# Patient Record
Sex: Female | Born: 1955 | Race: Black or African American | Hispanic: No | Smoking: Former smoker
Health system: Southern US, Community
[De-identification: ages and names within clinical notes are randomized; demographics above are authoritative.]

## PROBLEM LIST (undated history)

## (undated) DIAGNOSIS — F17200 Nicotine dependence, unspecified, uncomplicated: Secondary | ICD-10-CM

## (undated) DIAGNOSIS — M1990 Unspecified osteoarthritis, unspecified site: Secondary | ICD-10-CM

## (undated) DIAGNOSIS — M4800 Spinal stenosis, site unspecified: Secondary | ICD-10-CM

## (undated) DIAGNOSIS — I10 Essential (primary) hypertension: Secondary | ICD-10-CM

## (undated) DIAGNOSIS — K635 Polyp of colon: Secondary | ICD-10-CM

## (undated) DIAGNOSIS — K219 Gastro-esophageal reflux disease without esophagitis: Secondary | ICD-10-CM

## (undated) DIAGNOSIS — M7989 Other specified soft tissue disorders: Secondary | ICD-10-CM

## (undated) DIAGNOSIS — M79609 Pain in unspecified limb: Secondary | ICD-10-CM

## (undated) DIAGNOSIS — E669 Obesity, unspecified: Secondary | ICD-10-CM

## (undated) HISTORY — DX: Spinal stenosis, site unspecified: M48.00

## (undated) HISTORY — DX: Obesity, unspecified: E66.9

## (undated) HISTORY — DX: Other specified soft tissue disorders: M79.89

## (undated) HISTORY — DX: Polyp of colon: K63.5

## (undated) HISTORY — DX: Gastro-esophageal reflux disease without esophagitis: K21.9

## (undated) HISTORY — DX: Unspecified osteoarthritis, unspecified site: M19.90

## (undated) HISTORY — DX: Essential (primary) hypertension: I10

## (undated) HISTORY — PX: POLYPECTOMY: SHX149

## (undated) HISTORY — DX: Pain in unspecified limb: M79.609

## (undated) HISTORY — DX: Nicotine dependence, unspecified, uncomplicated: F17.200

---

## 2007-12-02 HISTORY — PX: COLONOSCOPY: SHX174

## 2008-03-23 ENCOUNTER — Other Ambulatory Visit: Admission: RE | Admit: 2008-03-23 | Discharge: 2008-03-23 | Payer: Self-pay | Admitting: Family Medicine

## 2008-03-24 ENCOUNTER — Ambulatory Visit: Payer: Self-pay | Admitting: Family Medicine

## 2008-04-11 ENCOUNTER — Ambulatory Visit: Payer: Self-pay | Admitting: Internal Medicine

## 2008-04-11 ENCOUNTER — Encounter: Payer: Self-pay | Admitting: Internal Medicine

## 2008-04-13 ENCOUNTER — Ambulatory Visit (HOSPITAL_COMMUNITY): Admission: RE | Admit: 2008-04-13 | Discharge: 2008-04-13 | Payer: Self-pay | Admitting: Family Medicine

## 2008-04-18 ENCOUNTER — Encounter: Payer: Self-pay | Admitting: Internal Medicine

## 2008-04-18 ENCOUNTER — Ambulatory Visit: Payer: Self-pay | Admitting: Internal Medicine

## 2008-04-18 LAB — HM COLONOSCOPY: HM Colonoscopy: 7

## 2008-04-20 ENCOUNTER — Encounter: Payer: Self-pay | Admitting: Internal Medicine

## 2009-03-30 ENCOUNTER — Ambulatory Visit: Payer: Self-pay | Admitting: Family Medicine

## 2009-07-20 ENCOUNTER — Ambulatory Visit: Payer: Self-pay | Admitting: Family Medicine

## 2009-07-31 ENCOUNTER — Emergency Department (HOSPITAL_COMMUNITY): Admission: EM | Admit: 2009-07-31 | Discharge: 2009-07-31 | Payer: Self-pay | Admitting: Family Medicine

## 2009-08-07 ENCOUNTER — Ambulatory Visit: Payer: Self-pay | Admitting: Family Medicine

## 2009-08-16 ENCOUNTER — Ambulatory Visit: Payer: Self-pay | Admitting: Family Medicine

## 2009-10-11 ENCOUNTER — Ambulatory Visit: Payer: Self-pay | Admitting: Family Medicine

## 2009-11-16 ENCOUNTER — Ambulatory Visit: Payer: Self-pay | Admitting: Family Medicine

## 2010-12-22 ENCOUNTER — Encounter: Payer: Self-pay | Admitting: Neurosurgery

## 2011-05-10 ENCOUNTER — Encounter: Payer: Self-pay | Admitting: Internal Medicine

## 2011-06-09 ENCOUNTER — Emergency Department (HOSPITAL_COMMUNITY): Payer: Self-pay

## 2011-06-09 ENCOUNTER — Emergency Department (HOSPITAL_COMMUNITY)
Admission: EM | Admit: 2011-06-09 | Discharge: 2011-06-09 | Disposition: A | Discharge status: Home / Self Care | Payer: Self-pay | Attending: Emergency Medicine | Admitting: Emergency Medicine

## 2011-06-09 DIAGNOSIS — R071 Chest pain on breathing: Secondary | ICD-10-CM | POA: Insufficient documentation

## 2011-06-09 DIAGNOSIS — M79609 Pain in unspecified limb: Secondary | ICD-10-CM

## 2011-06-09 DIAGNOSIS — Z79899 Other long term (current) drug therapy: Secondary | ICD-10-CM | POA: Insufficient documentation

## 2011-06-09 DIAGNOSIS — M7989 Other specified soft tissue disorders: Secondary | ICD-10-CM | POA: Insufficient documentation

## 2011-06-09 LAB — POCT I-STAT, CHEM 8
BUN: 23 mg/dL (ref 6–23)
Calcium, Ion: 1.11 mmol/L — ABNORMAL LOW (ref 1.12–1.32)
Chloride: 106 meq/L (ref 96–112)
Creatinine, Ser: 1 mg/dL (ref 0.50–1.10)
Glucose, Bld: 112 mg/dL — ABNORMAL HIGH (ref 70–99)
HCT: 43 % (ref 36.0–46.0)
Hemoglobin: 14.6 g/dL (ref 12.0–15.0)
Potassium: 4.2 meq/L (ref 3.5–5.1)
Sodium: 140 mEq/L (ref 135–145)
TCO2: 25 mmol/L (ref 0–100)

## 2011-06-09 LAB — D-DIMER, QUANTITATIVE: D-Dimer, Quant: 0.35 ug{FEU}/mL (ref 0.00–0.48)

## 2011-06-18 ENCOUNTER — Encounter: Payer: Self-pay | Admitting: Internal Medicine

## 2011-06-24 ENCOUNTER — Emergency Department (HOSPITAL_COMMUNITY)
Admission: EM | Admit: 2011-06-24 | Discharge: 2011-06-24 | Disposition: A | Discharge status: Home / Self Care | Payer: Self-pay | Attending: Emergency Medicine | Admitting: Emergency Medicine

## 2011-06-24 DIAGNOSIS — M25473 Effusion, unspecified ankle: Secondary | ICD-10-CM | POA: Insufficient documentation

## 2011-06-24 DIAGNOSIS — L03119 Cellulitis of unspecified part of limb: Secondary | ICD-10-CM | POA: Insufficient documentation

## 2011-06-24 DIAGNOSIS — M7989 Other specified soft tissue disorders: Secondary | ICD-10-CM | POA: Insufficient documentation

## 2011-06-24 DIAGNOSIS — J45909 Unspecified asthma, uncomplicated: Secondary | ICD-10-CM | POA: Insufficient documentation

## 2011-06-24 DIAGNOSIS — L02419 Cutaneous abscess of limb, unspecified: Secondary | ICD-10-CM | POA: Insufficient documentation

## 2011-06-24 DIAGNOSIS — R209 Unspecified disturbances of skin sensation: Secondary | ICD-10-CM | POA: Insufficient documentation

## 2011-06-24 DIAGNOSIS — L299 Pruritus, unspecified: Secondary | ICD-10-CM | POA: Insufficient documentation

## 2011-06-24 DIAGNOSIS — M25476 Effusion, unspecified foot: Secondary | ICD-10-CM | POA: Insufficient documentation

## 2011-07-01 ENCOUNTER — Emergency Department (HOSPITAL_COMMUNITY): Payer: Self-pay

## 2011-07-01 ENCOUNTER — Observation Stay (HOSPITAL_COMMUNITY)
Admission: EM | Admit: 2011-07-01 | Discharge: 2011-07-02 | Disposition: A | Discharge status: Home / Self Care | Payer: Self-pay | Attending: Emergency Medicine | Admitting: Emergency Medicine

## 2011-07-01 DIAGNOSIS — M7989 Other specified soft tissue disorders: Secondary | ICD-10-CM | POA: Insufficient documentation

## 2011-07-01 DIAGNOSIS — S8253XA Displaced fracture of medial malleolus of unspecified tibia, initial encounter for closed fracture: Principal | ICD-10-CM | POA: Insufficient documentation

## 2011-07-01 DIAGNOSIS — J45909 Unspecified asthma, uncomplicated: Secondary | ICD-10-CM | POA: Insufficient documentation

## 2011-07-01 DIAGNOSIS — R252 Cramp and spasm: Secondary | ICD-10-CM | POA: Insufficient documentation

## 2011-07-01 DIAGNOSIS — E119 Type 2 diabetes mellitus without complications: Secondary | ICD-10-CM | POA: Insufficient documentation

## 2011-07-01 DIAGNOSIS — I10 Essential (primary) hypertension: Secondary | ICD-10-CM | POA: Insufficient documentation

## 2011-07-01 DIAGNOSIS — W208XXA Other cause of strike by thrown, projected or falling object, initial encounter: Secondary | ICD-10-CM | POA: Insufficient documentation

## 2011-07-01 DIAGNOSIS — L02419 Cutaneous abscess of limb, unspecified: Secondary | ICD-10-CM | POA: Insufficient documentation

## 2011-07-01 DIAGNOSIS — Y998 Other external cause status: Secondary | ICD-10-CM | POA: Insufficient documentation

## 2011-07-01 DIAGNOSIS — Y92009 Unspecified place in unspecified non-institutional (private) residence as the place of occurrence of the external cause: Secondary | ICD-10-CM | POA: Insufficient documentation

## 2011-07-01 DIAGNOSIS — E669 Obesity, unspecified: Secondary | ICD-10-CM | POA: Insufficient documentation

## 2011-07-01 LAB — BASIC METABOLIC PANEL
Calcium: 9.4 mg/dL (ref 8.4–10.5)
Creatinine, Ser: 1 mg/dL (ref 0.50–1.10)
GFR calc Af Amer: >60 mL/min (ref >60)
GFR calc non Af Amer: 57 mL/min — ABNORMAL LOW (ref >60)
Sodium: 138 mEq/L (ref 135–145)

## 2011-07-01 LAB — CBC
MCH: 31.2 pg (ref 26.0–34.0)
MCHC: 34.8 g/dL (ref 30.0–36.0)
Platelets: 320 10*3/uL (ref 150–400)
RBC: 4.49 MIL/uL (ref 3.87–5.11)
RDW: 12.8 % (ref 11.5–15.5)

## 2011-07-01 LAB — DIFFERENTIAL
Basophils Relative: 1 % (ref 0–1)
Eosinophils Absolute: 0.2 10*3/uL (ref 0.0–0.7)
Eosinophils Relative: 3 % (ref 0–5)
Monocytes Relative: 8 % (ref 3–12)
Neutrophils Relative %: 59 % (ref 43–77)

## 2011-07-01 LAB — C-REACTIVE PROTEIN: CRP: 0.5 mg/dL — ABNORMAL LOW (ref <0.6)

## 2011-07-01 LAB — SEDIMENTATION RATE: Sed Rate: 10 mm/hr (ref 0–22)

## 2011-07-02 LAB — BASIC METABOLIC PANEL
BUN: 18 mg/dL (ref 6–23)
CO2: 25 mEq/L (ref 19–32)
Calcium: 9.1 mg/dL (ref 8.4–10.5)
GFR calc non Af Amer: >60 mL/min (ref >60)
Glucose, Bld: 167 mg/dL — ABNORMAL HIGH (ref 70–99)
Potassium: 4.2 mEq/L (ref 3.5–5.1)

## 2011-07-02 LAB — LIPID PANEL
HDL: 44 mg/dL (ref >39)
LDL Cholesterol: 89 mg/dL (ref 0–99)
Triglycerides: 111 mg/dL (ref <150)
VLDL: 22 mg/dL (ref 0–40)

## 2011-07-02 LAB — CBC
HCT: 40.4 % (ref 36.0–46.0)
Hemoglobin: 13.6 g/dL (ref 12.0–15.0)
MCH: 30.5 pg (ref 26.0–34.0)
MCHC: 33.7 g/dL (ref 30.0–36.0)
RBC: 4.46 MIL/uL (ref 3.87–5.11)

## 2011-07-02 LAB — URINALYSIS, ROUTINE W REFLEX MICROSCOPIC
Bilirubin Urine: NEGATIVE
Ketones, ur: NEGATIVE mg/dL
Nitrite: NEGATIVE
Urobilinogen, UA: 0.2 mg/dL (ref 0.0–1.0)

## 2011-07-02 LAB — HEMOGLOBIN A1C: Hgb A1c MFr Bld: 7.3 % — ABNORMAL HIGH (ref <5.7)

## 2011-07-06 NOTE — Discharge Summary (Signed)
[name], [name]         ACCOUNT NO.:  [id number]  MEDICAL RECORD NO.:  [id number]  LOCATION:  5011                         FACILITY:  [facility]  PHYSICIAN:  Isidor Holts, M.D.  DATE OF BIRTH:  November 21, 1956  DATE OF ADMISSION:  07/01/2011 DATE OF DISCHARGE:  07/02/2011                              DISCHARGE SUMMARY   PRIMARY MD:  Sharlot Gowda, MD  DISCHARGE DIAGNOSES: 1. Avulsion fracture, medial malleolus left leg. 2. Mild left lower extremity cellulitis. 3. Bronchial asthma. 4. Obesity. 5. Newly-diagnosed type 2 diabetes mellitus. 6. Hypertension.  DISCHARGE MEDICATIONS: 1. Bactrim DS 1 tablet p.o. b.i.d. for 7 days. 2. Lisinopril 20 mg p.o. daily. 3. Metformin 500 mg p.o. b.i.d. 4. Vicodin (5/325) 1-2 tablets p.o. p.r.n. q.4 hourly for pain.  A     total of 42 pills have been dispensed.  Note:  Doxycycline has been discontinued.  PROCEDURES:  X-ray left ankle July 01, 2011.  This showed evulsion fracture from the inferior tip of the medial malleolus overlying soft tissue swelling suggest acute.  CONSULTATIONS:  Dr. Toni Arthurs, orthopedic surgeon.  PATIENT HISTORY:  As in H and P notes of July 01, 2011, dictated by Dr. Jeoffrey Massed.  However, in brief, this is a 55 year old female, with known history of bronchial asthma and morbid obesity, who presents with progressive swelling and  pain of the left ankle.  She was seen fairly recently in the emergency department and was told that she did not have a DVT.  She was discharged on 7 days of doxycycline, for possible cellulitis.  However this has continued to be progressive.  She re-presented to the emergency department.  X-ray of the ankle showed an avulsion fracture of the inferior tip of the left medial malleolus.  She was therefore admitted for further evaluation, investigation and management.  CLINICAL COURSE: 1. Mild left lower extremity cellulitis.  This was based on clinical     constellation of  signs, including swelling, pain, tenderness as well     as mild erythema.  The patient was commenced on intravenous     vancomycin.  However, by July 02, 2011, local inflammatory     phenomena had practically subsided.  She has been transitioned to     oral Bactrim DS, for a further 7 days of therapy.  2. Avulsion fracture, left medial malleolus.  This was an incidental     finding on x-ray done in the emergency department on July 01, 2011.     Dr. Victorino Dike, orthopedic surgeon kindly provided a consultation and     has recommended support, weightbearing as tolerated, as well as an     outpatient followup.  The patient has been placed on analgesics for     symptomatic control.  3. Bronchial asthma.  There were no issues referable to this.  4. Hypertension.  The patient was found to have elevated BP at the     time of presentation and by July 02, 2011, in spite of Norvasc, BP     was ranging between 156/61 to 186/74 mmHg.  She has been placed on     lisinopril accordingly.  5. Type 2 diabetes mellitus.  This is a new diagnosis.  The patient     was found to have elevated blood glucose of the order of 211 at the     time of presentation.  Hemoglobin A1c was 7.3 consistent with ADA     criteria for diagnosis of diabetes mellitus.  She has therefore been     diagnosed with type 2 diabetes mellitus, given dietary     recommendations, diabetic teaching and commenced on metformin in an     initial dose of 500 mg p.o. b.i.d.  Outpatient diabetic class has     been arranged.  DISPOSITION:  The patient as of July 02, 2011, felt considerably better, was very keen to go home.  There were no new issues.  She was therefore discharged accordingly.  ACTIVITY:  As tolerated.  Recommended to increase activity slowly.  DIET:  Heart healthy/carbohydrate modified.  FOLLOWUP INSTRUCTIONS:  The patient will follow up routinely with her primary MD per prior scheduled appointment.  In addition, she  will follow up with orthopedic surgeon Dr. Toni Arthurs, on a date to be determined.  Telephone number [phone].  The patient was instructed to call for an appointment and has verbalized understanding.  SPECIAL INSTRUCTIONS:  Diabetic teaching class has been estranged on outpatient basis.     Isidor Holts, M.D.     CO/MEDQ  D:  07/02/2011  T:  07/03/2011  Job:  [id number]  cc:   Sharlot Gowda, M.D. Toni Arthurs, MD  Electronically Signed by Isidor Holts M.D. on 07/06/2011 06:30:13 PM

## 2011-07-06 NOTE — Consult Note (Signed)
[name], [name]         ACCOUNT NO.:  [id number]  MEDICAL RECORD NO.:  [id number]  LOCATION:  5011                         FACILITY:  [facility]  PHYSICIAN:  Toni Arthurs, MD        DATE OF BIRTH:  [birth year]  DATE OF CONSULTATION:  07/02/2011 DATE OF DISCHARGE:  07/02/2011                                CONSULTATION   REASON FOR CONSULTATION:  Left ankle pain.  PRIMARY CARE PROVIDER:  Sharlot Gowda, MD  HISTORY OF PRESENT ILLNESS:  The patient is a 55 year old woman with past medical history significant for asthma and obesity who complains of left ankle pain and swelling.  She does not recall any specific injury aside from dropping a can of food on her left ankle.  She says that the ankle was sore at the lateral side of the ankle, but not so much on the medial side.  She urgently presented to the emergency room and had an ultrasound showing no signs of blood clot.  She points to the lateral ankle as the area of her worst pain.  This is worse when she stands or walks and is moderate pain that is sharp in the ankle.  It is less when she keeps it elevated.  She has not had any treatment to date.  She is admitted to the hospital for cellulitis of the left ankle.  She has never had any injury or surgery of this left ankle in the past.  PAST MEDICAL HISTORY:  Asthma and obesity.  PAST SURGICAL HISTORY:  None.  SOCIAL HISTORY:  The patient smokes.  She does not drink any alcohol. She is not working currently.  FAMILY HISTORY:  Positive for hypertension.  REVIEW OF SYSTEMS:  No recent fever, chills, nausea, vomiting, or change in her appetite.  REVIEW OF SYSTEMS:  As above and otherwise negative.  PHYSICAL EXAMINATION:  The patient is a well-nourished, well-developed woman in no apparent distress.  Alert and oriented x4.  Mood and affect normal.  Extraocular motions are intact.  Respirations are unlabored. Her left ankle is slightly swollen.  It is tender to palpation in  the tip of the medial malleolus.  She has palpable pulses and normal sensibility to light touch.  She has range of motion from neutral dorsiflexion to 45 degrees plantar flexion.  Strength is 5/5 in plantarflexion and dorsiflexion of the ankles.  It is intact.  There is no lymphadenopathy noted.  X-RAYS:  Three views of the left ankle are reviewed from the ER.  These show a tiny avulsion fracture from the tip of the medial malleolus and are otherwise normal.  ASSESSMENT:  Left ankle sprain.  PLAN:  Based on the patient's exam, her pain pattern, and x-rays were consistent with a left ankle sprain.  We will put her in a stroke brace and let her bear weight as tolerated.  She can wean out of this in about a week or so.  She can continue working on range of motion and strengthening.  She will follow up with her primary care doctor if these symptoms do not resolve in the next few weeks.     Toni Arthurs, MD     JH/MEDQ  D:  07/02/2011  T:  07/03/2011  Job:  [id number]  Electronically Signed by Jonny Ruiz Aquarius Latouche  on 07/06/2011 03:16:58 PM

## 2011-07-09 NOTE — H&P (Signed)
NAMEMarland Kitchen  [name], [name] NO.:  [id number]  MEDICAL RECORD NO.:  [id number]  LOCATION:  1857                         FACILITY:  [facility]  PHYSICIAN:  Jeoffrey Massed, MD    DATE OF BIRTH:  October 06, 1956  DATE OF ADMISSION:  07/01/2011 DATE OF DISCHARGE:                             HISTORY & PHYSICAL   PRIMARY CARE PRACTITIONER:  Sharlot Gowda, MD  CHIEF COMPLAINT:  Left leg swelling and pain.  HISTORY OF PRESENT ILLNESS:  Ms. [name] is a very pleasant 55- year-old female with a past medical history of obesity and bronchial asthma who claims that for the past month or so she has been having intermittent left ankle swelling.  Apparently, a month or so ago she dropped a can continuing food on the left ankle region.  Following that she has been having pain and swelling of the legs.  She claims that she presented to the ED earlier this month and was told that she did not have a clot in her leg and was discharged home with 7 days of doxycycline as they thought she might have a cellulitis.  However, she continued to have pain mostly in the top of ankle region.  Her swelling of the legs has worsened and she is now having erythema streaking up her left leg.  She denies any fever to me.  Denies any headache, shortness of breath, cough, abdominal pain, nausea, vomiting, diarrhea.  For the persistent ankle pain and swelling, she presented to the ED where x-rays of the ankle were done which shows an avulsion fracture of inferior tip of the medial malleolus with overlying soft tissue swelling suggesting acute age.  She is now been referred to the Hospitalist Service for management of cellulitic issues.  Please note that the ED physician has already spoken with orthopedic doctor on-call, Dr. Victorino Dike who will evaluate the patient either this evening or tomorrow morning.  ALLERGIES:  The patient denies any known drug allergies.  PAST MEDICAL HISTORY: 1. Asthma. 2.  Obesity.  PAST SURGICAL HISTORY:  None.  MEDICATIONS AT HOME:  Albuterol on a p.r.n. basis.  FAMILY HISTORY:  Noncontributory.  SOCIAL HISTORY:  The patient continues to smoke.  She recently quit but again restarted this last week.  She denies any other toxic habits.  REVIEW OF SYSTEMS:  A detailed review of 12 systems were done and these are negative except for ones noted in the HPI.  PHYSICAL EXAMINATION:  GENERAL:  Lying in bed, does not appear to be in any distress. VITAL SIGNS:  Temperature of 98.0, heart rate of 88, blood pressure 169/81, respiration of 20 and pulse ox 90% on room air. HEENT:  Atraumatic, normocephalic.  Pupils equally reactive to light and accommodation. NECK:  Supple. CHEST:  Bilaterally clear to auscultation. CARDIOVASCULAR:  Heart sounds are regular.  No murmurs heard. ABDOMEN:  Soft, nontender, nondistended. EXTREMITIES:  Her left leg does appear to be slightly erythematous when compared to the right leg.  This is extending up to her mid left leg. There is some mild edema along with this.  She does have some mild tenderness in the front of her ankles as well.  Her leg does not appear tense.  Her  sensation is intact, and she has 2+ pedal pulses bilaterally.  She has good capillary refill as well.  The left leg is not exquisitely tender as well. NEUROLOGIC:  The patient is awake, alert.  Speech is clear and she has no focal neurological deficit on exam.  LABORATORY DATA: 1. CBC shows a WBC of 8.4, hemoglobin of 14.0, hematocrit of 40.2 and     a platelet count of 320. 2. D-dimer 0.35. 3. BMET shows a sodium of 138, potassium of 4.0, chloride of 103,     bicarb of 25, glucose of 211, BUN of 20, creatinine of 1.0, and a     calcium of 9.4. 4. ESR is 10.  CRP is 0.5.  RADIOLOGICAL STUDIES:  X-ray of the left ankle shows avulsion fracture of the inferior tip of the medial malleolus.  Overlying soft tissue swelling suggest acute  age.  ASSESSMENT: 1. Left leg cellulitis. 2. Left ankle fracture awaiting orthopedic evaluation. 3. Mild uncontrolled hypertension. 4. Elevated glucose levels on chemistries questionable underlying     diabetes as well. 5. Asthma appear stable. 6. Morbid obesity.  PLAN: 1. The patient will be admitted to a regular medical bed. 2. IV vancomycin will be dosed per Pharmacy. 3. We will await all formal Orthopedic evaluation as noted in the HPI.     This has already been called per ED physician and I have actually     confirmed with them and talked to them as well. 4. Further plan will depend as the patient's clinical course evolves     and further recommendations obtained from Orthopedic. 5. DVT prophylaxis with Lovenox. 6. Code status full code.  Total time spent 45 minutes.     Jeoffrey Massed, MD     SG/MEDQ  D:  07/01/2011  T:  07/01/2011  Job:  [id number]  cc:   Sharlot Gowda, M.D.  Electronically Signed by Jeoffrey Massed  on 07/09/2011 08:27:07 AM

## 2011-07-23 ENCOUNTER — Encounter: Payer: Self-pay | Admitting: Family Medicine

## 2011-07-29 ENCOUNTER — Other Ambulatory Visit: Payer: Self-pay | Admitting: Internal Medicine

## 2011-08-02 ENCOUNTER — Emergency Department (HOSPITAL_COMMUNITY): Payer: Self-pay

## 2011-08-02 ENCOUNTER — Inpatient Hospital Stay (INDEPENDENT_AMBULATORY_CARE_PROVIDER_SITE_OTHER)
Admission: RE | Admit: 2011-08-02 | Discharge: 2011-08-02 | Disposition: A | Discharge status: Home / Self Care | Payer: Self-pay | Source: Ambulatory Visit | Attending: Family Medicine | Admitting: Family Medicine

## 2011-08-02 ENCOUNTER — Emergency Department (HOSPITAL_COMMUNITY)
Admission: EM | Admit: 2011-08-02 | Discharge: 2011-08-02 | Disposition: A | Discharge status: Home / Self Care | Payer: Self-pay | Attending: Emergency Medicine | Admitting: Emergency Medicine

## 2011-08-02 DIAGNOSIS — I10 Essential (primary) hypertension: Secondary | ICD-10-CM | POA: Insufficient documentation

## 2011-08-02 DIAGNOSIS — E119 Type 2 diabetes mellitus without complications: Secondary | ICD-10-CM | POA: Insufficient documentation

## 2011-08-02 DIAGNOSIS — E669 Obesity, unspecified: Secondary | ICD-10-CM | POA: Insufficient documentation

## 2011-08-02 DIAGNOSIS — M7989 Other specified soft tissue disorders: Secondary | ICD-10-CM | POA: Insufficient documentation

## 2011-08-02 DIAGNOSIS — J45909 Unspecified asthma, uncomplicated: Secondary | ICD-10-CM | POA: Insufficient documentation

## 2011-08-02 DIAGNOSIS — M25579 Pain in unspecified ankle and joints of unspecified foot: Secondary | ICD-10-CM | POA: Insufficient documentation

## 2011-08-02 DIAGNOSIS — M79609 Pain in unspecified limb: Secondary | ICD-10-CM | POA: Insufficient documentation

## 2011-08-11 ENCOUNTER — Emergency Department (HOSPITAL_COMMUNITY)
Admission: EM | Admit: 2011-08-11 | Discharge: 2011-08-11 | Disposition: A | Discharge status: Home / Self Care | Payer: Self-pay | Attending: Emergency Medicine | Admitting: Emergency Medicine

## 2011-08-11 ENCOUNTER — Emergency Department (HOSPITAL_COMMUNITY): Payer: Self-pay

## 2011-08-11 ENCOUNTER — Encounter (HOSPITAL_COMMUNITY): Payer: Self-pay | Admitting: Radiology

## 2011-08-11 DIAGNOSIS — R110 Nausea: Secondary | ICD-10-CM | POA: Insufficient documentation

## 2011-08-11 DIAGNOSIS — Z79899 Other long term (current) drug therapy: Secondary | ICD-10-CM | POA: Insufficient documentation

## 2011-08-11 DIAGNOSIS — M545 Low back pain, unspecified: Secondary | ICD-10-CM | POA: Insufficient documentation

## 2011-08-11 DIAGNOSIS — R1031 Right lower quadrant pain: Secondary | ICD-10-CM | POA: Insufficient documentation

## 2011-08-11 DIAGNOSIS — E119 Type 2 diabetes mellitus without complications: Secondary | ICD-10-CM | POA: Insufficient documentation

## 2011-08-11 DIAGNOSIS — I10 Essential (primary) hypertension: Secondary | ICD-10-CM | POA: Insufficient documentation

## 2011-08-11 LAB — URINALYSIS, ROUTINE W REFLEX MICROSCOPIC
Bilirubin Urine: NEGATIVE
Glucose, UA: NEGATIVE mg/dL
Ketones, ur: NEGATIVE mg/dL
Leukocytes, UA: NEGATIVE
Nitrite: NEGATIVE
Protein, ur: NEGATIVE mg/dL
Specific Gravity, Urine: 1.021 (ref 1.005–1.030)
Urobilinogen, UA: 0.2 mg/dL (ref 0.0–1.0)
pH: 5.5 (ref 5.0–8.0)

## 2011-08-11 LAB — COMPREHENSIVE METABOLIC PANEL WITH GFR
Albumin: 3.7 g/dL (ref 3.5–5.2)
Alkaline Phosphatase: 70 U/L (ref 39–117)
BUN: 19 mg/dL (ref 6–23)
Calcium: 9.4 mg/dL (ref 8.4–10.5)
Creatinine, Ser: 0.92 mg/dL (ref 0.50–1.10)
GFR calc Af Amer: >60 mL/min (ref >60)
Glucose, Bld: 161 mg/dL — ABNORMAL HIGH (ref 70–99)
Total Protein: 7.5 g/dL (ref 6.0–8.3)

## 2011-08-11 LAB — CBC
HCT: 39.8 % (ref 36.0–46.0)
Hemoglobin: 13.7 g/dL (ref 12.0–15.0)
MCH: 30.9 pg (ref 26.0–34.0)
MCHC: 34.4 g/dL (ref 30.0–36.0)
MCV: 89.8 fL (ref 78.0–100.0)
Platelets: 348 10*3/uL (ref 150–400)
RBC: 4.43 MIL/uL (ref 3.87–5.11)
RDW: 13 % (ref 11.5–15.5)
WBC: 9.9 10*3/uL (ref 4.0–10.5)

## 2011-08-11 LAB — DIFFERENTIAL
Basophils Absolute: 0.1 K/uL (ref 0.0–0.1)
Basophils Relative: 1 % (ref 0–1)
Eosinophils Absolute: 0.4 10*3/uL (ref 0.0–0.7)
Eosinophils Relative: 4 % (ref 0–5)
Lymphocytes Relative: 23 % (ref 12–46)
Lymphs Abs: 2.3 K/uL (ref 0.7–4.0)
Monocytes Absolute: 0.9 10*3/uL (ref 0.1–1.0)
Monocytes Relative: 9 % (ref 3–12)
Neutro Abs: 6.3 10*3/uL (ref 1.7–7.7)
Neutrophils Relative %: 64 % (ref 43–77)

## 2011-08-11 LAB — URINE MICROSCOPIC-ADD ON

## 2011-08-11 LAB — POCT I-STAT, CHEM 8
Chloride: 103 mEq/L (ref 96–112)
HCT: 38 % (ref 36.0–46.0)
Hemoglobin: 12.9 g/dL (ref 12.0–15.0)
Potassium: 4.1 mEq/L (ref 3.5–5.1)
Sodium: 138 mEq/L (ref 135–145)

## 2011-08-11 LAB — COMPREHENSIVE METABOLIC PANEL
ALT: 26 U/L (ref 0–35)
AST: 17 U/L (ref 0–37)
CO2: 28 mEq/L (ref 19–32)
Chloride: 102 mEq/L (ref 96–112)
GFR calc non Af Amer: >60 mL/min (ref >60)
Potassium: 3.9 mEq/L (ref 3.5–5.1)
Sodium: 140 mEq/L (ref 135–145)
Total Bilirubin: 0.4 mg/dL (ref 0.3–1.2)

## 2011-08-11 LAB — LIPASE, BLOOD: Lipase: 37 U/L (ref 11–59)

## 2011-08-11 MED ORDER — IOHEXOL 300 MG/ML  SOLN: 100.0000 mL | Freq: Once | INTRAMUSCULAR | Status: DC | PRN

## 2011-09-02 ENCOUNTER — Encounter: Payer: Self-pay | Admitting: Vascular Surgery

## 2011-09-03 ENCOUNTER — Other Ambulatory Visit: Payer: Self-pay | Admitting: Lab

## 2011-09-03 DIAGNOSIS — M7989 Other specified soft tissue disorders: Secondary | ICD-10-CM

## 2011-09-05 ENCOUNTER — Encounter: Payer: Self-pay | Admitting: Vascular Surgery

## 2011-09-08 ENCOUNTER — Ambulatory Visit (INDEPENDENT_AMBULATORY_CARE_PROVIDER_SITE_OTHER): Payer: Self-pay | Admitting: Vascular Surgery

## 2011-09-08 ENCOUNTER — Ambulatory Visit (INDEPENDENT_AMBULATORY_CARE_PROVIDER_SITE_OTHER): Payer: Self-pay | Admitting: *Deleted

## 2011-09-08 ENCOUNTER — Encounter: Payer: Self-pay | Admitting: Vascular Surgery

## 2011-09-08 VITALS — BP 182/73 | HR 85 | Resp 24 | Ht 67.0 in | Wt 235.0 lb

## 2011-09-08 DIAGNOSIS — M7989 Other specified soft tissue disorders: Secondary | ICD-10-CM

## 2011-09-08 NOTE — Progress Notes (Signed)
Leg pain, swelling

## 2011-09-08 NOTE — Progress Notes (Signed)
Subjective:     Patient ID: [name], female   DOB: 10-20-1956, 55 y.o.   MRN: [id number]  HPI this 55 year old female patient was referred by Dr. Allena Katz for evaluation of bilateral lower extremity edema. The patient fractured her left ankle in July of this year after dropping a can on her foot. Shortly thereafter she developed swelling in the left leg up to the midcalf. She denied any previous history of swelling, DVT, thrombophlebitis, stasis ulcers, or bleeding varicosities. She then developed swelling in the right leg from the midcalf distally. She had no history of trauma to the right leg. She had no chills and fever. She has not worn elastic compression stockings were elevated her legs. He does not take diuretics.  Review of Systems he complains of weight gain, leg discomfort with walking, asthma, arthritis, joint pain particularly her back, muscle pain, dyspnea on exertion, orthopnea, all other systems are negative and a complete review of systems  Past Medical History  Diagnosis Date  . Asthma   . Obesity   . Smoker   . Colonic polyp   . Pain in limb   . Swelling of limb     History  Substance Use Topics  . Smoking status: Current Everyday Smoker -- 0.2 packs/day    Types: Cigarettes  . Smokeless tobacco: Not on file  . Alcohol Use:     No family history on file.  No Known Allergies  Current outpatient prescriptions:Aspirin-Salicylamide-Caffeine (BC HEADACHE POWDER PO), Take 1 packet by mouth as needed.  , Disp: , Rfl: ;  HYDROcodone-acetaminophen (NORCO) 10-325 MG per tablet, Take 1 tablet by mouth every 6 (six) hours as needed.  , Disp: , Rfl: ;  ibuprofen (ADVIL,MOTRIN) 100 MG tablet, Take 100 mg by mouth every 6 (six) hours as needed.  , Disp: , Rfl: ;  LISINOPRIL PO, Take by mouth.  , Disp: , Rfl:  METFORMIN HCL PO, Take by mouth.  , Disp: , Rfl:   BP 182/73  Pulse 85  Resp 24  Ht 5\' 7"  (1.702 m)  Wt 235 lb (106.595 kg)  BMI 36.81 kg/m2  Body mass index  is 36.81 kg/(m^2).           Objective:   Physical Exam blood pressure 182/73 heart rate 85 respirations 24 Patient is an obese middle-aged female who is in no apparent distress she is alert and oriented x3 HEENT exam normal for age Lungs clear no rhonchi or wheezing Cardiovascular regular and no murmurs carotid pulses 3+ multiple bruits Abdomen obese no masses palpable Musculoskeletal 3 major deformities Neurologic exam normal Skin exam reveals edema from the midcalf distally bilaterally left worse than right with 2 cm of circumference discrepancy between the left and the right calf. No varicosities are noted. There is no evidence of ischemia. She has 3+ femoral and dorsalis pedis pulses palpable bilaterally.  Today I ordered venous duplex exam of both legs which are reviewed and interpreted. There is no DVT. There is no reflux in the deep system. There is no reflux in the superficial system.     Assessment:     Bilateral edema-etiology unknown no evidence of venous abnormality    Plan:    #1 elevate foot of the bed 2-3 inches.   #2 short leg elastic compression stockings 20-30 mm gradient Medical doctor to consider diuretic therapy No reason for further evaluation or treatment from vascular surgery

## 2011-09-17 NOTE — Procedures (Unsigned)
LOWER EXTREMITY VENOUS REFLUX EXAM  INDICATION:  Swelling.  EXAM:  Using color-flow imaging and pulse Doppler spectral analysis, the bilateral common femoral, superficial femoral, popliteal, posterior tibial, greater and lesser saphenous veins are evaluated.  There is no evidence suggesting deep venous insufficiency in the bilateral lower extremities.  The bilateral saphenofemoral junctions and bilateral nontortuous GSV's are competent.  The bilateral proximal short saphenous veins demonstrate competency.  GSV Diameter (used if found to be incompetent only)                                           Right    Left Proximal Greater Saphenous Vein           cm       cm Proximal-to-mid-thigh                     cm       cm Mid thigh                                 cm       cm Mid-distal thigh                          cm       cm Distal thigh                              cm       cm Knee                                      cm       cm  IMPRESSION:  The bilateral great saphenous, small saphenous, and bilateral deep system veins are competent.  ___________________________________________ Quita Skye. Hart Rochester, M.D.  CH/MEDQ  D:  09/10/2011  T:  09/10/2011  Job:  [id number]

## 2011-09-30 ENCOUNTER — Encounter: Payer: Self-pay | Admitting: Vascular Surgery

## 2011-10-13 ENCOUNTER — Encounter: Payer: Self-pay | Admitting: Surgery

## 2011-10-15 ENCOUNTER — Telehealth: Payer: Self-pay | Admitting: Family Medicine

## 2011-10-15 NOTE — Telephone Encounter (Signed)
FAXED

## 2011-12-15 ENCOUNTER — Encounter: Payer: Self-pay | Admitting: Family Medicine

## 2012-02-16 DIAGNOSIS — Z0271 Encounter for disability determination: Secondary | ICD-10-CM

## 2012-02-28 IMAGING — CR DG ANKLE COMPLETE 3+V*L*
3 series · 3 of 3 positions shown · non-contrast
Comparison: None.

CLINICAL DATA: Left ankle pain

LEFT ANKLE COMPLETE - 3+ VIEW

[t ankle joint ap left]
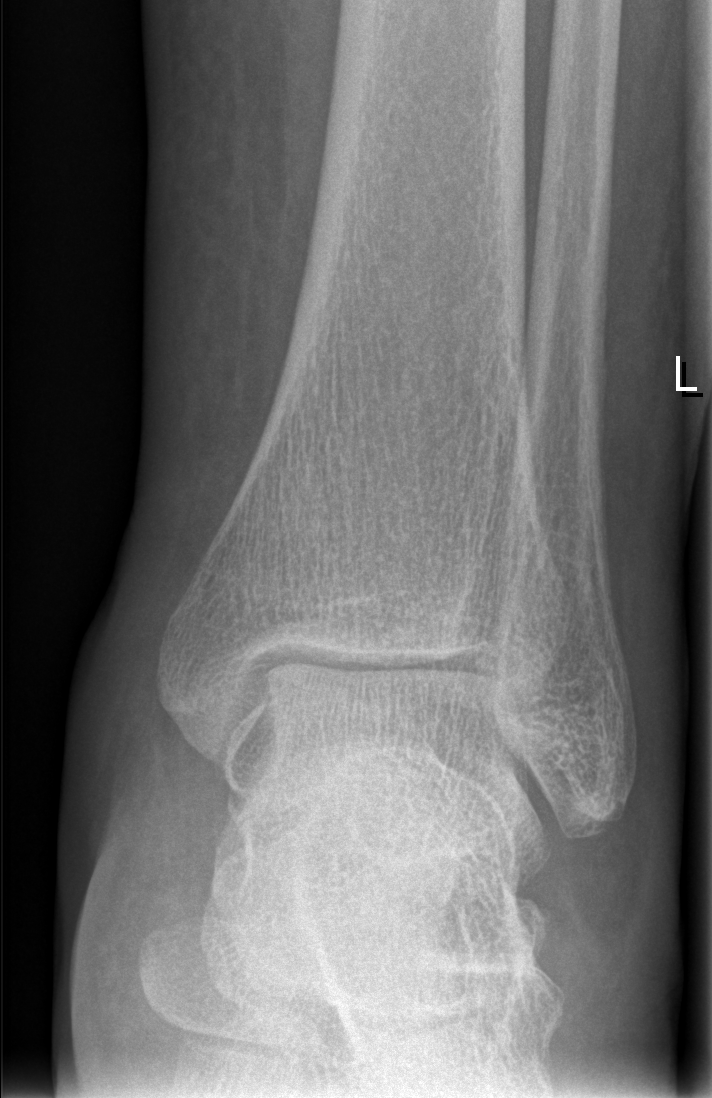

[t ankle joint oblique left]
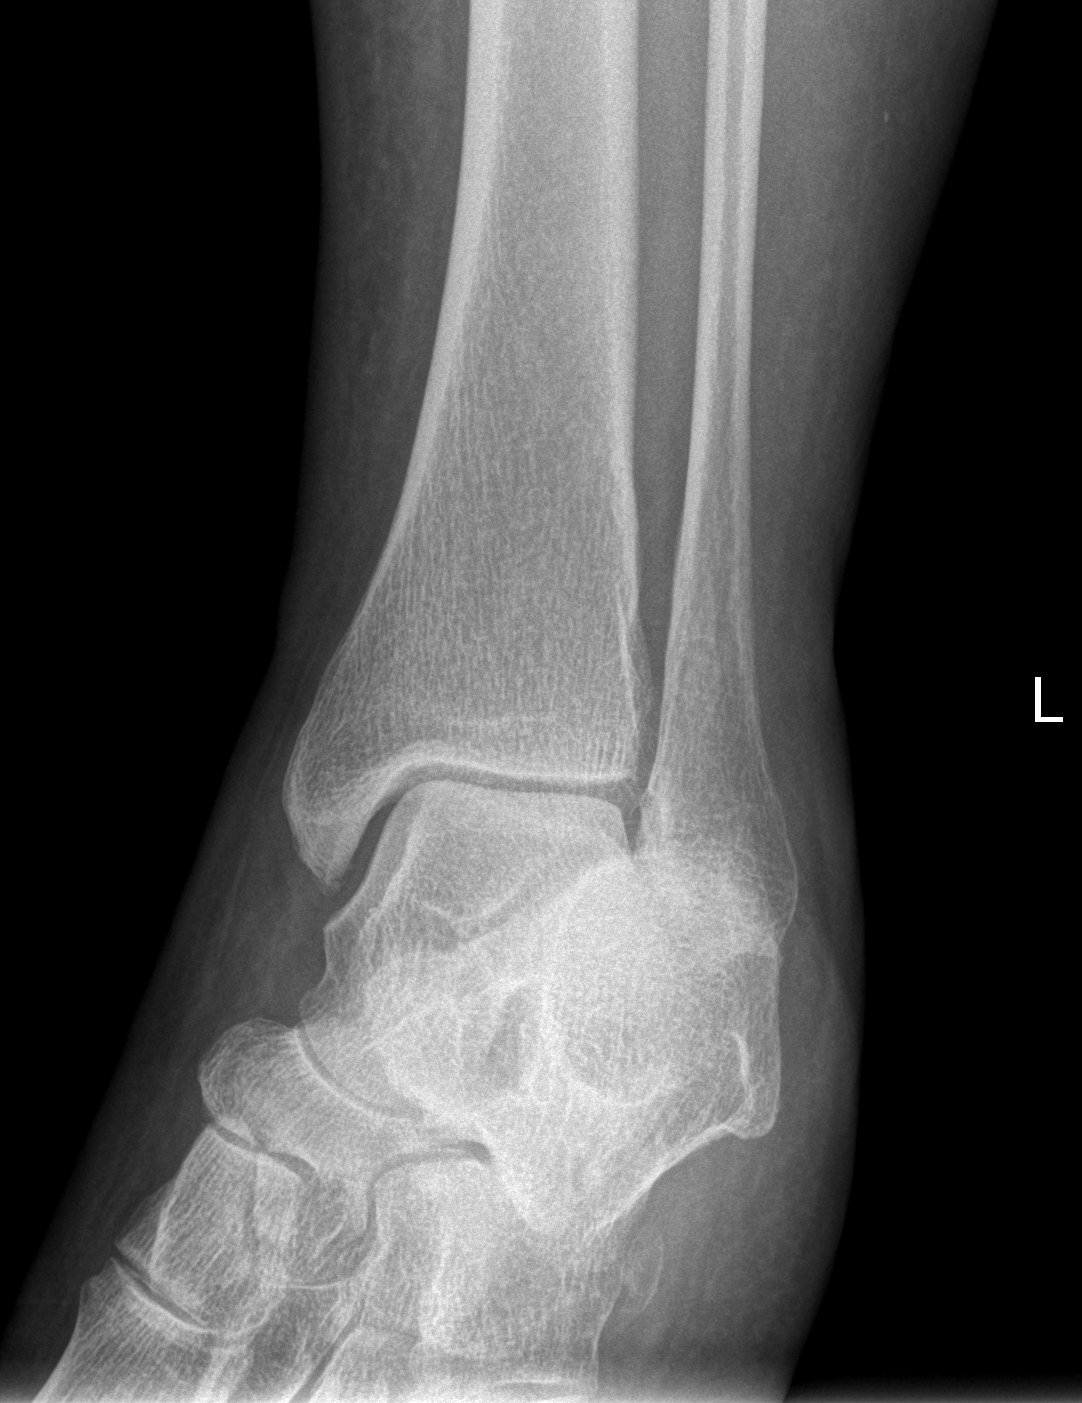

[t ankle joint lat left]
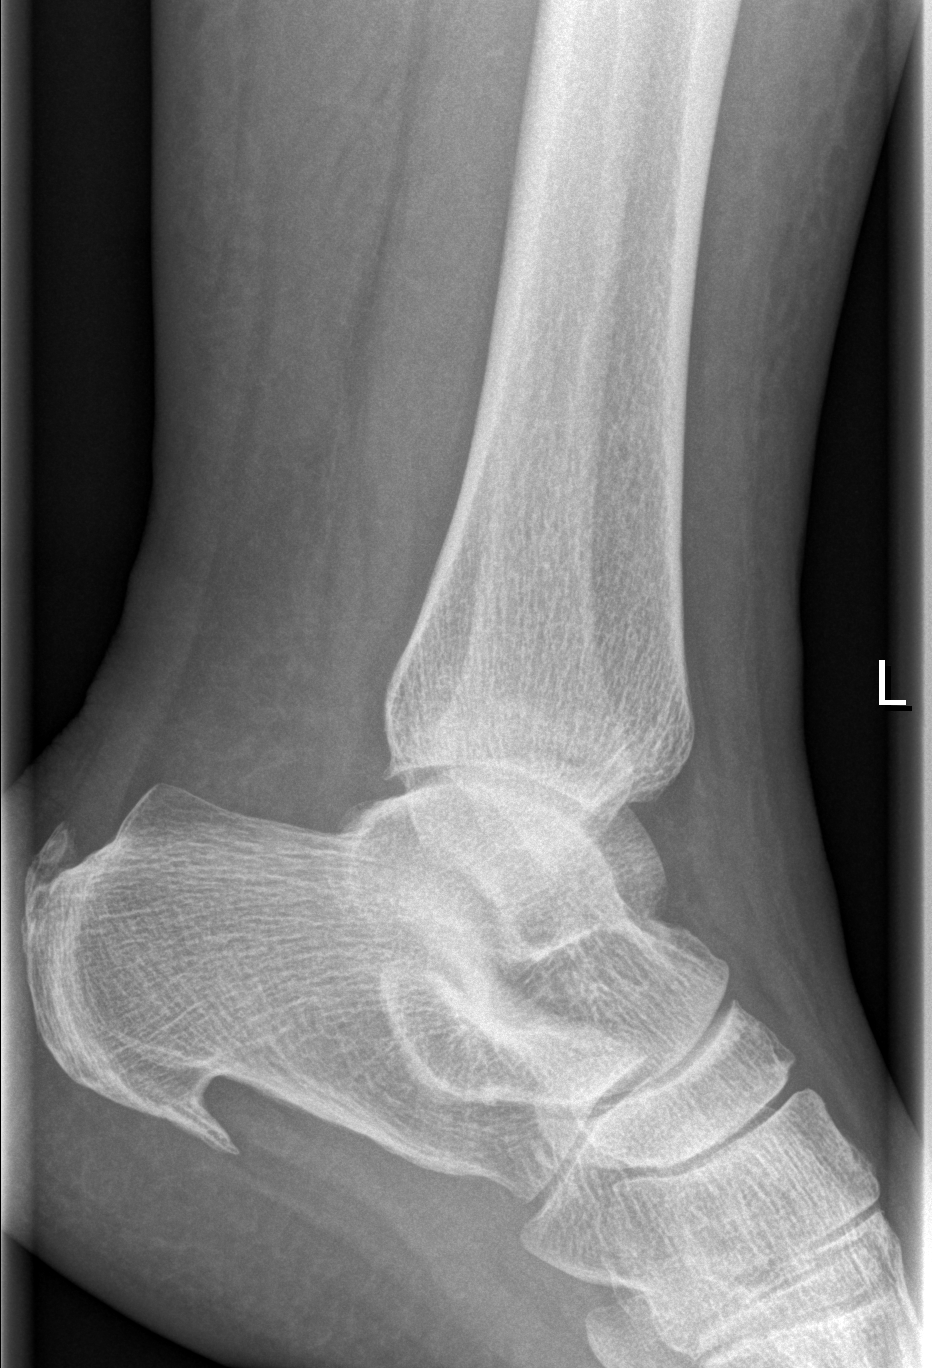

[3 of 3 positions shown; findings below may reference images not displayed]

FINDINGS: Mild degenerative changes of the ankle joint.  Subtle
avulsion fracture at the inferior tip of the medial malleolus is
noted.  Spurring at the posterior and inferior calcaneus.  Fibula
is intact.  Soft tissue swelling over the medial malleolus is
noted.
IMPRESSION: Avulsion fracture from the inferior tip of the medial malleolus.
Overlying soft tissue swelling suggest acute age.

## 2012-07-12 ENCOUNTER — Encounter: Payer: Self-pay | Admitting: Family Medicine

## 2012-07-12 ENCOUNTER — Ambulatory Visit (INDEPENDENT_AMBULATORY_CARE_PROVIDER_SITE_OTHER): Payer: Medicaid Other | Admitting: Family Medicine

## 2012-07-12 VITALS — BP 128/70 | HR 85 | Wt 246.0 lb

## 2012-07-12 DIAGNOSIS — E1169 Type 2 diabetes mellitus with other specified complication: Secondary | ICD-10-CM

## 2012-07-12 DIAGNOSIS — E669 Obesity, unspecified: Secondary | ICD-10-CM

## 2012-07-12 DIAGNOSIS — E1159 Type 2 diabetes mellitus with other circulatory complications: Secondary | ICD-10-CM | POA: Insufficient documentation

## 2012-07-12 DIAGNOSIS — I10 Essential (primary) hypertension: Secondary | ICD-10-CM

## 2012-07-12 DIAGNOSIS — I152 Hypertension secondary to endocrine disorders: Secondary | ICD-10-CM

## 2012-07-12 DIAGNOSIS — E119 Type 2 diabetes mellitus without complications: Secondary | ICD-10-CM

## 2012-07-12 DIAGNOSIS — G8929 Other chronic pain: Secondary | ICD-10-CM | POA: Insufficient documentation

## 2012-07-12 DIAGNOSIS — E118 Type 2 diabetes mellitus with unspecified complications: Secondary | ICD-10-CM | POA: Insufficient documentation

## 2012-07-12 DIAGNOSIS — M549 Dorsalgia, unspecified: Secondary | ICD-10-CM

## 2012-07-12 LAB — POCT GLYCOSYLATED HEMOGLOBIN (HGB A1C): Hemoglobin A1C: 6.9

## 2012-07-12 MED ORDER — HYDROCODONE-ACETAMINOPHEN 5-325 MG PO TABS: 1.0000 | ORAL_TABLET | Freq: Four times a day (QID) | ORAL | Status: DC | PRN

## 2012-07-12 NOTE — Progress Notes (Signed)
  Subjective:    Patient ID: [name], female    DOB: 02-Jun-1956, 56 y.o.   MRN: [id number]  HPI She is here after a several year absence. She lost her insurance and was going to [facility] [employer]. That particular place will be closed. She has a history of chronic back pain with previous neurosurgical evaluation. Epidural injections were recommended however she was not interested. She apparently has been declared disabled because of this. She does intermittently use pain medications stating using hydrocodone only 2 or 3 times per week and usually taking one half pill. Review of her pharmaceutical record indicates her last prescription was in January 2013. She did tend to ramble and change the subject quite often. She does have underlying diabetes and presently is on metformin. She does not check her blood sugars. Apparently she has had recent blood work done.   Review of Systems     Objective:   Physical Exam Alert and in no distress otherwise not examined       Assessment & Plan:   1. Diabetes mellitus  POCT glycosylated hemoglobin (Hb A1C)  2. Obesity (BMI 30-39.9)    3. Chronic back pain    4. Hypertension associated with diabetes     I will renew her hydrocodone. We will get records. She'll be set up for complete exam in roughly one month.

## 2012-08-06 ENCOUNTER — Encounter: Payer: Self-pay | Admitting: Internal Medicine

## 2012-08-13 ENCOUNTER — Ambulatory Visit (INDEPENDENT_AMBULATORY_CARE_PROVIDER_SITE_OTHER): Payer: Medicaid Other | Admitting: Family Medicine

## 2012-08-13 ENCOUNTER — Other Ambulatory Visit (HOSPITAL_COMMUNITY)
Admission: RE | Admit: 2012-08-13 | Discharge: 2012-08-13 | Disposition: A | Discharge status: Home / Self Care | Payer: Medicaid Other | Source: Ambulatory Visit | Attending: Family Medicine | Admitting: Family Medicine

## 2012-08-13 ENCOUNTER — Encounter: Payer: Self-pay | Admitting: Family Medicine

## 2012-08-13 VITALS — BP 130/80 | HR 80 | Ht 67.0 in | Wt 247.0 lb

## 2012-08-13 DIAGNOSIS — I152 Hypertension secondary to endocrine disorders: Secondary | ICD-10-CM

## 2012-08-13 DIAGNOSIS — E1159 Type 2 diabetes mellitus with other circulatory complications: Secondary | ICD-10-CM

## 2012-08-13 DIAGNOSIS — Z01419 Encounter for gynecological examination (general) (routine) without abnormal findings: Secondary | ICD-10-CM | POA: Insufficient documentation

## 2012-08-13 DIAGNOSIS — E119 Type 2 diabetes mellitus without complications: Secondary | ICD-10-CM

## 2012-08-13 DIAGNOSIS — Z0000 Encounter for general adult medical examination without abnormal findings: Secondary | ICD-10-CM

## 2012-08-13 DIAGNOSIS — Z23 Encounter for immunization: Secondary | ICD-10-CM

## 2012-08-13 DIAGNOSIS — I10 Essential (primary) hypertension: Secondary | ICD-10-CM

## 2012-08-13 DIAGNOSIS — Z86010 Personal history of colonic polyps: Secondary | ICD-10-CM

## 2012-08-13 DIAGNOSIS — G8929 Other chronic pain: Secondary | ICD-10-CM

## 2012-08-13 DIAGNOSIS — M549 Dorsalgia, unspecified: Secondary | ICD-10-CM

## 2012-08-13 DIAGNOSIS — E1169 Type 2 diabetes mellitus with other specified complication: Secondary | ICD-10-CM

## 2012-08-13 DIAGNOSIS — E669 Obesity, unspecified: Secondary | ICD-10-CM

## 2012-08-13 DIAGNOSIS — R319 Hematuria, unspecified: Secondary | ICD-10-CM

## 2012-08-13 DIAGNOSIS — F17200 Nicotine dependence, unspecified, uncomplicated: Secondary | ICD-10-CM

## 2012-08-13 LAB — COMPREHENSIVE METABOLIC PANEL
ALT: 12 U/L (ref 0–35)
Albumin: 4.4 g/dL (ref 3.5–5.2)
Alkaline Phosphatase: 51 U/L (ref 39–117)
CO2: 25 mEq/L (ref 19–32)
Glucose, Bld: 119 mg/dL — ABNORMAL HIGH (ref 70–99)
Potassium: 4.2 mEq/L (ref 3.5–5.3)
Sodium: 138 mEq/L (ref 135–145)
Total Protein: 7.5 g/dL (ref 6.0–8.3)

## 2012-08-13 LAB — CBC WITH DIFFERENTIAL/PLATELET
Hemoglobin: 13.3 g/dL (ref 12.0–15.0)
Lymphs Abs: 2.4 10*3/uL (ref 0.7–4.0)
Monocytes Relative: 8 % (ref 3–12)
Neutro Abs: 5 10*3/uL (ref 1.7–7.7)
Neutrophils Relative %: 61 % (ref 43–77)
Platelets: 399 10*3/uL (ref 150–400)
RBC: 4.3 MIL/uL (ref 3.87–5.11)
WBC: 8.2 10*3/uL (ref 4.0–10.5)

## 2012-08-13 LAB — LIPID PANEL
Cholesterol: 164 mg/dL (ref 0–200)
LDL Cholesterol: 88 mg/dL (ref 0–99)
Triglycerides: 153 mg/dL — ABNORMAL HIGH (ref <150)
VLDL: 31 mg/dL (ref 0–40)

## 2012-08-13 MED ORDER — TRAMADOL HCL 50 MG PO TBDP: 2.0000 | ORAL_TABLET | Freq: Four times a day (QID) | ORAL | Status: DC | PRN

## 2012-08-13 NOTE — Progress Notes (Signed)
Subjective:    Patient ID: [name], female    DOB: [birth year], 56 y.o.   MRN: [id number]  HPI She is here for complete examination. She has a long history of difficulty with back pain and does use Vicodin usually one or 2 per week for the pain. She says that this does help but causes GI distress. She has tried tramadol in the past but only 50 mg dosing. She continues on metformin. She does not have a glucometer .Her exercise is quite minimal and she blames this on having the for the pain medication and her back pain. Her last menses was several years ago. He does complain of a long history of urinary frequency and a previous history of UTIs. She does smoke and is not currently ready to quit. She has a history of colonic polyps and did miss getting a colonoscopy last year due to lack of insurance. Her last Pap was 2009. She had a mammogram in 2012.   Review of Systems Negative except as above    Objective:   Physical Exam BP 130/80  Pulse 80  Ht 5\' 7"  (1.702 m)  Wt 247 lb (112.038 kg)  BMI 38.69 kg/m2  General Appearance:    Alert, cooperative, no distress, appears stated age  Head:    Normocephalic, without obvious abnormality, atraumatic  Eyes:    PERRL, conjunctiva/corneas clear, EOM's intact, fundi    benign  Ears:    Normal TM's and external ear canals  Nose:   Nares normal, mucosa normal, no drainage or sinus   tenderness  Throat:   Lips, mucosa, and tongue normal; teeth and gums normal  Neck:   Supple, no lymphadenopathy;  thyroid:  no   enlargement/tenderness/nodules; no carotid   bruit or JVD  Back:    Spine nontender, no curvature, ROM normal, no CVA     tenderness  Lungs:     Clear to auscultation bilaterally without wheezes, rales or     ronchi; respirations unlabored  Chest Wall:    No tenderness or deformity   Heart:    Regular rate and rhythm, S1 and S2 normal, no murmur, rub   or gallop  Breast Exam:    No tenderness, masses, or nipple discharge or  inversion.      No axillary lymphadenopathy  Abdomen:     Soft, non-tender, nondistended, normoactive bowel sounds,    no masses, no hepatosplenomegaly  Genitalia:    Normal external genitalia without lesions.  BUS and vagina normal; cervix without lesions, or cervical motion tenderness. No abnormal vaginal discharge.  Uterus and adnexa not enlarged, nontender, no masses.  Pap performed but cervical os difficult to identify due to her obesity      Extremities:   No clubbing, cyanosis or edema  Pulses:   2+ and symmetric all extremities  Skin:   Skin color, texture, turgor normal, no rashes or lesions  Lymph nodes:   Cervical, supraclavicular, and axillary nodes normal  Neurologic:   CNII-XII intact, normal strength, sensation and gait; reflexes 2+ and symmetric throughout          Psych:   Normal mood, affect, hygiene and grooming.         Assessment & Plan:   1. Routine general medical examination at a health care facility  POCT Urinalysis Dipstick, CBC with Differential, Comprehensive metabolic panel, Lipid panel, Tdap vaccine greater than or equal to 7yo IM, Pneumococcal polysaccharide vaccine 23-valent greater than or equal to 2yo subcutaneous/IM,  Cytology - PAP  2. History of colonic polyps  [facility] COLONOSCOPY  3. Chronic back pain  TraMADol HCl 50 MG TBDP  4. Diabetes mellitus  Hepatitis B vaccine adult IM, Pneumococcal polysaccharide vaccine 23-valent greater than or equal to 2yo subcutaneous/IM, Ambulatory referral to Ophthalmology  5. Hypertension associated with diabetes    6. Obesity (BMI 30-39.9)    7. Current smoker    8. Hematuria  Aerobic culture   she is to try the tramadol for her pain relief to avoid codeine and its GI effects. She was updated on all of her immunizations. She did refuse to have the flu shot. Discussed smoking cessation with her however at this point she is not truly ready. Recommend she return here at her convenience for further discussion. Microscopic showed  scattered red cells as well as multiple epithelial cells.

## 2012-08-15 LAB — CULTURE, URINE COMPREHENSIVE: Organism ID, Bacteria: NO GROWTH

## 2012-08-26 ENCOUNTER — Encounter: Payer: Self-pay | Admitting: Internal Medicine

## 2012-09-10 ENCOUNTER — Other Ambulatory Visit: Payer: Self-pay

## 2012-09-10 ENCOUNTER — Other Ambulatory Visit (INDEPENDENT_AMBULATORY_CARE_PROVIDER_SITE_OTHER): Payer: Medicaid Other

## 2012-09-10 DIAGNOSIS — Z23 Encounter for immunization: Secondary | ICD-10-CM

## 2012-09-27 ENCOUNTER — Encounter: Payer: Self-pay | Admitting: Family Medicine

## 2012-09-27 ENCOUNTER — Other Ambulatory Visit: Payer: Self-pay

## 2012-09-27 ENCOUNTER — Ambulatory Visit (INDEPENDENT_AMBULATORY_CARE_PROVIDER_SITE_OTHER): Payer: Medicaid Other | Admitting: Family Medicine

## 2012-09-27 VITALS — BP 130/70 | HR 82 | Wt 247.0 lb

## 2012-09-27 DIAGNOSIS — E119 Type 2 diabetes mellitus without complications: Secondary | ICD-10-CM

## 2012-09-27 DIAGNOSIS — G8929 Other chronic pain: Secondary | ICD-10-CM

## 2012-09-27 DIAGNOSIS — E669 Obesity, unspecified: Secondary | ICD-10-CM

## 2012-09-27 DIAGNOSIS — M549 Dorsalgia, unspecified: Secondary | ICD-10-CM

## 2012-09-27 DIAGNOSIS — F17200 Nicotine dependence, unspecified, uncomplicated: Secondary | ICD-10-CM

## 2012-09-27 DIAGNOSIS — J45909 Unspecified asthma, uncomplicated: Secondary | ICD-10-CM

## 2012-09-27 MED ORDER — HYDROCODONE-ACETAMINOPHEN 5-325 MG PO TABS: 1.0000 | ORAL_TABLET | Freq: Four times a day (QID) | ORAL | Status: DC | PRN

## 2012-09-27 MED ORDER — ALBUTEROL SULFATE HFA 108 (90 BASE) MCG/ACT IN AERS: 2.0000 | INHALATION_SPRAY | Freq: Four times a day (QID) | RESPIRATORY_TRACT | Status: DC | PRN

## 2012-09-27 NOTE — Progress Notes (Signed)
  Subjective:    Patient ID: [name], female    DOB: July 23, 1956, 56 y.o.   MRN: [id number]  HPI She is here for consultation. Apparently she had difficulty getting her glucometer. She continues to complain of back pain which apparently is why she is now on disability. Review his record indicates that an epidural was recommended however she refused. She has not been involved in any back rehabilitation program. He states the tramadol did not help very much. She would like to use Vicodin stating that she would probably only use it once or twice per day. She also states she would like to lose weight. She does continue to smoke and has difficulty with asthma. She wants a refill on her Pro-air stating she thinks this works better than the other albuterol formulation this.  Review of Systems     Objective:   Physical Exam Alert and in no distress otherwise not examined       Assessment & Plan:   1. Back pain, chronic  Ambulatory referral to Physical Therapy  2. Asthma  albuterol (PROVENTIL HFA;VENTOLIN HFA) 108 (90 BASE) MCG/ACT inhaler  3. Current smoker    4. Obesity (BMI 30-39.9)     I discussed in detail that her refusal to have the epidural was not in her best interest. This could potentially alleviate all of her pain. Also discussed her smoking and the fact that she needs albuterol. She's presses a desire to lose weight and quit smoking but has done very little to accomplish this. I will give Vicodin #60 and monitor her closely. If she continues to need this medication I might possibly refer back to her back surgeon or to the pain clinic.

## 2012-09-27 NOTE — Telephone Encounter (Signed)
Called in hydrocodone per [employer]

## 2012-09-28 ENCOUNTER — Ambulatory Visit: Payer: Medicaid Other | Admitting: Dietician

## 2012-10-05 ENCOUNTER — Ambulatory Visit: Payer: Medicaid Other | Attending: Family Medicine | Admitting: Physical Therapy

## 2012-10-05 DIAGNOSIS — M545 Low back pain, unspecified: Secondary | ICD-10-CM | POA: Insufficient documentation

## 2012-10-05 DIAGNOSIS — R293 Abnormal posture: Secondary | ICD-10-CM | POA: Insufficient documentation

## 2012-10-05 DIAGNOSIS — IMO0001 Reserved for inherently not codable concepts without codable children: Secondary | ICD-10-CM | POA: Insufficient documentation

## 2012-10-05 DIAGNOSIS — M2569 Stiffness of other specified joint, not elsewhere classified: Secondary | ICD-10-CM | POA: Insufficient documentation

## 2012-10-13 ENCOUNTER — Encounter: Payer: Medicaid Other | Admitting: Physical Therapy

## 2012-10-21 ENCOUNTER — Ambulatory Visit: Payer: Medicaid Other | Admitting: Physical Therapy

## 2012-10-25 ENCOUNTER — Encounter: Payer: Medicaid Other | Admitting: Physical Therapy

## 2012-11-01 ENCOUNTER — Ambulatory Visit: Payer: Medicaid Other | Attending: Family Medicine | Admitting: Physical Therapy

## 2012-11-01 DIAGNOSIS — M545 Low back pain, unspecified: Secondary | ICD-10-CM | POA: Insufficient documentation

## 2012-11-01 DIAGNOSIS — IMO0001 Reserved for inherently not codable concepts without codable children: Secondary | ICD-10-CM | POA: Insufficient documentation

## 2012-11-01 DIAGNOSIS — M2569 Stiffness of other specified joint, not elsewhere classified: Secondary | ICD-10-CM | POA: Insufficient documentation

## 2012-11-01 DIAGNOSIS — R293 Abnormal posture: Secondary | ICD-10-CM | POA: Insufficient documentation

## 2012-11-04 ENCOUNTER — Ambulatory Visit: Payer: Medicaid Other | Admitting: Physical Therapy

## 2012-11-25 ENCOUNTER — Telehealth: Payer: Self-pay | Admitting: Family Medicine

## 2012-11-25 NOTE — Telephone Encounter (Signed)
Please call patient, she is having st, sinus draininage, wants to know what otc she can take with her current medications. She has Nyquil capsules at home and has taken coricidin in the past

## 2012-11-25 NOTE — Telephone Encounter (Signed)
Let her know that she can take any medicine for short period of time. Recommend Afrin nasal spray at night only if she is having a lot of nasal congestion.

## 2012-11-25 NOTE — Telephone Encounter (Signed)
PT INFORMED SHE CAN TAKE AN OTC FOR SHORT PERIOD OF TIME PT VERBALIZED UNDERSTANDING

## 2012-11-29 ENCOUNTER — Ambulatory Visit (INDEPENDENT_AMBULATORY_CARE_PROVIDER_SITE_OTHER): Payer: Medicaid Other | Admitting: Medical

## 2012-11-29 ENCOUNTER — Encounter: Payer: Self-pay | Admitting: Medical

## 2012-11-29 VITALS — BP 118/70 | HR 80 | Temp 98.1°F | Resp 16 | Wt 200.0 lb

## 2012-11-29 DIAGNOSIS — J45909 Unspecified asthma, uncomplicated: Secondary | ICD-10-CM

## 2012-11-29 DIAGNOSIS — R062 Wheezing: Secondary | ICD-10-CM

## 2012-11-29 DIAGNOSIS — B009 Herpesviral infection, unspecified: Secondary | ICD-10-CM

## 2012-11-29 DIAGNOSIS — B001 Herpesviral vesicular dermatitis: Secondary | ICD-10-CM

## 2012-11-29 MED ORDER — ACYCLOVIR 400 MG PO TABS: 400.0000 mg | ORAL_TABLET | Freq: Three times a day (TID) | ORAL | Status: DC

## 2012-11-29 MED ORDER — LEVOFLOXACIN 500 MG PO TABS: 500.0000 mg | ORAL_TABLET | Freq: Every day | ORAL | Status: DC

## 2012-11-29 NOTE — Progress Notes (Signed)
Subjective:  [name] is a 56 y.o. female who presents for 5 day hx/o cough, congestion in head and chest, hoarse voice, can't taste food, some wheezing and SOB.  Denies fever, nausea, vomiting, diarrhea, sore throat.   Using nebulized albuterol which helps.  Feels like she needs antibiotic.  No other aggravating or relieving factors.  No other c/o.  The following portions of the patient's history were reviewed and updated as appropriate: allergies, current medications, past family history, past medical history, past social history, past surgical history and problem list.  Past Medical History  Diagnosis Date  . Asthma   . Obesity   . Smoker   . Colonic polyp   . Pain in limb   . Swelling of limb     Objective:   Filed Vitals:   11/29/12 1637  BP: 118/70  Pulse: 80  Temp: 98.1 F (36.7 C)  Resp: 16    General appearance: Alert, WD/WN, no distress, hoarse voice, somewhat ill appearing                             Skin: warm, no rash, no diaphoresis                           Head: no sinus tenderness                            Eyes: conjunctiva normal, corneas clear, PERRLA                            Ears: pearly TMs, external ear canals normal                          Nose: septum midline, turbinates swollen, with erythema and clear discharge             Mouth/throat: MMM, tongue normal, mild pharyngeal erythema                           Neck: supple, no adenopathy, no thyromegaly, nontender                          Heart: RRR, normal S1, S2, no murmurs                         Lungs: +bronchial breath sounds, +scattered rhonchi, +few wheezes, no rales                Extremities: no edema, nontender     Assessment and Plan:   Encounter Diagnoses  Name Primary?  Marland Kitchen Asthmatic bronchitis Yes  . Wheezing   . Cold sore    Begin Levaquin, rest, hydrate well, c/t albuterol by nebulizer or HFA q 4- 6 hours prn.  Suggested symptomatic OTC remedies for cough and  congestion.    Tylenol OTC for fever and malaise.  Call/return in 2-3 days if symptoms are worse or not improving.   Cold sore - sciprt for acyclovir, discussed proper use and diagnosis and symptoms of oral cold sores

## 2012-12-02 ENCOUNTER — Telehealth: Payer: Self-pay | Admitting: Family Medicine

## 2012-12-02 ENCOUNTER — Other Ambulatory Visit: Payer: Self-pay | Admitting: Family Medicine

## 2012-12-02 NOTE — Telephone Encounter (Signed)
IS THIS OK 

## 2012-12-02 NOTE — Telephone Encounter (Signed)
SHANE DR.LALONDE WANTS YOU TO TAKE CARE OF THIS

## 2012-12-02 NOTE — Telephone Encounter (Signed)
Call [facility] on this and ask what he wants to do

## 2012-12-03 ENCOUNTER — Ambulatory Visit: Payer: Medicaid Other | Admitting: Medical

## 2012-12-03 NOTE — Telephone Encounter (Signed)
CALLED PT TO SCHED APPT TO COME IN TODAY. SHE WILL BE IN AT 3:45

## 2012-12-03 NOTE — Telephone Encounter (Signed)
Have her come in today.  She was a bit wheezy the last time I listened to her lungs.

## 2012-12-07 ENCOUNTER — Ambulatory Visit: Payer: Medicaid Other | Admitting: Medical

## 2012-12-13 ENCOUNTER — Ambulatory Visit (INDEPENDENT_AMBULATORY_CARE_PROVIDER_SITE_OTHER): Payer: Medicare Other | Admitting: Medical

## 2012-12-13 ENCOUNTER — Other Ambulatory Visit: Payer: Self-pay | Admitting: Family Medicine

## 2012-12-13 ENCOUNTER — Encounter: Payer: Self-pay | Admitting: Medical

## 2012-12-13 VITALS — BP 128/70 | HR 68 | Temp 98.3°F | Resp 18 | Wt 250.0 lb

## 2012-12-13 DIAGNOSIS — J45909 Unspecified asthma, uncomplicated: Secondary | ICD-10-CM

## 2012-12-13 DIAGNOSIS — R490 Dysphonia: Secondary | ICD-10-CM

## 2012-12-13 DIAGNOSIS — K219 Gastro-esophageal reflux disease without esophagitis: Secondary | ICD-10-CM

## 2012-12-13 DIAGNOSIS — F17200 Nicotine dependence, unspecified, uncomplicated: Secondary | ICD-10-CM

## 2012-12-13 MED ORDER — VARENICLINE TARTRATE 0.5 MG X 11 & 1 MG X 42 PO MISC: ORAL | Status: DC

## 2012-12-13 NOTE — Telephone Encounter (Signed)
Message copied by Janeice Robinson on Mon Dec 13, 2012  4:03 PM ------      Message from: Jac Canavan      Created: Mon Dec 13, 2012  3:06 PM       Call in chantix

## 2012-12-13 NOTE — Telephone Encounter (Signed)
I called a RX for Chantix to the patients pharmacy per Crosby Oyster PA-C. CLS

## 2012-12-13 NOTE — Progress Notes (Signed)
Subjective: Here for recheck.   i saw her on 11/29/12 for asthmatic bronchitis.   Treated with Levaquin. She is much better.  Wants to quit tobacco.  Wants to try chantix.  She notes chronic hoarseness since free [facility] drugs 10+ years ago.  Never has had ENT consult.  She does have GERD, recently flared up but using zantac OTC which helps.  No new c/o.   Past Medical History  Diagnosis Date  . Asthma   . Obesity   . Smoker   . Colonic polyp   . Pain in limb   . Swelling of limb     Objective: Gen: wd, wn, nad Skin: warm, dry Neck: supple, nontender, no mass, no thyromegaly Heart: RRR, normal S1, S2, no murmurs Lungs: clear, no wheezes, rhonchi, rales Ext: no edema Pulses normal  Assessment: Encounter Diagnoses  Name Primary?  Marland Kitchen Asthma Yes  . Tobacco use disorder   . GERD (gastroesophageal reflux disease)   . Chronic hoarseness    Plan: Asthma - much improved from recent bronchitis.  Mild intermittent asthma.  Uses albuterol inhaler prn.  Declines spirometry although advised.  Reviewed 06/2011 chest xray.  Tobacco use - begin chantix.  Discussed risks of tobacco.  Discussed risks/benefits of medication. She agrees to trial of chantix, seems motivated to quit.  Recheck  51mo    GERD - using OTC Zantac.   Chronic hoarseness - advised referral to ENT but she declines.  Will consider at recheck.

## 2012-12-21 ENCOUNTER — Other Ambulatory Visit: Payer: Self-pay | Admitting: Family Medicine

## 2012-12-21 NOTE — Telephone Encounter (Signed)
IS THIS OK 

## 2012-12-23 NOTE — Telephone Encounter (Signed)
Renew with 2 refills

## 2013-01-11 ENCOUNTER — Telehealth: Payer: Self-pay | Admitting: Medical

## 2013-01-12 ENCOUNTER — Other Ambulatory Visit: Payer: Self-pay | Admitting: Medical

## 2013-01-12 DIAGNOSIS — J45909 Unspecified asthma, uncomplicated: Secondary | ICD-10-CM

## 2013-01-12 MED ORDER — BUPROPION HCL ER (XL) 150 MG PO TB24: 150.0000 mg | ORAL_TABLET | Freq: Every day | ORAL | Status: DC

## 2013-01-12 MED ORDER — ALBUTEROL SULFATE HFA 108 (90 BASE) MCG/ACT IN AERS: 2.0000 | INHALATION_SPRAY | Freq: Four times a day (QID) | RESPIRATORY_TRACT | Status: DC | PRN

## 2013-01-12 NOTE — Telephone Encounter (Signed)
Sent inhaler.  Did she present coupon discount card for chantix?  If not, we probably have one we can give to help with the cost . I would try this first before just giving up on chantix.   If she already had coupon card and still too expensive, then we can try Wellbutrin.  I did send new med Wellbutrin once daily to help with smoking;  i want to see her back in 69mo on this

## 2013-01-17 ENCOUNTER — Other Ambulatory Visit: Payer: Self-pay | Admitting: Family Medicine

## 2013-01-17 MED ORDER — ALBUTEROL SULFATE HFA 108 (90 BASE) MCG/ACT IN AERS: 2.0000 | INHALATION_SPRAY | Freq: Two times a day (BID) | RESPIRATORY_TRACT | Status: DC

## 2013-02-10 ENCOUNTER — Telehealth: Payer: Self-pay | Admitting: Internal Medicine

## 2013-02-10 MED ORDER — LISINOPRIL-HYDROCHLOROTHIAZIDE 20-12.5 MG PO TABS: 1.0000 | ORAL_TABLET | Freq: Every day | ORAL | Status: DC

## 2013-02-10 NOTE — Telephone Encounter (Signed)
done

## 2013-02-21 NOTE — Telephone Encounter (Signed)
TSD  

## 2013-03-11 ENCOUNTER — Telehealth: Payer: Self-pay | Admitting: Family Medicine

## 2013-03-11 MED ORDER — LISINOPRIL-HYDROCHLOROTHIAZIDE 20-12.5 MG PO TABS: 1.0000 | ORAL_TABLET | Freq: Every day | ORAL | Status: DC

## 2013-03-11 NOTE — Telephone Encounter (Signed)
done

## 2013-03-25 ENCOUNTER — Ambulatory Visit (INDEPENDENT_AMBULATORY_CARE_PROVIDER_SITE_OTHER): Payer: PRIVATE HEALTH INSURANCE | Admitting: Family Medicine

## 2013-03-25 ENCOUNTER — Encounter: Payer: Self-pay | Admitting: Family Medicine

## 2013-03-25 VITALS — BP 126/74 | HR 76 | Wt 247.0 lb

## 2013-03-25 DIAGNOSIS — I152 Hypertension secondary to endocrine disorders: Secondary | ICD-10-CM

## 2013-03-25 DIAGNOSIS — M549 Dorsalgia, unspecified: Secondary | ICD-10-CM

## 2013-03-25 DIAGNOSIS — G8929 Other chronic pain: Secondary | ICD-10-CM

## 2013-03-25 DIAGNOSIS — I10 Essential (primary) hypertension: Secondary | ICD-10-CM

## 2013-03-25 DIAGNOSIS — E119 Type 2 diabetes mellitus without complications: Secondary | ICD-10-CM

## 2013-03-25 DIAGNOSIS — E1169 Type 2 diabetes mellitus with other specified complication: Secondary | ICD-10-CM

## 2013-03-25 DIAGNOSIS — E669 Obesity, unspecified: Secondary | ICD-10-CM

## 2013-03-25 DIAGNOSIS — E1159 Type 2 diabetes mellitus with other circulatory complications: Secondary | ICD-10-CM

## 2013-03-25 LAB — POCT GLYCOSYLATED HEMOGLOBIN (HGB A1C): Hemoglobin A1C: 7.3

## 2013-03-25 NOTE — Progress Notes (Signed)
  Subjective:    Patient ID: [name], female    DOB: September 12, 1956, 57 y.o.   MRN: [id number]  HPI She is here for an interval evaluation. She is still trying to quit smoking. She has not started on the Wellbutrin and has questions concerning this. She states that one pack of cigarettes lasts for 3 days. She also has difficulty with physical activities due to her back and leg discomfort. She also is now involved with someone and does have some slight difficulties with pain with intercourse.   Review of Systems     Objective:   Physical Exam Alert and in no distress otherwise not examined. Hemoglobin A1c is 7.3       Assessment & Plan:  Diabetes mellitus - Plan: HgB A1c  Obesity (BMI 30-39.9)  Chronic back pain  Hypertension associated with diabetes the majority of the visit was spent discussing smoking in regard to habit versus addiction. I explained that she is probably not truly addicted to nicotine but most of her problems are habit related. Strongly encouraged her to call the 800 number. Discussed the use of Wellbutrin to help with this. We also discussed exercise on a regular basis even if it's only for 5 minutes at a time to help with weight reduction and her pain. We also discussed the discomfort she is having with sexual intercourse. Recommended using an artificial lubricant and if further difficulty, return here for possible use of vaginal estrogen creams. Continue present medication regimen. Recheck here in several months.

## 2013-04-05 ENCOUNTER — Telehealth: Payer: Self-pay | Admitting: Family Medicine

## 2013-04-05 MED ORDER — METFORMIN HCL 500 MG PO TABS: 500.0000 mg | ORAL_TABLET | Freq: Two times a day (BID) | ORAL | Status: DC

## 2013-04-05 NOTE — Telephone Encounter (Signed)
THERE IS NO METFORMIN FOR 1000 MG IN HER CHART SHE MUST NOT HAVE GOTTEN THAT FROM [place] BUT SENT IN HER REGULAR 500 MG BID

## 2013-04-26 ENCOUNTER — Telehealth: Payer: Self-pay | Admitting: Family Medicine

## 2013-04-26 NOTE — Telephone Encounter (Signed)
LM

## 2013-06-13 ENCOUNTER — Telehealth: Payer: Self-pay | Admitting: Internal Medicine

## 2013-06-13 MED ORDER — LISINOPRIL-HYDROCHLOROTHIAZIDE 20-12.5 MG PO TABS: 1.0000 | ORAL_TABLET | Freq: Every day | ORAL | Status: DC

## 2013-06-13 NOTE — Telephone Encounter (Signed)
SENT IN B/P MED  

## 2013-06-13 NOTE — Telephone Encounter (Signed)
Refill request for lisinopril-hctz 20-12.5 #30 to [facility] [address]

## 2013-06-20 ENCOUNTER — Telehealth: Payer: Self-pay | Admitting: Internal Medicine

## 2013-06-20 NOTE — Telephone Encounter (Signed)
Tell her to be aware that if she does have swelling to let us know but wouldn't stop because of her sister having trouble

## 2013-06-20 NOTE — Telephone Encounter (Signed)
Pt states that her sister has had a reaction from lisinopril-hctz and swelled up her lips and pt states she is on lisinopril-hctz too and wants to know should she stay on that med too. She states she has had some headaches, cramps, and dry mouth from this med. And with her sister having this bad reaction she is just scared something may happen to her and she wasn't sure if it would be a good idea to switch to another med

## 2013-06-21 NOTE — Telephone Encounter (Signed)
PT INFORMED WORD FOR WORD 

## 2013-07-08 ENCOUNTER — Telehealth: Payer: Self-pay | Admitting: Internal Medicine

## 2013-07-08 NOTE — Telephone Encounter (Signed)
Pt wants to use [facility] for a back brace fax # is [phone]

## 2013-07-14 ENCOUNTER — Ambulatory Visit (INDEPENDENT_AMBULATORY_CARE_PROVIDER_SITE_OTHER): Payer: PRIVATE HEALTH INSURANCE | Admitting: Family Medicine

## 2013-07-14 DIAGNOSIS — G8929 Other chronic pain: Secondary | ICD-10-CM

## 2013-07-14 DIAGNOSIS — E739 Lactose intolerance, unspecified: Secondary | ICD-10-CM

## 2013-07-14 DIAGNOSIS — E119 Type 2 diabetes mellitus without complications: Secondary | ICD-10-CM

## 2013-07-14 DIAGNOSIS — M549 Dorsalgia, unspecified: Secondary | ICD-10-CM

## 2013-07-14 DIAGNOSIS — E669 Obesity, unspecified: Secondary | ICD-10-CM

## 2013-07-14 MED ORDER — IBUPROFEN 800 MG PO TABS: 800.0000 mg | ORAL_TABLET | Freq: Three times a day (TID) | ORAL | Status: DC | PRN

## 2013-07-14 NOTE — Progress Notes (Signed)
  Subjective:    Patient ID: [name], female    DOB: 1956/11/30, 57 y.o.   MRN: [id number]  HPI She is here for consult concerning a couple issues. She is concerned over lactose intolerance. She does admit having difficulty with milk and milk products causing a lot of bloating and abdominal gas and diarrhea. She also has questions concerning her carbohydrate intake. Strongly encouraged her to cut back on all carbohydrates especially bread, rice, pasta and potatoes. She has been through diabetes education. She also continues to have chronic back pain and finds it using ibuprofen once or twice per week and help a lot with her back pain. Encouraged her to continue with physical therapy and a good back rehabilitation program.   Review of Systems     Objective:   Physical Exam  Alert and in no distress otherwise not examined      Assessment & Plan:  Lactose intolerance  Chronic back pain - Plan: ibuprofen (ADVIL,MOTRIN) 800 MG tablet  Diabetes mellitus  Obesity (BMI 30-39.9)  over 20 minutes spent discussing all these issues. Recommended trying Lactaid for lactose intolerance and strongly encouraged a good back rehabilitation program. Also reinforced the need for her to cut back on carbohydrates. Also discussed the use of back braces. I explained that in order for braces to be used, she would need to be referred to PT/OT. She's not having a difficulty with her back and therefore is not interested in a brace.

## 2013-07-14 NOTE — Patient Instructions (Signed)
Try Lactaid 

## 2013-09-19 ENCOUNTER — Encounter: Payer: PRIVATE HEALTH INSURANCE | Admitting: Family Medicine

## 2013-09-28 ENCOUNTER — Ambulatory Visit (INDEPENDENT_AMBULATORY_CARE_PROVIDER_SITE_OTHER): Payer: PRIVATE HEALTH INSURANCE | Admitting: Family Medicine

## 2013-09-28 ENCOUNTER — Encounter: Payer: Self-pay | Admitting: Family Medicine

## 2013-09-28 VITALS — BP 130/80 | HR 76 | Ht 66.0 in | Wt 240.0 lb

## 2013-09-28 DIAGNOSIS — Z0000 Encounter for general adult medical examination without abnormal findings: Secondary | ICD-10-CM

## 2013-09-28 DIAGNOSIS — M549 Dorsalgia, unspecified: Secondary | ICD-10-CM

## 2013-09-28 DIAGNOSIS — F17200 Nicotine dependence, unspecified, uncomplicated: Secondary | ICD-10-CM

## 2013-09-28 DIAGNOSIS — E1159 Type 2 diabetes mellitus with other circulatory complications: Secondary | ICD-10-CM

## 2013-09-28 DIAGNOSIS — G8929 Other chronic pain: Secondary | ICD-10-CM

## 2013-09-28 DIAGNOSIS — E1169 Type 2 diabetes mellitus with other specified complication: Secondary | ICD-10-CM

## 2013-09-28 DIAGNOSIS — Z23 Encounter for immunization: Secondary | ICD-10-CM | POA: Diagnosis not present

## 2013-09-28 DIAGNOSIS — I152 Hypertension secondary to endocrine disorders: Secondary | ICD-10-CM

## 2013-09-28 DIAGNOSIS — E119 Type 2 diabetes mellitus without complications: Secondary | ICD-10-CM

## 2013-09-28 DIAGNOSIS — Z87891 Personal history of nicotine dependence: Secondary | ICD-10-CM | POA: Insufficient documentation

## 2013-09-28 DIAGNOSIS — E669 Obesity, unspecified: Secondary | ICD-10-CM

## 2013-09-28 DIAGNOSIS — I10 Essential (primary) hypertension: Secondary | ICD-10-CM

## 2013-09-28 DIAGNOSIS — K5900 Constipation, unspecified: Secondary | ICD-10-CM

## 2013-09-28 LAB — CBC WITH DIFFERENTIAL/PLATELET
Basophils Absolute: 0.1 10*3/uL (ref 0.0–0.1)
Basophils Relative: 1 % (ref 0–1)
Eosinophils Absolute: 0.2 10*3/uL (ref 0.0–0.7)
Eosinophils Relative: 3 % (ref 0–5)
HCT: 38.9 % (ref 36.0–46.0)
MCH: 30.8 pg (ref 26.0–34.0)
MCHC: 34.2 g/dL (ref 30.0–36.0)
Monocytes Absolute: 0.5 10*3/uL (ref 0.1–1.0)
Neutro Abs: 4.2 10*3/uL (ref 1.7–7.7)
RDW: 13.5 % (ref 11.5–15.5)

## 2013-09-28 LAB — POCT GLYCOSYLATED HEMOGLOBIN (HGB A1C): Hemoglobin A1C: 6.6

## 2013-09-28 LAB — LIPID PANEL
HDL: 44 mg/dL (ref >39)
LDL Cholesterol: 106 mg/dL — ABNORMAL HIGH (ref 0–99)
Total CHOL/HDL Ratio: 3.9 Ratio
Triglycerides: 109 mg/dL (ref <150)

## 2013-09-28 LAB — COMPREHENSIVE METABOLIC PANEL
AST: 15 U/L (ref 0–37)
Alkaline Phosphatase: 51 U/L (ref 39–117)
BUN: 28 mg/dL — ABNORMAL HIGH (ref 6–23)
Creat: 1.37 mg/dL — ABNORMAL HIGH (ref 0.50–1.10)
Total Bilirubin: 0.5 mg/dL (ref 0.3–1.2)

## 2013-09-28 NOTE — Progress Notes (Signed)
Subjective:    Patient ID: [name], female    DOB: [birth year], 57 y.o.   MRN: [id number]  HPI She is here for complete examination. She does have underlying diabetes which she is handling somewhat. She has been involved in dietary modification but has not made much of an effort there. She was seen by dietitian before she moved here and was seen once here. She has been losing weight on it particular program she is now involved in. She does smoke and is now working on quitting smoking on her own. She tried to get Chantix but could not get it covered. She did not get the Wellbutrin either. She continues to complain of thigh and leg pain but does use 800 mg of ibuprofen but not on any cover regular basis. She also uses tramadol again more or less on an as-needed basis. She does complain of some abdominal symptoms of food causing the fullness with this does respond to Zantac. This occurs several times per week. She also complains of constipation.  Review of Systems Negative except as above    Objective:   Physical Exam BP 130/80  Pulse 76  Ht 5\' 6"  (1.676 m)  Wt 240 lb (108.863 kg)  BMI 38.76 kg/m2  SpO2 98%  General Appearance:    Alert, cooperative, no distress, appears stated age  Head:    Normocephalic, without obvious abnormality, atraumatic  Eyes:    PERRL, conjunctiva/corneas clear, EOM's intact, fundi    benign  Ears:    Normal TM's and external ear canals  Nose:   Nares normal, mucosa normal, no drainage or sinus   tenderness  Throat:   Lips, mucosa, and tongue normal; teeth and gums normal  Neck:   Supple, no lymphadenopathy;  thyroid:  no   enlargement/tenderness/nodules; no carotid   bruit or JVD  Back:    Spine nontender, no curvature, ROM normal, no CVA     tenderness  Lungs:     Clear to auscultation bilaterally without wheezes, rales or     ronchi; respirations unlabored  Chest Wall:    No tenderness or deformity   Heart:    Regular rate and rhythm, S1 and S2  normal, no murmur, rub   or gallop  Breast Exam:    Deferred to GYN  Abdomen:     Soft, non-tender, nondistended, normoactive bowel sounds,    no masses, no hepatosplenomegaly  Genitalia:    Deferred to GYN     Extremities:   No clubbing, cyanosis or edema  Pulses:   2+ and symmetric all extremities  Skin:   Skin color, texture, turgor normal, no rashes or lesions  Lymph nodes:   Cervical, supraclavicular, and axillary nodes normal  Neurologic:   CNII-XII intact, normal strength, sensation and gait; reflexes 2+ and symmetric throughout          Psych:   Normal mood, affect, hygiene and grooming.          Assessment & Plan:  Routine general medical examination at a health care facility - Plan: CBC with Differential, Comprehensive metabolic panel, Lipid panel, POCT UA - Microalbumin  Diabetes mellitus - Plan: POCT glycosylated hemoglobin (Hb A1C), CBC with Differential, Comprehensive metabolic panel, Lipid panel, POCT UA - Microalbumin  Need for prophylactic vaccination and inoculation against unspecified single disease - Plan: Hepatitis B vaccine adult IM  Obesity (BMI 30-39.9)  Hypertension associated with diabetes  Chronic back pain  Current smoker on some days  Unspecified  constipation  it was difficult for him to stay on task however he did recommend continuing with her diet and exercise. She work on smoking on her own. Recommend increasing her ibuprofen dosing as well as using tramadol more regularly to help with her back pain. Continue on Zantac. Discussed fluids, exercise and bulk in her diet for her constipation.

## 2013-09-28 NOTE — Patient Instructions (Signed)
Bulk in your diet, plenty of fluids, exercise. Send to your body meaning if you have the urge obey it.

## 2013-09-29 NOTE — Progress Notes (Signed)
Quick Note:  MAILED PT LETTER OF LABS ______

## 2013-12-09 ENCOUNTER — Telehealth: Payer: Self-pay | Admitting: Internal Medicine

## 2013-12-09 DIAGNOSIS — E119 Type 2 diabetes mellitus without complications: Secondary | ICD-10-CM

## 2013-12-09 MED ORDER — METFORMIN HCL 500 MG PO TABS: 500.0000 mg | ORAL_TABLET | Freq: Two times a day (BID) | ORAL | Status: DC

## 2013-12-09 NOTE — Telephone Encounter (Signed)
Rx sent to pharm

## 2013-12-09 NOTE — Telephone Encounter (Signed)
Refill request for metformin 500mg  #60 to [facility] [address]

## 2013-12-15 ENCOUNTER — Encounter: Payer: Self-pay | Admitting: Family Medicine

## 2013-12-15 ENCOUNTER — Ambulatory Visit (INDEPENDENT_AMBULATORY_CARE_PROVIDER_SITE_OTHER): Payer: PRIVATE HEALTH INSURANCE | Admitting: Family Medicine

## 2013-12-15 VITALS — BP 112/60 | HR 90 | Wt 234.0 lb

## 2013-12-15 DIAGNOSIS — M549 Dorsalgia, unspecified: Secondary | ICD-10-CM

## 2013-12-15 DIAGNOSIS — E119 Type 2 diabetes mellitus without complications: Secondary | ICD-10-CM

## 2013-12-15 NOTE — Patient Instructions (Signed)
I am going to send her for physical therapy for the back rehabilitation program. You can take as many as 1 800 mg ibuprofen 3 times per day

## 2013-12-15 NOTE — Progress Notes (Signed)
   Subjective:    Patient ID: [name], female    DOB: 18-Dec-1955, 58 y.o.   MRN: [id number]  HPI She has a long history of back pain but in the last several weeks she has noted increased right-sided pain. It bothers her more at night when she is in bed. She was given physical therapy in the past but admits to not being good at sticking with that regimen. She does have underlying diabetes and is on medications listed in the chart. Her diet has not been adequate.   Review of Systems     Objective:   Physical Exam Alert and in no distress. Exam of her back shows no tenderness over the lumbar or sacral spine. Some tenderness over right greater trochanter. Also slight tenderness over the right SI joint with provocative testing being negative. Leg raising negative. DTRs slightly diminished.       Assessment & Plan:  Back pain - Plan: Ambulatory referral to Physical Therapy  Type II or unspecified type diabetes mellitus without mention of complication, not stated as uncontrolled  reinforced the fact that she needs to get back into physical therapy and stick with the program. Also encouraged her to be more aggressive in taking care of her diabetes especially in regard to diet and exercise.

## 2014-02-19 ENCOUNTER — Other Ambulatory Visit: Payer: Self-pay | Admitting: Medical

## 2014-04-18 ENCOUNTER — Telehealth: Payer: Self-pay | Admitting: Internal Medicine

## 2014-04-18 NOTE — Telephone Encounter (Signed)
PT DOES NOT NEED A KNEE BRACE FROM [facility]

## 2014-04-28 ENCOUNTER — Telehealth: Payer: Self-pay | Admitting: Family Medicine

## 2014-04-28 MED ORDER — GLUCOSE BLOOD VI STRP
ORAL_STRIP | Status: DC
Start: 2014-04-28 — End: 2015-08-14

## 2014-04-28 NOTE — Telephone Encounter (Signed)
Left message on pt VM regarding message

## 2014-05-10 ENCOUNTER — Telehealth: Payer: Self-pay | Admitting: Internal Medicine

## 2014-05-10 DIAGNOSIS — E119 Type 2 diabetes mellitus without complications: Secondary | ICD-10-CM

## 2014-05-10 MED ORDER — METFORMIN HCL 500 MG PO TABS: 500.0000 mg | ORAL_TABLET | Freq: Two times a day (BID) | ORAL | Status: DC

## 2014-05-10 NOTE — Telephone Encounter (Signed)
Refill request for a 90 day supply of metformin 500mg  to [facility] [address]

## 2014-05-23 ENCOUNTER — Telehealth: Payer: Self-pay | Admitting: Internal Medicine

## 2014-05-23 MED ORDER — LISINOPRIL-HYDROCHLOROTHIAZIDE 20-12.5 MG PO TABS: 1.0000 | ORAL_TABLET | Freq: Every day | ORAL | Status: DC

## 2014-05-23 NOTE — Telephone Encounter (Signed)
Refill request for lisinopril-hctz 20-12.5mg  to [facility] [address]

## 2014-06-06 ENCOUNTER — Encounter: Payer: Self-pay | Admitting: Family Medicine

## 2014-06-08 ENCOUNTER — Telehealth: Payer: Self-pay | Admitting: Family Medicine

## 2014-06-08 NOTE — Telephone Encounter (Signed)
LEFT MESSAGE TO FIND OUT HOW OFTEN SHE HAS TO USE NEB.? AND ASKED FOR HER TO CALL us BACK TOMMOROW

## 2014-06-08 NOTE — Telephone Encounter (Signed)
Find out how often she has to use a nebulizer. She has the albuterol inhaler which usually should suffice.

## 2014-06-09 ENCOUNTER — Other Ambulatory Visit: Payer: Self-pay

## 2014-06-09 MED ORDER — ALBUTEROL SULFATE (2.5 MG/3ML) 0.083% IN NEBU: 2.5000 mg | INHALATION_SOLUTION | Freq: Four times a day (QID) | RESPIRATORY_TRACT | Status: DC | PRN

## 2014-06-09 NOTE — Telephone Encounter (Signed)
Go ahead and renew this

## 2014-06-09 NOTE — Telephone Encounter (Signed)
SENT IN NEB MED

## 2014-06-09 NOTE — Telephone Encounter (Signed)
DONE

## 2014-06-09 NOTE — Telephone Encounter (Signed)
Pt called stating that she does not use the nebulizer machine regularly. She had a bad attack with her breathing over the past week and had to use the machine for first time in over a year or more however pt noticed that nebulizer solution is past expiration date. Pt thinks the heat is causing her difficulty with breathing where inhaler alone is not helping.

## 2014-07-26 ENCOUNTER — Institutional Professional Consult (permissible substitution): Payer: PRIVATE HEALTH INSURANCE | Admitting: Family Medicine

## 2014-08-02 ENCOUNTER — Ambulatory Visit (INDEPENDENT_AMBULATORY_CARE_PROVIDER_SITE_OTHER): Payer: PRIVATE HEALTH INSURANCE | Admitting: Family Medicine

## 2014-08-02 VITALS — BP 116/70 | HR 77 | Wt 220.0 lb

## 2014-08-02 DIAGNOSIS — Z91199 Patient's noncompliance with other medical treatment and regimen due to unspecified reason: Secondary | ICD-10-CM

## 2014-08-02 DIAGNOSIS — E669 Obesity, unspecified: Secondary | ICD-10-CM

## 2014-08-02 DIAGNOSIS — E119 Type 2 diabetes mellitus without complications: Secondary | ICD-10-CM

## 2014-08-02 DIAGNOSIS — E1159 Type 2 diabetes mellitus with other circulatory complications: Secondary | ICD-10-CM

## 2014-08-02 DIAGNOSIS — E1169 Type 2 diabetes mellitus with other specified complication: Secondary | ICD-10-CM

## 2014-08-02 DIAGNOSIS — I152 Hypertension secondary to endocrine disorders: Secondary | ICD-10-CM

## 2014-08-02 DIAGNOSIS — Z9119 Patient's noncompliance with other medical treatment and regimen: Secondary | ICD-10-CM

## 2014-08-02 DIAGNOSIS — I10 Essential (primary) hypertension: Secondary | ICD-10-CM

## 2014-08-02 DIAGNOSIS — F17200 Nicotine dependence, unspecified, uncomplicated: Secondary | ICD-10-CM

## 2014-08-02 LAB — POCT GLYCOSYLATED HEMOGLOBIN (HGB A1C): HEMOGLOBIN A1C: 6.1

## 2014-08-02 NOTE — Patient Instructions (Signed)
Call me next Tuesday or Wednesday concerning your blood pressure medication. Check your sugars daily usually before a meal or 2 hours after a meal. You sheet of paper and your blood sugar

## 2014-08-02 NOTE — Progress Notes (Signed)
Subjective:    [name] is a 58 y.o. female who presents for follow-up of Type 2 diabetes mellitus.  She has stopped taking both the metformin and lisinopril. Her main complaint apparently was fatigue although she has had some coughing. It was very difficult for her to stay on task and give me a coherent history as to exactly why she was doing all this. She also complained of various aches and pains but again I could not get her to be specific. Home blood sugar records: 120 IS THE HIGH TEST SOMETIMES  Current symptoms/problems includE NONE Daily foot checks:Any foot concerns: YES/TINGLE RIGHT FOOT for one day Last eye exam:  01/29/14 [facility] [place]    Medication compliance: poor Current diet: NONE Current exercise: NONE Known diabetic complications: none Cardiovascular risk factors: advanced age (older than 84 for men, 63 for women), diabetes mellitus, dyslipidemia, hypertension, obesity (BMI >= 30 kg/m2) and sedentary lifestyle , smoking    ROS as in subjective above Hemoglobin A1c 6.1   Objective:    General appearence: alert, no distress, WD/WN   Lab Review Lab Results  Component Value Date   HGBA1C 6.6 09/28/2013   Lab Results  Component Value Date   CHOL 172 09/28/2013   HDL 44 09/28/2013   LDLCALC 106* 09/28/2013   TRIG 109 09/28/2013   CHOLHDL 3.9 09/28/2013   No results found for this basenameDerl Barrow     Chemistry      Component Value Date/Time   NA 138 09/28/2013 1441   K 4.6 09/28/2013 1441   CL 104 09/28/2013 1441   CO2 25 09/28/2013 1441   BUN 28* 09/28/2013 1441   CREATININE 1.37* 09/28/2013 1441   CREATININE 0.90 08/11/2011 1221      Component Value Date/Time   CALCIUM 10.2 09/28/2013 1441   ALKPHOS 51 09/28/2013 1441   AST 15 09/28/2013 1441   ALT 13 09/28/2013 1441   BILITOT 0.5 09/28/2013 1441        Chemistry      Component Value Date/Time   NA 138 09/28/2013 1441   K 4.6 09/28/2013 1441   CL 104  09/28/2013 1441   CO2 25 09/28/2013 1441   BUN 28* 09/28/2013 1441   CREATININE 1.37* 09/28/2013 1441   CREATININE 0.90 08/11/2011 1221      Component Value Date/Time   CALCIUM 10.2 09/28/2013 1441   ALKPHOS 51 09/28/2013 1441   AST 15 09/28/2013 1441   ALT 13 09/28/2013 1441   BILITOT 0.5 09/28/2013 1441       Last optometry/ophthalmology exam reviewed from:    Assessment:   Encounter Diagnoses  Name Primary?  . Type II or unspecified type diabetes mellitus without mention of complication, not stated as uncontrolled Yes  . Obesity (BMI 30-39.9)   . Hypertension associated with diabetes   . Type 2 diabetes mellitus without complication   . Current smoker on some days   . Personal history of noncompliance with medical treatment, presenting hazards to health           Plan:    1.  Rx changes: She will hold lisinopril for the next several days and call me on Tuesday to see if this makes any difference in her coughing. She is also to take her metformin daily and keep track of her blood sugars. 2.  Education: Reviewed 'ABCs' of diabetes management (respective goals in parentheses):  A1C (<7), blood pressure (<130/80), and cholesterol (LDL <100). 3.  Compliance at  present is estimated to be unsatisfactory. Efforts to improve compliance (if necessary) will be directed at increased exercise. 4. Follow up: 4 months  Her main complaint apparently was fatigue although is difficult to get her to focus on this. She does have a very inactive lifestyle and I recommended getting more physically active.

## 2014-08-08 ENCOUNTER — Other Ambulatory Visit (INDEPENDENT_AMBULATORY_CARE_PROVIDER_SITE_OTHER): Payer: PRIVATE HEALTH INSURANCE

## 2014-08-08 DIAGNOSIS — Z111 Encounter for screening for respiratory tuberculosis: Secondary | ICD-10-CM

## 2014-08-09 ENCOUNTER — Telehealth: Payer: Self-pay | Admitting: Family Medicine

## 2014-08-09 DIAGNOSIS — G8929 Other chronic pain: Secondary | ICD-10-CM

## 2014-08-09 DIAGNOSIS — M549 Dorsalgia, unspecified: Principal | ICD-10-CM

## 2014-08-09 NOTE — Telephone Encounter (Signed)
Pt called and requested refills on Ibuprofen 800 mg. [facility] on [address]

## 2014-08-10 LAB — TB SKIN TEST
Induration: 0 mm
TB Skin Test: NEGATIVE

## 2014-08-10 MED ORDER — IBUPROFEN 800 MG PO TABS: 800.0000 mg | ORAL_TABLET | Freq: Three times a day (TID) | ORAL | Status: DC | PRN

## 2014-08-22 ENCOUNTER — Telehealth: Payer: Self-pay | Admitting: Internal Medicine

## 2014-08-22 NOTE — Telephone Encounter (Signed)
Pt uses [facility] for her Diabetic testing supplies. Pt tests a couple days a week.

## 2014-09-05 ENCOUNTER — Other Ambulatory Visit: Payer: Self-pay | Admitting: Medical

## 2014-09-05 ENCOUNTER — Other Ambulatory Visit: Payer: Self-pay | Admitting: Family Medicine

## 2014-09-08 ENCOUNTER — Other Ambulatory Visit: Payer: Self-pay | Admitting: Family Medicine

## 2014-09-29 ENCOUNTER — Encounter: Payer: PRIVATE HEALTH INSURANCE | Admitting: Family Medicine

## 2014-10-02 ENCOUNTER — Telehealth: Payer: Self-pay | Admitting: Family Medicine

## 2014-10-02 NOTE — Telephone Encounter (Signed)
lm

## 2014-10-10 ENCOUNTER — Ambulatory Visit (INDEPENDENT_AMBULATORY_CARE_PROVIDER_SITE_OTHER): Payer: PRIVATE HEALTH INSURANCE | Admitting: Family Medicine

## 2014-10-10 ENCOUNTER — Encounter: Payer: Self-pay | Admitting: Family Medicine

## 2014-10-10 ENCOUNTER — Encounter: Payer: PRIVATE HEALTH INSURANCE | Admitting: Family Medicine

## 2014-10-10 VITALS — BP 130/72 | HR 72 | Ht 65.5 in | Wt 220.0 lb

## 2014-10-10 DIAGNOSIS — K219 Gastro-esophageal reflux disease without esophagitis: Secondary | ICD-10-CM | POA: Diagnosis not present

## 2014-10-10 DIAGNOSIS — Z0000 Encounter for general adult medical examination without abnormal findings: Secondary | ICD-10-CM | POA: Diagnosis not present

## 2014-10-10 DIAGNOSIS — M549 Dorsalgia, unspecified: Secondary | ICD-10-CM

## 2014-10-10 DIAGNOSIS — E1159 Type 2 diabetes mellitus with other circulatory complications: Secondary | ICD-10-CM

## 2014-10-10 DIAGNOSIS — I152 Hypertension secondary to endocrine disorders: Secondary | ICD-10-CM

## 2014-10-10 DIAGNOSIS — I10 Essential (primary) hypertension: Secondary | ICD-10-CM | POA: Diagnosis not present

## 2014-10-10 DIAGNOSIS — E1169 Type 2 diabetes mellitus with other specified complication: Secondary | ICD-10-CM | POA: Diagnosis not present

## 2014-10-10 DIAGNOSIS — E119 Type 2 diabetes mellitus without complications: Secondary | ICD-10-CM | POA: Diagnosis not present

## 2014-10-10 DIAGNOSIS — G4700 Insomnia, unspecified: Secondary | ICD-10-CM | POA: Diagnosis not present

## 2014-10-10 DIAGNOSIS — E669 Obesity, unspecified: Secondary | ICD-10-CM | POA: Diagnosis not present

## 2014-10-10 DIAGNOSIS — G8929 Other chronic pain: Secondary | ICD-10-CM | POA: Diagnosis not present

## 2014-10-10 DIAGNOSIS — Z23 Encounter for immunization: Secondary | ICD-10-CM | POA: Diagnosis not present

## 2014-10-10 LAB — COMPREHENSIVE METABOLIC PANEL
ALBUMIN: 4.6 g/dL (ref 3.5–5.2)
ALT: 9 U/L (ref 0–35)
AST: 12 U/L (ref 0–37)
Alkaline Phosphatase: 54 U/L (ref 39–117)
BUN: 37 mg/dL — ABNORMAL HIGH (ref 6–23)
CALCIUM: 9.6 mg/dL (ref 8.4–10.5)
CHLORIDE: 100 meq/L (ref 96–112)
CO2: 23 meq/L (ref 19–32)
Creat: 1.59 mg/dL — ABNORMAL HIGH (ref 0.50–1.10)
Glucose, Bld: 94 mg/dL (ref 70–99)
POTASSIUM: 4.4 meq/L (ref 3.5–5.3)
Sodium: 135 mEq/L (ref 135–145)
Total Bilirubin: 0.5 mg/dL (ref 0.2–1.2)
Total Protein: 7.3 g/dL (ref 6.0–8.3)

## 2014-10-10 LAB — CBC WITH DIFFERENTIAL/PLATELET
BASOS ABS: 0.1 10*3/uL (ref 0.0–0.1)
Basophils Relative: 1 % (ref 0–1)
Eosinophils Absolute: 0.3 10*3/uL (ref 0.0–0.7)
Eosinophils Relative: 3 % (ref 0–5)
HCT: 37.5 % (ref 36.0–46.0)
Hemoglobin: 12.7 g/dL (ref 12.0–15.0)
LYMPHS ABS: 3.9 10*3/uL (ref 0.7–4.0)
LYMPHS PCT: 37 % (ref 12–46)
MCH: 30.4 pg (ref 26.0–34.0)
MCHC: 33.9 g/dL (ref 30.0–36.0)
MCV: 89.7 fL (ref 78.0–100.0)
Monocytes Absolute: 0.6 10*3/uL (ref 0.1–1.0)
Monocytes Relative: 6 % (ref 3–12)
NEUTROS ABS: 5.6 10*3/uL (ref 1.7–7.7)
Neutrophils Relative %: 53 % (ref 43–77)
PLATELETS: 387 10*3/uL (ref 150–400)
RBC: 4.18 MIL/uL (ref 3.87–5.11)
RDW: 13.3 % (ref 11.5–15.5)
WBC: 10.5 10*3/uL (ref 4.0–10.5)

## 2014-10-10 LAB — LIPID PANEL
Cholesterol: 142 mg/dL (ref 0–200)
HDL: 46 mg/dL (ref >39)
LDL Cholesterol: 76 mg/dL (ref 0–99)
Total CHOL/HDL Ratio: 3.1 Ratio
Triglycerides: 100 mg/dL (ref <150)
VLDL: 20 mg/dL (ref 0–40)

## 2014-10-10 NOTE — Progress Notes (Signed)
Subjective:    Patient ID: [name], female    DOB: 1956-11-24, 58 y.o.   MRN: [id number]  HPI She is here for complete examination. She did complain of slight discomfort in the left third finger at the MCP joint but no history of injury. She complains of fatigue, difficulty with sleep, chronic back pain. She did start smoking again but then quit within the recent past.She states that pain tends to wake her up at night. She has used Advil PM 3 or 4 times per night. She will also use Advil when she works. She tended to ramble and it was very difficult for her to stay on task and answer questions concerning her sleep but apparently pain is a major issue on waking her up. She has a previous history of asthma however she is using her albuterol for prevention rather than for treatment so again it was difficult to determine whether she truly is having any asthma symptoms. She was recently seen for her diabetes and this is doing well. She did complain of cough but then demonstrated it and again this was more clearing the throat than truly a cough.he does complain of indigestion symptoms that are intermittent in nature and did respond to Pepcid.   Review of Systems  All other systems reviewed and are negative.      Objective:   Physical Exam BP 130/72 mmHg  Pulse 72  Ht 5' 5.5" (1.664 m)  Wt 220 lb (99.791 kg)  BMI 36.04 kg/m2  General Appearance:    Alert, cooperative, no distress, appears stated age  Head:    Normocephalic, without obvious abnormality, atraumatic  Eyes:    PERRL, conjunctiva/corneas clear, EOM's intact, fundi    benign  Ears:    Normal TM's and external ear canals  Nose:   Nares normal, mucosa normal, no drainage or sinus   tenderness  Throat:   Lips, mucosa, and tongue normal; teeth and gums normal  Neck:   Supple, no lymphadenopathy;  thyroid:  no   enlargement/tenderness/nodules; no carotid   bruit or JVD  Back:    Spine nontender, no curvature, ROM normal,  no CVA     tenderness  Lungs:     Clear to auscultation bilaterally without wheezes, rales or     ronchi; respirations unlabored  Chest Wall:    No tenderness or deformity   Heart:    Regular rate and rhythm, S1 and S2 normal, no murmur, rub   or gallop  Breast Exam:    Deferred to GYN  Abdomen:     Soft, non-tender, nondistended, normoactive bowel sounds,    no masses, no hepatosplenomegaly  Genitalia:    Deferred to GYN     Extremities:   No clubbing, cyanosis or edema  Pulses:   2+ and symmetric all extremities  Skin:   Skin color, texture, turgor normal, no rashes or lesions  Lymph nodes:   Cervical, supraclavicular, and axillary nodes normal  Neurologic:   CNII-XII intact, normal strength, sensation and gait; reflexes 2+ and symmetric throughout          Psych:   Normal mood, affect, hygiene and grooming.          Assessment & Plan:  Routine general medical examination at a health care facility - Plan: CBC with Differential, Comprehensive metabolic panel, Lipid panel  Chronic back pain  Type 2 diabetes mellitus without complication - Plan: CBC with Differential, Comprehensive metabolic panel, Lipid panel, POCT UA -  Microalbumin  Hypertension associated with diabetes  Obesity (BMI 30-39.9)  Need for Zostavax administration - Plan: Varicella-zoster vaccine subcutaneous  Insomnia he will continue on her present medications. After long discussion I finally convinced her to use Advil at night at 800 mg to see if this will help with the pain and allow her to sleep through the night. Also recommended that she not use the albuterol except she has difficulty with asthma symptoms. She also is to try Pepcid the next time she has difficulty with certain foods. Sleep hygiene issues given to her.

## 2014-10-10 NOTE — Patient Instructions (Addendum)
Insomnia Insomnia is frequent trouble falling and/or staying asleep. Insomnia can be a long term problem or a short term problem. Both are common. Insomnia can be a short term problem when the wakefulness is related to a certain stress or worry. Long term insomnia is often related to ongoing stress during waking hours and/or poor sleeping habits. Overtime, sleep deprivation itself can make the problem worse. Every little thing feels more severe because you are overtired and your ability to cope is decreased. CAUSES   Stress, anxiety, and depression.  Poor sleeping habits.  Distractions such as TV in the bedroom.  Naps close to bedtime.  Engaging in emotionally charged conversations before bed.  Technical reading before sleep.  Alcohol and other sedatives. They may make the problem worse. They can hurt normal sleep patterns and normal dream activity.  Stimulants such as caffeine for several hours prior to bedtime.  Pain syndromes and shortness of breath can cause insomnia.  Exercise late at night.  Changing time zones may cause sleeping problems (jet lag). It is sometimes helpful to have someone observe your sleeping patterns. They should look for periods of not breathing during the night (sleep apnea). They should also look to see how long those periods last. If you live alone or observers are uncertain, you can also be observed at a sleep clinic where your sleep patterns will be professionally monitored. Sleep apnea requires a checkup and treatment. Give your caregivers your medical history. Give your caregivers observations your family has made about your sleep.  SYMPTOMS   Not feeling rested in the morning.  Anxiety and restlessness at bedtime.  Difficulty falling and staying asleep. TREATMENT   Your caregiver may prescribe treatment for an underlying medical disorders. Your caregiver can give advice or help if you are using alcohol or other drugs for self-medication. Treatment  of underlying problems will usually eliminate insomnia problems.  Medications can be prescribed for short time use. They are generally not recommended for lengthy use.  Over-the-counter sleep medicines are not recommended for lengthy use. They can be habit forming.  You can promote easier sleeping by making lifestyle changes such as:  Using relaxation techniques that help with breathing and reduce muscle tension.  Exercising earlier in the day.  Changing your diet and the time of your last meal. No night time snacks.  Establish a regular time to go to bed.  Counseling can help with stressful problems and worry.  Soothing music and white noise may be helpful if there are background noises you cannot remove.  Stop tedious detailed work at least one hour before bedtime. HOME CARE INSTRUCTIONS   Keep a diary. Inform your caregiver about your progress. This includes any medication side effects. See your caregiver regularly. Take note of:  Times when you are asleep.  Times when you are awake during the night.  The quality of your sleep.  How you feel the next day. This information will help your caregiver care for you.  Get out of bed if you are still awake after 15 minutes. Read or do some quiet activity. Keep the lights down. Wait until you feel sleepy and go back to bed.  Keep regular sleeping and waking hours. Avoid naps.  Exercise regularly.  Avoid distractions at bedtime. Distractions include watching television or engaging in any intense or detailed activity like attempting to balance the household checkbook.  Develop a bedtime ritual. Keep a familiar routine of bathing, brushing your teeth, climbing into bed at the same   time each night, listening to soothing music. Routines increase the success of falling to sleep faster.  Use relaxation techniques. This can be using breathing and muscle tension release routines. It can also include visualizing peaceful scenes. You can  also help control troubling or intruding thoughts by keeping your mind occupied with boring or repetitive thoughts like the old concept of counting sheep. You can make it more creative like imagining planting one beautiful flower after another in your backyard garden.  During your day, work to eliminate stress. When this is not possible use some of the previous suggestions to help reduce the anxiety that accompanies stressful situations. MAKE SURE YOU:   Understand these instructions.  Will watch your condition.  Will get help right away if you are not doing well or get worse. Document Released: 11/14/2000 Document Revised: 02/09/2012 Document Reviewed: 12/15/2007 [facility] Patient Information 2015 [place], [place]. This information is not intended to replace advice given to you by your health care provider. Make sure you discuss any questions you have with your health care provider.  Use your inhaler as needed for asthma only

## 2014-10-11 NOTE — Addendum Note (Signed)
Addended by: Minette Headland A on: 10/11/2014 08:21 AM   Modules accepted: Orders

## 2014-10-12 ENCOUNTER — Other Ambulatory Visit: Payer: Self-pay

## 2015-03-01 ENCOUNTER — Encounter: Payer: Self-pay | Admitting: Family Medicine

## 2015-03-01 ENCOUNTER — Ambulatory Visit (INDEPENDENT_AMBULATORY_CARE_PROVIDER_SITE_OTHER): Payer: Medicare Other | Admitting: Family Medicine

## 2015-03-01 VITALS — BP 130/60 | HR 72 | Ht 67.0 in | Wt 214.6 lb

## 2015-03-01 DIAGNOSIS — E119 Type 2 diabetes mellitus without complications: Secondary | ICD-10-CM | POA: Diagnosis not present

## 2015-03-01 DIAGNOSIS — E1159 Type 2 diabetes mellitus with other circulatory complications: Secondary | ICD-10-CM

## 2015-03-01 DIAGNOSIS — Z720 Tobacco use: Secondary | ICD-10-CM | POA: Diagnosis not present

## 2015-03-01 DIAGNOSIS — E1169 Type 2 diabetes mellitus with other specified complication: Secondary | ICD-10-CM | POA: Diagnosis not present

## 2015-03-01 DIAGNOSIS — I152 Hypertension secondary to endocrine disorders: Secondary | ICD-10-CM

## 2015-03-01 DIAGNOSIS — J4530 Mild persistent asthma, uncomplicated: Secondary | ICD-10-CM

## 2015-03-01 DIAGNOSIS — E669 Obesity, unspecified: Secondary | ICD-10-CM | POA: Diagnosis not present

## 2015-03-01 DIAGNOSIS — I10 Essential (primary) hypertension: Secondary | ICD-10-CM | POA: Diagnosis not present

## 2015-03-01 DIAGNOSIS — F17200 Nicotine dependence, unspecified, uncomplicated: Secondary | ICD-10-CM

## 2015-03-01 LAB — POCT UA - MICROALBUMIN
Albumin/Creatinine Ratio, Urine, POC: 9
Creatinine, POC: 171.6 mg/dL
Microalbumin Ur, POC: 15.5 mg/L

## 2015-03-01 LAB — POCT GLYCOSYLATED HEMOGLOBIN (HGB A1C): Hemoglobin A1C: 6.2

## 2015-03-01 MED ORDER — FLUTICASONE PROPIONATE (INHAL) 50 MCG/BLIST IN AEPB: 1.0000 | INHALATION_SPRAY | Freq: Two times a day (BID) | RESPIRATORY_TRACT | Status: DC

## 2015-03-01 NOTE — Patient Instructions (Signed)
Use the new inhaler daily and only use the pro-air if you need to

## 2015-03-01 NOTE — Progress Notes (Signed)
   Subjective:    Patient ID: [name], female    DOB: 1956-09-14, 59 y.o.   MRN: [id number]  HPI She is here for a 4 month follow up of her type 2 diabetes. She complains of swelling and aching to the top of her right foot and anterior ankle that has been going on for "months". She states the swelling is actually much better than it has been previously and that it is usually worse in the evening and improved in the mornings.  Denies numbness, tingling to her foot and denies pain with weight bearing or walking. She has had doppler studies of that extremity was told that it was "fine". She has support stockings at home and wears them occasionally and she states these do help with the edema.  She checks her blood sugar about once per week and her readings are typically in the 90-120 range.  Medication compliance: she takes Metformin every morning but is not taking the evening dose on a regular basis. She is taking blood pressure med daily and has been using her Pro Air inhaler 2-3 times per week at night due to wheezing. She continues to smoke and is not ready to quit. She lives alone and states she is smoking due to boredom.  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 She does not use discretion with her diet and is eating a lot of fried foods.  Her last eye exam was sometime last year at a [facility].    Review of Systems  All other systems reviewed and are negative.      Objective:   Physical Exam  Alert and in no distress. Cardiac exam shows a regular sinus rhythm without murmurs or gallops. Lungs are clear to auscultation. R foot slightly edematous to the dorsal aspect, no erythema or lesions, pulse 2+ and normal sensation, non tender, full ROM, normal Achilles reflex.        Assessment & Plan:  Type 2 diabetes mellitus without complication - Plan: POCT glycosylated hemoglobin (Hb A1C), POCT UA -  Microalbumin, Ambulatory referral to Ophthalmology  Hypertension associated with diabetes  Current smoker on some days  Obesity (BMI 30-39.9)  Asthma, chronic, mild persistent, uncomplicated - Plan: fluticasone (FLOVENT DISKUS) 50 MCG/BLIST diskus inhaler  Smoking cessation discussed and she is not ready to quit. Start the steroid inhaler and use it daily and only use the [employer] as needed. Let me know if you are continuing to need the Pro Air more than 2 times per month at night. Discussed eating a healthy diet by limiting the intake of fried food and starches. Increase physical activity slowly by walking 5-10 mins at a time and gradually increasing the pace. Wear the support stockings during the day on a regular basis for LE edema. Follow up with Opthamology for an eye exam.

## 2015-03-08 ENCOUNTER — Other Ambulatory Visit: Payer: Self-pay | Admitting: Family Medicine

## 2015-03-08 ENCOUNTER — Telehealth: Payer: Self-pay | Admitting: Internal Medicine

## 2015-03-08 DIAGNOSIS — E119 Type 2 diabetes mellitus without complications: Secondary | ICD-10-CM

## 2015-03-08 MED ORDER — LISINOPRIL-HYDROCHLOROTHIAZIDE 20-12.5 MG PO TABS: 1.0000 | ORAL_TABLET | Freq: Every day | ORAL | Status: DC

## 2015-03-08 MED ORDER — METFORMIN HCL 500 MG PO TABS: 500.0000 mg | ORAL_TABLET | Freq: Two times a day (BID) | ORAL | Status: DC

## 2015-03-08 NOTE — Telephone Encounter (Signed)
Pt called and needed these meds sent in to her pharmacy

## 2015-05-04 ENCOUNTER — Other Ambulatory Visit: Payer: Self-pay | Admitting: Family Medicine

## 2015-05-07 ENCOUNTER — Telehealth: Payer: Self-pay | Admitting: Family Medicine

## 2015-05-07 MED ORDER — ALBUTEROL SULFATE HFA 108 (90 BASE) MCG/ACT IN AERS: 2.0000 | INHALATION_SPRAY | Freq: Two times a day (BID) | RESPIRATORY_TRACT | Status: DC

## 2015-05-07 NOTE — Telephone Encounter (Signed)
Fax from [facility]  proair [place]  Last filled 09/07/14

## 2015-05-14 ENCOUNTER — Encounter: Payer: Self-pay | Admitting: Internal Medicine

## 2015-06-06 ENCOUNTER — Telehealth: Payer: Self-pay | Admitting: Family Medicine

## 2015-06-06 NOTE — Telephone Encounter (Signed)
Recv'd fax from [facility] for pt's testing supplies.   She does use this company. Form in your folder

## 2015-07-19 ENCOUNTER — Other Ambulatory Visit: Payer: Self-pay

## 2015-07-19 ENCOUNTER — Encounter: Payer: Self-pay | Admitting: Family Medicine

## 2015-07-19 ENCOUNTER — Ambulatory Visit (INDEPENDENT_AMBULATORY_CARE_PROVIDER_SITE_OTHER): Payer: Medicare Other | Admitting: Family Medicine

## 2015-07-19 DIAGNOSIS — F17200 Nicotine dependence, unspecified, uncomplicated: Secondary | ICD-10-CM

## 2015-07-19 DIAGNOSIS — Z720 Tobacco use: Secondary | ICD-10-CM | POA: Diagnosis not present

## 2015-07-19 MED ORDER — VARENICLINE TARTRATE 0.5 MG X 11 & 1 MG X 42 PO MISC: ORAL | Status: DC

## 2015-07-19 NOTE — Progress Notes (Signed)
   Subjective:    Patient ID: [name], female    DOB: 1956-06-07, 59 y.o.   MRN: [id number]  HPI He is here for consultation concerning smoking. She would like to start Chantix. She has tried the patches as well as vaping and has used 800 quit now number. She started smoking again in 2001 due to stress. She smokes a half a pack per day however later in the conversation she said she can smoke up to a pack per day. She mentions boredom and eating as well as no particular reason. She also can go for 8 hours without having to light up.   Review of Systems     Objective:   Physical Exam Alert and in no distress otherwise not examined       Assessment & Plan:  Current smoker on some days - Plan: varenicline (CHANTIX STARTING MONTH PAK) 0.5 MG X 11 & 1 MG X 42 tablet I spent roughly 20 minutes explaining the difference between addiction versus habit. She do not seem to want to understand the difference between the two. Recommend she call 800 number again. I will call in Chantix. Discussed possible side effects and benefits. She will return here in one month.

## 2015-07-19 NOTE — Patient Instructions (Signed)
Until you recognize the difference between a trigger and addiction were not getting anywhere. The 800 quit now number

## 2015-08-09 ENCOUNTER — Other Ambulatory Visit: Payer: Self-pay | Admitting: Family Medicine

## 2015-08-14 ENCOUNTER — Other Ambulatory Visit: Payer: Self-pay | Admitting: Family Medicine

## 2015-08-20 ENCOUNTER — Encounter: Payer: Self-pay | Admitting: Family Medicine

## 2015-08-20 ENCOUNTER — Ambulatory Visit (INDEPENDENT_AMBULATORY_CARE_PROVIDER_SITE_OTHER): Payer: Medicare Other | Admitting: Family Medicine

## 2015-08-20 VITALS — BP 120/70 | HR 71 | Wt 208.0 lb

## 2015-08-20 DIAGNOSIS — Z720 Tobacco use: Secondary | ICD-10-CM | POA: Diagnosis not present

## 2015-08-20 DIAGNOSIS — E119 Type 2 diabetes mellitus without complications: Secondary | ICD-10-CM | POA: Diagnosis not present

## 2015-08-20 DIAGNOSIS — F17200 Nicotine dependence, unspecified, uncomplicated: Secondary | ICD-10-CM

## 2015-08-20 NOTE — Patient Instructions (Signed)
Stop the metformin entirely. Keep track of your blood sugars in that they start to go into the 150 and above range let me know. Use the Chantix and.make excuses for smoking

## 2015-08-20 NOTE — Progress Notes (Signed)
   Subjective:    Patient ID: [name], female    DOB: July 05, 1956, 59 y.o.   MRN: [id number]  HPI She is here for consultation concerning smoking. She is now on Chantix and has been on this for 2 weeks. She states that she does note more dreams but they are not bad. She has not set a quit date. She then went on to list several reasons why she wants to continue to smoke. She really has not set up game plan for how to handle all these different triggers. It again was very difficult for her to stay on task as she change the subject quite often. She is now on the twice a day regular dosing of Chantix. She is planning a trip to [place] and then when on to explain that she was going to buy several packs of cigarettes so when she gets to your she will have them so she doesn't have to pay for the high cost in [place]. She then changed to subject to talk about diabetes. She has lost a significant weight and over the last several months has been taking intermittent doses of the metformin. She states her blood sugarHas been the low 100s.   Review of Systems     Objective:   Physical Exam Alert and in no distress otherwise not examined       Assessment & Plan:  Current smoker on some days  Type 2 diabetes mellitus without complication I again explained to her in detail that she is making excuses for continuing to smoke using multiple reasons including living by herself, boredom, driving in the car. When I challenged her to have other options available she change the subject. I then extended slight told her that she was not ready to quit smoking. She again change the subject to diabetes. Recommend she stop taking the metformin entirely and to not make any changes in her dosing without checking with me. If her blood sugar gets above 150, she is to call me. Otherwise I will recheck with her with her November visit. At the end of the encounter she then mentions something about abdominal  distress. I encouraged her to set up another appointment to discuss this as needed. Over 25 minutes, greater than 50% spent in counseling and coordination of care.

## 2015-09-06 ENCOUNTER — Ambulatory Visit (INDEPENDENT_AMBULATORY_CARE_PROVIDER_SITE_OTHER): Payer: Medicare Other | Admitting: Family Medicine

## 2015-09-06 ENCOUNTER — Encounter: Payer: Self-pay | Admitting: Family Medicine

## 2015-09-06 VITALS — BP 120/70 | HR 94 | Ht 67.0 in | Wt 205.0 lb

## 2015-09-06 DIAGNOSIS — L84 Corns and callosities: Secondary | ICD-10-CM

## 2015-09-06 DIAGNOSIS — Z1383 Encounter for screening for respiratory disorder NEC: Secondary | ICD-10-CM | POA: Diagnosis not present

## 2015-09-06 NOTE — Progress Notes (Signed)
   Subjective:    Patient ID: [name], female    DOB: 11-26-1956, 59 y.o.   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 She does have underlying diabetes as well as hypertension otherwise is doing quite well. She will need a skin test. She also continues to have difficulty with bilateral foot discomfort. She has a history of calluses and has used orthotics in the past. Apparently they have worn out and she has developed calluses again.   Review of Systems     Objective:   Physical Exam Alert and in no distress. Exam of both feet does show callus formation over the first and fifth MTP joints bilaterally.       Assessment & Plan:  Screening for respiratory condition - Plan: PPD  Callus of foot - Plan: Ambulatory referral to Podiatry  is time for some new orthotics She will schedule to come back for a PPD so we can read it.

## 2015-09-10 ENCOUNTER — Other Ambulatory Visit (INDEPENDENT_AMBULATORY_CARE_PROVIDER_SITE_OTHER): Payer: Medicare Other

## 2015-09-10 DIAGNOSIS — Z111 Encounter for screening for respiratory tuberculosis: Secondary | ICD-10-CM

## 2015-09-12 LAB — TB SKIN TEST
INDURATION: 0 mm
TB SKIN TEST: NEGATIVE

## 2015-09-18 ENCOUNTER — Telehealth: Payer: Self-pay | Admitting: Family Medicine

## 2015-09-18 NOTE — Telephone Encounter (Signed)
Left message for pt to call. Still have forms that can not be completed due to the fact that PPD was never read. Pt needs to reschedule.

## 2015-09-18 NOTE — Addendum Note (Signed)
Addended by: Randel Books on: 09/18/2015 04:51 PM   Modules accepted: Orders

## 2015-09-27 ENCOUNTER — Other Ambulatory Visit: Payer: Self-pay | Admitting: Family Medicine

## 2015-09-27 MED ORDER — VARENICLINE TARTRATE 0.5 MG PO TABS: 0.5000 mg | ORAL_TABLET | Freq: Two times a day (BID) | ORAL | Status: DC

## 2015-09-27 NOTE — Telephone Encounter (Signed)
Is this okay?

## 2015-10-02 ENCOUNTER — Ambulatory Visit (INDEPENDENT_AMBULATORY_CARE_PROVIDER_SITE_OTHER): Payer: Medicare Other | Admitting: Podiatry

## 2015-10-02 ENCOUNTER — Ambulatory Visit: Payer: Medicare Other | Admitting: Podiatry

## 2015-10-02 ENCOUNTER — Encounter: Payer: Self-pay | Admitting: Podiatry

## 2015-10-02 VITALS — BP 136/60 | HR 76 | Resp 16 | Ht 67.0 in | Wt 205.0 lb

## 2015-10-02 DIAGNOSIS — M216X9 Other acquired deformities of unspecified foot: Secondary | ICD-10-CM | POA: Diagnosis not present

## 2015-10-02 DIAGNOSIS — L84 Corns and callosities: Secondary | ICD-10-CM

## 2015-10-02 NOTE — Progress Notes (Signed)
   Subjective:    Patient ID: [name], female    DOB: 09-21-1956, 59 y.o.   MRN: [id number]  HPI Pt presents with bilateral callouses, ball of feet. This has been going on for years. Pt stated, "constantly soaking feet to help get callous off".   Review of Systems  Respiratory: Positive for shortness of breath.   Musculoskeletal: Positive for back pain, arthralgias and gait problem.  All other systems reviewed and are negative.      Objective:   Physical Exam        Assessment & Plan:

## 2015-10-05 NOTE — Progress Notes (Signed)
Subjective:     Patient ID: [name], female   DOB: 1956/11/10, 59 y.o.   MRN: [id number]  HPI patient states she has severe calluses on the bottom of both feet that been going on for years and she tries to soak it and trim them as best she can and it simply does not give her relief. States that they're gradually getting worse and becoming increasingly difficult to walk with   Review of Systems  All other systems reviewed and are negative.      Objective:   Physical Exam  Constitutional: She is oriented to person, place, and time.  Cardiovascular: Intact distal pulses.   Musculoskeletal: Normal range of motion.  Neurological: She is oriented to person, place, and time.  Skin: Skin is warm.  Vitals reviewed.  neurovascular status found to be intact muscle strength was adequate range of motion was moderately diminished subtalar midtarsal joint. Significant cavus foot deformity was noted bilateral with keratotic lesions sub-first and sub-fifth of both feet that are painful when pressed and making walking difficult. 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 Deep debridement accomplished and explained they will regrow and we will evaluate depending on that if anything more can be done to help this patient. Patient will be seen back to recheck

## 2015-10-16 ENCOUNTER — Other Ambulatory Visit (HOSPITAL_COMMUNITY)
Admission: RE | Admit: 2015-10-16 | Discharge: 2015-10-16 | Disposition: A | Discharge status: Home / Self Care | Payer: Medicaid Other | Source: Ambulatory Visit | Attending: Family Medicine | Admitting: Family Medicine

## 2015-10-16 ENCOUNTER — Ambulatory Visit (INDEPENDENT_AMBULATORY_CARE_PROVIDER_SITE_OTHER): Payer: Medicare Other | Admitting: Family Medicine

## 2015-10-16 ENCOUNTER — Encounter: Payer: Medicare Other | Admitting: Family Medicine

## 2015-10-16 ENCOUNTER — Encounter: Payer: Self-pay | Admitting: Family Medicine

## 2015-10-16 VITALS — BP 120/60 | HR 63 | Ht 67.0 in | Wt 204.0 lb

## 2015-10-16 DIAGNOSIS — E118 Type 2 diabetes mellitus with unspecified complications: Secondary | ICD-10-CM | POA: Diagnosis not present

## 2015-10-16 DIAGNOSIS — Z0000 Encounter for general adult medical examination without abnormal findings: Secondary | ICD-10-CM | POA: Diagnosis not present

## 2015-10-16 DIAGNOSIS — E669 Obesity, unspecified: Secondary | ICD-10-CM | POA: Diagnosis not present

## 2015-10-16 DIAGNOSIS — I152 Hypertension secondary to endocrine disorders: Secondary | ICD-10-CM

## 2015-10-16 DIAGNOSIS — Z720 Tobacco use: Secondary | ICD-10-CM

## 2015-10-16 DIAGNOSIS — K219 Gastro-esophageal reflux disease without esophagitis: Secondary | ICD-10-CM

## 2015-10-16 DIAGNOSIS — Z01419 Encounter for gynecological examination (general) (routine) without abnormal findings: Secondary | ICD-10-CM | POA: Insufficient documentation

## 2015-10-16 DIAGNOSIS — E1159 Type 2 diabetes mellitus with other circulatory complications: Secondary | ICD-10-CM

## 2015-10-16 DIAGNOSIS — Z86010 Personal history of colonic polyps: Secondary | ICD-10-CM

## 2015-10-16 DIAGNOSIS — J4530 Mild persistent asthma, uncomplicated: Secondary | ICD-10-CM | POA: Diagnosis not present

## 2015-10-16 DIAGNOSIS — I10 Essential (primary) hypertension: Secondary | ICD-10-CM

## 2015-10-16 DIAGNOSIS — F17200 Nicotine dependence, unspecified, uncomplicated: Secondary | ICD-10-CM

## 2015-10-16 LAB — COMPREHENSIVE METABOLIC PANEL
ALK PHOS: 48 U/L (ref 33–130)
ALT: 14 U/L (ref 6–29)
AST: 18 U/L (ref 10–35)
Albumin: 4.4 g/dL (ref 3.6–5.1)
BUN: 18 mg/dL (ref 7–25)
CALCIUM: 9.5 mg/dL (ref 8.6–10.4)
CHLORIDE: 100 mmol/L (ref 98–110)
CO2: 27 mmol/L (ref 20–31)
Creat: 0.99 mg/dL (ref 0.50–0.99)
GLUCOSE: 101 mg/dL — AB (ref 65–99)
POTASSIUM: 3.9 mmol/L (ref 3.5–5.3)
Sodium: 137 mmol/L (ref 135–146)
TOTAL PROTEIN: 7.3 g/dL (ref 6.1–8.1)
Total Bilirubin: 0.5 mg/dL (ref 0.2–1.2)

## 2015-10-16 LAB — CBC WITH DIFFERENTIAL/PLATELET
BASOS ABS: 0.1 10*3/uL (ref 0.0–0.1)
Basophils Relative: 1 % (ref 0–1)
EOS ABS: 0.2 10*3/uL (ref 0.0–0.7)
EOS PCT: 2 % (ref 0–5)
HEMATOCRIT: 37.9 % (ref 36.0–46.0)
Hemoglobin: 12.7 g/dL (ref 12.0–15.0)
LYMPHS ABS: 3.4 10*3/uL (ref 0.7–4.0)
LYMPHS PCT: 37 % (ref 12–46)
MCH: 30.5 pg (ref 26.0–34.0)
MCHC: 33.5 g/dL (ref 30.0–36.0)
MCV: 90.9 fL (ref 78.0–100.0)
MONOS PCT: 8 % (ref 3–12)
MPV: 9.7 fL (ref 8.6–12.4)
Monocytes Absolute: 0.7 10*3/uL (ref 0.1–1.0)
Neutro Abs: 4.7 10*3/uL (ref 1.7–7.7)
Neutrophils Relative %: 52 % (ref 43–77)
PLATELETS: 382 10*3/uL (ref 150–400)
RBC: 4.17 MIL/uL (ref 3.87–5.11)
RDW: 13.1 % (ref 11.5–15.5)
WBC: 9.1 10*3/uL (ref 4.0–10.5)

## 2015-10-16 LAB — LIPID PANEL
CHOL/HDL RATIO: 2.9 ratio (ref <=5.0)
CHOLESTEROL: 164 mg/dL (ref 125–200)
HDL: 56 mg/dL (ref >=46)
LDL Cholesterol: 96 mg/dL (ref <130)
Triglycerides: 61 mg/dL (ref <150)
VLDL: 12 mg/dL (ref <30)

## 2015-10-16 LAB — POCT GLYCOSYLATED HEMOGLOBIN (HGB A1C): Hemoglobin A1C: 6.2

## 2015-10-16 NOTE — Progress Notes (Signed)
Subjective:    Patient ID: [name], female    DOB: [birth year], 59 y.o.   MRN: [id number]  HPI She is here for a complete examination. He does have underlying diabetes and presently is using a weight loss medication that she has gotten through Dr. Irena Cords apparently is working. Been off metformin for several months and has been checking her blood sugars periodically and intermittently low 100s. She does check her feet regularly and we'll get an eye exam. She continues on her asthma medications and is having no difficulty with this. She is now using Chantix for her cigarette smoking is down to 5 cigarettes per day but does not have a plan to how the get off of them completely. She has a previous history of colonic polyps and was to return several years ago. She also has not had a recent mammogram. Her reflux is under good control. We did not discuss her chronic pain issues. Family and social history as well as health maintenance and immunizations was reviewed.   Review of Systems  All other systems reviewed and are negative.      Objective:   Physical Exam BP 120/60 mmHg  Pulse 63  Ht 5\' 7"  (1.702 m)  Wt 204 lb (92.534 kg)  BMI 31.94 kg/m2  SpO2 97%  General Appearance:    Alert, cooperative, no distress, appears stated age  Head:    Normocephalic, without obvious abnormality, atraumatic  Eyes:    PERRL, conjunctiva/corneas clear, EOM's intact, fundi    benign  Ears:    Normal TM's and external ear canals  Nose:   Nares normal, mucosa normal, no drainage or sinus   tenderness  Throat:   Lips, mucosa, and tongue normal; teeth and gums normal  Neck:   Supple, no lymphadenopathy;  thyroid:  no   enlargement/tenderness/nodules; no carotid   bruit or JVD  Back:    Spine nontender, no curvature, ROM normal, no CVA     tenderness  Lungs:     Clear to auscultation bilaterally without wheezes, rales or     ronchi; respirations unlabored  Chest Wall:    No tenderness or deformity   Heart:    Regular rate and rhythm, S1 and S2 normal, no murmur, rub   or gallop  Breast Exam:    No tenderness, masses, or nipple discharge or inversion.      No axillary lymphadenopathy  Abdomen:     Soft, non-tender, nondistended, normoactive bowel sounds,    no masses, no hepatosplenomegaly  Genitalia:    Normal external genitalia without lesions.  BUS and vagina normal; cervix not well visualized cervical motion tenderness. No abnormal vaginal discharge.  Uterus and adnexa not well evaluated due to to her large size, nontender, no masses.  Pap performed  Rectal:    Not performed due to age<40 and no related complaints  Extremities:   No clubbing, cyanosis or edema  Pulses:   2+ and symmetric all extremities  Skin:   Skin color, texture, turgor normal, no rashes or lesions  Lymph nodes:   Cervical, supraclavicular, and axillary nodes normal  Neurologic:   CNII-XII intact, normal strength, sensation and gait; reflexes 2+ and symmetric throughout          Psych:   Normal mood, affect, hygiene and grooming.          Assessment & Plan:  Routine general medical examination at a health care facility - Plan: CBC with Differential/Platelet, Comprehensive metabolic panel,  Lipid panel, POCT UA - Microalbumin  Current smoker on some days  Type 2 diabetes mellitus with complication, without long-term current use of insulin ([place]) - Plan: CBC with Differential/Platelet, Comprehensive metabolic panel, Lipid panel, POCT UA - Microalbumin  Hypertension associated with diabetes ([place]) - Plan: CBC with Differential/Platelet, Comprehensive metabolic panel  Gastroesophageal reflux disease without esophagitis  History of colonic polyps - Plan: Ambulatory referral to Gastroenterology  Obesity (BMI 30-39.9)  Asthma, chronic, mild persistent, uncomplicated  will continue on her present medications. I will do routine blood screening, refer back to GI for colonoscopy. Discussed smoking cessation with her  and essentially asked her to taper by one cigarette per week until she is off completely and replacing this was something positive. She will continue on all of her other medications.

## 2015-10-16 NOTE — Addendum Note (Signed)
Addended by: Randel Books on: 10/16/2015 03:57 PM   Modules accepted: Orders

## 2015-10-17 ENCOUNTER — Other Ambulatory Visit: Payer: Self-pay

## 2015-10-17 DIAGNOSIS — Z1231 Encounter for screening mammogram for malignant neoplasm of breast: Secondary | ICD-10-CM

## 2015-10-17 MED ORDER — ATORVASTATIN CALCIUM 10 MG PO TABS: 10.0000 mg | ORAL_TABLET | Freq: Every day | ORAL | Status: DC

## 2015-10-17 MED ORDER — LISINOPRIL-HYDROCHLOROTHIAZIDE 20-12.5 MG PO TABS: 1.0000 | ORAL_TABLET | Freq: Every day | ORAL | Status: DC

## 2015-10-17 NOTE — Progress Notes (Signed)
   Subjective:    Patient ID: [name], female    DOB: [birth year], 59 y.o.   MRN: [id number]  HPI    Review of Systems     Objective:   Physical Exam        Assessment & Plan:  Blood work was reviewed. I will place her on low-dose Lipitor and renew her lisinopril

## 2015-10-17 NOTE — Addendum Note (Signed)
Addended by: [name] on: 10/17/2015 07:52 AM   Modules accepted: Orders

## 2015-10-18 LAB — CYTOLOGY - PAP

## 2015-11-04 ENCOUNTER — Other Ambulatory Visit: Payer: Self-pay | Admitting: Family Medicine

## 2015-11-05 NOTE — Telephone Encounter (Signed)
Is this okay to refill? 

## 2015-11-06 ENCOUNTER — Ambulatory Visit: Payer: Medicaid Other

## 2015-11-12 ENCOUNTER — Telehealth: Payer: Self-pay | Admitting: Family Medicine

## 2015-11-12 ENCOUNTER — Encounter: Payer: Self-pay | Admitting: Internal Medicine

## 2015-11-12 NOTE — Telephone Encounter (Signed)
Called patient, LM that she would need an appt to see the Dr before he would prescribe ABX.

## 2015-11-12 NOTE — Telephone Encounter (Signed)
Pt called requesting a antibiotic, said she has had a bad cough,sneezing, runny nose,and has had some mucous coming out. Has not felt good in about a week, was wondering if you would send send her something in instead of her coming in since she was just here in November, pt can be reached at [phone] and pt uses [facility] [place], [place] - [address]

## 2015-11-12 NOTE — Telephone Encounter (Signed)
She will need to be seen

## 2015-11-14 ENCOUNTER — Encounter: Payer: Self-pay | Admitting: Family Medicine

## 2015-11-14 ENCOUNTER — Ambulatory Visit (INDEPENDENT_AMBULATORY_CARE_PROVIDER_SITE_OTHER): Payer: Medicare Other | Admitting: Family Medicine

## 2015-11-14 VITALS — BP 124/64 | HR 64 | Temp 98.2°F | Wt 205.0 lb

## 2015-11-14 DIAGNOSIS — R05 Cough: Secondary | ICD-10-CM | POA: Diagnosis not present

## 2015-11-14 DIAGNOSIS — R059 Cough, unspecified: Secondary | ICD-10-CM

## 2015-11-14 DIAGNOSIS — J069 Acute upper respiratory infection, unspecified: Secondary | ICD-10-CM | POA: Diagnosis not present

## 2015-11-14 MED ORDER — AZITHROMYCIN 250 MG PO TABS: ORAL_TABLET | ORAL | Status: DC

## 2015-11-14 NOTE — Patient Instructions (Signed)
If you are not back to baseline after finishing the antibiotic let us know. If you are continuing to need your albuterol inhaler more than once a week at night let us know. Upper Respiratory Infection, Adult Most upper respiratory infections (URIs) are a viral infection of the air passages leading to the lungs. A URI affects the nose, throat, and upper air passages. The most common type of URI is nasopharyngitis and is typically referred to as "the common cold." URIs run their course and usually go away on their own. Most of the time, a URI does not require medical attention, but sometimes a bacterial infection in the upper airways can follow a viral infection. This is called a secondary infection. Sinus and middle ear infections are common types of secondary upper respiratory infections. Bacterial pneumonia can also complicate a URI. A URI can worsen asthma and chronic obstructive pulmonary disease (COPD). Sometimes, these complications can require emergency medical care and may be life threatening.  CAUSES Almost all URIs are caused by viruses. A virus is a type of germ and can spread from one person to another.  RISKS FACTORS You may be at risk for a URI if:   You smoke.   You have chronic heart or lung disease.  You have a weakened defense (immune) system.   You are very young or very old.   You have nasal allergies or asthma.  You work in crowded or poorly ventilated areas.  You work in health care facilities or schools. SIGNS AND SYMPTOMS  Symptoms typically develop 2-3 days after you come in contact with a cold virus. Most viral URIs last 7-10 days. However, viral URIs from the influenza virus (flu virus) can last 14-18 days and are typically more severe. Symptoms may include:   Runny or stuffy (congested) nose.   Sneezing.   Cough.   Sore throat.   Headache.   Fatigue.   Fever.   Loss of appetite.   Pain in your forehead, behind your eyes, and over your  cheekbones (sinus pain).  Muscle aches.  DIAGNOSIS  Your health care provider may diagnose a URI by:  Physical exam.  Tests to check that your symptoms are not due to another condition such as:  Strep throat.  Sinusitis.  Pneumonia.  Asthma. TREATMENT  A URI goes away on its own with time. It cannot be cured with medicines, but medicines may be prescribed or recommended to relieve symptoms. Medicines may help:  Reduce your fever.  Reduce your cough.  Relieve nasal congestion. HOME CARE INSTRUCTIONS   Take medicines only as directed by your health care provider.   Gargle warm saltwater or take cough drops to comfort your throat as directed by your health care provider.  Use a warm mist humidifier or inhale steam from a shower to increase air moisture. This may make it easier to breathe.  Drink enough fluid to keep your urine clear or pale yellow.   Eat soups and other clear broths and maintain good nutrition.   Rest as needed.   Return to work when your temperature has returned to normal or as your health care provider advises. You may need to stay home longer to avoid infecting others. You can also use a face mask and careful hand washing to prevent spread of the virus.  Increase the usage of your inhaler if you have asthma.   Do not use any tobacco products, including cigarettes, chewing tobacco, or electronic cigarettes. If you need help quitting,  ask your health care provider. PREVENTION  The best way to protect yourself from getting a cold is to practice good hygiene.   Avoid oral or hand contact with people with cold symptoms.   Wash your hands often if contact occurs.  There is no clear evidence that vitamin C, vitamin E, echinacea, or exercise reduces the chance of developing a cold. However, it is always recommended to get plenty of rest, exercise, and practice good nutrition.  SEEK MEDICAL CARE IF:   You are getting worse rather than better.    Your symptoms are not controlled by medicine.   You have chills.  You have worsening shortness of breath.  You have brown or red mucus.  You have yellow or brown nasal discharge.  You have pain in your face, especially when you bend forward.  You have a fever.  You have swollen neck glands.  You have pain while swallowing.  You have white areas in the back of your throat. SEEK IMMEDIATE MEDICAL CARE IF:   You have severe or persistent:  Headache.  Ear pain.  Sinus pain.  Chest pain.  You have chronic lung disease and any of the following:  Wheezing.  Prolonged cough.  Coughing up blood.  A change in your usual mucus.  You have a stiff neck.  You have changes in your:  Vision.  Hearing.  Thinking.  Mood. MAKE SURE YOU:   Understand these instructions.  Will watch your condition.  Will get help right away if you are not doing well or get worse.   This information is not intended to replace advice given to you by your health care provider. Make sure you discuss any questions you have with your health care provider.   Document Released: 05/13/2001 Document Revised: 04/03/2015 Document Reviewed: 02/22/2014 Elsevier Interactive Patient Education [employer].

## 2015-11-14 NOTE — Progress Notes (Signed)
   Subjective:    Patient ID: [name], female    DOB: 08/23/1956, 59 y.o.   MRN: [id number]  HPI Chief Complaint  Patient presents with  . cold    coughing, sneezing, running nose and mucous. cold sore   She is here with complaints of a 1 week history of dry cough that has progressed to productive cough, nasal congestion, purulent nasal drainage and occasional wheezing.  She reports feeling some better but still has cough,  intermittent chills, no documented fever. Denies body aches, ear pain, sore throat, chest tightness, shortness of breath.  Has been taking multiple over the counter cold medications. She stopped taking her Chantix since having the cold. She is smoking 2-3 cigarettes per day.  History of asthma and using albuterol inhaler, has used it almost nightly for past week.   Reviewed allergies, medications, past medical and social history.   Review of Systems Pertinent positives and negatives in the history of present illness.    Objective:   Physical Exam BP 124/64 mmHg  Pulse 64  Temp(Src) 98.2 F (36.8 C) (Oral)  Wt 205 lb (92.987 kg) Alert and in no distress. Mild maxillary sinus tenderness otherwise normal. Tympanic membranes and canals are normal. Pharyngeal area is erythematous without edema or exudate. Neck is supple without adenopathy. Cardiac exam shows a regular sinus rhythm without murmurs or gallops. Lungs with wheezing to RUL and RLL, otherwise are clear.      Assessment & Plan:  Acute upper respiratory infection - Plan: azithromycin (ZITHROMAX) 250 MG tablet  Cough - Plan: azithromycin (ZITHROMAX) 250 MG tablet  Recommend that she start the antibiotic today, stay well-hydrated and continue symptomatic treatment. She states she has not been taking her fluticasone, she has been out of it and will start taking that today as well. Recommend that she let us know if she is continuing to need albuterol inhaler daily, expect that she will need this  less often as her URI illness resolves. Offered breathing treatment in office today for some mild-moderate wheezing to right lung fields, she declined and states she has nebulizer at home.  Also recommend that she stop smoking. She is not wanting to discuss this today.

## 2015-11-15 ENCOUNTER — Ambulatory Visit: Payer: Medicare Other | Admitting: Family Medicine

## 2015-11-20 ENCOUNTER — Other Ambulatory Visit: Payer: Self-pay | Admitting: Family Medicine

## 2015-11-23 ENCOUNTER — Ambulatory Visit
Admission: RE | Admit: 2015-11-23 | Discharge: 2015-11-23 | Disposition: A | Discharge status: Home / Self Care | Payer: Medicaid Other | Source: Ambulatory Visit

## 2015-11-23 ENCOUNTER — Ambulatory Visit: Payer: Medicaid Other

## 2015-11-23 DIAGNOSIS — Z1231 Encounter for screening mammogram for malignant neoplasm of breast: Secondary | ICD-10-CM | POA: Diagnosis not present

## 2015-11-26 ENCOUNTER — Emergency Department (HOSPITAL_COMMUNITY): Payer: Medicare Other

## 2015-11-26 ENCOUNTER — Emergency Department (HOSPITAL_COMMUNITY)
Admission: EM | Admit: 2015-11-26 | Discharge: 2015-11-26 | Disposition: A | Discharge status: Home / Self Care | Payer: Medicare Other | Attending: Emergency Medicine | Admitting: Emergency Medicine

## 2015-11-26 ENCOUNTER — Encounter (HOSPITAL_COMMUNITY): Payer: Self-pay

## 2015-11-26 DIAGNOSIS — F17210 Nicotine dependence, cigarettes, uncomplicated: Secondary | ICD-10-CM | POA: Insufficient documentation

## 2015-11-26 DIAGNOSIS — J45901 Unspecified asthma with (acute) exacerbation: Secondary | ICD-10-CM

## 2015-11-26 DIAGNOSIS — J069 Acute upper respiratory infection, unspecified: Secondary | ICD-10-CM | POA: Diagnosis not present

## 2015-11-26 DIAGNOSIS — J45909 Unspecified asthma, uncomplicated: Secondary | ICD-10-CM | POA: Diagnosis present

## 2015-11-26 DIAGNOSIS — R0981 Nasal congestion: Secondary | ICD-10-CM | POA: Diagnosis not present

## 2015-11-26 DIAGNOSIS — Z79899 Other long term (current) drug therapy: Secondary | ICD-10-CM | POA: Diagnosis not present

## 2015-11-26 DIAGNOSIS — E669 Obesity, unspecified: Secondary | ICD-10-CM | POA: Insufficient documentation

## 2015-11-26 DIAGNOSIS — Z7951 Long term (current) use of inhaled steroids: Secondary | ICD-10-CM | POA: Diagnosis not present

## 2015-11-26 DIAGNOSIS — R05 Cough: Secondary | ICD-10-CM | POA: Diagnosis not present

## 2015-11-26 DIAGNOSIS — Z86010 Personal history of colonic polyps: Secondary | ICD-10-CM | POA: Insufficient documentation

## 2015-11-26 MED ORDER — IPRATROPIUM BROMIDE 0.02 % IN SOLN: 0.5000 mg | Freq: Once | RESPIRATORY_TRACT | Status: DC

## 2015-11-26 MED ORDER — ALBUTEROL SULFATE (2.5 MG/3ML) 0.083% IN NEBU: 5.0000 mg | INHALATION_SOLUTION | Freq: Once | RESPIRATORY_TRACT | Status: DC

## 2015-11-26 MED ORDER — PREDNISONE 20 MG PO TABS
60.0000 mg | ORAL_TABLET | Freq: Once | ORAL | Status: AC
  Administered 2015-11-26: 60 mg via ORAL
  Filled 2015-11-26: qty 3

## 2015-11-26 MED ORDER — PREDNISONE 20 MG PO TABS
40.0000 mg | ORAL_TABLET | Freq: Every day | ORAL | Status: DC
Start: 2015-11-26 — End: 2016-01-09

## 2015-11-26 MED ORDER — PREDNISONE 20 MG PO TABS: 60.0000 mg | ORAL_TABLET | Freq: Once | ORAL | Status: DC

## 2015-11-26 MED ORDER — ALBUTEROL SULFATE (2.5 MG/3ML) 0.083% IN NEBU: 2.5000 mg | INHALATION_SOLUTION | RESPIRATORY_TRACT | Status: DC | PRN

## 2015-11-26 MED ORDER — IPRATROPIUM-ALBUTEROL 0.5-2.5 (3) MG/3ML IN SOLN
3.0000 mL | Freq: Once | RESPIRATORY_TRACT | Status: AC
  Administered 2015-11-26: 3 mL via RESPIRATORY_TRACT
  Filled 2015-11-26: qty 3

## 2015-11-26 NOTE — ED Notes (Signed)
Finished Treatment.. Waiting for XRay

## 2015-11-26 NOTE — Discharge Instructions (Signed)
Asthma, Adult Asthma is a condition of the lungs in which the airways tighten and narrow. Asthma can make it hard to breathe. Asthma cannot be cured, but medicine and lifestyle changes can help control it. Asthma may be started (triggered) by:  Animal skin flakes (dander).  Dust.  Cockroaches.  Pollen.  Mold.  Smoke.  Cleaning products.  Hair sprays or aerosol sprays.  Paint fumes or strong smells.  Cold air, weather changes, and winds.  Crying or laughing hard.  Stress.  Certain medicines or drugs.  Foods, such as dried fruit, potato chips, and sparkling grape juice.  Infections or conditions (colds, flu).  Exercise.  Certain medical conditions or diseases.  Exercise or tiring activities. HOME CARE   Take medicine as told by your doctor.  Use a peak flow meter as told by your doctor. A peak flow meter is a tool that measures how well the lungs are working.  Record and keep track of the peak flow meter's readings.  Understand and use the asthma action plan. An asthma action plan is a written plan for taking care of your asthma and treating your attacks.  To help prevent asthma attacks:  Do not smoke. Stay away from secondhand smoke.  Change your heating and air conditioning filter often.  Limit your use of fireplaces and wood stoves.  Get rid of pests (such as roaches and mice) and their droppings.  Throw away plants if you see mold on them.  Clean your floors. Dust regularly. Use cleaning products that do not smell.  Have someone vacuum when you are not home. Use a vacuum cleaner with a HEPA filter if possible.  Replace carpet with wood, tile, or vinyl flooring. Carpet can trap animal skin flakes and dust.  Use allergy-proof pillows, mattress covers, and box spring covers.  Wash bed sheets and blankets every week in hot water and dry them in a dryer.  Use blankets that are made of polyester or cotton.  Clean bathrooms and kitchens with bleach.  If possible, have someone repaint the walls in these rooms with mold-resistant paint. Keep out of the rooms that are being cleaned and painted.  Wash hands often. GET HELP IF:  You have make a whistling sound when breaking (wheeze), have shortness of breath, or have a cough even if taking medicine to prevent attacks.  The colored mucus you cough up (sputum) is thicker than usual.  The colored mucus you cough up changes from clear or white to yellow, green, gray, or bloody.  You have problems from the medicine you are taking such as:  A rash.  Itching.  Swelling.  Trouble breathing.  You need reliever medicines more than 2-3 times a week.  Your peak flow measurement is still at 50-79% of your personal best after following the action plan for 1 hour.  You have a fever. GET HELP RIGHT AWAY IF:   You seem to be worse and are not responding to medicine during an asthma attack.  You are short of breath even at rest.  You get short of breath when doing very little activity.  You have trouble eating, drinking, or talking.  You have chest pain.  You have a fast heartbeat.  Your lips or fingernails start to turn blue.  You are light-headed, dizzy, or faint.  Your peak flow is less than 50% of your personal best.   This information is not intended to replace advice given to you by your health care provider. Make sure   you discuss any questions you have with your health care provider.   Document Released: 05/05/2008 Document Revised: 08/08/2015 Document Reviewed: 06/16/2013 Elsevier Interactive Patient Education 2016 Elsevier Inc.  

## 2015-11-26 NOTE — ED Notes (Signed)
Per MD, Pt states, I was sick around Thanksgiving and never officially went away. On Wednesday, pt reports asthma starting to get worse with cough and congestion from the work before then. Pt was given antibiotics with no relief.

## 2015-11-26 NOTE — ED Provider Notes (Signed)
CSN: [id number]     Arrival date & time 11/26/15  1740 History   First MD Initiated Contact with Patient 11/26/15 1008     Chief Complaint  Patient presents with  . Asthma     (Consider location/radiation/quality/duration/timing/severity/associated sxs/prior Treatment) HPI Comments: She state that she was treated 1 month ago with zithromax for the same symptoms and it isn't getting any better. Has used her inhaler once a day.no fever  Patient is a 59 y.o. female presenting with asthma. The history is provided by the patient. No language interpreter was used.  Asthma This is a recurrent problem. The current episode started 1 to 4 weeks ago. The problem occurs constantly. The problem has been gradually worsening. Associated symptoms include congestion. Pertinent negatives include no fever. Nothing aggravates the symptoms.    Past Medical History  Diagnosis Date  . Asthma   . Obesity   . Smoker   . Colonic polyp   . Pain in limb   . Swelling of limb    Past Surgical History  Procedure Laterality Date  . Colonoscopy  2009    Dr. Henrene Pastor   No family history on file. Social History  Substance Use Topics  . Smoking status: Current Every Day Smoker -- 0.50 packs/day for 10 years    Types: Cigarettes  . Smokeless tobacco: None  . Alcohol Use: No   OB History    No data available     Review of Systems  Constitutional: Negative for fever.  HENT: Positive for congestion.   All other systems reviewed and are negative.     Allergies  Review of patient's allergies indicates no known allergies.  Home Medications   Prior to Admission medications   Medication Sig Start Date End Date Taking? Authorizing Provider  ACCU-CHEK AVIVA PLUS test strip USE STRIP TO CHECK GLUCOSE DAILY 08/15/15   [name], MD  albuterol (PROAIR HFA) 108 (90 BASE) MCG/ACT inhaler Inhale 2 puffs into the lungs 2 (two) times daily. 05/07/15   [name], MD  Aspirin-Salicylamide-Caffeine [facility]  HEADACHE POWDER PO) Take 1 packet by mouth as needed.      Historical Provider, MD  atorvastatin (LIPITOR) 10 MG tablet Take 1 tablet (10 mg total) by mouth daily. Patient not taking: Reported on 11/14/2015 10/17/15   [name], MD  azithromycin (ZITHROMAX) 250 MG tablet 2 tablets on day 1, intake 1 tablet on days 2 through 5. 11/14/15   Girtha Rm, NP  Blood Glucose Monitoring Suppl (ACCU-CHEK AVIVA PLUS) W/DEVICE KIT USE AS DIRECTED TO CHECK BLOOD GLUCOSE 1 TO 2 TIMES DAILY 08/10/15   [name], MD  CHANTIX STARTING MONTH PAK 0.5 MG X 11 & 1 MG X 42 tablet TAKE AS DIRECTED PER  PACKAGE  INSTRUCTIONS Patient not taking: Reported on 11/14/2015 09/27/15   [name], MD  fluticasone (FLOVENT DISKUS) 50 MCG/BLIST diskus inhaler Inhale 1 puff into the lungs 2 (two) times daily. 03/01/15   [name], MD  ibuprofen (ADVIL,MOTRIN) 800 MG tablet TAKE ONE TABLET BY MOUTH EVERY 8 HOURS AS NEEDED 11/05/15   [name], MD  lisinopril-hydrochlorothiazide (PRINZIDE,ZESTORETIC) 20-12.5 MG tablet Take 1 tablet by mouth daily. 10/17/15   [name], MD  PROAIR HFA 108 [phone] BASE) MCG/ACT inhaler INHALE TWO PUFFS BY MOUTH TWICE DAILY 11/21/15   [name], MD  varenicline (CHANTIX) 0.5 MG tablet Take 1 tablet (0.5 mg total) by mouth 2 (two) times daily. 09/27/15  [name], MD   BP 152/75 mmHg  Pulse 86  Temp(Src) 98.7 F (37.1 C) (Oral)  Resp 18  Ht 5' 7"  (1.702 m)  Wt 92.534 kg  BMI 31.94 kg/m2  SpO2 99% Physical Exam  Constitutional: She is oriented to person, place, and time. She appears well-developed and well-nourished.  HENT:  Head: Normocephalic and atraumatic.  Right Ear: External ear normal.  Left Ear: External ear normal.  Nose: Rhinorrhea present.  Cardiovascular: Normal rate and regular rhythm.   Pulmonary/Chest: Effort normal. She has wheezes.  Abdominal: Soft. There is no tenderness.  Musculoskeletal: Normal range of motion.  Neurological: She is  alert and oriented to person, place, and time.  Skin: Skin is warm and dry.  Psychiatric: She has a normal mood and affect.  Nursing note and vitals reviewed.   ED Course  Procedures (including critical care time) Labs Review Labs Reviewed - No data to display  Imaging Review Dg Chest 2 View  11/26/2015  CLINICAL DATA:  Cough, congestion, asthma EXAM: CHEST  2 VIEW COMPARISON:  06/09/2011 FINDINGS: Cardiomediastinal silhouette is stable. No acute infiltrate or pulmonary edema. Mild degenerative changes mid thoracic spine. Stable bilateral mild basilar scarring. IMPRESSION: No active cardiopulmonary disease. Stable mild basilar bilateral scarring. Electronically Signed   By: Lahoma Crocker M.D.   On: 11/26/2015 11:16   I have personally reviewed and evaluated these images and lab results as part of my medical decision-making.   EKG Interpretation None      MDM   Final diagnoses:  Asthma exacerbation  URI (upper respiratory infection)    Pt is feeling better at the treatment. No pneumonia noted. Will send home with albuterol solution for nebs and prednisone. Pt states that since her wt loss she doesn't take diabetes medication any more. Discussed follow up and return precautions with pt   Glendell Docker, NP 11/26/15 [place], MD 11/26/15 1218

## 2015-11-26 NOTE — ED Notes (Signed)
NP at the bedside

## 2016-01-09 ENCOUNTER — Ambulatory Visit (AMBULATORY_SURGERY_CENTER): Payer: Self-pay | Admitting: *Deleted

## 2016-01-09 VITALS — Ht 67.0 in | Wt 214.0 lb

## 2016-01-09 DIAGNOSIS — Z86010 Personal history of colonic polyps: Secondary | ICD-10-CM

## 2016-01-09 MED ORDER — NA SULFATE-K SULFATE-MG SULF 17.5-3.13-1.6 GM/177ML PO SOLN: ORAL | Status: DC

## 2016-01-09 NOTE — Progress Notes (Signed)
Patient denies any allergies to eggs or soy. Patient denies any problems with anesthesia/sedation. Patient denies any oxygen use at home and does not take any diet/weight loss medications.  

## 2016-01-14 ENCOUNTER — Encounter (HOSPITAL_COMMUNITY): Payer: Self-pay | Admitting: *Deleted

## 2016-01-14 ENCOUNTER — Emergency Department (INDEPENDENT_AMBULATORY_CARE_PROVIDER_SITE_OTHER): Payer: Medicare Other

## 2016-01-14 ENCOUNTER — Emergency Department (INDEPENDENT_AMBULATORY_CARE_PROVIDER_SITE_OTHER)
Admission: EM | Admit: 2016-01-14 | Discharge: 2016-01-14 | Disposition: A | Discharge status: Home / Self Care | Payer: Medicare Other | Source: Home / Self Care | Attending: Emergency Medicine | Admitting: Emergency Medicine

## 2016-01-14 DIAGNOSIS — J40 Bronchitis, not specified as acute or chronic: Secondary | ICD-10-CM

## 2016-01-14 DIAGNOSIS — R05 Cough: Secondary | ICD-10-CM | POA: Diagnosis not present

## 2016-01-14 DIAGNOSIS — R509 Fever, unspecified: Secondary | ICD-10-CM | POA: Diagnosis not present

## 2016-01-14 MED ORDER — METHYLPREDNISOLONE SODIUM SUCC 125 MG IJ SOLR
INTRAMUSCULAR | Status: AC
  Filled 2016-01-14: qty 2

## 2016-01-14 MED ORDER — METHYLPREDNISOLONE SODIUM SUCC 125 MG IJ SOLR
125.0000 mg | Freq: Once | INTRAMUSCULAR | Status: AC
  Administered 2016-01-14: 125 mg via INTRAMUSCULAR

## 2016-01-14 MED ORDER — AZITHROMYCIN 250 MG PO TABS: ORAL_TABLET | ORAL | Status: DC

## 2016-01-14 MED ORDER — IPRATROPIUM-ALBUTEROL 0.5-2.5 (3) MG/3ML IN SOLN
3.0000 mL | Freq: Once | RESPIRATORY_TRACT | Status: AC
  Administered 2016-01-14: 3 mL via RESPIRATORY_TRACT

## 2016-01-14 MED ORDER — IPRATROPIUM-ALBUTEROL 0.5-2.5 (3) MG/3ML IN SOLN
RESPIRATORY_TRACT | Status: AC
  Filled 2016-01-14: qty 3

## 2016-01-14 MED ORDER — HYDROCODONE-HOMATROPINE 5-1.5 MG/5ML PO SYRP: 5.0000 mL | ORAL_SOLUTION | Freq: Four times a day (QID) | ORAL | Status: DC | PRN

## 2016-01-14 MED ORDER — PREDNISONE 50 MG PO TABS: ORAL_TABLET | ORAL | Status: DC

## 2016-01-14 NOTE — Discharge Instructions (Signed)
You have bronchitis. °Take azithromycin and prednisone as prescribed. °Use hycodan as needed for cough. °Use the albuterol every 4 hours as needed for wheezing or cough. °You should see improvement in the next 3-5 days. °If you develop fevers, difficulty breathing, or are just not getting better, please come back or go to the emergency room. ° °

## 2016-01-14 NOTE — ED Provider Notes (Signed)
CSN: [id number]     Arrival date & time 01/14/16  1300 History   First MD Initiated Contact with Patient 01/14/16 1308     Chief Complaint  Patient presents with  . Cough   (Consider location/radiation/quality/duration/timing/severity/associated sxs/prior Treatment) HPI  He is a 60 year old woman here for evaluation of cough. She states her symptoms started 3 days ago with cough and wheezing. She does report a fever of 100.6 at home on Saturday. No fevers since then. She reports feeling short of breath. She also has pain along her ribs and back, worse with coughing. She is unable to sleep at night due to the cough. She reports a decreased appetite, but denies nausea or vomiting. She is tolerating liquids well. No nasal congestion or rhinorrhea. She has been using her pro-air and nebulizer machine with temporary improvement. 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  Past Medical History  Diagnosis Date  . Asthma   . Obesity   . Smoker   . Colonic polyp   . Pain in limb   . Swelling of limb   . Arthritis    Past Surgical History  Procedure Laterality Date  . Colonoscopy  2009    Dr. Henrene Pastor   Family History  Problem Relation Age of Onset  . Colon cancer Neg Hx    Social History  Substance Use Topics  . Smoking status: Current Every Day Smoker -- 0.25 packs/day for 10 years    Types: Cigarettes  . Smokeless tobacco: Never Used  . Alcohol Use: No   OB History    No data available     Review of Systems As in history of present illness Allergies  Review of patient's allergies indicates no known allergies.  Home Medications   Prior to Admission medications   Medication Sig Start Date End Date Taking? Authorizing Provider  ACCU-CHEK AVIVA PLUS test strip USE STRIP TO CHECK GLUCOSE DAILY 08/15/15   [name], MD  albuterol (PROAIR HFA) 108 (90 BASE) MCG/ACT inhaler Inhale 2 puffs into the lungs 2 (two) times daily. 05/07/15   [name], MD  albuterol (PROVENTIL)  (2.5 MG/3ML) 0.083% nebulizer solution Take 3 mLs (2.5 mg total) by nebulization every 4 (four) hours as needed for wheezing or shortness of breath. 11/26/15   Glendell Docker, NP  Aspirin-Salicylamide-Caffeine (BC HEADACHE POWDER PO) Take 1 packet by mouth as needed. Reported on 01/09/2016    Historical Provider, MD  azithromycin (ZITHROMAX Z-PAK) 250 MG tablet Take 2 pills today, then 1 pill daily until gone. 01/14/16   Melony Overly, MD  Blood Glucose Monitoring Suppl (ACCU-CHEK AVIVA PLUS) W/DEVICE KIT USE AS DIRECTED TO CHECK BLOOD GLUCOSE 1 TO 2 TIMES DAILY 08/10/15   [name], MD  fluticasone (FLOVENT DISKUS) 50 MCG/BLIST diskus inhaler Inhale 1 puff into the lungs 2 (two) times daily. 03/01/15   [name], MD  GARCINIA CAMBOGIA-CHROMIUM PO Take 1 tablet by mouth 2 (two) times daily.    Historical Provider, MD  HYDROcodone-homatropine (HYCODAN) 5-1.5 MG/5ML syrup Take 5 mLs by mouth every 6 (six) hours as needed for cough. 01/14/16   Melony Overly, MD  ibuprofen (ADVIL,MOTRIN) 800 MG tablet TAKE ONE TABLET BY MOUTH EVERY 8 HOURS AS NEEDED 11/05/15   [name], MD  lisinopril-hydrochlorothiazide (PRINZIDE,ZESTORETIC) 20-12.5 MG tablet Take 1 tablet by mouth daily. 10/17/15   [name], MD  Na Sulfate-K Sulfate-Mg Sulf SOLN Suprep (no substitutions)-TAKE AS DIRECTED. 01/09/16   Jenny Reichmann  Delice Lesch, MD  predniSONE (DELTASONE) 50 MG tablet Take 1 pill daily for 5 days. 01/14/16   Melony Overly, MD  PROAIR HFA 108 (90 BASE) MCG/ACT inhaler INHALE TWO PUFFS BY MOUTH TWICE DAILY 11/21/15   [name], MD   Meds Ordered and Administered this Visit   Medications  ipratropium-albuterol (DUONEB) 0.5-2.5 (3) MG/3ML nebulizer solution 3 mL (3 mLs Nebulization Given 01/14/16 1341)  methylPREDNISolone sodium succinate (SOLU-MEDROL) 125 mg/2 mL injection 125 mg (125 mg Intramuscular Given 01/14/16 1341)    BP 126/67 mmHg  Pulse 95  Temp(Src) 99.7 F (37.6 C) (Oral)  Resp 16  SpO2 94% No data  found.   Physical Exam  Constitutional: She is oriented to person, place, and time. She appears well-nourished. No distress.  Cardiovascular: Normal rate, regular rhythm and normal heart sounds.   No murmur heard. Pulmonary/Chest: Effort normal. No respiratory distress. She has wheezes (Diffuse expiratory wheezes, worse in the right lung fields). She has no rales. She exhibits tenderness (along the rib cage).  Neurological: She is alert and oriented to person, place, and time.    ED Course  Procedures (including critical care time)  Labs Review Labs Reviewed - No data to display  Imaging Review Dg Chest 2 View  01/14/2016  CLINICAL DATA:  Cough, fever and chest pain for 4 days. Initial encounter. EXAM: CHEST  2 VIEW COMPARISON:  PA and lateral chest 11/26/2015 and 06/09/2011. FINDINGS: The chest is hyperexpanded with attenuation of the pulmonary vasculature. No consolidative process, pneumothorax or effusion is identified. Heart size is normal. No focal bony abnormality. IMPRESSION: Findings compatible with COPD.  No acute disease. Electronically Signed   By: Inge Rise M.D.   On: 01/14/2016 13:35     MDM   1. Bronchitis    Lung fields clear after DuoNeb.  Treat with prednisone and azithromycin. Hycodan as needed for cough. Recommended albuterol use every 4 hours. Follow-up as needed.    Melony Overly, MD 01/14/16 [phone]

## 2016-01-14 NOTE — ED Notes (Signed)
Pt  Reports  Cough      /  Congested      With   Symptoms  Of pains  In   Her  Chest       A  History  Of  Asthma          Pt     Reports  Some  Shortness     Of  Breath      She  Reports  Symptoms   X  6  Days

## 2016-01-15 ENCOUNTER — Telehealth: Payer: Self-pay | Admitting: Family Medicine

## 2016-01-15 NOTE — Telephone Encounter (Signed)
ER letter sent 

## 2016-01-23 ENCOUNTER — Ambulatory Visit (AMBULATORY_SURGERY_CENTER): Payer: Medicare Other | Admitting: Internal Medicine

## 2016-01-23 ENCOUNTER — Encounter: Payer: Self-pay | Admitting: Internal Medicine

## 2016-01-23 VITALS — BP 147/70 | HR 66 | Temp 98.9°F | Resp 11 | Ht 67.0 in | Wt 214.0 lb

## 2016-01-23 DIAGNOSIS — D124 Benign neoplasm of descending colon: Secondary | ICD-10-CM

## 2016-01-23 DIAGNOSIS — Z86010 Personal history of colonic polyps: Secondary | ICD-10-CM | POA: Diagnosis present

## 2016-01-23 DIAGNOSIS — D125 Benign neoplasm of sigmoid colon: Secondary | ICD-10-CM

## 2016-01-23 MED ORDER — SODIUM CHLORIDE 0.9 % IV SOLN: 500.0000 mL | INTRAVENOUS | Status: DC

## 2016-01-23 NOTE — Patient Instructions (Signed)
YOU HAD AN ENDOSCOPIC PROCEDURE TODAY AT [facility]:   Refer to the procedure report that was given to you for any specific questions about what was found during the examination.  If the procedure report does not answer your questions, please call your gastroenterologist to clarify.  If you requested that your care partner not be given the details of your procedure findings, then the procedure report has been included in a sealed envelope for you to review at your convenience later.  YOU SHOULD EXPECT: Some feelings of bloating in the abdomen. Passage of more gas than usual.  Walking can help get rid of the air that was put into your GI tract during the procedure and reduce the bloating. If you had a lower endoscopy (such as a colonoscopy or flexible sigmoidoscopy) you may notice spotting of blood in your stool or on the toilet paper. If you underwent a bowel prep for your procedure, you may not have a normal bowel movement for a few days.  Please Note:  You might notice some irritation and congestion in your nose or some drainage.  This is from the oxygen used during your procedure.  There is no need for concern and it should clear up in a day or so.  SYMPTOMS TO REPORT IMMEDIATELY:   Following lower endoscopy (colonoscopy or flexible sigmoidoscopy):  Excessive amounts of blood in the stool  Significant tenderness or worsening of abdominal pains  Swelling of the abdomen that is new, acute  Fever of 100F or higher   For urgent or emergent issues, a gastroenterologist can be reached at any hour by calling [phone].   DIET: Your first meal following the procedure should be a small meal and then it is ok to progress to your normal diet. Heavy or fried foods are harder to digest and may make you feel nauseous or bloated.  Likewise, meals heavy in dairy and vegetables can increase bloating.  Drink plenty of fluids but you should avoid alcoholic beverages for 24  hours.  ACTIVITY:  You should plan to take it easy for the rest of today and you should NOT DRIVE or use heavy machinery until tomorrow (because of the sedation medicines used during the test).    FOLLOW UP: Our staff will call the number listed on your records the next business day following your procedure to check on you and address any questions or concerns that you may have regarding the information given to you following your procedure. If we do not reach you, we will leave a message.  However, if you are feeling well and you are not experiencing any problems, there is no need to return our call.  We will assume that you have returned to your regular daily activities without incident.  If any biopsies were taken you will be contacted by phone or by letter within the next 1-3 weeks.  Please call us at [phone] if you have not heard about the biopsies in 3 weeks.    SIGNATURES/CONFIDENTIALITY: You and/or your care partner have signed paperwork which will be entered into your electronic medical record.  These signatures attest to the fact that that the information above on your After Visit Summary has been reviewed and is understood.  Full responsibility of the confidentiality of this discharge information lies with you and/or your care-partner.  Resume medications. Information given on  Polyps.

## 2016-01-23 NOTE — Op Note (Signed)
[facility]  [address] [place], [address]   COLONOSCOPY PROCEDURE REPORT  PATIENT: [name], [name]  MR#: [id number] BIRTHDATE: 07-03-1956 , 32  yrs. old GENDER: female ENDOSCOPIST: Eustace Quail, MD REFERRED BY:.Direct Self PROCEDURE DATE:  01/23/2016 PROCEDURE:   Colonoscopy, surveillance and Colonoscopy with snare polypectomy x 4 First Screening Colonoscopy - Avg.  risk and is 50 yrs.  old or older - No.  Prior Negative Screening - Now for repeat screening. N/A  History of Adenoma - Now for follow-up colonoscopy & has been > or = to 3 yrs.  Yes hx of adenoma.  Has been 3 or more years since last colonoscopy.  Polyps removed today? Yes ASA CLASS:   Class II INDICATIONS:Surveillance due to prior colonic neoplasia and PH Colon Adenoma.  . Index examination performed May 2009. 7 polyps between 2 and 20 mm removed. Multiple adenomatous. Well overdue for follow-up. MEDICATIONS: Monitored anesthesia care and Propofol 250 mg IV  DESCRIPTION OF PROCEDURE:   After the risks benefits and alternatives of the procedure were thoroughly explained, informed consent was obtained.  The digital rectal exam revealed no abnormalities of the rectum.   The LB [id number]  endoscope was introduced through the anus and advanced to the mid transverse colon. No adverse events experienced.   The quality of the prep was poor.  (Suprep was used)  The instrument was then slowly withdrawn as the colon was fully examined. Estimated blood loss is zero unless otherwise noted in this procedure report.  COLON FINDINGS: The colonic preparation was poor with dense turbid liquid stool throughout obstructing the view.  The exam was not carried out beyond the mid transverse colon.  3 descending and one sigmoid colon polyp measuring between 5 and 8 mm were removed. Cold snare.  Retrieved and submitted.  The rectum was viewed from the anal os but no retroflexion performed due to  inability to hold air and poor visibility.  Retroflexion was not performed. The time to cecum =    Withdrawal time =      The scope was withdrawn and the procedure completed. COMPLICATIONS: There were no immediate complications.  ENDOSCOPIC IMPRESSION: 1. Several left sided colon polyps removed 2. Poor prep resulting in inadequate and incomplete exam  RECOMMENDATIONS: 1. Repeat Colonoscopy ASAP. Patient will need 2 days of clear liquids, one bottle of magnesium citrate in the morning the day before the procedure. Then, standard Suprep  eSigned:  Eustace Quail, MD 01/23/2016 5:14 PM   cc: The Patient and Jill Alexanders, MD

## 2016-01-23 NOTE — Progress Notes (Signed)
TO PACU  Pt awake and alert. Report to RN 

## 2016-01-23 NOTE — Progress Notes (Signed)
Called to room to assist during endoscopic procedure.  Patient ID and intended procedure confirmed with present staff. Received instructions for my participation in the procedure from the performing physician.  

## 2016-01-23 NOTE — Progress Notes (Signed)
Patient is a poor historian regarding her family history and medications.

## 2016-01-24 ENCOUNTER — Telehealth: Payer: Self-pay | Admitting: *Deleted

## 2016-01-24 NOTE — Telephone Encounter (Signed)
  Follow up Call-  Call back number 01/23/2016  Post procedure Call Back phone  # [phone]  Permission to leave phone message Yes     Patient questions:  Do you have a fever, pain , or abdominal swelling? No. Pain Score  0 *  Have you tolerated food without any problems? Yes.    Have you been able to return to your normal activities? Yes.    Do you have any questions about your discharge instructions: Diet   No. Medications  No. Follow up visit  No.  Do you have questions or concerns about your Care? No.  Actions: * If pain score is 4 or above: No action needed, pain <4.

## 2016-01-28 ENCOUNTER — Encounter: Payer: Self-pay | Admitting: Internal Medicine

## 2016-02-01 ENCOUNTER — Encounter: Payer: Self-pay | Admitting: Internal Medicine

## 2016-02-05 ENCOUNTER — Encounter: Payer: Medicare Other | Admitting: Internal Medicine

## 2016-03-03 ENCOUNTER — Telehealth: Payer: Self-pay | Admitting: Internal Medicine

## 2016-03-03 NOTE — Telephone Encounter (Signed)
Says she will call back to reschedule in the summertime maybe.

## 2016-03-03 NOTE — Telephone Encounter (Signed)
Pt states she does not have anyone to drive her to procedure. Cancelled colon for now, will call back in a few months to reschedule. Dr. Henrene Pastor aware.

## 2016-03-10 ENCOUNTER — Encounter: Payer: Medicare Other | Admitting: Internal Medicine

## 2016-03-11 ENCOUNTER — Other Ambulatory Visit: Payer: Self-pay | Admitting: Family Medicine

## 2016-03-11 NOTE — Telephone Encounter (Signed)
Is this okay?

## 2016-03-13 ENCOUNTER — Other Ambulatory Visit: Payer: Self-pay | Admitting: Family Medicine

## 2016-03-19 ENCOUNTER — Telehealth: Payer: Self-pay | Admitting: Family Medicine

## 2016-03-19 NOTE — Telephone Encounter (Signed)
Pt called and was wanting a refill on her albuterol pt uses [facility] [place], [place] - [address]

## 2016-03-20 ENCOUNTER — Other Ambulatory Visit: Payer: Self-pay

## 2016-03-20 NOTE — Telephone Encounter (Signed)
This was sent in on the 17th

## 2016-03-25 ENCOUNTER — Other Ambulatory Visit: Payer: Self-pay

## 2016-03-25 ENCOUNTER — Ambulatory Visit (INDEPENDENT_AMBULATORY_CARE_PROVIDER_SITE_OTHER): Payer: Medicare Other | Admitting: Family Medicine

## 2016-03-25 VITALS — BP 128/70 | HR 66 | Wt 218.0 lb

## 2016-03-25 DIAGNOSIS — J4530 Mild persistent asthma, uncomplicated: Secondary | ICD-10-CM | POA: Diagnosis not present

## 2016-03-25 DIAGNOSIS — Z720 Tobacco use: Secondary | ICD-10-CM

## 2016-03-25 DIAGNOSIS — F17200 Nicotine dependence, unspecified, uncomplicated: Secondary | ICD-10-CM

## 2016-03-25 DIAGNOSIS — Z716 Tobacco abuse counseling: Secondary | ICD-10-CM

## 2016-03-25 MED ORDER — ALBUTEROL SULFATE (2.5 MG/3ML) 0.083% IN NEBU: 2.5000 mg | INHALATION_SOLUTION | RESPIRATORY_TRACT | Status: DC | PRN

## 2016-03-25 MED ORDER — IBUPROFEN 800 MG PO TABS: 800.0000 mg | ORAL_TABLET | Freq: Three times a day (TID) | ORAL | Status: DC | PRN

## 2016-03-25 MED ORDER — VARENICLINE TARTRATE 0.5 MG X 11 & 1 MG X 42 PO MISC: ORAL | Status: DC

## 2016-03-25 NOTE — Telephone Encounter (Signed)
Had to resend neb soul. Because it printed

## 2016-03-25 NOTE — Progress Notes (Signed)
   Subjective:    Patient ID: [name], female    DOB: 08-09-1956, 60 y.o.   MRN: [id number]  HPI She is here for consult concerning smoking cessation. She did try Chantix and did apparently finished 2 months of this. She then became sick and started smoking again. She did not get involved in counseling with this. She would like to try again but when questioned on how she is going to handle the next time something interferes with her program, she could not give me a good answer. She also would like a refill on her Proventil nebulizer. Apparently she does use a nebulizer several times per week   Review of Systems     Objective:   Physical Exam Alert and in no distress otherwise not examined alert and in no distress otherwise not examinedaAhert and in no       Assessment & Plan:  Current smoker on some days - Plan: varenicline (CHANTIX STARTING MONTH PAK) 0.5 MG X 11 & 1 MG X 42 tablet, ibuprofen (ADVIL,MOTRIN) 800 MG tablet  Encounter for smoking cessation counseling - Plan: varenicline (CHANTIX STARTING MONTH PAK) 0.5 MG X 11 & 1 MG X 42 tablet  Asthma, chronic, mild persistent, uncomplicated - Plan: DISCONTINUED: albuterol (PROVENTIL) (2.5 MG/3ML) 0.083% nebulizer solution I will place her back on Chantix. She is to return here in one month. Strongly encouraged her to call the 800 number to help with the psychological component to this. Again stressed the need for her to have a game plan for when she gets stressed and her normal daily routines are disrupted. Also discussed treatment of asthma with her. If she is unable to quit smoking we will probably need to consider placing her on an inhaled steroid.Over 25 minutes, greater than 50% spent in counseling and coordination of care.

## 2016-03-28 ENCOUNTER — Other Ambulatory Visit: Payer: Self-pay | Admitting: Family Medicine

## 2016-03-28 NOTE — Telephone Encounter (Signed)
Is this okay?

## 2016-04-23 ENCOUNTER — Ambulatory Visit: Payer: Medicare Other | Admitting: Family Medicine

## 2016-05-21 ENCOUNTER — Other Ambulatory Visit: Payer: Self-pay | Admitting: Family Medicine

## 2016-05-22 MED ORDER — VARENICLINE TARTRATE 1 MG PO TABS: 1.0000 mg | ORAL_TABLET | Freq: Two times a day (BID) | ORAL | Status: DC

## 2016-05-22 NOTE — Telephone Encounter (Signed)
Is this okay?

## 2016-05-31 ENCOUNTER — Telehealth: Payer: Self-pay | Admitting: Family Medicine

## 2016-06-02 NOTE — Telephone Encounter (Signed)
Recv'd  Response back that P.A. Not required, called pharmacy & went thru for $3.70

## 2016-07-23 ENCOUNTER — Ambulatory Visit (INDEPENDENT_AMBULATORY_CARE_PROVIDER_SITE_OTHER): Payer: Medicare Other | Admitting: Podiatry

## 2016-07-23 ENCOUNTER — Encounter: Payer: Self-pay | Admitting: Podiatry

## 2016-07-23 VITALS — BP 144/64 | HR 65 | Resp 19

## 2016-07-23 DIAGNOSIS — L84 Corns and callosities: Secondary | ICD-10-CM

## 2016-07-23 DIAGNOSIS — Q828 Other specified congenital malformations of skin: Secondary | ICD-10-CM

## 2016-07-23 DIAGNOSIS — M216X9 Other acquired deformities of unspecified foot: Secondary | ICD-10-CM | POA: Diagnosis not present

## 2016-07-24 NOTE — Progress Notes (Signed)
Subjective:     Patient ID: [name], female   DOB: 07-28-1956, 60 y.o.   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 Patient will be seen back to recheck

## 2016-09-01 ENCOUNTER — Other Ambulatory Visit (INDEPENDENT_AMBULATORY_CARE_PROVIDER_SITE_OTHER): Payer: Medicare Other

## 2016-09-01 ENCOUNTER — Telehealth: Payer: Self-pay | Admitting: Family Medicine

## 2016-09-01 DIAGNOSIS — Z111 Encounter for screening for respiratory tuberculosis: Secondary | ICD-10-CM

## 2016-09-01 NOTE — Telephone Encounter (Signed)
Pt dropped off form to be filled (foster grandparent program)out pt would like to pick up wed when she comes back to get her tb test read, pt can be reached at [phone] with any questions, put in your folder

## 2016-09-03 ENCOUNTER — Ambulatory Visit (INDEPENDENT_AMBULATORY_CARE_PROVIDER_SITE_OTHER): Payer: Medicare Other | Admitting: Family Medicine

## 2016-09-03 ENCOUNTER — Encounter: Payer: Self-pay | Admitting: Family Medicine

## 2016-09-03 VITALS — BP 118/70 | HR 74 | Ht 65.5 in | Wt 220.0 lb

## 2016-09-03 DIAGNOSIS — E118 Type 2 diabetes mellitus with unspecified complications: Secondary | ICD-10-CM | POA: Diagnosis not present

## 2016-09-03 DIAGNOSIS — F17200 Nicotine dependence, unspecified, uncomplicated: Secondary | ICD-10-CM

## 2016-09-03 DIAGNOSIS — I10 Essential (primary) hypertension: Secondary | ICD-10-CM | POA: Diagnosis not present

## 2016-09-03 DIAGNOSIS — E1159 Type 2 diabetes mellitus with other circulatory complications: Secondary | ICD-10-CM | POA: Diagnosis not present

## 2016-09-03 DIAGNOSIS — I152 Hypertension secondary to endocrine disorders: Secondary | ICD-10-CM

## 2016-09-03 DIAGNOSIS — E669 Obesity, unspecified: Secondary | ICD-10-CM

## 2016-09-03 LAB — POCT GLYCOSYLATED HEMOGLOBIN (HGB A1C): HEMOGLOBIN A1C: 6.8

## 2016-09-03 LAB — TB SKIN TEST: TB Skin Test: NEGATIVE

## 2016-09-03 NOTE — Progress Notes (Signed)
   Subjective:    Patient ID: Marissa Nestle, female    DOB: [birth year], 60 y.o.   MRN: [id number]  HPI She is here for consult concerning having a form filled out for foster grandparenting. She has not been here for further evaluation of her underlying diabetes. Ex her blood sugars usually once per week. She is [facility] She also has been using Chantix but using it on an irregular basis. She still continues to smoke.   Review of Systems     Objective:   Physical Exam Alert and in no distress. Cardiac exam shows regular rhythm without murmurs or gallops. Lungs are clear to auscultation. A1c 6.8      Assessment & Plan:  Hypertension associated with diabetes ([place])  Type 2 diabetes mellitus with complication, without long-term current use of insulin ([place]) - Plan: HgB A1c  Current smoker on some days  Obesity (BMI 30-39.9) The paperwork was filled out. I encouraged her to do a better job of diet and exercise as her A1c is going in the wrong direction. Also recommended she return here for further discussion of her smoking as she has not quit smoking and is intermittently using the Chantix. Over 20 minutes, greater than 50% spent in counseling and coordination of care.

## 2016-09-24 ENCOUNTER — Telehealth: Payer: Self-pay

## 2016-09-24 ENCOUNTER — Ambulatory Visit (INDEPENDENT_AMBULATORY_CARE_PROVIDER_SITE_OTHER): Payer: Medicare Other | Admitting: Podiatry

## 2016-09-24 ENCOUNTER — Encounter: Payer: Self-pay | Admitting: Podiatry

## 2016-09-24 ENCOUNTER — Ambulatory Visit (INDEPENDENT_AMBULATORY_CARE_PROVIDER_SITE_OTHER): Payer: Medicare Other

## 2016-09-24 DIAGNOSIS — M216X9 Other acquired deformities of unspecified foot: Secondary | ICD-10-CM | POA: Diagnosis not present

## 2016-09-24 DIAGNOSIS — L84 Corns and callosities: Secondary | ICD-10-CM

## 2016-09-24 DIAGNOSIS — R52 Pain, unspecified: Secondary | ICD-10-CM

## 2016-09-24 NOTE — Telephone Encounter (Signed)
Pt called and said she is not using [facility] for her Diabetes Supplies

## 2016-09-24 NOTE — Telephone Encounter (Signed)
Left message for pt to call us back to let us know if she is wanting to use [facility]  For her Diabetes testing supplies

## 2016-09-25 NOTE — Progress Notes (Signed)
Subjective:     Patient ID: [name], female   DOB: [birth year], 60 y.o.   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 Patient will be seen back to recheck  X-ray reviewed indicate there is no indication of stress fracture and mild to moderate arthritis is noted

## 2016-11-06 ENCOUNTER — Other Ambulatory Visit: Payer: Self-pay | Admitting: Family Medicine

## 2016-12-04 ENCOUNTER — Other Ambulatory Visit: Payer: Self-pay | Admitting: Family Medicine

## 2016-12-04 DIAGNOSIS — F17200 Nicotine dependence, unspecified, uncomplicated: Secondary | ICD-10-CM

## 2016-12-30 ENCOUNTER — Ambulatory Visit (INDEPENDENT_AMBULATORY_CARE_PROVIDER_SITE_OTHER): Payer: Medicare Other | Admitting: Family Medicine

## 2016-12-30 ENCOUNTER — Encounter: Payer: Self-pay | Admitting: Family Medicine

## 2016-12-30 VITALS — BP 140/70 | HR 82 | Temp 99.7°F | Wt 222.8 lb

## 2016-12-30 DIAGNOSIS — J4530 Mild persistent asthma, uncomplicated: Secondary | ICD-10-CM

## 2016-12-30 DIAGNOSIS — J069 Acute upper respiratory infection, unspecified: Secondary | ICD-10-CM

## 2016-12-30 MED ORDER — AZITHROMYCIN 500 MG PO TABS: 500.0000 mg | ORAL_TABLET | Freq: Every day | ORAL | 0 refills | Status: DC

## 2016-12-30 MED ORDER — FLUTICASONE PROPIONATE (INHAL) 50 MCG/BLIST IN AEPB: 1.0000 | INHALATION_SPRAY | Freq: Two times a day (BID) | RESPIRATORY_TRACT | 12 refills | Status: DC

## 2016-12-30 NOTE — Patient Instructions (Signed)
Use Flovent 1+ twice per day every day and you can use the pro-air up to 4 times per day as needed.

## 2016-12-30 NOTE — Progress Notes (Signed)
   Subjective:    Patient ID: [name], female    DOB: 1956/02/02, 61 y.o.   MRN: [id number]  HPI She complains of an 8 day history this started with increasing difficulty with cough, shortness of breath, sneezing, rhinorrhea, nasal congestion, sore throat. No fever, chills or earache. Review of her medications indicates she was not taking the Flovent on a regular basis. She was using albuterol fairly consistently. She also did use Chantix in the past but did not follow-up as dictated in the record.   Review of Systems     Objective:   Physical Exam Alert and in no distress. Tympanic membranes and canals are normal. Pharyngeal area is normal. Neck is supple without adenopathy or thyromegaly. Cardiac exam shows a regular sinus rhythm without murmurs or gallops. Lungs show expiratory wheezing.        Assessment & Plan:  Asthma, chronic, mild persistent, uncomplicated - Plan: fluticasone (FLOVENT DISKUS) 50 MCG/BLIST diskus inhaler  Acute upper respiratory infection - Plan: azithromycin (ZITHROMAX) 500 MG tablet I went over proper use of the Flovent recommending twice per day regularly and using the albuterol on an as-needed basis. I will give her azithromycin. She will call in 1 week to let me know how she is doing. We then briefly discussed her smoking. I recommended she set up a separate appointment so we can discuss this in more detail.

## 2017-03-02 ENCOUNTER — Other Ambulatory Visit: Payer: Self-pay | Admitting: Family Medicine

## 2017-03-02 ENCOUNTER — Telehealth: Payer: Self-pay | Admitting: Family Medicine

## 2017-03-02 NOTE — Telephone Encounter (Signed)
Pt informed and verbalized understanding

## 2017-03-02 NOTE — Telephone Encounter (Signed)
nope

## 2017-03-02 NOTE — Telephone Encounter (Signed)
Pt called and wants to know if  You will send her a refill on her chantix she is wanting to stop smoking states she has taking it before but not followed through, she wants to start today so she can get a head start on in before she comes in I made her a CPE for Friday 03/06/2017. I told her you would want to see her before you prescribe it to her but she wanted me to send you a message anyway pt uses [place], [place] - [address]

## 2017-03-06 ENCOUNTER — Encounter: Payer: Self-pay | Admitting: Family Medicine

## 2017-03-06 ENCOUNTER — Ambulatory Visit (INDEPENDENT_AMBULATORY_CARE_PROVIDER_SITE_OTHER): Payer: Medicare Other | Admitting: Family Medicine

## 2017-03-06 VITALS — BP 116/70 | HR 68 | Ht 66.0 in | Wt 225.0 lb

## 2017-03-06 DIAGNOSIS — I10 Essential (primary) hypertension: Secondary | ICD-10-CM

## 2017-03-06 DIAGNOSIS — Z0000 Encounter for general adult medical examination without abnormal findings: Secondary | ICD-10-CM

## 2017-03-06 DIAGNOSIS — Z23 Encounter for immunization: Secondary | ICD-10-CM

## 2017-03-06 DIAGNOSIS — E669 Obesity, unspecified: Secondary | ICD-10-CM | POA: Diagnosis not present

## 2017-03-06 DIAGNOSIS — E118 Type 2 diabetes mellitus with unspecified complications: Secondary | ICD-10-CM | POA: Diagnosis not present

## 2017-03-06 DIAGNOSIS — Z860100 Personal history of colon polyps, unspecified: Secondary | ICD-10-CM | POA: Insufficient documentation

## 2017-03-06 DIAGNOSIS — G8929 Other chronic pain: Secondary | ICD-10-CM | POA: Diagnosis not present

## 2017-03-06 DIAGNOSIS — J301 Allergic rhinitis due to pollen: Secondary | ICD-10-CM | POA: Diagnosis not present

## 2017-03-06 DIAGNOSIS — E1159 Type 2 diabetes mellitus with other circulatory complications: Secondary | ICD-10-CM

## 2017-03-06 DIAGNOSIS — K219 Gastro-esophageal reflux disease without esophagitis: Secondary | ICD-10-CM | POA: Diagnosis not present

## 2017-03-06 DIAGNOSIS — J4530 Mild persistent asthma, uncomplicated: Secondary | ICD-10-CM | POA: Diagnosis not present

## 2017-03-06 DIAGNOSIS — F17200 Nicotine dependence, unspecified, uncomplicated: Secondary | ICD-10-CM

## 2017-03-06 DIAGNOSIS — M545 Low back pain: Secondary | ICD-10-CM

## 2017-03-06 DIAGNOSIS — I152 Hypertension secondary to endocrine disorders: Secondary | ICD-10-CM

## 2017-03-06 LAB — CBC WITH DIFFERENTIAL/PLATELET
BASOS PCT: 1 %
Basophils Absolute: 81 cells/uL (ref 0–200)
EOS PCT: 2 %
Eosinophils Absolute: 162 cells/uL (ref 15–500)
HCT: 36.9 % (ref 35.0–45.0)
Hemoglobin: 12.2 g/dL (ref 11.7–15.5)
LYMPHS ABS: 2835 {cells}/uL (ref 850–3900)
LYMPHS PCT: 35 %
MCH: 29.9 pg (ref 27.0–33.0)
MCHC: 33.1 g/dL (ref 32.0–36.0)
MCV: 90.4 fL (ref 80.0–100.0)
MPV: 9.4 fL (ref 7.5–12.5)
Monocytes Absolute: 486 cells/uL (ref 200–950)
Monocytes Relative: 6 %
NEUTROS PCT: 56 %
Neutro Abs: 4536 cells/uL (ref 1500–7800)
Platelets: 377 10*3/uL (ref 140–400)
RBC: 4.08 MIL/uL (ref 3.80–5.10)
RDW: 13.6 % (ref 11.0–15.0)
WBC: 8.1 10*3/uL (ref 4.0–10.5)

## 2017-03-06 LAB — COMPREHENSIVE METABOLIC PANEL
ALBUMIN: 4.2 g/dL (ref 3.6–5.1)
ALT: 12 U/L (ref 6–29)
AST: 14 U/L (ref 10–35)
Alkaline Phosphatase: 49 U/L (ref 33–130)
BUN: 21 mg/dL (ref 7–25)
CHLORIDE: 103 mmol/L (ref 98–110)
CO2: 27 mmol/L (ref 20–31)
CREATININE: 1.11 mg/dL — AB (ref 0.50–0.99)
Calcium: 9.4 mg/dL (ref 8.6–10.4)
GLUCOSE: 101 mg/dL — AB (ref 65–99)
Potassium: 4.3 mmol/L (ref 3.5–5.3)
SODIUM: 138 mmol/L (ref 135–146)
Total Bilirubin: 0.4 mg/dL (ref 0.2–1.2)
Total Protein: 7.3 g/dL (ref 6.1–8.1)

## 2017-03-06 LAB — LIPID PANEL
Cholesterol: 170 mg/dL (ref <200)
HDL: 50 mg/dL — ABNORMAL LOW (ref >50)
LDL CALC: 99 mg/dL (ref <100)
TRIGLYCERIDES: 103 mg/dL (ref <150)
Total CHOL/HDL Ratio: 3.4 Ratio (ref <5.0)
VLDL: 21 mg/dL (ref <30)

## 2017-03-06 LAB — POCT GLYCOSYLATED HEMOGLOBIN (HGB A1C): Hemoglobin A1C: 6.8

## 2017-03-06 MED ORDER — FLUTICASONE PROPIONATE (INHAL) 50 MCG/BLIST IN AEPB: 1.0000 | INHALATION_SPRAY | Freq: Two times a day (BID) | RESPIRATORY_TRACT | 12 refills | Status: DC

## 2017-03-06 MED ORDER — LISINOPRIL-HYDROCHLOROTHIAZIDE 20-12.5 MG PO TABS: 1.0000 | ORAL_TABLET | Freq: Every day | ORAL | 3 refills | Status: DC

## 2017-03-06 MED ORDER — ALBUTEROL SULFATE (2.5 MG/3ML) 0.083% IN NEBU: 2.5000 mg | INHALATION_SOLUTION | RESPIRATORY_TRACT | 1 refills | Status: DC | PRN

## 2017-03-06 NOTE — Patient Instructions (Signed)
Give yourself something else to do instead of smoking. Chew gum you can suck on a straw,play with a pen

## 2017-03-06 NOTE — Progress Notes (Signed)
   Subjective:    Patient ID: [name], female    DOB: 10-16-1956, 61 y.o.   MRN: [id number]  HPI She is here for complete examination. She continues have difficulty with low back pain. Apparently she has not been involved in an exercise program or been to physical therapy. She takes ibuprofen usually twice per week with good results. She does have underlying asthma as well as allergies. She continues on Flovent but does tend to use more of the albuterol during the spring allergy season. She does have underlying diabetes or presently is not on any medications. She does see a podiatrist regularly. She does see an optometrist. She has had a mammogram as well as Pap smear. Her colonoscopy was in 2017. She does have a history of colonic polyps. Last EKG 2012. She continues on her lisinopril without difficulty. She does continue smoke usually 6 or 7 cigarettes per day. She did try Chantix in the past and will like to try this again. She has underlying reflux however presently is not on any medications.   Review of Systems  All other systems reviewed and are negative.      Objective:   Physical Exam Alert and in no distress. Tympanic membranes and canals are normal. Pharyngeal area is normal. Neck is supple without adenopathy or thyromegaly. Cardiac exam shows a regular sinus rhythm without murmurs or gallops. Lungs are clear to auscultation.I exam deferred. Abdominal exam shows no masses or tenderness with normal bowel sounds.  A1c is 6.8.       Assessment & Plan:  Routine general medical examination at a health care facility  Chronic low back pain, unspecified back pain laterality, with sciatica presence unspecified  Current smoker on some days  Hypertension associated with diabetes ([place])  Type 2 diabetes mellitus with complication, without long-term current use of insulin ([facility])  Obesity (BMI 30-39.9)  Gastroesophageal reflux disease without esophagitis  Asthma, chronic,  mild persistent, uncomplicated - Plan: albuterol (PROVENTIL) (2.5 MG/3ML) 0.083% nebulizer solution, fluticasone (FLOVENT DISKUS) 50 MCG/BLIST diskus inhaler  History of colonic polyps I will refer her to physical therapy to get her on a good back rehabilitation program. Discussed in detail smoking with her explaining that on this low amount of cigarettes, she is truly not addicted to this. She did not seem to understand nor recognize the lack of benefit of Chantix. I then gave her instructions on things to do instead of smoking and discussed the fact that it's triggers it makes her want to smoke. She will continue on her blood pressure medication. Reflexes given her very little difficulty. She will continue on her asthma medications. Again encouraged her to get involved in a diet and exercise program. Hopefully the physical therapy will help with this.

## 2017-03-11 ENCOUNTER — Ambulatory Visit: Payer: Medicare Other | Admitting: Physical Therapy

## 2017-03-23 ENCOUNTER — Ambulatory Visit: Payer: Medicare Other | Admitting: Physical Therapy

## 2017-03-31 ENCOUNTER — Ambulatory Visit: Payer: Medicare Other

## 2017-04-13 ENCOUNTER — Ambulatory Visit: Payer: Medicare Other | Attending: Family Medicine | Admitting: Physical Therapy

## 2017-04-13 ENCOUNTER — Encounter: Payer: Self-pay | Admitting: Physical Therapy

## 2017-04-13 DIAGNOSIS — R252 Cramp and spasm: Secondary | ICD-10-CM | POA: Diagnosis not present

## 2017-04-13 DIAGNOSIS — G8929 Other chronic pain: Secondary | ICD-10-CM

## 2017-04-13 DIAGNOSIS — M6281 Muscle weakness (generalized): Secondary | ICD-10-CM | POA: Diagnosis not present

## 2017-04-13 DIAGNOSIS — M25551 Pain in right hip: Secondary | ICD-10-CM | POA: Diagnosis not present

## 2017-04-13 DIAGNOSIS — M545 Low back pain: Secondary | ICD-10-CM | POA: Diagnosis not present

## 2017-04-13 NOTE — Therapy (Signed)
[place], [place], [address] Phone: [phone]   Fax:  [phone]  Physical Therapy Evaluation  Patient Details  Name: [name] MRN: [id number] Date of Birth: 12-16-1955 Referring Provider: [name], MD  Encounter Date: 04/13/2017      PT End of Session - 04/13/17 1728    Visit Number 1   Number of Visits 13   Date for PT Re-Evaluation 05/25/17   Authorization Type MCR: kx mod by 15th visit, progress note by 10th visit.    PT Start Time 1630   PT Stop Time 1718   PT Time Calculation (min) 48 min   Activity Tolerance Patient tolerated treatment well   Behavior During Therapy WFL for tasks assessed/performed      Past Medical History:  Diagnosis Date  . Arthritis   . Asthma   . Colonic polyp   . Obesity   . Pain in limb   . Smoker   . Swelling of limb     Past Surgical History:  Procedure Laterality Date  . COLONOSCOPY  2009   Dr. Henrene Pastor    There were no vitals filed for this visit.       Subjective Assessment - 04/13/17 1610    Subjective pt is a 61 y.o F with report of low back and R posterolateral hip pain that started 6 years with fluctuating pain and referral of pain to the hip. She denies any N/T. Since onset of the pain has flucutated with intermittent worsening.    Limitations Standing;Walking;Other (comment)   How long can you sit comfortably? 5-10 min   How long can you stand comfortably? 5-10 min   How long can you walk comfortably? 5-10 min   Diagnostic tests nothing recently    Currently in Pain? Yes   Pain Score 8   at worst 9-10/10   Pain Location Back   Pain Orientation Right   Pain Descriptors / Indicators Sore;Aching   Pain Type Chronic pain   Pain Radiating Towards to the lateral aspect of the thigh    Pain Onset More than a month ago   Pain Frequency Constant   Aggravating Factors  prolonged standing, walking/ bending, getting out of bed.     Pain Relieving Factors massage, biofreeze,             [facility] PT Assessment - 04/13/17 1610      Assessment   Medical Diagnosis Low back pain with sciatica   Referring Provider [name], MD   Onset Date/Surgical Date --  6 years   Hand Dominance Right   Next MD Visit make one PRN   Prior Therapy yes     Precautions   Precautions None     Restrictions   Weight Bearing Restrictions No     Balance Screen   Has the patient fallen in the past 6 months No   Has the patient had a decrease in activity level because of a fear of falling?  No   Is the patient reluctant to leave their home because of a fear of falling?  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   Leisure watch TV, exercise      Cognition   Overall Cognitive Status Within Functional Limits for tasks assessed     Observation/Other Assessments   Focus on Therapeutic Outcomes (FOTO)  57% limited  predicted 47% limited     Posture/Postural Control   Posture/Postural Control Postural limitations   Postural Limitations Rounded Shoulders;Forward head     ROM / Strength   AROM / PROM / Strength AROM;Strength     AROM   AROM Assessment Site Lumbar   Lumbar Flexion 70  tightness in the R hip   Lumbar Extension 20   Lumbar - Right Side Bend 10  ERP   Lumbar - Left Side Bend 10     Strength   Strength Assessment Site Hip;Knee   Right/Left Hip Right;Left   Right Hip Flexion 4-/5   Right Hip ABduction 3+/5   Right Hip ADduction 4/5   Left Hip Flexion 4-/5   Left Hip ABduction 3+/5    Left Hip ADduction 4/5   Right/Left Knee Left;Right   Right Knee Flexion 5/5   Right Knee Extension 5/5   Left Knee Flexion 5/5   Left Knee Extension 5/5     Palpation   Palpation comment TTP at R greater trochanter, tightness with soreness in the glute med / TFL with multiple trigger points.     Special Tests    Special Tests Lumbar;Sacrolliac Tests   Lumbar Tests Slump Test;Prone Knee Bend Test;Straight Leg Raise     Prone Knee Bend Test   Findings Positive   Side Right   Comment hip hiking                           PT Education - 04/13/17 1727    Education provided Yes   Education Details evaluation findings, POC, goals, HEP with proper form/ rationale, anatomy regrading bursaes    Person(s) Educated Patient   Methods Explanation;Verbal cues;Handout   Comprehension Verbalized understanding;Verbal cues required          PT Short Term Goals - 04/13/17 1734      PT SHORT TERM GOAL #1   Title to be I with inital HEP (03/04/2017)   Time 3   Period Weeks   Status New     PT SHORT TERM GOAL #2   Title to demo / verbalize proper posture and lifting/ carrying techniques to prevent and reduce low back/ hip pain (03/04/2017)   Time 3   Period Weeks   Status New           PT Long Term Goals - 04/13/17 1735      PT LONG TERM GOAL #1   Title pt will be I with all HEP given as of last visit ( 05/25/2017)   Time 6   Period Weeks   Status New     PT LONG TERM GOAL #2   Title increase bil hip strength to >/= 4/5 to promote stability and walking/ standing endurance (05/25/2017)   Time 6   Period Weeks   Status New     PT LONG TERM GOAL #3   Title be able to sit/ stand/ walk for >/= 30 min with </= 2/10 pain for functional endurance required for ADLs (05/25/2017)   Time 6   Period  Weeks   Status New     PT LONG TERM GOAL #4   Title improve FOTO score to </= 47% limited to demo improvement in function (05/25/2017)   Time 6   Period Weeks   Status  New               Plan - 04/13/17 1729    Clinical Impression Statement pt presents to OPP[facility] as a moderate complexity evaluation due to involved PMHx/ age, worsening/ fluctuating symptoms and exam findings regarding CC of low back/ R posterolateral hip pain. She demos function trunk flexion with limited extension/ side bending due to pain. TTP along the greater trochanter on the R and at the PSIS on the R. weakness noted in bil hip. she would benefit from physical therapy to decrease R hip/ low back pain, increase strength and maximize her function by addressing the deficits listed.    Rehab Potential Good   PT Frequency 2x / week   PT Duration 6 weeks   PT Treatment/Interventions ADLs/Self Care Home Management;Cryotherapy;Electrical Stimulation;Iontophoresis 4mg /ml Dexamethasone;Moist Heat;Ultrasound;Therapeutic activities;Therapeutic exercise;Passive range of motion;Dry needling;Taping;Manual techniques;Neuromuscular re-education   PT Next Visit Plan assess/review HEP, soft tissue work over glute med/ min, hip/ core strengthening, hip mobs? modalites PRN   PT Home Exercise Plan supine marching, lower trunk rotation hip flexor stretch, hip abuction   Consulted and Agree with Plan of Care Patient      Patient will benefit from skilled therapeutic intervention in order to improve the following deficits and impairments:  Abnormal gait, Pain, Improper body mechanics, Postural dysfunction, Decreased endurance, Decreased balance, Decreased activity tolerance, Decreased strength, Increased fascial restricitons, Decreased range of motion  Visit Diagnosis: Pain in right hip - Plan: PT plan of care cert/re-cert  Muscle weakness (generalized) - Plan: PT plan of care cert/re-cert  Cramp and spasm - Plan: PT plan of care cert/re-cert  Chronic right-sided low back pain, with sciatica presence unspecified - Plan: PT plan of care cert/re-cert      G-Codes - 12/02/70 1738    Functional Limitation  Changing and maintaining body position   Changing and Maintaining Body Position Current Status (Z3664) At least 60 percent but less than 80 percent impaired, limited or restricted   Changing and Maintaining Body Position Goal Status (Q0347) At least 40 percent but less than 60 percent impaired, limited or restricted       Problem List Patient Active Problem List   Diagnosis Date Noted  . History of colonic polyps 03/06/2017  . Asthma, chronic, mild persistent, uncomplicated 42/59/5638  . Chronic seasonal allergic rhinitis due to pollen 03/06/2017  . Gastroesophageal reflux disease without esophagitis 10/10/2014  . Current smoker on some days 09/28/2013  . Type 2 diabetes mellitus with complications ([place]) 75/64/3329  . Obesity (BMI 30-39.9) 07/12/2012  . Chronic back pain 07/12/2012  . Hypertension associated with diabetes ([place]) 07/12/2012   Starr Lake PT, DPT, LAT, ATC  04/13/17  5:40 PM      [facility] [address] [place], [place], [address] Phone: [phone]   Fax:  [phone]  Name: Dollye Glasser MRN: [id number] Date of Birth: 13-Sep-1956

## 2017-04-16 ENCOUNTER — Other Ambulatory Visit: Payer: Self-pay | Admitting: Family Medicine

## 2017-04-20 ENCOUNTER — Ambulatory Visit: Payer: Medicare Other | Admitting: Physical Therapy

## 2017-04-20 ENCOUNTER — Other Ambulatory Visit: Payer: Self-pay | Admitting: Family Medicine

## 2017-04-20 ENCOUNTER — Encounter: Payer: Self-pay | Admitting: Physical Therapy

## 2017-04-20 DIAGNOSIS — M6281 Muscle weakness (generalized): Secondary | ICD-10-CM

## 2017-04-20 DIAGNOSIS — R252 Cramp and spasm: Secondary | ICD-10-CM | POA: Diagnosis not present

## 2017-04-20 DIAGNOSIS — G8929 Other chronic pain: Secondary | ICD-10-CM | POA: Diagnosis not present

## 2017-04-20 DIAGNOSIS — M545 Low back pain: Secondary | ICD-10-CM | POA: Diagnosis not present

## 2017-04-20 DIAGNOSIS — M25551 Pain in right hip: Secondary | ICD-10-CM

## 2017-04-20 NOTE — Therapy (Signed)
[place], [place], [address] Phone: [phone]   Fax:  [phone]  Physical Therapy Treatment  Patient Details  Name: Findley Blankenbaker MRN: [id number] Date of Birth: 12-04-1955 Referring Provider: [name], MD  Encounter Date: 04/20/2017      PT End of Session - 04/20/17 1708    Visit Number 2   Number of Visits 13   Date for PT Re-Evaluation 05/25/17   Authorization Type MCR: kx mod by 15th visit, progress note by 10th visit.    PT Start Time 8676   PT Stop Time 1700  pt had to leave early to get the bus   PT Time Calculation (min) 29 min   Activity Tolerance Patient tolerated treatment well   Behavior During Therapy [facility] for tasks assessed/performed      Past Medical History:  Diagnosis Date  . Arthritis   . Asthma   . Colonic polyp   . Obesity   . Pain in limb   . Smoker   . Swelling of limb     Past Surgical History:  Procedure Laterality Date  . COLONOSCOPY  2009   Dr. Henrene Pastor    There were no vitals filed for this visit.      Subjective Assessment - 04/20/17 1637    Subjective " I have been doing by HEP and it seems like it may be helping"    Currently in Pain? Yes   Pain Score 0-No pain  took ibuprofen at 12   Pain Location Back   Pain Orientation Right   Pain Onset More than a month ago   Pain Frequency Intermittent   Aggravating Factors  prolonged standing/ walking, bending,    Pain Relieving Factors massage, biofreeze                         [facility] Adult PT Treatment/Exercise - 04/20/17 1639      Knee/Hip Exercises: Stretches   Hip Flexor Stretch 2 reps;30 seconds     Knee/Hip Exercises: Aerobic   Nustep L4 x 5 min LE only     Manual Therapy   Manual Therapy Joint mobilization;Soft tissue mobilization   Manual therapy comments manual trigger point release over R glute med   Joint Mobilization long axis distraction of the R hip grade  5, posterior R hip mobs grade 4   Soft tissue mobilization IASTM over the R glute glute med                PT Education - 04/20/17 1707    Education Details reviewed previously provided HEP    Person(s) Educated Patient   Methods Explanation;Verbal cues   Comprehension Verbalized understanding;Verbal cues required          PT Short Term Goals - 04/13/17 1734      PT SHORT TERM GOAL #1   Title to be I with inital HEP (03/04/2017)   Time 3   Period Weeks   Status New     PT SHORT TERM GOAL #2   Title to demo / verbalize proper posture and lifting/ carrying techniques to prevent and reduce low back/ hip pain (03/04/2017)   Time 3   Period Weeks   Status New           PT Long Term Goals - 04/13/17 1735      PT LONG TERM GOAL #1   Title pt will be I with  all HEP given as of last visit ( 05/25/2017)   Time 6   Period Weeks   Status New     PT LONG TERM GOAL #2   Title increase bil hip strength to >/= 4/5 to promote stability and walking/ standing endurance (05/25/2017)   Time 6   Period Weeks   Status New     PT LONG TERM GOAL #3   Title be able to sit/ stand/ walk for >/= 30 min with </= 2/10 pain for functional endurance required for ADLs (05/25/2017)   Time 6   Period Weeks   Status New     PT LONG TERM GOAL #4   Title improve FOTO score to </= 47% limited to demo improvement in function (05/25/2017)   Time 6   Period Weeks   Status New               Plan - 04/20/17 1709    Clinical Impression Statement shortened visit due to pt having to leave early today.  Reports she has been consistent with her HEP and today reports 0/10 pain due to taking advil earlier today. due to time constraints focused on strengthenig of the hips and manual to improve hip mobility. she reported no pain post session.    PT Next Visit Plan update HEP PRN, soft tissue work over glute med/ min, hip/ core strengthening, hip mobs? modalites PRN   Consulted and Agree with Plan  of Care Patient      Patient will benefit from skilled therapeutic intervention in order to improve the following deficits and impairments:  Abnormal gait, Pain, Improper body mechanics, Postural dysfunction, Decreased endurance, Decreased balance, Decreased activity tolerance, Decreased strength, Increased fascial restricitons, Decreased range of motion  Visit Diagnosis: Pain in right hip  Muscle weakness (generalized)  Cramp and spasm  Chronic right-sided low back pain, with sciatica presence unspecified     Problem List Patient Active Problem List   Diagnosis Date Noted  . History of colonic polyps 03/06/2017  . Asthma, chronic, mild persistent, uncomplicated 82/70/7867  . Chronic seasonal allergic rhinitis due to pollen 03/06/2017  . Gastroesophageal reflux disease without esophagitis 10/10/2014  . Current smoker on some days 09/28/2013  . Type 2 diabetes mellitus with complications ([place]) 54/49/2010  . Obesity (BMI 30-39.9) 07/12/2012  . Chronic back pain 07/12/2012  . Hypertension associated with diabetes ([place]) 07/12/2012   Starr Lake PT, DPT, LAT, ATC  04/20/17  5:12 PM      [place] [facility] [address] [place], [place], [address] Phone: [phone]   Fax:  [phone]  Name: Demetria Iwai MRN: [id number] Date of Birth: 02/18/1956

## 2017-04-29 ENCOUNTER — Encounter: Payer: Self-pay | Admitting: Physical Therapy

## 2017-04-29 ENCOUNTER — Ambulatory Visit: Payer: Medicare Other | Admitting: Physical Therapy

## 2017-04-29 DIAGNOSIS — R252 Cramp and spasm: Secondary | ICD-10-CM | POA: Diagnosis not present

## 2017-04-29 DIAGNOSIS — G8929 Other chronic pain: Secondary | ICD-10-CM

## 2017-04-29 DIAGNOSIS — M6281 Muscle weakness (generalized): Secondary | ICD-10-CM

## 2017-04-29 DIAGNOSIS — M25551 Pain in right hip: Secondary | ICD-10-CM

## 2017-04-29 DIAGNOSIS — M545 Low back pain: Secondary | ICD-10-CM | POA: Diagnosis not present

## 2017-04-29 NOTE — Patient Instructions (Signed)

## 2017-04-29 NOTE — Therapy (Signed)
[place], [place], [address] Phone: [phone]   Fax:  [phone]  Physical Therapy Treatment  Patient Details  Name: [name] MRN: [id number] Date of Birth: [birth year] Referring Provider: [name], MD  Encounter Date: 04/29/2017      PT End of Session - 04/29/17 1715    Visit Number 2   Number of Visits 13   Date for PT Re-Evaluation 05/25/17   PT Start Time 1633  Short session due to needing to catch the bus,  charge will not equal time slot   PT Stop Time 1700   PT Time Calculation (min) 27 min   Activity Tolerance Patient tolerated treatment well   Behavior During Therapy [facility] for tasks assessed/performed      Past Medical History:  Diagnosis Date  . Arthritis   . Asthma   . Colonic polyp   . Obesity   . Pain in limb   . Smoker   . Swelling of limb     Past Surgical History:  Procedure Laterality Date  . COLONOSCOPY  2009   Dr. Henrene Pastor    There were no vitals filed for this visit.      Subjective Assessment - 04/29/17 1637    Subjective 7/10.  I am feeling a little better.  I have not had leg pain today.   Currently in Pain? Yes   Pain Score 7    Pain Location Back   Pain Orientation Right   Pain Descriptors / Indicators Aching;Sore  Twisting   Pain Type Chronic pain   Pain Radiating Towards Not today   Pain Frequency Intermittent   Aggravating Factors  walking,  standing too long.  Sleeping on right side.  staying on her back too long.   Pain Relieving Factors ibuprophen,  stretching,  biofreeze,  massage   Effect of Pain on Daily Activities using cart to shop,                         [facility] Adult PT Treatment/Exercise - 04/29/17 0001      Self-Care   Self-Care ADL's;Posture   ADL's avoid forward trunk use legs  needs more education   Posture standing on side     Lumbar Exercises: Supine   Bent Knee Raise 5 reps  3 sets   Bent  Knee Raise Limitations cues and hamstring cramps     Knee/Hip Exercises: Stretches   Active Hamstring Stretch Limitations 1 x 20 seconds, stopped due to increased pain   Hip Flexor Stretch 2 reps;30 seconds   Hip Flexor Stretch Limitations HS cramping with this  she does not do this much at home   Other Knee/Hip Stretches Lower trunk rotation 20 X , hold 1 second     Knee/Hip Exercises: Standing   Abduction Limitations easier to do standing, less cramps     Knee/Hip Exercises: Sidelying   Hip ABduction Limitations too hard to do at home.     Manual Therapy   Soft tissue mobilization rolling pin lateral thigh, and gluteals tissue softened                PT Education - 04/29/17 1709    Education provided Yes   Education Details Review previous HEP,.  ADL body mechanics    Person(s) Educated Patient   Methods Explanation;Demonstration;Tactile cues;Verbal cues;Handout   Comprehension Verbalized understanding;Returned demonstration;Need further instruction  PT Short Term Goals - 04/29/17 1720      PT SHORT TERM GOAL #1   Title to be I with inital HEP (03/04/2017)   Baseline Moderate cues   Time 3   Period Weeks   Status On-going     PT SHORT TERM GOAL #2   Title to demo / verbalize proper posture and lifting/ carrying techniques to prevent and reduce low back/ hip pain (03/04/2017)   Baseline education initiated today   Time 3   Period Weeks   Status On-going           PT Long Term Goals - 04/13/17 1735      PT LONG TERM GOAL #1   Title pt will be I with all HEP given as of last visit ( 05/25/2017)   Time 6   Period Weeks   Status New     PT LONG TERM GOAL #2   Title increase bil hip strength to >/= 4/5 to promote stability and walking/ standing endurance (05/25/2017)   Time 6   Period Weeks   Status New     PT LONG TERM GOAL #3   Title be able to sit/ stand/ walk for >/= 30 min with </= 2/10 pain for functional endurance required for ADLs  (05/25/2017)   Time 6   Period Weeks   Status New     PT LONG TERM GOAL #4   Title improve FOTO score to </= 47% limited to demo improvement in function (05/25/2017)   Time 6   Period Weeks   Status New               Plan - 04/29/17 1716    Clinical Impression Statement Shortened visit continued today due to needing to catch the bus.  Patient needed moderate cues for HEP.  I get the idea that she does not comply with HEP.  Cramps in right hamstrings were intermittant today limiting focus on specific exercise.  mild pain post session.   PT Treatment/Interventions ADLs/Self Care Home Management;Cryotherapy;Electrical Stimulation;Iontophoresis 4mg /ml Dexamethasone;Moist Heat;Ultrasound;Therapeutic activities;Therapeutic exercise;Passive range of motion;Dry needling;Taping;Manual techniques;Neuromuscular re-education   PT Next Visit Plan update HEP PRN, soft tissue work over glute med/ min, hip/ core strengthening, hip mobs? modalites PRN,  answer any body mechanice questions, review as needed.  Simplify HEP if unable to comply.   PT Home Exercise Plan supine marching, lower trunk rotation hip flexor stretch, hip abuction   Consulted and Agree with Plan of Care Patient      Patient will benefit from skilled therapeutic intervention in order to improve the following deficits and impairments:  Abnormal gait, Pain, Improper body mechanics, Postural dysfunction, Decreased endurance, Decreased balance, Decreased activity tolerance, Decreased strength, Increased fascial restricitons, Decreased range of motion  Visit Diagnosis: Pain in right hip  Muscle weakness (generalized)  Cramp and spasm  Chronic right-sided low back pain, with sciatica presence unspecified     Problem List Patient Active Problem List   Diagnosis Date Noted  . History of colonic polyps 03/06/2017  . Asthma, chronic, mild persistent, uncomplicated 93/71/6967  . Chronic seasonal allergic rhinitis due to pollen  03/06/2017  . Gastroesophageal reflux disease without esophagitis 10/10/2014  . Current smoker on some days 09/28/2013  . Type 2 diabetes mellitus with complications ([place]) 89/38/1017  . Obesity (BMI 30-39.9) 07/12/2012  . Chronic back pain 07/12/2012  . Hypertension associated with diabetes ([place]) 07/12/2012    Cerria Randhawa PTA 04/29/2017, 5:21 PM  [facility]  [place], [place], [address] Phone: [phone]   Fax:  [phone]  Name: [name] MRN: [id number] Date of Birth: [birth year]

## 2017-05-04 ENCOUNTER — Ambulatory Visit: Payer: Medicare Other | Admitting: Physical Therapy

## 2017-05-06 ENCOUNTER — Ambulatory Visit: Payer: Medicare Other | Admitting: Physical Therapy

## 2017-05-11 ENCOUNTER — Ambulatory Visit: Payer: Medicare Other | Admitting: Physical Therapy

## 2017-05-13 ENCOUNTER — Ambulatory Visit: Payer: Medicare Other | Attending: Family Medicine | Admitting: Physical Therapy

## 2017-05-13 ENCOUNTER — Encounter: Payer: Self-pay | Admitting: Physical Therapy

## 2017-05-13 DIAGNOSIS — R252 Cramp and spasm: Secondary | ICD-10-CM

## 2017-05-13 DIAGNOSIS — G8929 Other chronic pain: Secondary | ICD-10-CM | POA: Diagnosis not present

## 2017-05-13 DIAGNOSIS — M25551 Pain in right hip: Secondary | ICD-10-CM | POA: Diagnosis not present

## 2017-05-13 DIAGNOSIS — M545 Low back pain: Secondary | ICD-10-CM | POA: Insufficient documentation

## 2017-05-13 DIAGNOSIS — M6281 Muscle weakness (generalized): Secondary | ICD-10-CM | POA: Diagnosis not present

## 2017-05-13 NOTE — Therapy (Signed)
[place], [place], [address] Phone: [phone]   Fax:  [phone]  Physical Therapy Treatment  Patient Details  Name: [name] MRN: [id number] Date of Birth: July 17, 1956 Referring Provider: [name], MD  Encounter Date: 05/13/2017      PT End of Session - 05/13/17 1734    Visit Number 3   Number of Visits 13   Date for PT Re-Evaluation 05/25/17   PT Start Time 1625   PT Stop Time 1709   PT Time Calculation (min) 44 min   Activity Tolerance Patient tolerated treatment well   Behavior During Therapy [facility] for tasks assessed/performed      Past Medical History:  Diagnosis Date  . Arthritis   . Asthma   . Colonic polyp   . Obesity   . Pain in limb   . Smoker   . Swelling of limb     Past Surgical History:  Procedure Laterality Date  . COLONOSCOPY  2009   Dr. Henrene Pastor    There were no vitals filed for this visit.      Subjective Assessment - 05/13/17 1625    Subjective no pain took ibuprophen 4 hours ago.  Right knee hurt with steps ,,Felt like it was going to  give away.  Exercises help.  First day no volunteering.    Currently in Pain? No/denies   Pain Score 0-No pain   Pain Location Back   Pain Orientation Right   Pain Descriptors / Indicators --  right hip    Pain Radiating Towards no   Pain Frequency Intermittent   Aggravating Factors  not taking meds   Pain Relieving Factors ibuprophen,  stretching,  biofreeze   Effect of Pain on Daily Activities using cart to  shop,  pray,  soft tissue work            [facility] PT Assessment - 05/13/17 0001      Strength   Right Hip Extension 4-/5   Right Hip ABduction 4-/5   Left Hip Extension 4/5   Left Hip ABduction 4/5                     [facility] Adult PT Treatment/Exercise - 05/13/17 0001      Self-Care   ADL's suggestions for 10 hour road trip and back care, support [place], frequent rests with  walking,  correct seat back position. take turns driving   Posture trial SI Belt.  helped reduce pain, sensation into right leg reduced.      Lumbar Exercises: Stretches   Passive Hamstring Stretch 2 reps;30 seconds   Passive Hamstring Stretch Limitations to reduce cramp   Prone on Elbows Stretch 10 seconds   Press Ups 5 reps  patient showed me how she was doing at home,  cued slower     Lumbar Exercises: Supine   Bent Knee Raise 5 reps     Lumbar Exercises: Prone   Other Prone Lumbar Exercises plank from elbows to knees, cued for pelvic tilt, breathing.      Knee/Hip Exercises: Stretches   Other Knee/Hip Stretches 10 x 1 second each     Knee/Hip Exercises: Aerobic   Recumbent Bike 6 minutes lL1-3  with rest at 2.5 minutes.  noted post that right leg felt numb to ankle and crampy     Knee/Hip Exercises: Standing   Abduction Limitations 10 X each, cued for neutral hip.  Knee/Hip Exercises: Sidelying   Hip ABduction Limitations 5 reps each min assist for position hip each side.                 PT Education - 05/13/17 1734    Education provided Yes   Education Details Self care for road trip,  possible use of SI Belt or similar to control pain.   Person(s) Educated Patient   Methods Explanation;Demonstration   Comprehension Verbalized understanding          PT Short Term Goals - 05/13/17 1735      PT SHORT TERM GOAL #1   Title to be I with inital HEP (03/04/2017)   Baseline minimal cues (improving)   Time 3   Period Weeks   Status On-going     PT SHORT TERM GOAL #2   Title to demo / verbalize proper posture and lifting/ carrying techniques to prevent and reduce low back/ hip pain (03/04/2017)   Baseline used good posture to get glasses off floor,  tries to do at home   Time 3   Period Weeks   Status Partially Met           PT Long Term Goals - 04/13/17 1735      PT LONG TERM GOAL #1   Title pt will be I with all HEP given as of last visit (  05/25/2017)   Time 6   Period Weeks   Status New     PT LONG TERM GOAL #2   Title increase bil hip strength to >/= 4/5 to promote stability and walking/ standing endurance (05/25/2017)   Time 6   Period Weeks   Status New     PT LONG TERM GOAL #3   Title be able to sit/ stand/ walk for >/= 30 min with </= 2/10 pain for functional endurance required for ADLs (05/25/2017)   Time 6   Period Weeks   Status New     PT LONG TERM GOAL #4   Title improve FOTO score to </= 47% limited to demo improvement in function (05/25/2017)   Time 6   Period Weeks   Status New               Plan - 05/13/17 1738    Clinical Impression Statement Pain and increased sensations of weakness and crampinf noted with exercise.(Patient did not always inform me of pain/numbness increase with exercises until end of exercise)  These were helped with SI belt simulation.  Patient had 2 cramps in hamstrings this session (Less than last visit).  For bed mobility patient just threw herself over with momentum.  Needs more core work.  Hip strenght improving within available range for extension and abduction 4- to 4/5 each)  see flow sheet.    PT Next Visit Plan update HEP PRN,  see how long road trip went. , hip/ core strengthening, hip mobs? modalites PRN,  answer any body mechanice questions, review as needed.     PT Home Exercise Plan supine marching, lower trunk rotation hip flexor stretch, hip abuction   Consulted and Agree with Plan of Care Patient      Patient will benefit from skilled therapeutic intervention in order to improve the following deficits and impairments:  Abnormal gait, Pain, Improper body mechanics, Postural dysfunction, Decreased endurance, Decreased balance, Decreased activity tolerance, Decreased strength, Increased fascial restricitons, Decreased range of motion  Visit Diagnosis: Pain in right hip  Muscle weakness (generalized)  Cramp and spasm  Chronic right-sided low back pain, with  sciatica presence unspecified     Problem List Patient Active Problem List   Diagnosis Date Noted  . History of colonic polyps 03/06/2017  . Asthma, chronic, mild persistent, uncomplicated 99/37/1696  . Chronic seasonal allergic rhinitis due to pollen 03/06/2017  . Gastroesophageal reflux disease without esophagitis 10/10/2014  . Current smoker on some days 09/28/2013  . Type 2 diabetes mellitus with complications ([place]) 78/93/8101  . Obesity (BMI 30-39.9) 07/12/2012  . Chronic back pain 07/12/2012  . Hypertension associated with diabetes ([place]) 07/12/2012    Solimar Maiden PTA 05/13/2017, 5:43 PM  [facility] [address] [place], [place], [address] Phone: [phone]   Fax:  [phone]  Name: [name] MRN: [id number] Date of Birth: [birth year]

## 2017-06-02 ENCOUNTER — Encounter: Payer: Self-pay | Admitting: Physical Therapy

## 2017-06-02 ENCOUNTER — Ambulatory Visit: Payer: Medicare Other | Attending: Family Medicine | Admitting: Physical Therapy

## 2017-06-02 DIAGNOSIS — M6281 Muscle weakness (generalized): Secondary | ICD-10-CM

## 2017-06-02 DIAGNOSIS — R252 Cramp and spasm: Secondary | ICD-10-CM | POA: Diagnosis not present

## 2017-06-02 DIAGNOSIS — M25551 Pain in right hip: Secondary | ICD-10-CM | POA: Insufficient documentation

## 2017-06-02 DIAGNOSIS — G8929 Other chronic pain: Secondary | ICD-10-CM

## 2017-06-02 DIAGNOSIS — M545 Low back pain: Secondary | ICD-10-CM | POA: Insufficient documentation

## 2017-06-02 NOTE — Patient Instructions (Signed)

## 2017-06-02 NOTE — Therapy (Signed)
[place], [place], [address] Phone: [phone]   Fax:  [phone]  Physical Therapy Treatment / Re-certification  Patient Details  Name: [name] MRN: [id number] Date of Birth: Feb 12, 1956 Referring Provider: [name], MD  Encounter Date: 06/02/2017      PT End of Session - 06/02/17 1511    Visit Number 4   Number of Visits 13   Date for PT Re-Evaluation 07/14/17   Authorization Type MCR: kx mod by 15th visit, progress note by 10th visit.    PT Start Time 1511  pt arrived 11 min late today   PT Stop Time 1559   PT Time Calculation (min) 48 min   Activity Tolerance Patient tolerated treatment well   Behavior During Therapy [facility] for tasks assessed/performed      Past Medical History:  Diagnosis Date  . Arthritis   . Asthma   . Colonic polyp   . Obesity   . Pain in limb   . Smoker   . Swelling of limb     Past Surgical History:  Procedure Laterality Date  . COLONOSCOPY  2009   Dr. Henrene Pastor    There were no vitals filed for this visit.      Subjective Assessment - 06/02/17 1511    Subjective "The back is better, but it still has been bothering and I have been doing some of the exercises"    Currently in Pain? Yes   Pain Score 4    Pain Location Back   Pain Orientation Right   Pain Type Chronic pain   Pain Onset More than a month ago   Pain Frequency Intermittent   Aggravating Factors  staying in one position, sitting on the bus seat   Pain Relieving Factors ibuprofen, stretching, biofreeze            [facility] PT Assessment - 06/02/17 1517      Observation/Other Assessments   Focus on Therapeutic Outcomes (FOTO)  47% limited     AROM   Lumbar Flexion 70   Lumbar Extension 18   Lumbar - Right Side Bend 10   Lumbar - Left Side Bend 18     Strength   Right Hip Flexion 4-/5   Right Hip ABduction 4-/5   Left Hip Flexion 4-/5   Left Hip ABduction 4/5                      [facility] Adult PT Treatment/Exercise - 06/02/17 1536      Manual Therapy   Manual therapy comments manual trigger point release over R glute med, piriformis    Joint Mobilization long axis distraction of the R hip grade 5, posterior R hip mobs grade 4   Soft tissue mobilization rolling pin lateral thigh, and gluteals tissue softened                PT Education - 06/02/17 1524    Education provided Yes   Education Details Reviewed posture handout, reviewed HEP and importance of continued exercise.   Person(s) Educated Patient   Methods Explanation;Verbal cues;Handout   Comprehension Verbalized understanding;Verbal cues required          PT Short Term Goals - 06/02/17 1521      PT SHORT TERM GOAL #1   Title to be I with inital HEP (03/04/2017)   Time 3   Period Weeks   Status Achieved     PT  SHORT TERM GOAL #2   Title to demo / verbalize proper posture and lifting/ carrying techniques to prevent and reduce low back/ hip pain (03/04/2017)   Time 3   Period Weeks   Status Partially Met           PT Long Term Goals - Jul 02, 2017 1523      PT LONG TERM GOAL #1   Title pt will be I with all HEP given as of last visit ( 05/25/2017)   Time 6   Period Weeks   Status On-going     PT LONG TERM GOAL #2   Title increase bil hip strength to >/= 4/5 to promote stability and walking/ standing endurance (05/25/2017)   Time 6   Period Weeks   Status On-going     PT LONG TERM GOAL #3   Title be able to sit/ stand/ walk for >/= 30 min with </= 2/10 pain for functional endurance required for ADLs (05/25/2017)   Period Weeks   Status On-going     PT LONG TERM GOAL #4   Title improve FOTO score to </= 47% limited to demo improvement in function (05/25/2017)   Time 6   Period Weeks   Status Achieved               Plan - 07/02/2017 1547    Clinical Impression Statement pt demonstrates improvement in pain and mobility since previous measures and  improved her FOTO score. continued manual over lateral hip and low back and hip mobs. post session she reported improvement in pain. plan to continued with current POC to work toward remaining goals and promote independent exercise.    PT Frequency 2x / week   PT Duration 4 weeks   PT Next Visit Plan update HEP PRN, hip mobs, hip/ core strengthenin modalites PRN,   PT Home Exercise Plan supine marching, lower trunk rotation hip flexor stretch, hip abuction, posture/ biomechanics   Consulted and Agree with Plan of Care Patient      Patient will benefit from skilled therapeutic intervention in order to improve the following deficits and impairments:  Abnormal gait, Pain, Improper body mechanics, Postural dysfunction, Decreased endurance, Decreased balance, Decreased activity tolerance, Decreased strength, Increased fascial restricitons, Decreased range of motion  Visit Diagnosis: Pain in right hip - Plan: PT plan of care cert/re-cert  Muscle weakness (generalized) - Plan: PT plan of care cert/re-cert  Cramp and spasm - Plan: PT plan of care cert/re-cert  Chronic right-sided low back pain, with sciatica presence unspecified - Plan: PT plan of care cert/re-cert       G-Codes - 02-Jul-2017 1549    Functional Assessment Tool Used (Outpatient Only) clinical judgement/ FOTO/ MMT   Functional Limitation Changing and maintaining body position   Changing and Maintaining Body Position Current Status (T4656) At least 60 percent but less than 80 percent impaired, limited or restricted   Changing and Maintaining Body Position Goal Status (C1275) At least 40 percent but less than 60 percent impaired, limited or restricted      Problem List Patient Active Problem List   Diagnosis Date Noted  . History of colonic polyps 03/06/2017  . Asthma, chronic, mild persistent, uncomplicated 17/00/1749  . Chronic seasonal allergic rhinitis due to pollen 03/06/2017  . Gastroesophageal reflux disease without  esophagitis 10/10/2014  . Current smoker on some days 09/28/2013  . Type 2 diabetes mellitus with complications ([place]) 44/96/7591  . Obesity (BMI 30-39.9) 07/12/2012  . Chronic back pain 07/12/2012  .  Hypertension associated with diabetes ([place]) 07/12/2012   Starr Lake PT, DPT, LAT, ATC  06/02/17  3:52 PM      [facility] [address] [place], [place], [address] Phone: [phone]   Fax:  [phone]  Name: [name] MRN: [id number] Date of Birth: 11/30/1956

## 2017-06-19 ENCOUNTER — Encounter: Payer: Self-pay | Admitting: Family Medicine

## 2017-06-23 ENCOUNTER — Ambulatory Visit: Payer: Medicare Other | Admitting: Physical Therapy

## 2017-07-06 ENCOUNTER — Encounter: Payer: Self-pay | Admitting: Physical Therapy

## 2017-07-06 ENCOUNTER — Ambulatory Visit: Payer: Medicare Other | Attending: Family Medicine | Admitting: Physical Therapy

## 2017-07-06 DIAGNOSIS — M25551 Pain in right hip: Secondary | ICD-10-CM

## 2017-07-06 DIAGNOSIS — G8929 Other chronic pain: Secondary | ICD-10-CM

## 2017-07-06 DIAGNOSIS — M6281 Muscle weakness (generalized): Secondary | ICD-10-CM | POA: Diagnosis not present

## 2017-07-06 DIAGNOSIS — M545 Low back pain: Secondary | ICD-10-CM | POA: Diagnosis not present

## 2017-07-06 DIAGNOSIS — R252 Cramp and spasm: Secondary | ICD-10-CM

## 2017-07-06 NOTE — Therapy (Addendum)
[place], [place], [address] Phone: [phone]   Fax:  [phone]  Physical Therapy Treatment  Patient Details  Name: [name] MRN: [id number] Date of Birth: [birth year] Referring Provider: [name], MD  Encounter Date: 07/06/2017     PT End of Session - 07/06/17 1421    Visit Number 6   Number of Visits 13   Date for PT Re-Evaluation 07/14/17   PT Start Time 4158   PT Stop Time 1501   PT Time Calculation (min) 41 min   Activity Tolerance Patient tolerated treatment well   Behavior During Therapy [facility] for tasks assessed/performed       Past Medical History:  Diagnosis Date  . Arthritis   . Asthma   . Colonic polyp   . Obesity   . Pain in limb   . Smoker   . Swelling of limb     Past Surgical History:  Procedure Laterality Date  . COLONOSCOPY  2009   Dr. Henrene Pastor    There were no vitals filed for this visit.      Subjective Assessment - 07/06/17 1421    Subjective "I've been    Currently in Pain? Yes  took medication this AM   Pain Score 0-No pain   Pain Location Back   Pain Orientation Right   Pain Descriptors / Indicators Aching;Sore   Pain Onset More than a month ago   Aggravating Factors  laying down/ sleeping, getting upin the morning   Pain Relieving Factors ibuprofen, stretching, biofreeze                         [facility] Adult PT Treatment/Exercise - 07/06/17 1426      Lumbar Exercises: Stretches   Single Knee to Chest Stretch 2 reps;30 seconds     Knee/Hip Exercises: Aerobic   Stationary Bike L2 x 6 min  verbal cues to finish exercise     Knee/Hip Exercises: Standing   Other Standing Knee Exercises marching in place 2 x 10     Knee/Hip Exercises: Seated   Sit to Sand 1 set;10 reps;without UE support  with glute squeeze     Knee/Hip Exercises: Supine   Bridges Limitations 1 x 5  halted due to cramping     Manual Therapy   Manual therapy comments manual trigger point release over R glute med, piriformis   how to perform at home.    Joint Mobilization long axis distraction of the R hip grade 5, posterior R hip mobs grade 4                  PT Short Term Goals - 06/02/17 1521      PT SHORT TERM GOAL #1   Title to be I with inital HEP (03/04/2017)   Time 3   Period Weeks   Status Achieved     PT SHORT TERM GOAL #2   Title to demo / verbalize proper posture and lifting/ carrying techniques to prevent and reduce low back/ hip pain (03/04/2017)   Time 3   Period Weeks   Status Partially Met           PT Long Term Goals - 06/02/17 1523      PT LONG TERM GOAL #1   Title pt will be I with all HEP given as of last visit ( 05/25/2017)   Time 6   Period Weeks  Status On-going     PT LONG TERM GOAL #2   Title increase bil hip strength to >/= 4/5 to promote stability and walking/ standing endurance (05/25/2017)   Time 6   Period Weeks   Status On-going     PT LONG TERM GOAL #3   Title be able to sit/ stand/ walk for >/= 30 min with </= 2/10 pain for functional endurance required for ADLs (05/25/2017)   Period Weeks   Status On-going     PT LONG TERM GOAL #4   Title improve FOTO score to </= 47% limited to demo improvement in function (05/25/2017)   Time 6   Period Weeks   Status Achieved               Plan - 07/06/17 1500    Clinical Impression Statement pt reports no pain today.  focused on hip stretching and manual techniques to improve mobility and decrease pain. She was able to perform all exercise with no report of pain or soreness.    PT Next Visit Plan assess ROM, goals, FOTO, update HEP PRN, hip mobs, hip/ core strengthenin modalites PRN,   PT Home Exercise Plan supine marching, lower trunk rotation hip flexor stretch, hip abuction, posture/ biomechanics, sit to stand, standing marching, single knee to chest,    Consulted and Agree with Plan of Care Patient      Patient  will benefit from skilled therapeutic intervention in order to improve the following deficits and impairments:     Visit Diagnosis: Muscle weakness (generalized)  Cramp and spasm  Pain in right hip  Chronic right-sided low back pain, with sciatica presence unspecified     Problem List Patient Active Problem List   Diagnosis Date Noted  . History of colonic polyps 03/06/2017  . Asthma, chronic, mild persistent, uncomplicated 80/88/1103  . Chronic seasonal allergic rhinitis due to pollen 03/06/2017  . Gastroesophageal reflux disease without esophagitis 10/10/2014  . Current smoker on some days 09/28/2013  . Type 2 diabetes mellitus with complications ([place]) 15/94/5859  . Obesity (BMI 30-39.9) 07/12/2012  . Chronic back pain 07/12/2012  . Hypertension associated with diabetes ([place]) 07/12/2012   Starr Lake PT, DPT, LAT, ATC  07/06/17  3:03 PM      [facility] [address] [place], [place], [address] Phone: [phone]   Fax:  [phone]  Name: [name] MRN: [id number] Date of Birth: [birth year]

## 2017-07-13 ENCOUNTER — Encounter: Payer: Medicare Other | Admitting: Physical Therapy

## 2017-07-14 ENCOUNTER — Ambulatory Visit: Payer: Medicare Other | Admitting: Physical Therapy

## 2017-07-15 ENCOUNTER — Ambulatory Visit: Payer: Medicare Other | Admitting: Physical Therapy

## 2017-07-15 ENCOUNTER — Encounter: Payer: Self-pay | Admitting: Physical Therapy

## 2017-07-15 DIAGNOSIS — M25551 Pain in right hip: Secondary | ICD-10-CM

## 2017-07-15 DIAGNOSIS — M545 Low back pain: Secondary | ICD-10-CM

## 2017-07-15 DIAGNOSIS — M6281 Muscle weakness (generalized): Secondary | ICD-10-CM

## 2017-07-15 DIAGNOSIS — R252 Cramp and spasm: Secondary | ICD-10-CM

## 2017-07-15 DIAGNOSIS — G8929 Other chronic pain: Secondary | ICD-10-CM

## 2017-07-15 NOTE — Therapy (Signed)
[place], [place], [address] Phone: [phone]   Fax:  [phone]  Physical Therapy Treatment/ Re-certification  Patient Details  Name: [name] MRN: [id number] Date of Birth: 1956-08-25 Referring Provider: [name], MD  Encounter Date: 07/15/2017      PT End of Session - 07/15/17 0941    Visit Number 7   Number of Visits 13   Date for PT Re-Evaluation 08/12/17   Authorization Type MCR: kx mod by 15th visit, progress note by 10th visit.    PT Start Time 0930   PT Stop Time 1010   PT Time Calculation (min) 40 min   Activity Tolerance Patient tolerated treatment well   Behavior During Therapy WFL for tasks assessed/performed      Past Medical History:  Diagnosis Date  . Arthritis   . Asthma   . Colonic polyp   . Obesity   . Pain in limb   . Smoker   . Swelling of limb     Past Surgical History:  Procedure Laterality Date  . COLONOSCOPY  2009   Dr. Henrene Pastor    There were no vitals filed for this visit.      Subjective Assessment - 07/15/17 0928    Subjective "Im not doing too bad today, and the exercise and the tennis ball thing you showed me last week really helped, pain is only 2/10"   Currently in Pain? Yes   Pain Score 2    Pain Location Back   Pain Orientation Mid;Lower   Pain Descriptors / Indicators Sore   Pain Type Chronic pain   Pain Onset More than a month ago   Pain Frequency Intermittent   Aggravating Factors  laying down and sleeping   Pain Relieving Factors using the tennis ball, stretching, biofreeze            [facility] PT Assessment - 07/15/17 0949      Observation/Other Assessments   Focus on Therapeutic Outcomes (FOTO)  52% limited increaesed from (47% limited)     AROM   Lumbar Flexion 80   Lumbar Extension 20   Lumbar - Right Side Bend 12   Lumbar - Left Side Bend 18     Strength   Right Hip Flexion 4/5   Right Hip Extension 4-/5    Right Hip ABduction 4/5   Left Hip Flexion 4/5   Left Hip Extension 4/5   Left Hip ABduction 4/5   Left Hip ADduction 4/5                     [facility] Adult PT Treatment/Exercise - 07/15/17 0936      Self-Care   Posture avoiding excessive weight shifting to decrease glute med issues and low back pain.      Lumbar Exercises: Stretches   Active Hamstring Stretch 2 reps;30 seconds  bil with strap   Single Knee to Chest Stretch 2 reps;30 seconds   Lower Trunk Rotation 5 reps;10 seconds  bil     Knee/Hip Exercises: Aerobic   Nustep L5 x 6 min LE only     Knee/Hip Exercises: Standing   Gait Training walking 5 x 30 ft with heel lift in LLE      Manual Therapy   Manual therapy comments manual trigger point release over R glute med, piriformis    Soft tissue mobilization rolling pin over the lateral glute med  PT Education - 07/15/17 1014    Education provided Yes   Education Details benefits using heel lift for possible leg length issue on the LLE, and avoiding excessive hip popping/ jutting with weight shifting.    Person(s) Educated Patient   Methods Explanation;Verbal cues   Comprehension Verbalized understanding;Verbal cues required          PT Short Term Goals - 07/15/17 1001      PT SHORT TERM GOAL #1   Title to be I with inital HEP (03/04/2017)   Time 3   Period Weeks   Status Achieved     PT SHORT TERM GOAL #2   Title to demo / verbalize proper posture and lifting/ carrying techniques to prevent and reduce low back/ hip pain (03/04/2017)   Time 3   Period Weeks   Status Achieved           PT Long Term Goals - 07/15/17 1001      PT LONG TERM GOAL #1   Title pt will be I with all HEP given as of last visit ( 05/25/2017)   Time 6   Period Weeks   Status On-going     PT LONG TERM GOAL #2   Title increase bil hip strength to >/= 4/5 to promote stability and walking/ standing endurance (05/25/2017)   Time 6   Period Weeks    Status On-going     PT LONG TERM GOAL #3   Title be able to sit/ stand/ walk for >/= 30 min with </= 2/10 pain for functional endurance required for ADLs (05/25/2017)   Time 6   Period Weeks   Status On-going     PT LONG TERM GOAL #4   Title improve FOTO score to </= 47% limited to demo improvement in function (05/25/2017)   Time 6   Period Weeks   Status Partially Met               Plan - 07/15/17 1008    Clinical Impression Statement pt reports decreased pain and tension in the hip/ low back. she is progressing with goals. she demonstrates possible length length with LLE being shorter. provided heel lift for left and she was able to ambulate reporting only feeling weird but no pain. coninuted manual techniques for L hip. She would benefit from conintued physical therapy to decrease hip/ low back pain, improve mobiliity and endurance, promote independent exercise and meet remaining goals.    Rehab Potential Good   PT Frequency 2x / week   PT Duration 4 weeks   PT Treatment/Interventions ADLs/Self Care Home Management;Cryotherapy;Electrical Stimulation;Iontophoresis 63m/ml Dexamethasone;Moist Heat;Ultrasound;Therapeutic activities;Therapeutic exercise;Passive range of motion;Dry needling;Taping;Manual techniques;Neuromuscular re-education   PT Next Visit Plan update HEP PRN, hip mobs, hip/ core strengthenin modalites PRN, how is heel lift? posture with standing/ walking, avoiding excessive weight shifting   PT Home Exercise Plan supine marching, lower trunk rotation hip flexor stretch, hip abuction, posture/ biomechanics, sit to stand, standing marching, single knee to chest,    Consulted and Agree with Plan of Care Patient      Patient will benefit from skilled therapeutic intervention in order to improve the following deficits and impairments:  Abnormal gait, Pain, Improper body mechanics, Postural dysfunction, Decreased endurance, Decreased balance, Decreased activity  tolerance, Decreased strength, Increased fascial restricitons, Decreased range of motion  Visit Diagnosis: Muscle weakness (generalized)  Cramp and spasm  Pain in right hip  Chronic right-sided low back pain, with sciatica presence unspecified  G-Codes - 07/15/17 1015    Functional Assessment Tool Used (Outpatient Only) clinical judgement/ FOTO/ MMT   Functional Limitation Changing and maintaining body position   Changing and Maintaining Body Position Current Status (Q0164) At least 60 percent but less than 80 percent impaired, limited or restricted   Changing and Maintaining Body Position Goal Status (W9037) At least 40 percent but less than 60 percent impaired, limited or restricted      Problem List Patient Active Problem List   Diagnosis Date Noted  . History of colonic polyps 03/06/2017  . Asthma, chronic, mild persistent, uncomplicated 95/58/3167  . Chronic seasonal allergic rhinitis due to pollen 03/06/2017  . Gastroesophageal reflux disease without esophagitis 10/10/2014  . Current smoker on some days 09/28/2013  . Type 2 diabetes mellitus with complications ([place]) 42/55/2589  . Obesity (BMI 30-39.9) 07/12/2012  . Chronic back pain 07/12/2012  . Hypertension associated with diabetes ([place]) 07/12/2012   Starr Lake PT, DPT, LAT, ATC  07/15/17  10:16 AM      [facility] [address] [place], [place], [address] Phone: [phone]   Fax:  [phone]  Name: [name] MRN: [id number] Date of Birth: Nov 24, 1956

## 2017-07-16 ENCOUNTER — Encounter: Payer: Self-pay | Admitting: Physical Therapy

## 2017-07-16 ENCOUNTER — Ambulatory Visit: Payer: Medicare Other | Admitting: Physical Therapy

## 2017-07-16 DIAGNOSIS — R252 Cramp and spasm: Secondary | ICD-10-CM

## 2017-07-16 DIAGNOSIS — M25551 Pain in right hip: Secondary | ICD-10-CM | POA: Diagnosis not present

## 2017-07-16 DIAGNOSIS — M6281 Muscle weakness (generalized): Secondary | ICD-10-CM

## 2017-07-16 DIAGNOSIS — M545 Low back pain: Secondary | ICD-10-CM

## 2017-07-16 DIAGNOSIS — G8929 Other chronic pain: Secondary | ICD-10-CM

## 2017-07-16 NOTE — Therapy (Addendum)
[place], [place], [address] Phone: [phone]   Fax:  [phone]  Physical Therapy Treatment / Discharge Summary  Patient Details  Name: Makeisha Jentsch MRN: [id number] Date of Birth: 03-24-1956 Referring Provider: [name], MD  Encounter Date: 07/16/2017      PT End of Session - 07/16/17 1345    Visit Number 8   Number of Visits 13   Date for PT Re-Evaluation 08/12/17   Authorization Type MCR: kx mod by 15th visit, progress note by 10th visit.    PT Start Time 1331   PT Stop Time 1401   PT Time Calculation (min) 30 min   Activity Tolerance Patient tolerated treatment well   Behavior During Therapy WFL for tasks assessed/performed      Past Medical History:  Diagnosis Date  . Arthritis   . Asthma   . Colonic polyp   . Obesity   . Pain in limb   . Smoker   . Swelling of limb     Past Surgical History:  Procedure Laterality Date  . COLONOSCOPY  2009   Dr. Henrene Pastor    There were no vitals filed for this visit.      Subjective Assessment - 07/16/17 1331    Subjective "so far im not doing too bad, I am trying to see if I like the heel lift in my shoe or not"   Currently in Pain? Yes   Pain Score 2    Pain Location Leg  thighs   Pain Orientation Right   Pain Descriptors / Indicators Sore            [facility] PT Assessment - 07/15/17 0949      Observation/Other Assessments   Focus on Therapeutic Outcomes (FOTO)  52% limited increaesed from (47% limited)     AROM   Lumbar Flexion 80   Lumbar Extension 20   Lumbar - Right Side Bend 12   Lumbar - Left Side Bend 18     Strength   Right Hip Flexion 4/5   Right Hip Extension 4-/5   Right Hip ABduction 4/5   Left Hip Flexion 4/5   Left Hip Extension 4/5   Left Hip ABduction 4/5   Left Hip ADduction 4/5                     [facility] Adult PT Treatment/Exercise - 07/16/17 1338      Lumbar Exercises:  Stretches   Single Knee to Chest Stretch 2 reps;30 seconds     Knee/Hip Exercises: Stretches   Active Hamstring Stretch 2 reps;30 seconds   Quad Stretch 2 reps;30 seconds     Knee/Hip Exercises: Aerobic   Nustep L6 x 7 min LE only  endurance training     Knee/Hip Exercises: Standing   Hip Abduction 2 sets;Knee straight;Both;Stengthening  with yellow theraband   Hip Extension 2 sets;10 reps  with yellow theraband, cues for proper form   Forward Step Up 2 sets;10 reps;Step Height: 6"     Knee/Hip Exercises: Seated   Sit to Sand 2 sets;10 reps;without UE support  with red theraband around knees for glute activation     Manual Therapy   Joint Mobilization long axis distraction of the R hip grade 5, posterior R hip mobs grade 4                PT Education - 07/15/17 1014    Education provided  Yes   Education Details benefits using heel lift for possible leg length issue on the LLE, and avoiding excessive hip popping/ jutting with weight shifting.    Person(s) Educated Patient   Methods Explanation;Verbal cues   Comprehension Verbalized understanding;Verbal cues required          PT Short Term Goals - 2017-07-18 1001      PT SHORT TERM GOAL #1   Title to be I with inital HEP (03/04/2017)   Time 3   Period Weeks   Status Achieved     PT SHORT TERM GOAL #2   Title to demo / verbalize proper posture and lifting/ carrying techniques to prevent and reduce low back/ hip pain (03/04/2017)   Time 3   Period Weeks   Status Achieved           PT Long Term Goals - 2017/07/18 1001      PT LONG TERM GOAL #1   Title pt will be I with all HEP given as of last visit ( 05/25/2017)   Time 6   Period Weeks   Status On-going     PT LONG TERM GOAL #2   Title increase bil hip strength to >/= 4/5 to promote stability and walking/ standing endurance (05/25/2017)   Time 6   Period Weeks   Status On-going     PT LONG TERM GOAL #3   Title be able to sit/ stand/ walk for >/= 30  min with </= 2/10 pain for functional endurance required for ADLs (05/25/2017)   Time 6   Period Weeks   Status On-going     PT LONG TERM GOAL #4   Title improve FOTO score to </= 47% limited to demo improvement in function (05/25/2017)   Time 6   Period Weeks   Status Partially Met               Plan - 07/16/17 1355    Clinical Impression Statement pt reports minimal soreness in the hip / low back with only soreness in the anterior thigh. focused on standing exercises for the hips to work on standing endurance. 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    PT Next Visit Plan update HEP PRN, hip mobs, hip/ core strengthenin modalites PRN, how is heel lift? posture with standing/ walking, avoiding excessive weight shifting   PT Home Exercise Plan supine marching, lower trunk rotation hip flexor stretch, hip abuction, posture/ biomechanics, sit to stand, standing marching, single knee to chest,    Consulted and Agree with Plan of Care Patient      Patient will benefit from skilled therapeutic intervention in order to improve the following deficits and impairments:  Abnormal gait, Pain, Improper body mechanics, Postural dysfunction, Decreased endurance, Decreased balance, Decreased activity tolerance, Decreased strength, Increased fascial restricitons, Decreased range of motion  Visit Diagnosis: Muscle weakness (generalized)  Cramp and spasm  Pain in right hip  Chronic right-sided low back pain, with sciatica presence unspecified       G-Codes - 07/18/2017 1015    Functional Assessment Tool Used (Outpatient Only) clinical judgement/ FOTO/ MMT   Functional Limitation Changing and maintaining body position   Changing and Maintaining Body Position Current Status (Q6834) At least 60 percent but less than 80 percent impaired, limited or restricted   Changing and Maintaining Body Position Goal Status (H9622) At least 40 percent but less than 60 percent impaired,  limited or restricted  Problem List Patient Active Problem List   Diagnosis Date Noted  . History of colonic polyps 03/06/2017  . Asthma, chronic, mild persistent, uncomplicated 72/08/1067  . Chronic seasonal allergic rhinitis due to pollen 03/06/2017  . Gastroesophageal reflux disease without esophagitis 10/10/2014  . Current smoker on some days 09/28/2013  . Type 2 diabetes mellitus with complications ([place]) 16/61/9694  . Obesity (BMI 30-39.9) 07/12/2012  . Chronic back pain 07/12/2012  . Hypertension associated with diabetes ([place]) 07/12/2012   Starr Lake PT, DPT, LAT, ATC  07/16/17  2:03 PM       [facility] [address] [place], [place], [address] Phone: [phone]   Fax:  [phone]  Name: Charlsey Moragne MRN: [id number] Date of Birth: 1956/05/14       PHYSICAL THERAPY DISCHARGE SUMMARY  Visits from Start of Care: 8  Current functional level related to goals / functional outcomes: See goals   Remaining deficits: unknown   Education / Equipment: HEP  Plan: Patient agrees to discharge.  Patient goals were partially met. Patient is being discharged due to not returning since the last visit.  ?????       Francisco Eyerly PT, DPT, LAT, ATC  08/26/17  9:53 AM

## 2017-07-28 ENCOUNTER — Ambulatory Visit: Payer: Medicare Other | Admitting: Physical Therapy

## 2017-07-29 ENCOUNTER — Encounter: Payer: Medicare Other | Admitting: Physical Therapy

## 2017-08-11 ENCOUNTER — Telehealth: Payer: Self-pay

## 2017-08-11 NOTE — Telephone Encounter (Signed)
LM for pt to CB to schedule appt for asthma management. Victorino December

## 2017-08-18 ENCOUNTER — Encounter: Payer: Self-pay | Admitting: Family Medicine

## 2017-08-18 ENCOUNTER — Ambulatory Visit (INDEPENDENT_AMBULATORY_CARE_PROVIDER_SITE_OTHER): Payer: Medicare Other | Admitting: Family Medicine

## 2017-08-18 VITALS — BP 130/80 | HR 72 | Resp 16 | Wt 221.8 lb

## 2017-08-18 DIAGNOSIS — F17200 Nicotine dependence, unspecified, uncomplicated: Secondary | ICD-10-CM | POA: Diagnosis not present

## 2017-08-18 DIAGNOSIS — J4530 Mild persistent asthma, uncomplicated: Secondary | ICD-10-CM

## 2017-08-18 MED ORDER — VARENICLINE TARTRATE 0.5 MG X 11 & 1 MG X 42 PO MISC: ORAL | 0 refills | Status: DC

## 2017-08-18 MED ORDER — FLUTICASONE PROPIONATE (INHAL) 50 MCG/BLIST IN AEPB: 1.0000 | INHALATION_SPRAY | Freq: Two times a day (BID) | RESPIRATORY_TRACT | 12 refills | Status: DC

## 2017-08-18 NOTE — Patient Instructions (Signed)
Use the Flovent daily if you need to use the rescue inhaler more than twice a week your not under control. Call for an appointment

## 2017-08-18 NOTE — Progress Notes (Signed)
   Subjective:    Patient ID: [name], female    DOB: [birth year], 61 y.o.   MRN: [id number]  HPI She is here for consult concerning her asthma and quitting smoking again. She did try using Chantix in the past but did not stick with it and had several gaps in between proper use of the medication. She also is using inhalers for her asthma however she is using the low back on an as-needed basis and also using albuterol on a nightly basis. She states that she can't member to use the inhaler.   Review of Systems     Objective:   Physical Exam alert and in no distress otherwise not examined      Assessment & Plan:  Current smoker on some days - Plan: varenicline (CHANTIX STARTING MONTH PAK) 0.5 MG X 11 & 1 MG X 42 tablet  Asthma, chronic, mild persistent, uncomplicated - Plan: fluticasone (FLOVENT DISKUS) 50 MCG/BLIST diskus inhaler  I discussed proper use of her inhalers. She is to use the Flovent on a regular basis and only use albuterol if needed. Discussed the fact that if she uses her albuterol more than twice per week during the day or twice per month at night, she should call.  I then discussed smoking cessation with her. She is now smoking only half a pack per day. Explained the fact that at this level most of her issues are probably habit. Discussed the need for her to look closely at her habits and come up with things to do instead of smoking. She will also start on the Chantix. Cautioned again on not running out of the medication and starting over again. She is to return here in one month. Over 25 minutes, the entire time spent in counseling and coordination of care.

## 2017-09-17 ENCOUNTER — Ambulatory Visit: Payer: Medicare Other | Admitting: Family Medicine

## 2017-09-25 ENCOUNTER — Ambulatory Visit (INDEPENDENT_AMBULATORY_CARE_PROVIDER_SITE_OTHER): Payer: Medicare Other | Admitting: Family Medicine

## 2017-09-25 ENCOUNTER — Encounter: Payer: Self-pay | Admitting: Family Medicine

## 2017-09-25 VITALS — BP 140/70 | HR 76 | Resp 18 | Ht 66.14 in | Wt 226.4 lb

## 2017-09-25 DIAGNOSIS — F17200 Nicotine dependence, unspecified, uncomplicated: Secondary | ICD-10-CM | POA: Diagnosis not present

## 2017-09-25 DIAGNOSIS — E118 Type 2 diabetes mellitus with unspecified complications: Secondary | ICD-10-CM | POA: Diagnosis not present

## 2017-09-25 LAB — POCT GLYCOSYLATED HEMOGLOBIN (HGB A1C): HEMOGLOBIN A1C: 6.5

## 2017-09-25 MED ORDER — VARENICLINE TARTRATE 1 MG PO TABS: 1.0000 mg | ORAL_TABLET | Freq: Two times a day (BID) | ORAL | 0 refills | Status: DC

## 2017-09-25 NOTE — Progress Notes (Signed)
   Subjective:    Patient ID: [name], female    DOB: 02/28/1956, 61 y.o.   MRN: [id number]  HPI She is here for a recheck. Stated purpose was to discuss her smoking. She did not mention diabetes at all. She tended to ramble making it difficult to get a good history from her. Apparently she is now down to 1 cigarette per day and does note that the Chantix make it taste bad. She then discussed difficulty with swallowing especially on the right and abdominal pain but could not get a good history as to when the pain occurs and what makes it better or makes it worse. She also stated she is having URI symptoms but these were getting better.   Review of Systems     Objective:   Physical Exam Alert and in no distress. Exam of the throat does show tonsillith that was partially removed manually with a tongue blade. Neck is supple without adenopathy. A1c was 6.5      Assessment & Plan:  Current smoker on some days - Plan: varenicline ([place] PAK) 1 MG tablet  Type 2 diabetes mellitus with complication, without long-term current use of insulin ([place]) - Plan: HgB A1c I discussed the fact that she is now down to 1 cigarette usually this occurs after eating. Recommend she get herself several options of things to do instead of smoking after she eats. It was difficult for her to stay on task. Although we did not talk about her diabetes her A1c was quite good. Since her URI symptoms are getting better no intervention needed there.

## 2017-11-04 ENCOUNTER — Other Ambulatory Visit: Payer: Self-pay | Admitting: Family Medicine

## 2017-11-04 DIAGNOSIS — F17200 Nicotine dependence, unspecified, uncomplicated: Secondary | ICD-10-CM

## 2017-11-04 NOTE — Telephone Encounter (Signed)
Is this okay to refill? 

## 2017-12-02 ENCOUNTER — Ambulatory Visit (INDEPENDENT_AMBULATORY_CARE_PROVIDER_SITE_OTHER): Payer: Medicare Other | Admitting: Family Medicine

## 2017-12-02 ENCOUNTER — Encounter: Payer: Self-pay | Admitting: Family Medicine

## 2017-12-02 VITALS — BP 134/84 | HR 65 | Ht 67.0 in | Wt 224.2 lb

## 2017-12-02 DIAGNOSIS — J4530 Mild persistent asthma, uncomplicated: Secondary | ICD-10-CM

## 2017-12-02 DIAGNOSIS — Z87891 Personal history of nicotine dependence: Secondary | ICD-10-CM

## 2017-12-02 MED ORDER — FLUTICASONE PROPIONATE HFA 110 MCG/ACT IN AERO: 1.0000 | INHALATION_SPRAY | Freq: Two times a day (BID) | RESPIRATORY_TRACT | 12 refills | Status: DC

## 2017-12-02 NOTE — Progress Notes (Signed)
   Subjective:    Patient ID: [name], female    DOB: 03-Nov-1956, 62 y.o.   MRN: [id number]  HPI She complains of a 2-week history of shortness of breath, PND and questionable fever and chills.  No sore throat, earache, rhinorrhea, sneezing, itchy watery eyes.  She has been on Chantix intermittently for the last 2 months and to stop approximately 1 week ago.  She remains cigarette free.  She has been using her Flovent twice per day and rescue inhaler roughly 4 times per week.   Review of Systems     Objective:   Physical Exam Alert and in no distress. Tympanic membranes and canals are normal. Pharyngeal area is normal. Neck is supple without adenopathy or thyromegaly. Cardiac exam shows a regular sinus rhythm without murmurs or gallops. Lungs are clear to auscultation.        Assessment & Plan:  Asthma, chronic, mild persistent, uncomplicated - Plan: fluticasone (FLOVENT HFA) 110 MCG/ACT inhaler  Ex-cigarette smoker I do not think her asthma is under good control and I will therefore increase her Flovent.  Recheck here in 2 weeks.  I congratulated her on quitting smoking.

## 2017-12-28 ENCOUNTER — Ambulatory Visit (INDEPENDENT_AMBULATORY_CARE_PROVIDER_SITE_OTHER): Payer: Medicare Other | Admitting: Family Medicine

## 2017-12-28 ENCOUNTER — Encounter: Payer: Self-pay | Admitting: Family Medicine

## 2017-12-28 ENCOUNTER — Ambulatory Visit: Payer: Medicare Other | Admitting: Family Medicine

## 2017-12-28 VITALS — BP 118/76 | HR 68 | Wt 226.8 lb

## 2017-12-28 DIAGNOSIS — J4530 Mild persistent asthma, uncomplicated: Secondary | ICD-10-CM

## 2017-12-28 NOTE — Progress Notes (Signed)
   Subjective:    Patient ID: [name], female    DOB: May 04, 1956, 62 y.o.   MRN: [id number]  HPI She is here for recheck.  She is now taking a higher dose of the Flovent and states that she is only had to use her rescue inhaler less than once per week.   Review of Systems     Objective:   Physical Exam Alert and in no distress.  Cardiac exam shows regular rhythm without murmurs or gallops.  Lungs are clear to auscultation.       Assessment & Plan:  Asthma, chronic, mild persistent, uncomplicated I explained that she is under good control as long as she does not have to use her rescue inhaler more than twice per week.  She is comfortable with this.

## 2018-02-08 ENCOUNTER — Telehealth: Payer: Self-pay | Admitting: Family Medicine

## 2018-02-08 NOTE — Telephone Encounter (Signed)
Pt called and is wanting to know if she can take a vitamin d 3 2000 IU  supplement OTC to help with her bones, she can be reached at [phone],

## 2018-02-09 NOTE — Telephone Encounter (Signed)
Left message for pt to call me back 

## 2018-02-09 NOTE — Telephone Encounter (Signed)
Yes

## 2018-02-09 NOTE — Telephone Encounter (Signed)
Pt was notified.  

## 2018-02-16 LAB — HEMOGLOBIN A1C: Hemoglobin A1C: 5.7

## 2018-02-19 ENCOUNTER — Telehealth: Payer: Self-pay | Admitting: Family Medicine

## 2018-02-19 NOTE — Telephone Encounter (Signed)
New Message  Received a fax from [facility] in regards to a non talking glucose meter and patient denies everything. Encounter Signed

## 2018-02-22 NOTE — Telephone Encounter (Signed)
Pt says she did not need the meter and her a1c is down to 5.7 Thanks [facility]

## 2018-02-22 NOTE — Telephone Encounter (Signed)
Called pt back but no answer. Thanks [employer]

## 2018-04-25 ENCOUNTER — Other Ambulatory Visit: Payer: Self-pay | Admitting: Family Medicine

## 2018-04-27 ENCOUNTER — Telehealth: Payer: Self-pay | Admitting: Family Medicine

## 2018-04-27 NOTE — Telephone Encounter (Signed)
Recv'd fax from [facility] stating insurance does not pay for Ventolin, will pay for Proair.  Called pharmacy and switched to [employer].

## 2018-04-27 NOTE — Telephone Encounter (Signed)
[name] is requesting to fill pt pro air Please advise [facility]

## 2018-05-06 ENCOUNTER — Other Ambulatory Visit: Payer: Self-pay | Admitting: Family Medicine

## 2018-05-06 DIAGNOSIS — I10 Essential (primary) hypertension: Principal | ICD-10-CM

## 2018-05-06 DIAGNOSIS — E1159 Type 2 diabetes mellitus with other circulatory complications: Secondary | ICD-10-CM

## 2018-05-06 DIAGNOSIS — I152 Hypertension secondary to endocrine disorders: Secondary | ICD-10-CM

## 2018-05-25 ENCOUNTER — Ambulatory Visit (INDEPENDENT_AMBULATORY_CARE_PROVIDER_SITE_OTHER): Payer: Medicare Other | Admitting: Family Medicine

## 2018-05-25 ENCOUNTER — Telehealth: Payer: Self-pay | Admitting: Internal Medicine

## 2018-05-25 ENCOUNTER — Encounter: Payer: Self-pay | Admitting: Family Medicine

## 2018-05-25 VITALS — BP 138/72 | HR 66 | Temp 98.3°F | Ht 65.0 in | Wt 221.4 lb

## 2018-05-25 DIAGNOSIS — Z87891 Personal history of nicotine dependence: Secondary | ICD-10-CM | POA: Diagnosis not present

## 2018-05-25 DIAGNOSIS — E118 Type 2 diabetes mellitus with unspecified complications: Secondary | ICD-10-CM | POA: Diagnosis not present

## 2018-05-25 DIAGNOSIS — Z86010 Personal history of colonic polyps: Secondary | ICD-10-CM | POA: Diagnosis not present

## 2018-05-25 DIAGNOSIS — M545 Low back pain: Secondary | ICD-10-CM

## 2018-05-25 DIAGNOSIS — G8929 Other chronic pain: Secondary | ICD-10-CM

## 2018-05-25 DIAGNOSIS — K219 Gastro-esophageal reflux disease without esophagitis: Secondary | ICD-10-CM

## 2018-05-25 DIAGNOSIS — J4530 Mild persistent asthma, uncomplicated: Secondary | ICD-10-CM | POA: Diagnosis not present

## 2018-05-25 DIAGNOSIS — J301 Allergic rhinitis due to pollen: Secondary | ICD-10-CM

## 2018-05-25 DIAGNOSIS — I10 Essential (primary) hypertension: Secondary | ICD-10-CM | POA: Diagnosis not present

## 2018-05-25 DIAGNOSIS — D126 Benign neoplasm of colon, unspecified: Secondary | ICD-10-CM

## 2018-05-25 DIAGNOSIS — E1159 Type 2 diabetes mellitus with other circulatory complications: Secondary | ICD-10-CM

## 2018-05-25 DIAGNOSIS — E669 Obesity, unspecified: Secondary | ICD-10-CM | POA: Diagnosis not present

## 2018-05-25 DIAGNOSIS — I152 Hypertension secondary to endocrine disorders: Secondary | ICD-10-CM

## 2018-05-25 LAB — CBC WITH DIFFERENTIAL/PLATELET
Basophils Absolute: 0.1 10*3/uL (ref 0.0–0.2)
Basos: 1 %
EOS (ABSOLUTE): 0.1 10*3/uL (ref 0.0–0.4)
Eos: 2 %
Hematocrit: 35.4 % (ref 34.0–46.6)
Hemoglobin: 12.2 g/dL (ref 11.1–15.9)
Immature Grans (Abs): 0 10*3/uL (ref 0.0–0.1)
Immature Granulocytes: 0 %
Lymphocytes Absolute: 1.7 10*3/uL (ref 0.7–3.1)
Lymphs: 25 %
MCH: 30.4 pg (ref 26.6–33.0)
MCHC: 34.5 g/dL (ref 31.5–35.7)
MCV: 88 fL (ref 79–97)
Monocytes Absolute: 0.6 10*3/uL (ref 0.1–0.9)
Monocytes: 8 %
Neutrophils Absolute: 4.5 10*3/uL (ref 1.4–7.0)
Neutrophils: 64 %
Platelets: 378 10*3/uL (ref 150–450)
RBC: 4.01 x10E6/uL (ref 3.77–5.28)
RDW: 14.1 % (ref 12.3–15.4)
WBC: 7.1 10*3/uL (ref 3.4–10.8)

## 2018-05-25 LAB — COMPREHENSIVE METABOLIC PANEL
A/G RATIO: 1.7 (ref 1.2–2.2)
ALK PHOS: 53 IU/L (ref 39–117)
ALT: 20 IU/L (ref 0–32)
AST: 16 IU/L (ref 0–40)
Albumin: 4.5 g/dL (ref 3.6–4.8)
BUN / CREAT RATIO: 19 (ref 12–28)
BUN: 23 mg/dL (ref 8–27)
Bilirubin Total: 0.4 mg/dL (ref 0.0–1.2)
CO2: 22 mmol/L (ref 20–29)
CREATININE: 1.23 mg/dL — AB (ref 0.57–1.00)
Calcium: 9.6 mg/dL (ref 8.7–10.3)
Chloride: 103 mmol/L (ref 96–106)
GFR calc Af Amer: 54 mL/min/{1.73_m2} — ABNORMAL LOW (ref >59)
GFR, EST NON AFRICAN AMERICAN: 47 mL/min/{1.73_m2} — AB (ref >59)
GLOBULIN, TOTAL: 2.7 g/dL (ref 1.5–4.5)
Glucose: 119 mg/dL — ABNORMAL HIGH (ref 65–99)
Potassium: 4.3 mmol/L (ref 3.5–5.2)
SODIUM: 138 mmol/L (ref 134–144)
Total Protein: 7.2 g/dL (ref 6.0–8.5)

## 2018-05-25 LAB — LIPID PANEL
CHOL/HDL RATIO: 2.8 ratio (ref 0.0–4.4)
CHOLESTEROL TOTAL: 155 mg/dL (ref 100–199)
HDL: 55 mg/dL (ref >39)
LDL CALC: 84 mg/dL (ref 0–99)
Triglycerides: 81 mg/dL (ref 0–149)
VLDL Cholesterol Cal: 16 mg/dL (ref 5–40)

## 2018-05-25 MED ORDER — FLUTICASONE PROPIONATE HFA 220 MCG/ACT IN AERO: 2.0000 | INHALATION_SPRAY | Freq: Two times a day (BID) | RESPIRATORY_TRACT | 12 refills | Status: DC

## 2018-05-25 MED ORDER — ATORVASTATIN CALCIUM 20 MG PO TABS: 20.0000 mg | ORAL_TABLET | Freq: Every day | ORAL | 3 refills | Status: DC

## 2018-05-25 NOTE — Telephone Encounter (Signed)
I spoke with the patient and scheduled her colonoscopy for 07-01-18 at 4:00 pm and pre-op on 06-21-18 at 11:00 am.  Need to follow prep instructions for procedure note 01-23-16.

## 2018-05-25 NOTE — Progress Notes (Signed)
[name] is a 61 y.o. female who presents for annual wellness visit and follow-up on chronic medical conditions.  She has the following concerns: She is very proud of the fact that she quit smoking in October of last year.  She is still having difficulty with her asthma and her present dosing of Flovent still requires her to use a rescue inhaler on a daily basis.  Any physical activity does increase her shortness of breath but she does not complain of chest pain, PND or orthopnea.  She does have underlying allergies and they seem to be under good control.  She does complain of back pain as well as leg cramping but usually this goes away with getting up and being more physically active.  She does have underlying diabetes but presently is not on medications other than for her blood pressure.  She did have an A1c in the past greater than 6.5.  She does have occasional difficulty with reflux but presently is not on any medications.  Review of record indicates she had an adequate prep with her last colonoscopy and this will need to be redone.  Immunizations and Health Maintenance Immunization History  Administered Date(s) Administered  . Hepatitis B 08/13/2012, 09/10/2012  . Hepatitis B, adult 09/28/2013  . PPD Test 08/08/2014, 09/10/2015, 09/01/2016  . Pneumococcal Conjugate-13 03/06/2017  . Pneumococcal Polysaccharide-23 08/13/2012  . Tdap 08/13/2012  . Zoster 10/10/2014  . Zoster Recombinat (Shingrix) 01/12/2017, 07/18/2017   Health Maintenance Due  Topic Date Due  . Hepatitis C Screening  Dec 05, 1955  . FOOT EXAM  06/11/1964  . OPHTHALMOLOGY EXAM  06/11/1964  . HIV Screening  06/11/1969  . COLONOSCOPY  01/22/2017  . PNEUMOCOCCAL POLYSACCHARIDE VACCINE (2) 08/13/2017  . MAMMOGRAM  11/22/2017  . HEMOGLOBIN A1C  03/26/2018    Last Pap smear: over three Last mammogram: two years ago Last colonoscopy: last year Last DEXA: never had one  Dentist: q every six months  Ophtho: this  year Exercise: walking   Other doctors caring for patient include:[place] ,Henrene Pastor  Advanced directives: Does Patient Have a Medical Advance Directive?: No Would patient like information on creating a medical advance directive?: Yes (MAU/Ambulatory/Procedural Areas - Information given)  Depression screen:  See questionnaire below.  Depression screen [facility] 2/9 05/25/2018 08/18/2017 09/28/2013  Decreased Interest 0 0 0  Down, Depressed, Hopeless 0 0 0  PHQ - 2 Score 0 0 0    Fall Risk Screen: see questionnaire below. Fall Risk  05/25/2018 09/28/2013  Falls in the past year? No No    ADL screen:  See questionnaire below Functional Status Survey: Is the patient deaf or have difficulty hearing?: No Does the patient have difficulty seeing, even when wearing glasses/contacts?: No Does the patient have difficulty concentrating, remembering, or making decisions?: No Does the patient have difficulty walking or climbing stairs?: Yes Does the patient have difficulty dressing or bathing?: No Does the patient have difficulty doing errands alone such as visiting a doctor's office or shopping?: No   Review of Systems Constitutional: -, -unexpected weight change, -anorexia, -fatigue Allergy: -sneezing, -itching, -congestion Dermatology: denies changing moles, rash, lumps ENT: -runny nose, -ear pain, -sore throat,  Cardiology:  -chest pain, -palpitations, -orthopnea, Respiratory: -cough, -shortness of breath, -dyspnea on exertion, -wheezing,  Gastroenterology: -abdominal pain, -nausea, -vomiting, -diarrhea, -constipation, -dysphagia Hematology: -bleeding or bruising problems Musculoskeletal: -arthralgias, -myalgias, -joint swelling, -back pain, - Ophthalmology: -vision changes,  Urology: -dysuria, -difficulty urinating,  -urinary frequency, -urgency, incontinence Neurology: -, -numbness, , -  memory loss, -falls, -dizziness    PHYSICAL EXAM:  BP 138/72 (BP Location: Left Arm, Patient Position:  Sitting)   Pulse 66   Temp 98.3 F (36.8 C)   Ht 5\' 5"  (1.651 m)   Wt 221 lb 6.4 oz (100.4 kg)   SpO2 97%   BMI 36.84 kg/m   General Appearance: Alert, cooperative, no distress, appears stated age Head: Normocephalic, without obvious abnormality, atraumatic Eyes: PERRL, conjunctiva/corneas clear, EOM's intact, fundi benign Ears: Normal TM's and external ear canals Nose: Nares normal, mucosa normal, no drainage or sinus tenderness Throat: Lips, mucosa, and tongue normal; teeth and gums normal Neck: Supple, no lymphadenopathy;  thyroid:  no enlargement/tenderness/nodules; no carotid bruit or JVD Lungs: Clear to auscultation bilaterally without wheezes, rales or ronchi; respirations unlabored Heart: Regular rate and rhythm, S1 and S2 normal, no murmur, rubor gallop Abdomen: Soft, non-tender, nondistended, normoactive bowel sounds,  no masses, no hepatosplenomegaly Extremities: No clubbing, cyanosis or edema Pulses: 2+ and symmetric all extremities Skin:  Skin color, texture, turgor normal, no rashes or lesions Lymph nodes: Cervical, supraclavicular, and axillary nodes normal Neurologic:  CNII-XII intact, normal strength, sensation and gait; reflexes 2+ and symmetric throughout Psych: Normal mood, affect, hygiene and grooming. A1c is 5.7 ASSESSMENT/PLAN: Type 2 diabetes mellitus with complication, without long-term current use of insulin (HCC) - Plan: CBC with Differential/Platelet, Comprehensive metabolic panel, Lipid panel  Chronic low back pain, unspecified back pain laterality, with sciatica presence unspecified  Asthma, chronic, mild persistent, uncomplicated - Plan: fluticasone (FLOVENT HFA) 220 MCG/ACT inhaler  Chronic seasonal allergic rhinitis due to pollen  Ex-cigarette smoker  Gastroesophageal reflux disease without esophagitis  History of colonic polyps - Plan: Ambulatory referral to Gastroenterology  Hypertension associated with diabetes ([place]) - Plan: CBC with  Differential/Platelet, Comprehensive metabolic panel  Obesity (BMI 30-39.9) - Plan: CBC with Differential/Platelet, Comprehensive metabolic panel, Lipid panel  Adenomatous polyp of colon, unspecified part of colon She will be rescheduled for repeat colonoscopy.  I will place her on a statin drug.  Continue on her other present medications.  Increase her Flovent and have her return here in 1 month for follow-up.  Discussed the use of the rescue inhaler with her.  Also recommended adding Tylenol for the leg cramps that she has.  Complemented her on the smoking cessation.  Recommended continued conservative care for her reflux.  Medicare Attestation I have personally reviewed: The patient's medical and social history Their use of alcohol, tobacco or illicit drugs Their current medications and supplements The patient's functional ability including ADLs,fall risks, home safety risks, cognitive, and hearing and visual impairment Diet and physical activities Evidence for depression or mood disorders  The patient's weight, height, and BMI have been recorded in the chart.  I have made referrals, counseling, and provided education to the patient based on review of the above and I have provided the patient with a written personalized care plan for preventive services.     Jill Alexanders, MD   05/25/2018

## 2018-06-21 ENCOUNTER — Other Ambulatory Visit: Payer: Self-pay

## 2018-06-21 ENCOUNTER — Ambulatory Visit (AMBULATORY_SURGERY_CENTER): Payer: Self-pay

## 2018-06-21 VITALS — Ht 67.0 in | Wt 217.2 lb

## 2018-06-21 DIAGNOSIS — Z86010 Personal history of colonic polyps: Secondary | ICD-10-CM

## 2018-06-21 MED ORDER — NA SULFATE-K SULFATE-MG SULF 17.5-3.13-1.6 GM/177ML PO SOLN: 1.0000 | Freq: Once | ORAL | 0 refills | Status: AC

## 2018-06-21 NOTE — Progress Notes (Signed)
No egg or soy allergy known to patient  No issues with past sedation with any surgeries  or procedures, no intubation problems  No diet pills per patient No home 02 use per patient  No blood thinners per patient  Pt denies issues with constipation  No A fib or A flutter  EMMI video sent to pt's e mail , pt declined    

## 2018-06-23 ENCOUNTER — Encounter: Payer: Self-pay | Admitting: *Deleted

## 2018-06-25 ENCOUNTER — Emergency Department (HOSPITAL_COMMUNITY)
Admission: EM | Admit: 2018-06-25 | Discharge: 2018-06-25 | Disposition: A | Discharge status: Home / Self Care | Payer: Medicare Other | Attending: Emergency Medicine | Admitting: Emergency Medicine

## 2018-06-25 ENCOUNTER — Other Ambulatory Visit: Payer: Self-pay

## 2018-06-25 DIAGNOSIS — J45909 Unspecified asthma, uncomplicated: Secondary | ICD-10-CM | POA: Insufficient documentation

## 2018-06-25 DIAGNOSIS — Z7982 Long term (current) use of aspirin: Secondary | ICD-10-CM | POA: Diagnosis not present

## 2018-06-25 DIAGNOSIS — Z87891 Personal history of nicotine dependence: Secondary | ICD-10-CM | POA: Insufficient documentation

## 2018-06-25 DIAGNOSIS — R197 Diarrhea, unspecified: Secondary | ICD-10-CM | POA: Diagnosis not present

## 2018-06-25 DIAGNOSIS — E860 Dehydration: Secondary | ICD-10-CM | POA: Diagnosis not present

## 2018-06-25 DIAGNOSIS — E119 Type 2 diabetes mellitus without complications: Secondary | ICD-10-CM | POA: Insufficient documentation

## 2018-06-25 DIAGNOSIS — I10 Essential (primary) hypertension: Secondary | ICD-10-CM | POA: Insufficient documentation

## 2018-06-25 DIAGNOSIS — R42 Dizziness and giddiness: Secondary | ICD-10-CM | POA: Diagnosis not present

## 2018-06-25 DIAGNOSIS — R110 Nausea: Secondary | ICD-10-CM | POA: Diagnosis not present

## 2018-06-25 DIAGNOSIS — R112 Nausea with vomiting, unspecified: Secondary | ICD-10-CM | POA: Diagnosis not present

## 2018-06-25 LAB — COMPREHENSIVE METABOLIC PANEL
ALK PHOS: 46 U/L (ref 38–126)
ALT: 21 U/L (ref 0–44)
AST: 24 U/L (ref 15–41)
Albumin: 3.6 g/dL (ref 3.5–5.0)
Anion gap: 7 (ref 5–15)
BILIRUBIN TOTAL: 0.6 mg/dL (ref 0.3–1.2)
BUN: 22 mg/dL (ref 8–23)
CALCIUM: 9 mg/dL (ref 8.9–10.3)
CO2: 25 mmol/L (ref 22–32)
CREATININE: 1.16 mg/dL — AB (ref 0.44–1.00)
Chloride: 109 mmol/L (ref 98–111)
GFR, EST AFRICAN AMERICAN: 56 mL/min — AB (ref >60)
GFR, EST NON AFRICAN AMERICAN: 49 mL/min — AB (ref >60)
Glucose, Bld: 154 mg/dL — ABNORMAL HIGH (ref 70–99)
Potassium: 4.3 mmol/L (ref 3.5–5.1)
SODIUM: 141 mmol/L (ref 135–145)
TOTAL PROTEIN: 6.9 g/dL (ref 6.5–8.1)

## 2018-06-25 LAB — CBC WITH DIFFERENTIAL/PLATELET
Abs Immature Granulocytes: 0 10*3/uL (ref 0.0–0.1)
Basophils Absolute: 0.1 10*3/uL (ref 0.0–0.1)
Basophils Relative: 1 %
EOS ABS: 0 10*3/uL (ref 0.0–0.7)
Eosinophils Relative: 0 %
HEMATOCRIT: 37.6 % (ref 36.0–46.0)
HEMOGLOBIN: 12 g/dL (ref 12.0–15.0)
IMMATURE GRANULOCYTES: 0 %
LYMPHS ABS: 1 10*3/uL (ref 0.7–4.0)
LYMPHS PCT: 9 %
MCH: 30.2 pg (ref 26.0–34.0)
MCHC: 31.9 g/dL (ref 30.0–36.0)
MCV: 94.5 fL (ref 78.0–100.0)
MONOS PCT: 4 %
Monocytes Absolute: 0.5 10*3/uL (ref 0.1–1.0)
NEUTROS PCT: 86 %
Neutro Abs: 9.4 10*3/uL — ABNORMAL HIGH (ref 1.7–7.7)
Platelets: 319 10*3/uL (ref 150–400)
RBC: 3.98 MIL/uL (ref 3.87–5.11)
RDW: 12.7 % (ref 11.5–15.5)
WBC: 11 10*3/uL — ABNORMAL HIGH (ref 4.0–10.5)

## 2018-06-25 LAB — URINALYSIS, ROUTINE W REFLEX MICROSCOPIC
Bilirubin Urine: NEGATIVE
Glucose, UA: NEGATIVE mg/dL
Ketones, ur: NEGATIVE mg/dL
Leukocytes, UA: NEGATIVE
NITRITE: POSITIVE — AB
PH: 5 (ref 5.0–8.0)
Protein, ur: NEGATIVE mg/dL
SPECIFIC GRAVITY, URINE: 1.018 (ref 1.005–1.030)

## 2018-06-25 LAB — LIPASE, BLOOD: Lipase: 39 U/L (ref 11–51)

## 2018-06-25 MED ORDER — SODIUM CHLORIDE 0.9 % IV BOLUS
1000.0000 mL | Freq: Once | INTRAVENOUS | Status: AC
  Administered 2018-06-25: 1000 mL via INTRAVENOUS

## 2018-06-25 NOTE — Discharge Instructions (Addendum)
Return to ED for worsening symptoms, severe abdominal pain or chest pain, lightheadedness, loss of consciousness, blood in your stool or vomiting up blood.

## 2018-06-25 NOTE — ED Provider Notes (Signed)
[facility] EMERGENCY DEPARTMENT Provider Note   CSN: [id number] Arrival date & time: 06/25/18  1812     History   Chief Complaint Chief Complaint  Patient presents with  . Nausea    HPI [name] is a 62 y.o. female with a past medical history of hypertension, GERD, who presents to ED for evaluation of nausea, vomiting, frequent bowel movements, dizziness that began earlier today.  States that she woke up this morning and when she suddenly turned her head she began feeling nauseous and had one episode of nonbloody, nonbilious emesis.  Since then, she states any time she has tried to eat or drink anything she has vomiting.  States that the vomiting is causing her to defecate due to the pressure.  She denies any loss of her bowel or bladder function, diarrhea, blood in her stool.  She denies any loss of consciousness.  She feels that she got dizzy/lightheaded from all the vomiting that she has had.  Denies any abdominal pain.  She had one episode where she vomited up "a chunk of something red, but I think it was something that I ate."  Reports eating a baked potato from [facility] and several meals from fast food restaurants yesterday.  No sick contacts with similar symptoms.  Denies any chronic NSAID use, chest pain, shortness of breath, headache, vision changes, numbness in arms or legs, dysuria.  HPI  Past Medical History:  Diagnosis Date  . Arthritis   . Asthma   . Colonic polyp   . GERD (gastroesophageal reflux disease)   . Hypertension   . Obesity   . Pain in limb   . Smoker   . Swelling of limb     Patient Active Problem List   Diagnosis Date Noted  . Adenomatous polyp of colon 05/25/2018  . History of colonic polyps 03/06/2017  . Asthma, chronic, mild persistent, uncomplicated 30/08/2329  . Chronic seasonal allergic rhinitis due to pollen 03/06/2017  . Gastroesophageal reflux disease without esophagitis 10/10/2014  . Ex-cigarette smoker  09/28/2013  . Type 2 diabetes mellitus with complications ([place]) 07/62/2633  . Obesity (BMI 30-39.9) 07/12/2012  . Chronic back pain 07/12/2012  . Hypertension associated with diabetes ([place]) 07/12/2012    Past Surgical History:  Procedure Laterality Date  . COLONOSCOPY  2009   Dr. Henrene Pastor  . POLYPECTOMY       OB History   None      Home Medications    Prior to Admission medications   Medication Sig Start Date End Date Taking? Authorizing Provider  ACCU-CHEK AVIVA PLUS test strip USE ONE STRIP TO CHECK GLUCOSE ONCE TO TWICE DAILY 03/02/17   [name], MD  albuterol (PROVENTIL HFA;VENTOLIN HFA) 108 (90 Base) MCG/ACT inhaler INHALE 2 PUFFS BY MOUTH TWICE DAILY 04/27/18   [name], MD  albuterol (PROVENTIL) (2.5 MG/3ML) 0.083% nebulizer solution Take 3 mLs (2.5 mg total) by nebulization every 4 (four) hours as needed for wheezing or shortness of breath. 03/06/17   [name], MD  Aspirin-Salicylamide-Caffeine (BC HEADACHE POWDER PO) Take 1 packet by mouth as needed. Reported on 01/09/2016    [provider]  atorvastatin (LIPITOR) 20 MG tablet Take 1 tablet (20 mg total) by mouth daily. 05/25/18 05/25/19  [name], MD  Blood Glucose Monitoring Suppl (ACCU-CHEK AVIVA PLUS) W/DEVICE KIT USE AS DIRECTED TO CHECK BLOOD GLUCOSE 1 TO 2 TIMES DAILY 08/10/15   [name], MD  fluticasone ([place]  HFA) 220 MCG/ACT inhaler Inhale 2 puffs into the lungs 2 (two) times daily. 05/25/18   [name], MD  ibuprofen (ADVIL,MOTRIN) 800 MG tablet TAKE ONE TABLET BY MOUTH EVERY 8 HOURS AS NEEDED 11/04/17   [name], MD  lisinopril-hydrochlorothiazide [facility]) 20-12.5 MG tablet TAKE 1 TABLET BY MOUTH DAILY 05/06/18   [name], MD    Family History Family History  Problem Relation Age of Onset  . Lung cancer Father   . Cirrhosis Mother   . Cirrhosis Brother   . Colon cancer Neg Hx   . Colon polyps Neg Hx   . Esophageal cancer Neg Hx   . Rectal  cancer Neg Hx   . Stomach cancer Neg Hx     Social History Social History   Tobacco Use  . Smoking status: Former Smoker    Packs/day: 0.25    Years: 10.00    Pack years: 2.50    Types: Cigarettes  . Smokeless tobacco: Never Used  . Tobacco comment: quit oct 2018  Substance Use Topics  . Alcohol use: Yes    Comment: socially  . Drug use: Yes    Types: Marijuana    Comment: social     Allergies   Patient has no known allergies.   Review of Systems Review of Systems  Constitutional: Negative for appetite change, chills and fever.  HENT: Negative for ear pain, rhinorrhea, sneezing and sore throat.   Eyes: Negative for photophobia and visual disturbance.  Respiratory: Negative for cough, chest tightness, shortness of breath and wheezing.   Cardiovascular: Negative for chest pain and palpitations.  Gastrointestinal: Positive for nausea and vomiting. Negative for abdominal pain, blood in stool, constipation and diarrhea.  Genitourinary: Negative for dysuria, hematuria and urgency.  Musculoskeletal: Negative for myalgias.  Skin: Negative for rash.  Neurological: Positive for dizziness and light-headedness. Negative for weakness.     Physical Exam Updated Vital Signs BP (!) 146/53   Pulse 65   Temp 98 F (36.7 C) (Oral)   Resp 12   Ht 5' 7" (1.702 m)   Wt 97.1 kg (214 lb)   SpO2 96%   BMI 33.52 kg/m   Physical Exam  Constitutional: She appears well-developed and well-nourished. No distress.  HENT:  Head: Normocephalic and atraumatic.  Nose: Nose normal.  Eyes: Conjunctivae and EOM are normal. Right eye exhibits no discharge. Left eye exhibits no discharge. No scleral icterus.  Neck: Normal range of motion. Neck supple.  Cardiovascular: Normal rate, regular rhythm, normal heart sounds and intact distal pulses. Exam reveals no gallop and no friction rub.  No murmur heard. Pulmonary/Chest: Effort normal and breath sounds normal. No respiratory distress.    Abdominal: Soft. Bowel sounds are normal. She exhibits no distension. There is no tenderness. There is no guarding.  No abdominal tenderness to palpation.  Musculoskeletal: Normal range of motion. She exhibits no edema.  Neurological: She is alert. She exhibits normal muscle tone. Coordination normal.  Skin: Skin is warm and dry. No rash noted.  Psychiatric: She has a normal mood and affect.  Nursing note and vitals reviewed.    ED Treatments / Results  Labs (all labs ordered are listed, but only abnormal results are displayed) Labs Reviewed  COMPREHENSIVE METABOLIC PANEL - Abnormal; Notable for the following components:      Result Value   Glucose, Bld 154 (*)    Creatinine, Ser 1.16 (*)    GFR calc non Af Amer 49 (*)  GFR calc Af Amer 56 (*)    All other components within normal limits  URINALYSIS, ROUTINE W REFLEX MICROSCOPIC - Abnormal; Notable for the following components:   APPearance HAZY (*)    Hgb urine dipstick LARGE (*)    Nitrite POSITIVE (*)    Bacteria, UA MANY (*)    All other components within normal limits  CBC WITH DIFFERENTIAL/PLATELET - Abnormal; Notable for the following components:   WBC 11.0 (*)    Neutro Abs 9.4 (*)    All other components within normal limits  LIPASE, BLOOD    EKG None  Radiology No results found.  Procedures Procedures (including critical care time)  Medications Ordered in ED Medications  sodium chloride 0.9 % bolus 1,000 mL (1,000 mLs Intravenous New Bag/Given 06/25/18 1905)     Initial Impression / Assessment and Plan / ED Course  I have reviewed the triage vital signs and the nursing notes.  Pertinent labs & imaging results that were available during my care of the patient were reviewed by me and considered in my medical decision making (see chart for details).     62 year old female with a past medical history of hypertension, GERD presents to ED for evaluation of nausea, vomiting, frequent bowel movements and  dizziness that began earlier today.  Felt nauseous when she turned her head suddenly to the side after she woke up.  She does report vomiting up "a chunk of something red, but I think it was something that I ate."  She does report several fast food meals yesterday but she states could be the cause of her symptoms.  On physical exam she is overall well-appearing.  No abdominal tenderness to palpation.  She is afebrile.  Lab work shows mild elevation in creatinine to 1.16 and mild leukocytosis at 11.  Lipase unremarkable.  Urinalysis shows bacteria and nitrite positive but patient denies any urinary symptoms.  I spoke to the patient regarding her lab work.  She feels as though her symptoms have improved with the Zofran given through her IV in the ambulance.  She is given fluids here with significant improvement in her symptoms as well. She declines abdominal imaging at this time, which I feel is reasonable.  She is able to tolerate p.o. intake without difficulty prior to discharge.  Suspect that her symptoms are viral in nature.  Advised her to follow-up with her PCP and to return to ED for any severe worsening symptoms.  Portions of this note were generated with [employer]obby[employer]. Dictation errors may occur despite best attempts at proofreading.   Final Clinical Impressions(s) / ED Diagnoses   Final diagnoses:  Dehydration    ED Discharge Orders    None       Delia Heady, PA-C 06/25/18 [id number]    Duffy Bruce, MD 06/25/18 2357

## 2018-06-25 NOTE — ED Triage Notes (Signed)
Pt arrives from home with n/v/d and dizziness on standing starting this AM; took tumeric and immetrol PTA, given 4 zofran by [facility], relieved dry heaving  Unable to keep anything down, endorses 5 episodes of diarrhea today and one episode of vomiting blood.  [facility] vitals: 134/74; 72; 96% 18 LAC  Unable to take home meds today

## 2018-06-29 ENCOUNTER — Telehealth: Payer: Self-pay

## 2018-06-29 NOTE — Telephone Encounter (Signed)
Pt called stating she is having a procedure on Thursday and she starts the prep on Wednesday. She stated that starting today she has to be on a liquid diet. She was rushed to the hospital last Friday for vomiting and diarrhea. She stated she was given an IV, but now she has a lot of gas and stomach bubbling and burping. She wants to know what can she take for this that won't interfere with her having her Colonoscopy done. Please advise.

## 2018-06-29 NOTE — Telephone Encounter (Signed)
She can try Gas-X to help with bubbling and gas

## 2018-06-30 NOTE — Telephone Encounter (Signed)
Pt has been informed of providers response.

## 2018-07-01 ENCOUNTER — Ambulatory Visit (AMBULATORY_SURGERY_CENTER): Payer: Medicare Other | Admitting: Internal Medicine

## 2018-07-01 ENCOUNTER — Encounter: Payer: Self-pay | Admitting: Internal Medicine

## 2018-07-01 ENCOUNTER — Telehealth: Payer: Self-pay | Admitting: Internal Medicine

## 2018-07-01 VITALS — BP 105/60 | HR 68 | Temp 98.6°F | Resp 20 | Ht 67.0 in | Wt 214.0 lb

## 2018-07-01 DIAGNOSIS — D123 Benign neoplasm of transverse colon: Secondary | ICD-10-CM

## 2018-07-01 DIAGNOSIS — D125 Benign neoplasm of sigmoid colon: Secondary | ICD-10-CM | POA: Diagnosis not present

## 2018-07-01 DIAGNOSIS — Z1211 Encounter for screening for malignant neoplasm of colon: Secondary | ICD-10-CM | POA: Diagnosis not present

## 2018-07-01 DIAGNOSIS — Z86010 Personal history of colonic polyps: Secondary | ICD-10-CM | POA: Diagnosis not present

## 2018-07-01 MED ORDER — SODIUM CHLORIDE 0.9 % IV SOLN: 500.0000 mL | Freq: Once | INTRAVENOUS | Status: DC

## 2018-07-01 NOTE — Patient Instructions (Signed)
YOU HAD AN ENDOSCOPIC PROCEDURE TODAY AT THE [facility]:   Refer to the procedure report that was given to you for any specific questions about what was found during the examination.  If the procedure report does not answer your questions, please call your gastroenterologist to clarify.  If you requested that your care partner not be given the details of your procedure findings, then the procedure report has been included in a sealed envelope for you to review at your convenience later.  YOU SHOULD EXPECT: Some feelings of bloating in the abdomen. Passage of more gas than usual.  Walking can help get rid of the air that was put into your GI tract during the procedure and reduce the bloating. If you had a lower endoscopy (such as a colonoscopy or flexible sigmoidoscopy) you may notice spotting of blood in your stool or on the toilet paper. If you underwent a bowel prep for your procedure, you may not have a normal bowel movement for a few days.  Please Note:  You might notice some irritation and congestion in your nose or some drainage.  This is from the oxygen used during your procedure.  There is no need for concern and it should clear up in a day or so.  SYMPTOMS TO REPORT IMMEDIATELY:   Following lower endoscopy (colonoscopy or flexible sigmoidoscopy):  Excessive amounts of blood in the stool  Significant tenderness or worsening of abdominal pains  Swelling of the abdomen that is new, acute  Fever of 100F or higher  For urgent or emergent issues, a gastroenterologist can be reached at any hour by calling [phone].   DIET:  We do recommend a small meal at first, but then you may proceed to your regular diet.  Drink plenty of fluids but you should avoid alcoholic beverages for 24 hours.  ACTIVITY:  You should plan to take it easy for the rest of today and you should NOT DRIVE or use heavy machinery until tomorrow (because of the sedation medicines used during the test).     FOLLOW UP: Our staff will call the number listed on your records the next business day following your procedure to check on you and address any questions or concerns that you may have regarding the information given to you following your procedure. If we do not reach you, we will leave a message.  However, if you are feeling well and you are not experiencing any problems, there is no need to return our call.  We will assume that you have returned to your regular daily activities without incident.  If any biopsies were taken you will be contacted by phone or by letter within the next 1-3 weeks.  Please call us at [phone] if you have not heard about the biopsies in 3 weeks.   Await for biopsy results Polyps (handout given) Hemorrhoids (handout given) Diverticulosis (handout given)  SIGNATURES/CONFIDENTIALITY: You and/or your care partner have signed paperwork which will be entered into your electronic medical record.  These signatures attest to the fact that that the information above on your After Visit Summary has been reviewed and is understood.  Full responsibility of the confidentiality of this discharge information lies with you and/or your care-partner. 

## 2018-07-01 NOTE — Progress Notes (Signed)
Pt's states no medical or surgical changes since previsit or office visit. 

## 2018-07-01 NOTE — Progress Notes (Signed)
Called to room to assist during endoscopic procedure.  Patient ID and intended procedure confirmed with present staff. Received instructions for my participation in the procedure from the performing physician.  

## 2018-07-01 NOTE — Op Note (Signed)
[facility] Patient Name: Shaquel Josephson Procedure Date: 07/01/2018 3:28 PM MRN: [id number] Endoscopist: Docia Chuck. Henrene Pastor , MD Age: 62 Referring MD:  Date of Birth: [birth year] Gender: Female Account #: [id number] Procedure:                Colonoscopy, With cold snare polypectomy x 5 Indications:              High risk colon cancer surveillance: Personal                            history of adenoma (10 mm or greater in size), High                            risk colon cancer surveillance: Personal history of                            multiple (3 or more) adenomas. Previous                            examinations 2009 (7 polyps ranging between 2 and                            20 mm); 2017 (4 polyps, adenomas and hyperplastic,                            poor prep) Medicines:                Monitored Anesthesia Care Procedure:                Pre-Anesthesia Assessment:                           - Prior to the procedure, a History and Physical                            was performed, and patient medications and                            allergies were reviewed. The patient's tolerance of                            previous anesthesia was also reviewed. The risks                            and benefits of the procedure and the sedation                            options and risks were discussed with the patient.                            All questions were answered, and informed consent                            was obtained. Prior Anticoagulants: The patient has  taken no previous anticoagulant or antiplatelet                            agents. ASA Grade Assessment: II - A patient with                            mild systemic disease. After reviewing the risks                            and benefits, the patient was deemed in                            satisfactory condition to undergo the procedure.                           After obtaining  informed consent, the colonoscope                            was passed under direct vision. Throughout the                            procedure, the patient's blood pressure, pulse, and                            oxygen saturations were monitored continuously. The                            Colonoscope was introduced through the anus and                            advanced to the the cecum, identified by                            appendiceal orifice and ileocecal valve. The                            ileocecal valve, appendiceal orifice, and rectum                            were photographed. The quality of the bowel                            preparation was good. The colonoscopy was performed                            without difficulty. The patient tolerated the                            procedure well. The bowel preparation used was                            SUPREP. Scope In: 3:31:41 PM Scope Out: 3:54:29 PM Scope Withdrawal Time: 0 hours 15 minutes 54 seconds  Total Procedure Duration: 0 hours 22 minutes 48  seconds  Findings:                 Five polyps were found in the sigmoid colon and                            transverse colon. The polyps were 2 to 5 mm in                            size. These polyps were removed with a cold snare.                            Resection and retrieval were complete.                           Scattered small-mouthed diverticula were found in                            the entire colon.                           Internal hemorrhoids were found during retroflexion.                           The exam was otherwise without abnormality .                            Excellent view from anal loss. Could not adequately                            retroflex secondary to inability to hold air. Complications:            No immediate complications. Estimated blood loss:                            None. Estimated Blood Loss:     Estimated blood loss:  none. Impression:               - Five 2 to 5 mm polyps in the sigmoid colon and in                            the transverse colon, removed with a cold snare.                            Resected and retrieved.                           - Diverticulosis in the entire examined colon.                           - Internal hemorrhoids.                           - The examination was otherwise normal . Recommendation:           - Repeat colonoscopy in 3 - 5 years for  surveillance.                           - Patient has a contact number available for                            emergencies. The signs and symptoms of potential                            delayed complications were discussed with the                            patient. Return to normal activities tomorrow.                            Written discharge instructions were provided to the                            patient.                           - Resume previous diet.                           - Continue present medications.                           - Await pathology results. Docia Chuck. Henrene Pastor, MD 07/01/2018 4:00:16 PM This report has been signed electronically.

## 2018-07-01 NOTE — Telephone Encounter (Signed)
Returned call to pt.  She feels she is cleaned out.  She is afraid of getting dehydrated.  RN encouraged her to keep drinking clear liquids and finish 2nd half of prep if possible.  Pt agreed and she will call again if any further concerns.  She will do prep at 11am, nothing by mouth after 1pm, and arrive here at 3pm. Dameon Soltis/phone note

## 2018-07-01 NOTE — Progress Notes (Signed)
To PACU, VSS. Report to RN.tb 

## 2018-07-02 ENCOUNTER — Telehealth: Payer: Self-pay

## 2018-07-02 NOTE — Telephone Encounter (Signed)
No answer

## 2018-07-02 NOTE — Telephone Encounter (Signed)
  Follow up Call-  Call back number 07/01/2018 01/23/2016  Post procedure Call Back phone  # [phone]  Permission to leave phone message Yes Yes  Some recent data might be hidden     Patient questions:  Do you have a fever, pain , or abdominal swelling? No. Pain Score  0 *  Have you tolerated food without any problems? Yes.    Have you been able to return to your normal activities? Yes.    Do you have any questions about your discharge instructions: Diet   No. Medications  No. Follow up visit  No.  Do you have questions or concerns about your Care? No.  Actions: * If pain score is 4 or above: No action needed, pain <4.

## 2018-07-02 NOTE — Telephone Encounter (Signed)
Patient returned phone call stating that she is feeling fine and not having any problems. °

## 2018-07-06 ENCOUNTER — Other Ambulatory Visit: Payer: Self-pay | Admitting: Family Medicine

## 2018-07-06 DIAGNOSIS — Z1231 Encounter for screening mammogram for malignant neoplasm of breast: Secondary | ICD-10-CM

## 2018-07-12 ENCOUNTER — Encounter: Payer: Self-pay | Admitting: Internal Medicine

## 2018-07-12 ENCOUNTER — Other Ambulatory Visit: Payer: Self-pay | Admitting: Family Medicine

## 2018-07-12 NOTE — Telephone Encounter (Signed)
[facility] is requesting to fill pt albuterol. Please advise KH 

## 2018-07-20 ENCOUNTER — Ambulatory Visit
Admission: RE | Admit: 2018-07-20 | Discharge: 2018-07-20 | Disposition: A | Discharge status: Home / Self Care | Payer: Medicare Other | Source: Ambulatory Visit | Attending: Family Medicine | Admitting: Family Medicine

## 2018-07-20 DIAGNOSIS — Z1231 Encounter for screening mammogram for malignant neoplasm of breast: Secondary | ICD-10-CM | POA: Diagnosis not present

## 2018-07-21 ENCOUNTER — Other Ambulatory Visit: Payer: Self-pay | Admitting: Family Medicine

## 2018-07-21 DIAGNOSIS — R921 Mammographic calcification found on diagnostic imaging of breast: Secondary | ICD-10-CM

## 2018-07-28 ENCOUNTER — Ambulatory Visit
Admission: RE | Admit: 2018-07-28 | Discharge: 2018-07-28 | Disposition: A | Discharge status: Home / Self Care | Payer: Medicare Other | Source: Ambulatory Visit | Attending: Family Medicine | Admitting: Family Medicine

## 2018-07-28 ENCOUNTER — Other Ambulatory Visit: Payer: Self-pay | Admitting: Family Medicine

## 2018-07-28 DIAGNOSIS — R921 Mammographic calcification found on diagnostic imaging of breast: Secondary | ICD-10-CM | POA: Diagnosis not present

## 2018-07-30 ENCOUNTER — Inpatient Hospital Stay: Admission: RE | Admit: 2018-07-30 | Payer: Medicare Other | Source: Ambulatory Visit

## 2018-09-18 ENCOUNTER — Other Ambulatory Visit: Payer: Self-pay | Admitting: Family Medicine

## 2018-09-18 DIAGNOSIS — J4530 Mild persistent asthma, uncomplicated: Secondary | ICD-10-CM

## 2018-09-20 NOTE — Telephone Encounter (Signed)
[facility] is requesting to fill pt albuterol. Please advise KH 

## 2018-09-21 ENCOUNTER — Other Ambulatory Visit: Payer: Self-pay | Admitting: Family Medicine

## 2018-09-21 ENCOUNTER — Ambulatory Visit
Admission: RE | Admit: 2018-09-21 | Discharge: 2018-09-21 | Disposition: A | Discharge status: Home / Self Care | Payer: Medicare Other | Source: Ambulatory Visit | Attending: Family Medicine | Admitting: Family Medicine

## 2018-09-21 DIAGNOSIS — N6012 Diffuse cystic mastopathy of left breast: Secondary | ICD-10-CM | POA: Diagnosis not present

## 2018-09-21 DIAGNOSIS — R921 Mammographic calcification found on diagnostic imaging of breast: Secondary | ICD-10-CM

## 2018-09-22 HISTORY — PX: BIOPSY BREAST: PRO8

## 2018-09-29 ENCOUNTER — Encounter: Payer: Self-pay | Admitting: Family Medicine

## 2018-10-07 ENCOUNTER — Ambulatory Visit (INDEPENDENT_AMBULATORY_CARE_PROVIDER_SITE_OTHER): Payer: Medicare Other | Admitting: Family Medicine

## 2018-10-07 ENCOUNTER — Encounter: Payer: Self-pay | Admitting: Family Medicine

## 2018-10-07 VITALS — BP 130/82 | HR 69 | Temp 98.0°F | Wt 222.2 lb

## 2018-10-07 DIAGNOSIS — J4530 Mild persistent asthma, uncomplicated: Secondary | ICD-10-CM | POA: Diagnosis not present

## 2018-10-07 MED ORDER — PROAIR HFA 108 (90 BASE) MCG/ACT IN AERS
2.0000 | INHALATION_SPRAY | Freq: Two times a day (BID) | RESPIRATORY_TRACT | 1 refills | Status: DC
Start: 2018-10-07 — End: 2019-01-17

## 2018-10-07 MED ORDER — FLUTICASONE FUROATE-VILANTEROL 100-25 MCG/INH IN AEPB: 1.0000 | INHALATION_SPRAY | Freq: Every day | RESPIRATORY_TRACT | 0 refills | Status: DC

## 2018-10-07 NOTE — Progress Notes (Signed)
   Subjective:    Patient ID: [name], female    DOB: 1956-02-03, 62 y.o.   MRN: [id number]  HPI She is here for follow-up.  She has been on Flovent 2 puffs twice per day but has had to use her Pro Air inhaler 2-3 times per day.  She was supposed to follow-up with me 1 month after being placed on Flovent but had breast biopsy which interfered with her returning here. Of note is the fact that she is now been smoke-free for about a year.  Review of Systems     Objective:   Physical Exam Alert and in no distress otherwise not examined.       Assessment & Plan:  Asthma, chronic, mild persistent, uncomplicated - Plan: fluticasone furoate-vilanterol (BREO ELLIPTA) 100-25 MCG/INH AEPB, PROAIR HFA 108 (90 Base) MCG/ACT inhaler Breo sample was given she was instructed on proper use of this medication.  She will call me in several weeks to let me know how she is doing.

## 2018-10-07 NOTE — Patient Instructions (Signed)
Use new medication daily and use the albuterol as needed.  Call me in a week and I will want to know how often you are having the use the albuterol. Control means that you are on an inhaler and only have to use the albuterol no more than twice a week during the day or twice a month at night

## 2018-10-13 ENCOUNTER — Telehealth: Payer: Self-pay | Admitting: Family Medicine

## 2018-10-13 MED ORDER — FLUTICASONE FUROATE-VILANTEROL 200-25 MCG/INH IN AEPB: 1.0000 | INHALATION_SPRAY | Freq: Every day | RESPIRATORY_TRACT | 0 refills | Status: DC

## 2018-10-13 NOTE — Telephone Encounter (Signed)
Pt come and states that she is doing good on the [place], States she can go with just taking the pro air just one time a day with taking the breo, seems to make her breathe a lot better, she is wanting to know if she can get some more samples, pt can be reached at [phone]

## 2018-10-13 NOTE — Telephone Encounter (Signed)
Have her come by and pick up the higher strength which is at the front desk.  Have her call me in 1 week.  I want her to be at the point where she does not have to use the albuterol

## 2018-10-14 NOTE — Telephone Encounter (Signed)
Pt is aware and she will pick up sample. [place]

## 2018-10-21 ENCOUNTER — Telehealth: Payer: Self-pay | Admitting: Family Medicine

## 2018-10-21 MED ORDER — FLUTICASONE FUROATE-VILANTEROL 200-25 MCG/INH IN AEPB: 1.0000 | INHALATION_SPRAY | Freq: Every day | RESPIRATORY_TRACT | 11 refills | Status: DC

## 2018-10-21 NOTE — Telephone Encounter (Signed)
Pt called and states she loves the Brio, she is not having to use ProAir. She will be back by in 14 days for another sample.

## 2018-11-03 ENCOUNTER — Telehealth: Payer: Self-pay | Admitting: Family Medicine

## 2018-11-03 NOTE — Telephone Encounter (Signed)
  Patient called she needs another sample of Breo  Please call

## 2018-11-04 NOTE — Telephone Encounter (Signed)
1 sample given, left message for pt

## 2018-11-04 NOTE — Telephone Encounter (Signed)
Give her one if we have it 

## 2018-11-07 ENCOUNTER — Other Ambulatory Visit: Payer: Self-pay | Admitting: Family Medicine

## 2018-11-07 DIAGNOSIS — F17200 Nicotine dependence, unspecified, uncomplicated: Secondary | ICD-10-CM

## 2018-11-08 ENCOUNTER — Ambulatory Visit: Payer: Medicare Other | Admitting: Family Medicine

## 2018-11-08 NOTE — Telephone Encounter (Signed)
[facility] is requesting to fill pt ibuprofen . Please advise KH 

## 2018-11-16 ENCOUNTER — Telehealth: Payer: Self-pay | Admitting: Family Medicine

## 2018-11-16 NOTE — Telephone Encounter (Signed)
Called Pt. And advised 2 samples per Dr. Redmond School are ready to be picked up 11-16-18

## 2018-11-16 NOTE — Telephone Encounter (Signed)
Pt called and is requesting a sample of the Breo she would like to get 2 of them if you are ok with that pt can be reached at [phone]

## 2018-12-21 ENCOUNTER — Telehealth: Payer: Self-pay | Admitting: Family Medicine

## 2018-12-21 NOTE — Telephone Encounter (Signed)
Have her follow-up with me in a couple of months

## 2018-12-21 NOTE — Telephone Encounter (Signed)
Pt called for samples of Breo, ok per Dr. Redmond School.  Also pt advised this works very well and ok to send in Rx to pharmacy to see if insurance will pay for this.  Also she will come in for an appt whenever you want her to

## 2018-12-21 NOTE — Telephone Encounter (Signed)
Pt was advised and will be in tomorrow to pick up sample and will make appt. Than. [place]

## 2018-12-25 NOTE — Telephone Encounter (Signed)
Appt made, kathy did you mean to schedule pt with [name]?

## 2018-12-27 NOTE — Telephone Encounter (Signed)
Moved to [employer]

## 2019-01-10 ENCOUNTER — Ambulatory Visit (INDEPENDENT_AMBULATORY_CARE_PROVIDER_SITE_OTHER): Payer: Medicare Other | Admitting: Family Medicine

## 2019-01-10 VITALS — BP 128/80 | HR 68 | Temp 98.0°F | Wt 223.6 lb

## 2019-01-10 DIAGNOSIS — J4530 Mild persistent asthma, uncomplicated: Secondary | ICD-10-CM

## 2019-01-10 DIAGNOSIS — R6889 Other general symptoms and signs: Secondary | ICD-10-CM | POA: Insufficient documentation

## 2019-01-10 NOTE — Progress Notes (Signed)
   Subjective:    Patient ID: [name], female    DOB: 12-Dec-1955, 63 y.o.   MRN: [id number]  HPI She is here for a recheck.  She states that the Brio is working quite well for her and she is using the rescue inhaler once or maybe twice per week.  She did have an asthma attack requiring her to use her nebulizer and would like to get a new nebulizer as apparently is quite old and starting to malfunction. She then proceeded to talk about the various aches and pains that she is having.  Again very difficult for her to stay on task.  She would point to multiple areas as to where she was having aches and pains and unable to walk or stand but then states that taking Tylenol will usually take the pain away for the majority of the day.  She can also then take an ibuprofen.  She then talked about possibly needing [employer] transportation because of all the aches and pains in spite of the fact that she stated earlier that the pain is under good control with minimal use of medication.   Review of Systems     Objective:   Physical Exam Alert and in no distress.  Cardiac exam shows regular rhythm without murmurs or gallops.  Lungs are clear to auscultation.       Assessment & Plan:  Asthma, chronic, mild persistent, uncomplicated  Multiple somatic complaints She will continue on her present medications. Very difficult to get a coherent history concerning any of the various aches and pains that she is having.

## 2019-01-10 NOTE — Patient Instructions (Signed)
You can take 2 Tylenol 4 times per day as needed for pain if that does not work then you can take 1 800 mg ibuprofen 3 times a day as needed for pain

## 2019-01-15 ENCOUNTER — Other Ambulatory Visit: Payer: Self-pay | Admitting: Family Medicine

## 2019-01-15 DIAGNOSIS — J4530 Mild persistent asthma, uncomplicated: Secondary | ICD-10-CM

## 2019-01-17 NOTE — Telephone Encounter (Signed)
Is this okay to refill? 

## 2019-02-01 ENCOUNTER — Telehealth: Payer: Self-pay | Admitting: Family Medicine

## 2019-02-01 NOTE — Telephone Encounter (Signed)
Pt requested sample Breo, ok per Dr. Redmond School

## 2019-02-18 ENCOUNTER — Telehealth: Payer: Self-pay | Admitting: Family Medicine

## 2019-02-18 NOTE — Telephone Encounter (Signed)
Pt called for sample of Breo, advised we are out.  Advised Rx at pharmacy, she will call and see if insurance will cover and affordable.

## 2019-04-15 NOTE — Telephone Encounter (Signed)
Pt's insurance did cover Breo & she was able to afford

## 2019-05-11 ENCOUNTER — Telehealth: Payer: Self-pay | Admitting: Family Medicine

## 2019-05-11 NOTE — Telephone Encounter (Signed)
Pt called and states that she needs a new meter for Diabetes. Her's is no longer working. She states she currently has a Accu check product. Please send to [facility] on [address].  ALSO, she states that her nebulizer broke too. She also needs another one of those. Pt can be reached at [phone].

## 2019-05-12 ENCOUNTER — Other Ambulatory Visit: Payer: Self-pay

## 2019-05-12 LAB — HEMOGLOBIN A1C: Hemoglobin A1C: 7.7

## 2019-05-12 MED ORDER — ACCU-CHEK AVIVA PLUS W/DEVICE KIT: PACK | 0 refills | Status: AC

## 2019-05-12 NOTE — Telephone Encounter (Signed)
Sent in 05-12-2019 [facility]

## 2019-05-16 ENCOUNTER — Other Ambulatory Visit: Payer: Self-pay | Admitting: Family Medicine

## 2019-05-16 DIAGNOSIS — J4530 Mild persistent asthma, uncomplicated: Secondary | ICD-10-CM

## 2019-05-16 DIAGNOSIS — I152 Hypertension secondary to endocrine disorders: Secondary | ICD-10-CM

## 2019-05-16 DIAGNOSIS — E1159 Type 2 diabetes mellitus with other circulatory complications: Secondary | ICD-10-CM

## 2019-05-16 NOTE — Telephone Encounter (Signed)
[facility] is requesting to fill pt pro air. Please advise. kh

## 2019-06-09 LAB — HEMOGLOBIN A1C: Hemoglobin A1C: 5.4

## 2019-06-14 ENCOUNTER — Other Ambulatory Visit: Payer: Self-pay | Admitting: Family Medicine

## 2019-06-14 DIAGNOSIS — J4530 Mild persistent asthma, uncomplicated: Secondary | ICD-10-CM

## 2019-06-15 NOTE — Telephone Encounter (Signed)
[facility] is requesting to fill pt pro air. Please advise KH 

## 2019-06-20 ENCOUNTER — Encounter: Payer: Self-pay | Admitting: Family Medicine

## 2019-06-23 ENCOUNTER — Other Ambulatory Visit: Payer: Self-pay | Admitting: Family Medicine

## 2019-06-23 DIAGNOSIS — Z1231 Encounter for screening mammogram for malignant neoplasm of breast: Secondary | ICD-10-CM

## 2019-07-30 ENCOUNTER — Other Ambulatory Visit: Payer: Self-pay | Admitting: Family Medicine

## 2019-07-30 DIAGNOSIS — J4530 Mild persistent asthma, uncomplicated: Secondary | ICD-10-CM

## 2019-08-02 ENCOUNTER — Telehealth: Payer: Self-pay | Admitting: Family Medicine

## 2019-08-02 NOTE — Telephone Encounter (Signed)
Take care of this 

## 2019-08-02 NOTE — Telephone Encounter (Signed)
Pt will be coming in for appt. [place]

## 2019-08-02 NOTE — Telephone Encounter (Signed)
Pt called and states that she did requested the nebulizer. We discussed this a huddle and is a legit request. Pt can be reached at [phone].

## 2019-08-09 ENCOUNTER — Encounter: Payer: Self-pay | Admitting: Family Medicine

## 2019-08-09 ENCOUNTER — Ambulatory Visit (INDEPENDENT_AMBULATORY_CARE_PROVIDER_SITE_OTHER): Payer: Medicare Other | Admitting: Family Medicine

## 2019-08-09 ENCOUNTER — Other Ambulatory Visit: Payer: Self-pay

## 2019-08-09 VITALS — BP 130/80 | HR 71 | Temp 98.4°F | Wt 218.4 lb

## 2019-08-09 DIAGNOSIS — J4530 Mild persistent asthma, uncomplicated: Secondary | ICD-10-CM

## 2019-08-09 DIAGNOSIS — R0609 Other forms of dyspnea: Secondary | ICD-10-CM

## 2019-08-09 DIAGNOSIS — R0600 Dyspnea, unspecified: Secondary | ICD-10-CM

## 2019-08-09 DIAGNOSIS — J301 Allergic rhinitis due to pollen: Secondary | ICD-10-CM | POA: Diagnosis not present

## 2019-08-09 NOTE — Progress Notes (Signed)
   Subjective:    Patient ID: [name], female    DOB: Sep 25, 1956, 63 y.o.   MRN: [id number]  HPI She is here for consult concerning her asthma.  She does use Brio Ellipta regularly.  Her use of albuterol apparently is sporadic but usually less than twice per week.  She does use the inhaler but finds that when her asthma is hard to control, the nebulizer does give her more relief.  She would like to continue to use the nebulizer on an as-needed basis.  She does use the inhaler when she goes up a flight of steps stating that she gets short of breath and takes roughly 5 minutes to recover from this but this has been constant for the last 5 years or longer with no associated chest pain, PND, rapid heart rate..   Review of Systems     Objective:   Physical Exam Alert and in no distress.  Cardiac exam shows regular rhythm without murmurs or gallops.  Lungs are clear to auscultation.  EKG shows no acute changes     Assessment & Plan:  Asthma, chronic, mild persistent, uncomplicated  DOE (dyspnea on exertion)  Chronic seasonal allergic rhinitis due to pollen At this point I do not think the dyspnea on exertion is cardiac related but will be monitoring this.  I will okay for her to get the nebulizer.  She will continue on her present medications.  Flu shot offered but she refused.

## 2019-08-11 ENCOUNTER — Other Ambulatory Visit: Payer: Self-pay

## 2019-08-11 ENCOUNTER — Ambulatory Visit
Admission: RE | Admit: 2019-08-11 | Discharge: 2019-08-11 | Disposition: A | Discharge status: Home / Self Care | Payer: Medicare Other | Source: Ambulatory Visit | Attending: Family Medicine | Admitting: Family Medicine

## 2019-08-11 DIAGNOSIS — Z1231 Encounter for screening mammogram for malignant neoplasm of breast: Secondary | ICD-10-CM

## 2019-08-12 DIAGNOSIS — J45998 Other asthma: Secondary | ICD-10-CM | POA: Diagnosis not present

## 2019-08-14 ENCOUNTER — Other Ambulatory Visit: Payer: Self-pay | Admitting: Family Medicine

## 2019-08-14 DIAGNOSIS — I152 Hypertension secondary to endocrine disorders: Secondary | ICD-10-CM

## 2019-08-14 DIAGNOSIS — E1159 Type 2 diabetes mellitus with other circulatory complications: Secondary | ICD-10-CM

## 2019-08-30 ENCOUNTER — Ambulatory Visit: Payer: Medicare Other | Admitting: Family Medicine

## 2019-09-11 DIAGNOSIS — J45998 Other asthma: Secondary | ICD-10-CM | POA: Diagnosis not present

## 2019-09-27 ENCOUNTER — Other Ambulatory Visit: Payer: Self-pay | Admitting: Family Medicine

## 2019-09-27 DIAGNOSIS — J4530 Mild persistent asthma, uncomplicated: Secondary | ICD-10-CM

## 2019-09-27 NOTE — Telephone Encounter (Signed)
[facility]  Is requesting to fill pt albuterol  Inhaler Please advise [facility]

## 2019-10-12 DIAGNOSIS — J45998 Other asthma: Secondary | ICD-10-CM | POA: Diagnosis not present

## 2019-11-04 ENCOUNTER — Other Ambulatory Visit: Payer: Self-pay | Admitting: Family Medicine

## 2019-11-04 DIAGNOSIS — J4530 Mild persistent asthma, uncomplicated: Secondary | ICD-10-CM

## 2019-11-11 DIAGNOSIS — J45998 Other asthma: Secondary | ICD-10-CM | POA: Diagnosis not present

## 2019-11-14 ENCOUNTER — Other Ambulatory Visit: Payer: Self-pay | Admitting: Family Medicine

## 2019-11-14 DIAGNOSIS — I152 Hypertension secondary to endocrine disorders: Secondary | ICD-10-CM

## 2019-11-14 DIAGNOSIS — E1159 Type 2 diabetes mellitus with other circulatory complications: Secondary | ICD-10-CM

## 2019-11-14 DIAGNOSIS — I10 Essential (primary) hypertension: Secondary | ICD-10-CM

## 2019-11-24 ENCOUNTER — Other Ambulatory Visit: Payer: Self-pay | Admitting: Family Medicine

## 2019-12-12 ENCOUNTER — Other Ambulatory Visit: Payer: Self-pay | Admitting: Family Medicine

## 2019-12-12 DIAGNOSIS — J45998 Other asthma: Secondary | ICD-10-CM | POA: Diagnosis not present

## 2019-12-12 DIAGNOSIS — J4530 Mild persistent asthma, uncomplicated: Secondary | ICD-10-CM

## 2019-12-12 NOTE — Telephone Encounter (Signed)
[facility] is requesting to fill pt albuterol inhaler. Please advise KH 

## 2019-12-30 DIAGNOSIS — J45998 Other asthma: Secondary | ICD-10-CM | POA: Diagnosis not present

## 2020-01-07 ENCOUNTER — Other Ambulatory Visit: Payer: Self-pay | Admitting: Family Medicine

## 2020-01-07 DIAGNOSIS — J4530 Mild persistent asthma, uncomplicated: Secondary | ICD-10-CM

## 2020-01-09 NOTE — Telephone Encounter (Signed)
[facility] is requesting to fill pt pro air. Please advise KH 

## 2020-01-11 ENCOUNTER — Other Ambulatory Visit: Payer: Self-pay | Admitting: Family Medicine

## 2020-01-12 DIAGNOSIS — J45998 Other asthma: Secondary | ICD-10-CM | POA: Diagnosis not present

## 2020-02-09 DIAGNOSIS — J45998 Other asthma: Secondary | ICD-10-CM | POA: Diagnosis not present

## 2020-02-14 ENCOUNTER — Telehealth: Payer: Self-pay

## 2020-02-14 NOTE — Telephone Encounter (Signed)
I received a fax from [facility] for a refill on her lisinopril/hctz pt. Last apt. Was 08/09/19.

## 2020-02-15 ENCOUNTER — Other Ambulatory Visit: Payer: Self-pay

## 2020-02-15 ENCOUNTER — Other Ambulatory Visit: Payer: Self-pay | Admitting: Family Medicine

## 2020-02-15 DIAGNOSIS — I152 Hypertension secondary to endocrine disorders: Secondary | ICD-10-CM

## 2020-02-15 DIAGNOSIS — E1159 Type 2 diabetes mellitus with other circulatory complications: Secondary | ICD-10-CM

## 2020-02-15 MED ORDER — LISINOPRIL-HYDROCHLOROTHIAZIDE 20-12.5 MG PO TABS: 1.0000 | ORAL_TABLET | Freq: Every day | ORAL | 0 refills | Status: DC

## 2020-02-15 NOTE — Telephone Encounter (Signed)
Done KH 

## 2020-02-16 ENCOUNTER — Ambulatory Visit: Payer: Self-pay | Admitting: Family Medicine

## 2020-02-17 ENCOUNTER — Other Ambulatory Visit: Payer: Self-pay | Admitting: Family Medicine

## 2020-02-17 DIAGNOSIS — J4530 Mild persistent asthma, uncomplicated: Secondary | ICD-10-CM

## 2020-02-17 NOTE — Telephone Encounter (Signed)
[facility] is requesting to fill pt breo and pro air. Please advise. [place]

## 2020-02-22 DIAGNOSIS — J45998 Other asthma: Secondary | ICD-10-CM | POA: Diagnosis not present

## 2020-02-27 ENCOUNTER — Ambulatory Visit (INDEPENDENT_AMBULATORY_CARE_PROVIDER_SITE_OTHER): Payer: Medicare Other | Admitting: Family Medicine

## 2020-02-27 ENCOUNTER — Encounter: Payer: Self-pay | Admitting: Family Medicine

## 2020-02-27 VITALS — BP 140/82 | HR 72 | Temp 97.5°F | Wt 225.8 lb

## 2020-02-27 DIAGNOSIS — J4530 Mild persistent asthma, uncomplicated: Secondary | ICD-10-CM

## 2020-02-27 DIAGNOSIS — E669 Obesity, unspecified: Secondary | ICD-10-CM | POA: Diagnosis not present

## 2020-02-27 DIAGNOSIS — E118 Type 2 diabetes mellitus with unspecified complications: Secondary | ICD-10-CM

## 2020-02-27 DIAGNOSIS — R6889 Other general symptoms and signs: Secondary | ICD-10-CM | POA: Diagnosis not present

## 2020-02-27 DIAGNOSIS — E785 Hyperlipidemia, unspecified: Secondary | ICD-10-CM

## 2020-02-27 DIAGNOSIS — E1169 Type 2 diabetes mellitus with other specified complication: Secondary | ICD-10-CM | POA: Diagnosis not present

## 2020-02-27 DIAGNOSIS — E1159 Type 2 diabetes mellitus with other circulatory complications: Secondary | ICD-10-CM

## 2020-02-27 DIAGNOSIS — I10 Essential (primary) hypertension: Secondary | ICD-10-CM | POA: Diagnosis not present

## 2020-02-27 DIAGNOSIS — I152 Hypertension secondary to endocrine disorders: Secondary | ICD-10-CM

## 2020-02-27 LAB — POCT UA - MICROALBUMIN
Albumin/Creatinine Ratio, Urine, POC: 8.3
Creatinine, POC: 144.9 mg/dL
Microalbumin Ur, POC: 12 mg/L

## 2020-02-27 LAB — POCT GLYCOSYLATED HEMOGLOBIN (HGB A1C): Hemoglobin A1C: 6.7 % — AB (ref 4.0–5.6)

## 2020-02-27 NOTE — Patient Instructions (Addendum)
The only time you use either the nebulizer or the albuterol as if you need it Take the melatonin nightly. Stop the [facility] and use the new medicine once a day.  Call me in 2 weeks

## 2020-02-27 NOTE — Progress Notes (Signed)
Subjective:    Patient ID: [name], female    DOB: 10/06/1956, 64 y.o.   MRN: [id number]  [name] is a 64 y.o. female who presents for follow-up of Type 2 diabetes mellitus.  Home blood sugar records: meter records 98-154 post meal  Current symptoms/problems include none at this time . Daily foot checks:yes   Any foot concerns: swelling on top Exercise: very little due to leg pain Diet: regular she was seen recently in the home visit and apparently the hemoglobin A1c earlier today was 6.2.  The information was reviewed.  She does complain of difficulty with her breathing and is using Breo but also using albuterol.  When quizzed about how often she is using the albuterol, I got conflicting report on exactly how often she is using it and for what purpose.  She then went on to describe multiple other aches and pains that she was having making it difficult to get a good history from her.  She continues on atorvastatin without aches or pains.  She is using Breo as well as albuterol as mentioned above.  She is taking lisinopril/HCTZ without trouble. The following portions of the patient's history were reviewed and updated as appropriate: allergies, current medications, past medical history, past social history and problem list.  ROS as in subjective above.     Objective:    Physical Exam Alert and in no distress cardiac exam shows regular rhythm without murmurs gallops.  Lungs are clear to auscultation.   Hemoglobin A1c in the office is 6.7 Lab Review Diabetic Labs Latest Ref Rng & Units 06/09/2019 05/12/2019 06/25/2018 05/25/2018 02/16/2018  HbA1c - 5.4 7.7 - - 5.7  Microalbumin mg/L - - - - -  Micro/Creat Ratio - - - - - -  Chol 100 - 199 mg/dL - - - 155 -  HDL >39 mg/dL - - - 55 -  Calc LDL 0 - 99 mg/dL - - - 84 -  Triglycerides 0 - 149 mg/dL - - - 81 -  Creatinine 0.44 - 1.00 mg/dL - - 1.16(H) 1.23(H) -   BP/Weight 08/09/2019 01/10/2019 10/07/2018 07/01/2018 [id number]    Systolic BP [id number] 0000000  Diastolic BP 80 80 82 60 68  Wt. (Lbs) 218.4 223.6 222.2 214 214  BMI 34.21 35.02 34.8 33.52 33.52     Alanna  reports that she has quit smoking. Her smoking use included cigarettes. She has a 2.50 pack-year smoking history. She has never used smokeless tobacco. She reports current alcohol use. She reports current drug use. Drug: Marijuana.     Assessment & Plan:    Type 2 diabetes mellitus with complications (HCC) - Plan: POCT glycosylated hemoglobin (Hb A1C), POCT UA - Microalbumin  Obesity (BMI 30-39.9)  Multiple somatic complaints  Hypertension associated with diabetes ([place]) - Plan: CBC with Differential, Comprehensive metabolic panel  Asthma, chronic, mild persistent, uncomplicated  Hyperlipidemia associated with type 2 diabetes mellitus ([place]) - Plan: Lipid Panel    1. Rx changes: A sample of Trelegy given with instructions on proper use.  She is to call me in 2 weeks.  I did give her the high dose.  She is also to stop taking the Breo Ellipta  2. education: Reviewed 'ABCs' of diabetes management (respective goals in parentheses):  A1C (<7), blood pressure (<130/80), and cholesterol (LDL <100). 3. Compliance at present is estimated to be fair. Efforts to improve compliance (if necessary) will be directed at increased exercise.  4. Follow up: 4 months

## 2020-02-28 LAB — COMPREHENSIVE METABOLIC PANEL
ALT: 17 IU/L (ref 0–32)
AST: 14 IU/L (ref 0–40)
Albumin/Globulin Ratio: 1.6 (ref 1.2–2.2)
Albumin: 4.5 g/dL (ref 3.8–4.8)
Alkaline Phosphatase: 63 IU/L (ref 39–117)
BUN/Creatinine Ratio: 25 (ref 12–28)
BUN: 33 mg/dL — ABNORMAL HIGH (ref 8–27)
Bilirubin Total: 0.2 mg/dL (ref 0.0–1.2)
CO2: 24 mmol/L (ref 20–29)
Calcium: 10 mg/dL (ref 8.7–10.3)
Chloride: 103 mmol/L (ref 96–106)
Creatinine, Ser: 1.33 mg/dL — ABNORMAL HIGH (ref 0.57–1.00)
GFR calc Af Amer: 48 mL/min/{1.73_m2} — ABNORMAL LOW (ref >59)
GFR calc non Af Amer: 42 mL/min/{1.73_m2} — ABNORMAL LOW (ref >59)
Globulin, Total: 2.9 g/dL (ref 1.5–4.5)
Glucose: 92 mg/dL (ref 65–99)
Potassium: 5 mmol/L (ref 3.5–5.2)
Sodium: 141 mmol/L (ref 134–144)
Total Protein: 7.4 g/dL (ref 6.0–8.5)

## 2020-02-28 LAB — LIPID PANEL
Chol/HDL Ratio: 3.7 ratio (ref 0.0–4.4)
Cholesterol, Total: 183 mg/dL (ref 100–199)
HDL: 49 mg/dL (ref >39)
LDL Chol Calc (NIH): 104 mg/dL — ABNORMAL HIGH (ref 0–99)
Triglycerides: 171 mg/dL — ABNORMAL HIGH (ref 0–149)
VLDL Cholesterol Cal: 30 mg/dL (ref 5–40)

## 2020-02-28 LAB — CBC WITH DIFFERENTIAL/PLATELET
Basophils Absolute: 0.1 10*3/uL (ref 0.0–0.2)
Basos: 1 %
EOS (ABSOLUTE): 0.3 10*3/uL (ref 0.0–0.4)
Eos: 3 %
Hematocrit: 35.6 % (ref 34.0–46.6)
Hemoglobin: 12 g/dL (ref 11.1–15.9)
Immature Grans (Abs): 0 10*3/uL (ref 0.0–0.1)
Immature Granulocytes: 0 %
Lymphocytes Absolute: 2.7 10*3/uL (ref 0.7–3.1)
Lymphs: 29 %
MCH: 31 pg (ref 26.6–33.0)
MCHC: 33.7 g/dL (ref 31.5–35.7)
MCV: 92 fL (ref 79–97)
Monocytes Absolute: 0.8 10*3/uL (ref 0.1–0.9)
Monocytes: 8 %
Neutrophils Absolute: 5.5 10*3/uL (ref 1.4–7.0)
Neutrophils: 59 %
Platelets: 368 10*3/uL (ref 150–450)
RBC: 3.87 x10E6/uL (ref 3.77–5.28)
RDW: 12.2 % (ref 11.7–15.4)
WBC: 9.4 10*3/uL (ref 3.4–10.8)

## 2020-02-28 MED ORDER — ATORVASTATIN CALCIUM 40 MG PO TABS: 40.0000 mg | ORAL_TABLET | Freq: Every day | ORAL | 3 refills | Status: DC

## 2020-02-28 NOTE — Addendum Note (Signed)
Addended by: [name] on: 02/28/2020 12:39 PM   Modules accepted: Orders

## 2020-02-29 ENCOUNTER — Telehealth: Payer: Self-pay

## 2020-02-29 NOTE — Telephone Encounter (Signed)
Pt. Called back stating you just LM for her and she had a question about her medications for if you could call her back.

## 2020-02-29 NOTE — Telephone Encounter (Signed)
Pt was called back  and advised of med time. [place]

## 2020-03-08 ENCOUNTER — Ambulatory Visit: Payer: Medicare Other | Attending: Family

## 2020-03-08 DIAGNOSIS — Z23 Encounter for immunization: Secondary | ICD-10-CM

## 2020-03-08 NOTE — Progress Notes (Signed)
   Covid-19 Vaccination Clinic  Name:  Giovanny Eschrich    MRN: [id number] DOB: September 11, 1956  03/08/2020  Ms. Zila Leas was observed post Covid-19 immunization for 15 minutes without incident. She was provided with Vaccine Information Sheet and instruction to access the V-Safe system.   Ms. Mystery Villatoro was instructed to call 911 with any severe reactions post vaccine: Marland Kitchen Difficulty breathing  . Swelling of face and throat  . A fast heartbeat  . A bad rash all over body  . Dizziness and weakness   Immunizations Administered    Name Date Dose VIS Date Route   Moderna COVID-19 Vaccine 03/08/2020  1:17 PM 0.5 mL 11/01/2019 Intramuscular   Manufacturer: Moderna   Lot: [id number]   [id number]

## 2020-03-11 DIAGNOSIS — J45998 Other asthma: Secondary | ICD-10-CM | POA: Diagnosis not present

## 2020-03-13 ENCOUNTER — Telehealth: Payer: Self-pay | Admitting: Family Medicine

## 2020-03-13 ENCOUNTER — Other Ambulatory Visit: Payer: Self-pay | Admitting: Family Medicine

## 2020-03-13 DIAGNOSIS — F17200 Nicotine dependence, unspecified, uncomplicated: Secondary | ICD-10-CM

## 2020-03-13 NOTE — Telephone Encounter (Signed)
Find out if that works better than her [employer]

## 2020-03-13 NOTE — Telephone Encounter (Signed)
Pt called and said she received samples of Trelegy at her las visit and is currently low. She wanted to see if she could get a prescription for it or get more samples?

## 2020-03-13 NOTE — Telephone Encounter (Signed)
[facility] is requesting to fill pt ibuprofen . Please advise KH 

## 2020-03-14 MED ORDER — TRELEGY ELLIPTA 200-62.5-25 MCG/INH IN AEPB: 1.0000 | INHALATION_SPRAY | Freq: Every day | RESPIRATORY_TRACT | 11 refills | Status: DC

## 2020-03-14 NOTE — Telephone Encounter (Signed)
Pt advise that trelegy works better. Please advise [facility]

## 2020-03-14 NOTE — Telephone Encounter (Signed)
Pt say trelegy works better. [place]

## 2020-04-03 ENCOUNTER — Ambulatory Visit: Payer: Medicare Other | Attending: Family

## 2020-04-03 DIAGNOSIS — Z23 Encounter for immunization: Secondary | ICD-10-CM

## 2020-04-03 DIAGNOSIS — J45998 Other asthma: Secondary | ICD-10-CM | POA: Diagnosis not present

## 2020-04-03 NOTE — Progress Notes (Signed)
   Covid-19 Vaccination Clinic  Name:  [name]    MRN: [id number] DOB: 17-Aug-1956  04/03/2020  Ms. [name] was observed post Covid-19 immunization for 15 minutes without incident. She was provided with Vaccine Information Sheet and instruction to access the V-Safe system.   Ms. [name] was instructed to call 911 with any severe reactions post vaccine: Marland Kitchen Difficulty breathing  . Swelling of face and throat  . A fast heartbeat  . A bad rash all over body  . Dizziness and weakness   Immunizations Administered    Name Date Dose VIS Date Route   Moderna COVID-19 Vaccine 04/03/2020 12:51 PM 0.5 mL 11/2019 Intramuscular   Manufacturer: Moderna   Lot: [id number]   [id number]

## 2020-04-10 ENCOUNTER — Ambulatory Visit: Payer: Medicare Other

## 2020-04-10 DIAGNOSIS — J45998 Other asthma: Secondary | ICD-10-CM | POA: Diagnosis not present

## 2020-04-15 ENCOUNTER — Other Ambulatory Visit: Payer: Self-pay | Admitting: Family Medicine

## 2020-04-15 DIAGNOSIS — J4530 Mild persistent asthma, uncomplicated: Secondary | ICD-10-CM

## 2020-04-16 NOTE — Telephone Encounter (Signed)
[facility] is requesting to fill pt pro air. Please advise KH 

## 2020-04-27 DIAGNOSIS — Z9849 Cataract extraction status, unspecified eye: Secondary | ICD-10-CM | POA: Insufficient documentation

## 2020-04-27 DIAGNOSIS — H259 Unspecified age-related cataract: Secondary | ICD-10-CM | POA: Insufficient documentation

## 2020-05-11 DIAGNOSIS — J45998 Other asthma: Secondary | ICD-10-CM | POA: Diagnosis not present

## 2020-05-20 ENCOUNTER — Other Ambulatory Visit: Payer: Self-pay | Admitting: Family Medicine

## 2020-05-20 DIAGNOSIS — E1159 Type 2 diabetes mellitus with other circulatory complications: Secondary | ICD-10-CM

## 2020-05-20 DIAGNOSIS — I152 Hypertension secondary to endocrine disorders: Secondary | ICD-10-CM

## 2020-05-20 DIAGNOSIS — J4530 Mild persistent asthma, uncomplicated: Secondary | ICD-10-CM

## 2020-05-21 NOTE — Telephone Encounter (Signed)
[facility] is [facility] to fill pt pro air. Please advise [facility]

## 2020-06-10 DIAGNOSIS — J45998 Other asthma: Secondary | ICD-10-CM | POA: Diagnosis not present

## 2020-06-24 ENCOUNTER — Other Ambulatory Visit: Payer: Self-pay | Admitting: Family Medicine

## 2020-06-24 DIAGNOSIS — J4530 Mild persistent asthma, uncomplicated: Secondary | ICD-10-CM

## 2020-06-25 NOTE — Telephone Encounter (Signed)
[facility] to fill pt Pro air. Please advise [facility]

## 2020-06-27 ENCOUNTER — Other Ambulatory Visit: Payer: Self-pay | Admitting: Family Medicine

## 2020-06-27 DIAGNOSIS — J4530 Mild persistent asthma, uncomplicated: Secondary | ICD-10-CM

## 2020-07-11 ENCOUNTER — Other Ambulatory Visit: Payer: Self-pay | Admitting: Family Medicine

## 2020-07-11 DIAGNOSIS — Z1231 Encounter for screening mammogram for malignant neoplasm of breast: Secondary | ICD-10-CM

## 2020-07-11 DIAGNOSIS — J45998 Other asthma: Secondary | ICD-10-CM | POA: Diagnosis not present

## 2020-07-23 ENCOUNTER — Other Ambulatory Visit: Payer: Self-pay | Admitting: Family Medicine

## 2020-07-23 DIAGNOSIS — J4530 Mild persistent asthma, uncomplicated: Secondary | ICD-10-CM

## 2020-07-23 NOTE — Telephone Encounter (Signed)
Is this ok to refill last filled 06/25/20 last apt. Was 02/27/20 and next apt is 08/30/20

## 2020-08-11 DIAGNOSIS — J45998 Other asthma: Secondary | ICD-10-CM | POA: Diagnosis not present

## 2020-08-13 ENCOUNTER — Other Ambulatory Visit: Payer: Self-pay | Admitting: Family Medicine

## 2020-08-13 ENCOUNTER — Other Ambulatory Visit: Payer: Self-pay

## 2020-08-13 ENCOUNTER — Ambulatory Visit
Admission: RE | Admit: 2020-08-13 | Discharge: 2020-08-13 | Disposition: A | Discharge status: Home / Self Care | Payer: Medicare Other | Source: Ambulatory Visit | Attending: Family Medicine | Admitting: Family Medicine

## 2020-08-13 DIAGNOSIS — Z1231 Encounter for screening mammogram for malignant neoplasm of breast: Secondary | ICD-10-CM | POA: Diagnosis not present

## 2020-08-13 DIAGNOSIS — E1159 Type 2 diabetes mellitus with other circulatory complications: Secondary | ICD-10-CM

## 2020-08-13 DIAGNOSIS — I152 Hypertension secondary to endocrine disorders: Secondary | ICD-10-CM

## 2020-08-16 ENCOUNTER — Ambulatory Visit (INDEPENDENT_AMBULATORY_CARE_PROVIDER_SITE_OTHER): Payer: Medicare Other | Admitting: Podiatry

## 2020-08-16 ENCOUNTER — Other Ambulatory Visit: Payer: Self-pay | Admitting: Podiatry

## 2020-08-16 ENCOUNTER — Encounter: Payer: Self-pay | Admitting: Podiatry

## 2020-08-16 ENCOUNTER — Other Ambulatory Visit: Payer: Self-pay

## 2020-08-16 DIAGNOSIS — L84 Corns and callosities: Secondary | ICD-10-CM

## 2020-08-16 DIAGNOSIS — M216X9 Other acquired deformities of unspecified foot: Secondary | ICD-10-CM

## 2020-08-16 DIAGNOSIS — M21622 Bunionette of left foot: Secondary | ICD-10-CM | POA: Diagnosis not present

## 2020-08-16 DIAGNOSIS — Q667 Congenital pes cavus, unspecified foot: Secondary | ICD-10-CM

## 2020-08-16 DIAGNOSIS — M21621 Bunionette of right foot: Secondary | ICD-10-CM | POA: Diagnosis not present

## 2020-08-17 ENCOUNTER — Ambulatory Visit: Payer: Medicare Other | Admitting: Podiatry

## 2020-08-21 NOTE — Progress Notes (Signed)
Subjective:   Patient ID: [name], female   DOB: 64 y.o.   MRN: [id number]   HPI Patient presents with severe lesions on the bottom of both feet which have become increasingly sore over the last several years. States that she has tried to wear different shoes and pad without relief along with occasional trimming but that does not seem to be helping her. Patient does not smoke and likes to be active if possible   Review of Systems  All other systems reviewed and are negative.       Objective:  Physical Exam Vitals and nursing note reviewed.  Constitutional:      Appearance: She is well-developed.  Pulmonary:     Effort: Pulmonary effort is normal.  Musculoskeletal:        General: Normal range of motion.  Skin:    General: Skin is warm.  Neurological:     Mental Status: She is alert.     Neurovascular status intact muscle strength found to be adequate range of motion within normal limits. Patient does have diabetes that is under reasonably good control and is been present for a number of years and is found to have severe keratotic lesions underneath the first and fifth metatarsals of both feet with a plantarflexed first metatarsal high arch foot structure bilateral. Patient is noted to have good digital perfusion is well oriented x3 with mild diminishment of sharp dull vibratory     Assessment:  Long-term diabetic who has mild neuropathic-like symptomatology with severe lesion formation subfirst and fifth metatarsal bilateral      Plan:  H&P reviewed all conditions. At this point I did go ahead and using sharp sterile debridement I debrided lesions on the first and fifth metatarsal discussed shoe gear modifications padding that she can utilize and discussed other treatments we could do including possible metatarsal head resection or possible first metatarsal elevating osteotomy. I would like to avoid this and patient agrees and will be seen back as needed which will  dictate what will need to do in the future

## 2020-08-24 ENCOUNTER — Other Ambulatory Visit: Payer: Self-pay | Admitting: Family Medicine

## 2020-08-24 DIAGNOSIS — J4530 Mild persistent asthma, uncomplicated: Secondary | ICD-10-CM

## 2020-08-24 NOTE — Telephone Encounter (Signed)
[facility] is requesting to fill pt albuterol inhaler. Please advise KH 

## 2020-08-30 ENCOUNTER — Encounter: Payer: Self-pay | Admitting: Family Medicine

## 2020-08-30 ENCOUNTER — Ambulatory Visit (INDEPENDENT_AMBULATORY_CARE_PROVIDER_SITE_OTHER): Payer: Medicare Other | Admitting: Family Medicine

## 2020-08-30 ENCOUNTER — Other Ambulatory Visit: Payer: Self-pay

## 2020-08-30 VITALS — BP 128/76 | HR 65 | Temp 98.8°F | Ht 65.0 in | Wt 225.2 lb

## 2020-08-30 DIAGNOSIS — M25551 Pain in right hip: Secondary | ICD-10-CM

## 2020-08-30 DIAGNOSIS — K219 Gastro-esophageal reflux disease without esophagitis: Secondary | ICD-10-CM

## 2020-08-30 DIAGNOSIS — Z86010 Personal history of colonic polyps: Secondary | ICD-10-CM

## 2020-08-30 DIAGNOSIS — J4530 Mild persistent asthma, uncomplicated: Secondary | ICD-10-CM | POA: Diagnosis not present

## 2020-08-30 DIAGNOSIS — Z1159 Encounter for screening for other viral diseases: Secondary | ICD-10-CM

## 2020-08-30 DIAGNOSIS — Z0000 Encounter for general adult medical examination without abnormal findings: Secondary | ICD-10-CM | POA: Diagnosis not present

## 2020-08-30 DIAGNOSIS — I10 Essential (primary) hypertension: Secondary | ICD-10-CM | POA: Diagnosis not present

## 2020-08-30 DIAGNOSIS — E1159 Type 2 diabetes mellitus with other circulatory complications: Secondary | ICD-10-CM

## 2020-08-30 DIAGNOSIS — N3281 Overactive bladder: Secondary | ICD-10-CM | POA: Diagnosis not present

## 2020-08-30 DIAGNOSIS — H259 Unspecified age-related cataract: Secondary | ICD-10-CM

## 2020-08-30 DIAGNOSIS — R6889 Other general symptoms and signs: Secondary | ICD-10-CM

## 2020-08-30 DIAGNOSIS — I152 Hypertension secondary to endocrine disorders: Secondary | ICD-10-CM

## 2020-08-30 DIAGNOSIS — N3000 Acute cystitis without hematuria: Secondary | ICD-10-CM

## 2020-08-30 DIAGNOSIS — E785 Hyperlipidemia, unspecified: Secondary | ICD-10-CM

## 2020-08-30 DIAGNOSIS — E118 Type 2 diabetes mellitus with unspecified complications: Secondary | ICD-10-CM

## 2020-08-30 DIAGNOSIS — E669 Obesity, unspecified: Secondary | ICD-10-CM | POA: Diagnosis not present

## 2020-08-30 DIAGNOSIS — J301 Allergic rhinitis due to pollen: Secondary | ICD-10-CM

## 2020-08-30 DIAGNOSIS — E2839 Other primary ovarian failure: Secondary | ICD-10-CM

## 2020-08-30 DIAGNOSIS — E1169 Type 2 diabetes mellitus with other specified complication: Secondary | ICD-10-CM

## 2020-08-30 LAB — POCT URINALYSIS DIP (PROADVANTAGE DEVICE)
Bilirubin, UA: NEGATIVE
Glucose, UA: NEGATIVE mg/dL
Ketones, POC UA: NEGATIVE mg/dL
Leukocytes, UA: NEGATIVE
Nitrite, UA: POSITIVE — AB
Protein Ur, POC: NEGATIVE mg/dL
Specific Gravity, Urine: 1.03
Urobilinogen, Ur: 0.2
pH, UA: 5 (ref 5.0–8.0)

## 2020-08-30 LAB — POCT GLYCOSYLATED HEMOGLOBIN (HGB A1C): Hemoglobin A1C: 7.3 % — AB (ref 4.0–5.6)

## 2020-08-30 MED ORDER — OXYBUTYNIN CHLORIDE ER 5 MG PO TB24: 5.0000 mg | ORAL_TABLET | Freq: Every day | ORAL | 1 refills | Status: DC

## 2020-08-30 NOTE — Progress Notes (Addendum)
[name] is a 64 y.o. female who presents for annual wellness visit,CPE and follow-up on chronic medical conditions.  She is a very difficult person to get a good history from as she does tend to change the subject quite often.  She does complain of some right hip pain that has been going on for several months.  She is now using a cane that she is getting on her own to help with mobility.  No history of injury, numbness tingling weakness down her leg.  She also complains of urinary urgency has been going on for the last several months.  She is now starting to wear a pad but has no fever chills or dysuria.  She does have a history of colonic polyps and is scheduled for routine follow-up concerning this.  Presently she is on no medicine for her diabetes but does admit to dietary indiscretion especially in regard to drinking sodas.  She does need to follow-up eye exam.  Her exercise is quite minimal and she blames this on her hip.  She continues on lisinopril without difficulty.  She is also taking atorvastatin and having no aches or pains.  She continues on Trelegy and she is also using albuterol.  When quizzed concerning the use of the albuterol she is again equivocal stating she is using it daily then she states she is using it less often.  She blames this on going up a flight of steps and becoming slightly short of breath.  Her reflux seems to be under good control.  She does have cataracts and does need follow-up concerning that.  Immunizations and Health Maintenance Immunization History  Administered Date(s) Administered   Hepatitis B 08/13/2012, 09/10/2012   Hepatitis B, adult 09/28/2013   Moderna SARS-COVID-2 Vaccination 03/08/2020, 04/03/2020   PPD Test 08/08/2014, 09/10/2015, 09/01/2016   Pneumococcal Conjugate-13 03/06/2017   Pneumococcal Polysaccharide-23 08/13/2012   Tdap 08/13/2012   Zoster 10/10/2014   Zoster Recombinat (Shingrix) 01/12/2017, 07/18/2017   Health  Maintenance Due  Topic Date Due   Hepatitis C Screening  Never done   FOOT EXAM  Never done   OPHTHALMOLOGY EXAM  Never done   DEXA SCAN  Never done   PNA vac Low Risk Adult (2 of 2 - PPSV23) 06/12/2019   INFLUENZA VACCINE  Never done   HEMOGLOBIN A1C  08/29/2020    Last Pap smear: N/A Last mammogram: 08/13/20 Last colonoscopy:07/01/18 Last DEXA: ordered Dentist: Q year Ophtho: Q year Exercise: N/A  Other doctors caring for patient include: Dr.Regal podiatry, Dr. Donne Anon   Advanced directives: Does Patient Have a Medical Advance Directive?: No Would patient like information on creating a medical advance directive?: Yes (ED - Information included in AVS)  Depression screen:  See questionnaire below.  Depression screen [facility] 2/9 08/30/2020 05/25/2018 08/18/2017 09/28/2013  Decreased Interest 0 0 0 0  Down, Depressed, Hopeless 0 0 0 0  PHQ - 2 Score 0 0 0 0    Fall Risk Screen: see questionnaire below. Fall Risk  08/30/2020 05/25/2018 09/28/2013  Falls in the past year? 0 No No  Risk for fall due to : No Fall Risks - -    ADL screen:  See questionnaire below Functional Status Survey: Is the patient deaf or have difficulty hearing?: No Does the patient have difficulty seeing, even when wearing glasses/contacts?: No Does the patient have difficulty concentrating, remembering, or making decisions?: Yes (remembering) Does the patient have difficulty walking or climbing stairs?: Yes (right hip  and back) Does the patient have difficulty dressing or bathing?: No Does the patient have difficulty doing errands alone such as visiting a doctor's office or shopping?: No   Review of Systems Constitutional: -, -unexpected weight change, -anorexia, -fatigue Allergy: -sneezing, -itching, -congestion Dermatology: denies changing moles, rash, lumps ENT: -runny nose, -ear pain, -sore throat,  Cardiology:  -chest pain, -palpitations, -orthopnea, Respiratory: -cough, -shortness of  breath, -dyspnea on exertion, -wheezing,  Gastroenterology: -abdominal pain, -nausea, -vomiting, -diarrhea, -constipation, -dysphagia Hematology: -bleeding or bruising problems Musculoskeletal: -arthralgias, -myalgias, -joint swelling, -back pain, - Ophthalmology: -vision changes,  Urology: -dysuria, -difficulty urinating,  -urinary frequency, -urgency, incontinence Neurology: -, -numbness, , -memory loss, -falls, -dizziness    PHYSICAL EXAM: General Appearance: Alert, cooperative, no distress, appears stated age Head: Normocephalic, without obvious abnormality, atraumatic Eyes: PERRL, conjunctiva/corneas clear, EOM's intact,  Ears: Normal TM's and external ear canals Nose: Nares normal, mucosa normal, no drainage or sinus tenderness Throat: Lips, mucosa, and tongue normal; teeth and gums normal Neck: Supple, no lymphadenopathy;  thyroid:  no enlargement/tenderness/nodules; no carotid bruit or JVD Lungs: Clear to auscultation bilaterally without wheezes, rales or ronchi; respirations unlabored Heart: Regular rate and rhythm, S1 and S2 normal, no murmur, rubor gallop Abdomen: Soft, non-tender, nondistended, normoactive bowel sounds,  no masses, no hepatosplenomegaly Extremities: No clubbing, cyanosis or edema Pulses: 2+ and symmetric all extremities Skin:  Skin color, texture, turgor normal, no rashes or lesions Lymph nodes: Cervical, supraclavicular, and axillary nodes normal Neurologic:  CNII-XII intact, normal strength, sensation and gait; reflexes 2+ and symmetric throughout Psych: Normal mood, affect, hygiene and grooming. Urine microscopic did show epithelial cells as well as bacteria, TNTC.  I will get a culture. Hemoglobin A1c is 7.3 ASSESSMENT/PLAN: Routine general medical examination at a health care facility - Plan: CBC with Differential/Platelet, Comprehensive metabolic panel, Lipid panel  Type 2 diabetes mellitus with complications ([place]) - Plan: CBC with  Differential/Platelet, Comprehensive metabolic panel, Lipid panel, POCT glycosylated hemoglobin (Hb A1C), Ambulatory referral to Ophthalmology  Obesity (BMI 30-39.9) - Plan: CBC with Differential/Platelet, Comprehensive metabolic panel, Lipid panel  Multiple somatic complaints  Hypertension associated with diabetes ([place]) - Plan: CBC with Differential/Platelet, Comprehensive metabolic panel  Asthma, chronic, mild persistent, uncomplicated  Hyperlipidemia associated with type 2 diabetes mellitus ([employer]) - Plan: Lipid panel  Chronic seasonal allergic rhinitis due to pollen  History of colonic polyps  Senile cataract, unspecified age-related cataract type, unspecified laterality - Plan: Ambulatory referral to Ophthalmology  Gastroesophageal reflux disease without esophagitis  Need for hepatitis C screening test - Plan: Hepatitis C antibody  Estrogen deficiency - Plan: DG Bone Density  Right hip pain - Plan: DG Hip Unilat W OR W/O Pelvis 2-3 Views Right  OAB (overactive bladder) - Plan: oxybutynin (DITROPAN XL) 5 MG 24 hr tablet, POCT Urinalysis DIP (Proadvantage Device), Urine Culture I will cancel the Ditropan until we have the culture result back.  She does need a bone density scan done.  She will continue on her other medications as indicated.  Also set her up to see ophthalmology.  Hip x-ray and follow-up pending results of that. Discussed diabetes medication with her however at this point she is not interested and will try harder with her diet especially cutting back on soft drinks.  Discussed monthly self breast exams and yearly mammograms; at least 30 minutes of aerobic activity at least 5 days/week and weight-bearing exercise 2x/week; proper sunscreen use reviewed; healthy diet, including goals of calcium and vitamin D intake and alcohol recommendations (  less than or equal to 1 drink/day) reviewed; regular seatbelt use; changing batteries in smoke detectors.  Immunization  recommendations discussed.  Colonoscopy recommendations reviewed   Medicare Attestation I have personally reviewed: The patient's medical and social history Their use of alcohol, tobacco or illicit drugs Their current medications and supplements The patient's functional ability including ADLs,fall risks, home safety risks, cognitive, and hearing and visual impairment Diet and physical activities Evidence for depression or mood disorders  The patient's weight, height, and BMI have been recorded in the chart.  I have made referrals, counseling, and provided education to the patient based on review of the above and I have provided the patient with a written personalized care plan for preventive services.   09/05/20   Jill Alexanders, MD   08/30/2020

## 2020-08-30 NOTE — Patient Instructions (Signed)
  Ms. [name] , Thank you for taking time to come for your Medicare Wellness Visit. I appreciate your ongoing commitment to your health goals. Please review the following plan we discussed and let me know if I can assist you in the future.   These are the goals we discussed: I want you out walking 20 minutes daily.  You can divide it up into four 5-minute walks if you want This is a list of the screening recommended for you and due dates:  Health Maintenance  Topic Date Due  .  Hepatitis C: One time screening is recommended by Center for Disease Control  (CDC) for  adults born from [birth year] through 1967.   Never done  . Complete foot exam   Never done  . Eye exam for diabetics  Never done  . DEXA scan (bone density measurement)  Never done  . Hemoglobin A1C  08/29/2020  . Flu Shot  02/28/2021*  . Colon Cancer Screening  07/01/2021  . Mammogram  08/13/2022  . Tetanus Vaccine  08/13/2022  . COVID-19 Vaccine  Completed  . Pneumonia vaccines  Discontinued  *Topic was postponed. The date shown is not the original due date.

## 2020-08-31 LAB — COMPREHENSIVE METABOLIC PANEL
ALT: 17 IU/L (ref 0–32)
AST: 12 IU/L (ref 0–40)
Albumin/Globulin Ratio: 1.8 (ref 1.2–2.2)
Albumin: 4.4 g/dL (ref 3.8–4.8)
Alkaline Phosphatase: 61 IU/L (ref 44–121)
BUN/Creatinine Ratio: 22 (ref 12–28)
BUN: 29 mg/dL — ABNORMAL HIGH (ref 8–27)
Bilirubin Total: 0.4 mg/dL (ref 0.0–1.2)
CO2: 22 mmol/L (ref 20–29)
Calcium: 9.5 mg/dL (ref 8.7–10.3)
Chloride: 104 mmol/L (ref 96–106)
Creatinine, Ser: 1.33 mg/dL — ABNORMAL HIGH (ref 0.57–1.00)
GFR calc Af Amer: 48 mL/min/{1.73_m2} — ABNORMAL LOW (ref >59)
GFR calc non Af Amer: 42 mL/min/{1.73_m2} — ABNORMAL LOW (ref >59)
Globulin, Total: 2.5 g/dL (ref 1.5–4.5)
Glucose: 144 mg/dL — ABNORMAL HIGH (ref 65–99)
Potassium: 5.1 mmol/L (ref 3.5–5.2)
Sodium: 139 mmol/L (ref 134–144)
Total Protein: 6.9 g/dL (ref 6.0–8.5)

## 2020-08-31 LAB — LIPID PANEL
Chol/HDL Ratio: 2.6 ratio (ref 0.0–4.4)
Cholesterol, Total: 144 mg/dL (ref 100–199)
HDL: 55 mg/dL (ref >39)
LDL Chol Calc (NIH): 71 mg/dL (ref 0–99)
Triglycerides: 100 mg/dL (ref 0–149)
VLDL Cholesterol Cal: 18 mg/dL (ref 5–40)

## 2020-08-31 LAB — CBC WITH DIFFERENTIAL/PLATELET
Basophils Absolute: 0.1 10*3/uL (ref 0.0–0.2)
Basos: 1 %
EOS (ABSOLUTE): 0.3 10*3/uL (ref 0.0–0.4)
Eos: 3 %
Hematocrit: 35.5 % (ref 34.0–46.6)
Hemoglobin: 12.1 g/dL (ref 11.1–15.9)
Immature Grans (Abs): 0 10*3/uL (ref 0.0–0.1)
Immature Granulocytes: 0 %
Lymphocytes Absolute: 1.9 10*3/uL (ref 0.7–3.1)
Lymphs: 24 %
MCH: 31.3 pg (ref 26.6–33.0)
MCHC: 34.1 g/dL (ref 31.5–35.7)
MCV: 92 fL (ref 79–97)
Monocytes Absolute: 0.7 10*3/uL (ref 0.1–0.9)
Monocytes: 9 %
Neutrophils Absolute: 4.9 10*3/uL (ref 1.4–7.0)
Neutrophils: 63 %
Platelets: 366 10*3/uL (ref 150–450)
RBC: 3.86 x10E6/uL (ref 3.77–5.28)
RDW: 12.2 % (ref 11.7–15.4)
WBC: 7.9 10*3/uL (ref 3.4–10.8)

## 2020-08-31 LAB — HEPATITIS C ANTIBODY: Hep C Virus Ab: <0.1 s/co ratio (ref 0.0–0.9)

## 2020-09-04 ENCOUNTER — Telehealth: Payer: Self-pay | Admitting: Family Medicine

## 2020-09-04 NOTE — Telephone Encounter (Signed)
Was advised to go have x ray at [facility].kh

## 2020-09-04 NOTE — Telephone Encounter (Signed)
Pt called and said she was supposed to go have her hip and leg xray and wanted to check the status of that.

## 2020-09-05 MED ORDER — SULFAMETHOXAZOLE-TRIMETHOPRIM 800-160 MG PO TABS: 1.0000 | ORAL_TABLET | Freq: Two times a day (BID) | ORAL | 0 refills | Status: DC

## 2020-09-05 NOTE — Addendum Note (Signed)
Addended by: [name] on: 09/05/2020 03:28 PM   Modules accepted: Orders

## 2020-09-06 ENCOUNTER — Ambulatory Visit
Admission: RE | Admit: 2020-09-06 | Discharge: 2020-09-06 | Disposition: A | Discharge status: Home / Self Care | Payer: Medicare Other | Source: Ambulatory Visit | Attending: Family Medicine | Admitting: Family Medicine

## 2020-09-06 ENCOUNTER — Ambulatory Visit
Admission: RE | Admit: 2020-09-06 | Discharge: 2020-09-06 | Disposition: A | Discharge status: Home / Self Care | Payer: Medicare Other | Source: Ambulatory Visit | Attending: Podiatry | Admitting: Podiatry

## 2020-09-06 DIAGNOSIS — M85871 Other specified disorders of bone density and structure, right ankle and foot: Secondary | ICD-10-CM | POA: Diagnosis not present

## 2020-09-06 DIAGNOSIS — M25551 Pain in right hip: Secondary | ICD-10-CM

## 2020-09-06 DIAGNOSIS — M19072 Primary osteoarthritis, left ankle and foot: Secondary | ICD-10-CM | POA: Diagnosis not present

## 2020-09-06 DIAGNOSIS — M79671 Pain in right foot: Secondary | ICD-10-CM | POA: Diagnosis not present

## 2020-09-06 DIAGNOSIS — L84 Corns and callosities: Secondary | ICD-10-CM

## 2020-09-06 DIAGNOSIS — Q667 Congenital pes cavus, unspecified foot: Secondary | ICD-10-CM

## 2020-09-06 DIAGNOSIS — M21961 Unspecified acquired deformity of right lower leg: Secondary | ICD-10-CM | POA: Diagnosis not present

## 2020-09-06 DIAGNOSIS — M1611 Unilateral primary osteoarthritis, right hip: Secondary | ICD-10-CM | POA: Diagnosis not present

## 2020-09-06 DIAGNOSIS — G8929 Other chronic pain: Secondary | ICD-10-CM | POA: Diagnosis not present

## 2020-09-06 DIAGNOSIS — M533 Sacrococcygeal disorders, not elsewhere classified: Secondary | ICD-10-CM | POA: Diagnosis not present

## 2020-09-06 LAB — URINE CULTURE

## 2020-09-10 DIAGNOSIS — J45998 Other asthma: Secondary | ICD-10-CM | POA: Diagnosis not present

## 2020-09-12 ENCOUNTER — Telehealth: Payer: Self-pay | Admitting: Family Medicine

## 2020-09-12 NOTE — Telephone Encounter (Signed)
Pt states we referred her to [facility] for bone density She contacted them but they can  Not see her until January.  She wants to know if we have anywhere else we can refer her. I told her that I would check with you but she may have the issue at another facility also. She states she has to have referral from pcp

## 2020-09-13 DIAGNOSIS — E119 Type 2 diabetes mellitus without complications: Secondary | ICD-10-CM | POA: Diagnosis not present

## 2020-09-13 NOTE — Telephone Encounter (Signed)
LVM advising pt that we like to have dexa and mammo done in the same place. Pt was advised she could call back if she had any other questions. [place]

## 2020-09-18 ENCOUNTER — Encounter: Payer: Self-pay | Admitting: Family Medicine

## 2020-09-18 ENCOUNTER — Ambulatory Visit (INDEPENDENT_AMBULATORY_CARE_PROVIDER_SITE_OTHER): Payer: Medicare Other | Admitting: Family Medicine

## 2020-09-18 ENCOUNTER — Other Ambulatory Visit: Payer: Self-pay

## 2020-09-18 VITALS — BP 108/72 | HR 71 | Temp 98.2°F | Wt 225.6 lb

## 2020-09-18 DIAGNOSIS — N3000 Acute cystitis without hematuria: Secondary | ICD-10-CM

## 2020-09-18 DIAGNOSIS — R609 Edema, unspecified: Secondary | ICD-10-CM | POA: Diagnosis not present

## 2020-09-18 LAB — POCT URINALYSIS DIP (PROADVANTAGE DEVICE)
Blood, UA: NEGATIVE
Glucose, UA: NEGATIVE mg/dL
Ketones, POC UA: NEGATIVE mg/dL
Leukocytes, UA: NEGATIVE
Nitrite, UA: NEGATIVE
Specific Gravity, Urine: 1.025
Urobilinogen, Ur: 0.2
pH, UA: 5.5 (ref 5.0–8.0)

## 2020-09-18 NOTE — Progress Notes (Signed)
   Subjective:    Patient ID: [name], female    DOB: Jun 19, 1956, 64 y.o.   MRN: [id number]  HPI She complains of a several month history of lower extremity edema especially when she sits for long periods of time.  She states that this does usually go away when she gets up in the morning.  No chest pain, shortness of breath, PND.  She also is here to have her urine rechecked.   Review of Systems     Objective:   Physical Exam Alert and in no distress.  Lower extremity exam shows 1-2+ pitting edema.  Is not warm or tender. Urinalysis is negative       Assessment & Plan:  Dependent edema  Acute cystitis without hematuria - Plan: POCT Urinalysis DIP (Proadvantage Device) Explained that dependent edema has to do with dangling her legs for long periods of time.  Recommend she elevate her feet as much as possible and also use support stockings.  She was comfortable with that.

## 2020-09-18 NOTE — Patient Instructions (Signed)
Edema  Edema is when you have too much fluid in your body or under your skin. Edema may make your legs, feet, and ankles swell up. Swelling is also common in looser tissues, like around your eyes. This is a common condition. It gets more common as you get older. There are many possible causes of edema. Eating too much salt (sodium) and being on your feet or sitting for a long time can cause edema in your legs, feet, and ankles. Hot weather may make edema worse. Edema is usually painless. Your skin may look swollen or shiny. Follow these instructions at home:  Keep the swollen body part raised (elevated) above the level of your heart when you are sitting or lying down.  Do not sit still or stand for a long time.  Do not wear tight clothes. Do not wear garters on your upper legs.  Exercise your legs. This can help the swelling go down.  Wear elastic bandages or support stockings as told by your doctor.  Eat a low-salt (low-sodium) diet to reduce fluid as told by your doctor.  Depending on the cause of your swelling, you may need to limit how much fluid you drink (fluid restriction).  Take over-the-counter and prescription medicines only as told by your doctor. Contact a doctor if:  Treatment is not working.  You have heart, liver, or kidney disease and have symptoms of edema.  You have sudden and unexplained weight gain. Get help right away if:  You have shortness of breath or chest pain.  You cannot breathe when you lie down.  You have pain, redness, or warmth in the swollen areas.  You have heart, liver, or kidney disease and get edema all of a sudden.  You have a fever and your symptoms get worse all of a sudden. Summary  Edema is when you have too much fluid in your body or under your skin.  Edema may make your legs, feet, and ankles swell up. Swelling is also common in looser tissues, like around your eyes.  Raise (elevate) the swollen body part above the level of your  heart when you are sitting or lying down.  Follow your doctor's instructions about diet and how much fluid you can drink (fluid restriction). This information is not intended to replace advice given to you by your health care provider. Make sure you discuss any questions you have with your health care provider. Document Revised: 11/20/2017 Document Reviewed: 12/05/2016 Elsevier Patient Education  2020 Elsevier Inc.  

## 2020-10-11 DIAGNOSIS — J45998 Other asthma: Secondary | ICD-10-CM | POA: Diagnosis not present

## 2020-10-19 ENCOUNTER — Encounter: Payer: Self-pay | Admitting: Family Medicine

## 2020-10-19 ENCOUNTER — Ambulatory Visit (INDEPENDENT_AMBULATORY_CARE_PROVIDER_SITE_OTHER): Payer: Medicare Other | Admitting: Family Medicine

## 2020-10-19 ENCOUNTER — Other Ambulatory Visit: Payer: Self-pay | Admitting: Family Medicine

## 2020-10-19 ENCOUNTER — Other Ambulatory Visit: Payer: Self-pay

## 2020-10-19 VITALS — BP 138/82 | HR 64 | Temp 98.3°F | Wt 235.8 lb

## 2020-10-19 DIAGNOSIS — R609 Edema, unspecified: Secondary | ICD-10-CM

## 2020-10-19 DIAGNOSIS — F17200 Nicotine dependence, unspecified, uncomplicated: Secondary | ICD-10-CM

## 2020-10-19 MED ORDER — FUROSEMIDE 20 MG PO TABS: ORAL_TABLET | ORAL | 1 refills | Status: DC

## 2020-10-19 NOTE — Progress Notes (Signed)
   Subjective:    Patient ID: [name], female    DOB: June 26, 1956, 64 y.o.   MRN: [id number]  HPI She continues to complain of lower extremity edema but no cough, congestion, chest pain, PND or DOE.  She has been keeping her legs elevated as much as she can but still runs into difficulty.  She has been using minimal support stockings. She is on lisinopril/HCTZ.  Review of Systems     Objective:   Physical Exam Alert and in no distress.  Exam of both lower extremities show 2-3+ pitting edema, left greater than right.  Negative Homans' sign.  It is not hot or tender.       Assessment & Plan:  Dependent edema - Plan: furosemide (LASIX) 20 MG tablet I will give her small doses of Lasix that she can use on an as-needed basis.  She expressed understanding of this. Follow-up in the next several months.

## 2020-10-22 NOTE — Telephone Encounter (Signed)
[facility] is requesting to fill pt ibuprofen . Please advise KH 

## 2020-10-29 DIAGNOSIS — J45998 Other asthma: Secondary | ICD-10-CM | POA: Diagnosis not present

## 2020-11-08 ENCOUNTER — Other Ambulatory Visit: Payer: Self-pay

## 2020-11-08 ENCOUNTER — Ambulatory Visit (INDEPENDENT_AMBULATORY_CARE_PROVIDER_SITE_OTHER): Payer: Medicare Other

## 2020-11-08 DIAGNOSIS — Z23 Encounter for immunization: Secondary | ICD-10-CM

## 2020-11-12 ENCOUNTER — Ambulatory Visit: Payer: Medicare Other | Admitting: Family Medicine

## 2020-11-13 ENCOUNTER — Other Ambulatory Visit: Payer: Self-pay | Admitting: Family Medicine

## 2020-11-13 DIAGNOSIS — E1159 Type 2 diabetes mellitus with other circulatory complications: Secondary | ICD-10-CM

## 2020-11-13 DIAGNOSIS — I152 Hypertension secondary to endocrine disorders: Secondary | ICD-10-CM

## 2020-11-15 ENCOUNTER — Ambulatory Visit (INDEPENDENT_AMBULATORY_CARE_PROVIDER_SITE_OTHER): Payer: Medicare Other | Admitting: Family Medicine

## 2020-11-15 ENCOUNTER — Encounter: Payer: Self-pay | Admitting: Family Medicine

## 2020-11-15 ENCOUNTER — Other Ambulatory Visit: Payer: Self-pay

## 2020-11-15 VITALS — BP 124/70 | HR 74 | Temp 99.0°F | Wt 231.2 lb

## 2020-11-15 DIAGNOSIS — R609 Edema, unspecified: Secondary | ICD-10-CM | POA: Diagnosis not present

## 2020-11-15 DIAGNOSIS — M25551 Pain in right hip: Secondary | ICD-10-CM | POA: Diagnosis not present

## 2020-11-15 NOTE — Progress Notes (Signed)
   Subjective:    Patient ID: [name], female    DOB: [birth year], 64 y.o.   MRN: [id number]  HPI She is here for a recheck.  She continues to complain of right hip pain in spite of anti-inflammatories.  She is now also having continued difficulty with left leg edema.  She has been using Lasix as well as most recently knee-high stockings.  She complains of left lateral calf discomfort.   Review of Systems     Objective:   Physical Exam Exam of the left lower extremity does show some minimal edema in a stocking glove pattern with complaint of pain to the left lateral lower calf area.  Bevelyn Buckles' sign is negative.  Skin is not hot red or tender.       Assessment & Plan:  Right hip pain  Dependent edema Keep using the knee-high stockings regularly.  Take 2 Tylenol regular strength 4 times per day for the pain .  You can also take ibuprofen as many as 3 tablets/day Follow-up pending hip x-ray.

## 2020-11-15 NOTE — Patient Instructions (Signed)
Keep using the knee-high stockings regularly.  Take 2 Tylenol regular strength 4 times per day for the pain .  You can also take ibuprofen as many as 3 tablets/day

## 2020-11-27 DIAGNOSIS — Z20822 Contact with and (suspected) exposure to covid-19: Secondary | ICD-10-CM | POA: Diagnosis not present

## 2020-12-04 ENCOUNTER — Other Ambulatory Visit: Payer: Self-pay | Admitting: Family Medicine

## 2020-12-04 DIAGNOSIS — J4530 Mild persistent asthma, uncomplicated: Secondary | ICD-10-CM

## 2020-12-04 NOTE — Telephone Encounter (Signed)
[facility] is requesting to fill pt pro air . kh

## 2020-12-05 ENCOUNTER — Ambulatory Visit
Admission: RE | Admit: 2020-12-05 | Discharge: 2020-12-05 | Disposition: A | Discharge status: Home / Self Care | Payer: Medicare Other | Source: Ambulatory Visit | Attending: Family Medicine | Admitting: Family Medicine

## 2020-12-05 DIAGNOSIS — M533 Sacrococcygeal disorders, not elsewhere classified: Secondary | ICD-10-CM | POA: Diagnosis not present

## 2020-12-05 DIAGNOSIS — M79604 Pain in right leg: Secondary | ICD-10-CM | POA: Diagnosis not present

## 2020-12-05 DIAGNOSIS — M1611 Unilateral primary osteoarthritis, right hip: Secondary | ICD-10-CM | POA: Diagnosis not present

## 2020-12-06 ENCOUNTER — Other Ambulatory Visit: Payer: Self-pay

## 2020-12-06 DIAGNOSIS — M25551 Pain in right hip: Secondary | ICD-10-CM

## 2020-12-10 DIAGNOSIS — J45998 Other asthma: Secondary | ICD-10-CM | POA: Diagnosis not present

## 2020-12-18 ENCOUNTER — Ambulatory Visit: Payer: Medicare Other | Admitting: Orthopaedic Surgery

## 2020-12-18 DIAGNOSIS — J45998 Other asthma: Secondary | ICD-10-CM | POA: Diagnosis not present

## 2020-12-25 ENCOUNTER — Ambulatory Visit (INDEPENDENT_AMBULATORY_CARE_PROVIDER_SITE_OTHER): Payer: Medicare Other | Admitting: Orthopaedic Surgery

## 2020-12-25 ENCOUNTER — Encounter: Payer: Self-pay | Admitting: Orthopaedic Surgery

## 2020-12-25 ENCOUNTER — Other Ambulatory Visit: Payer: Self-pay

## 2020-12-25 ENCOUNTER — Ambulatory Visit: Payer: Self-pay

## 2020-12-25 DIAGNOSIS — M545 Low back pain, unspecified: Secondary | ICD-10-CM

## 2020-12-25 MED ORDER — METHOCARBAMOL 500 MG PO TABS: 500.0000 mg | ORAL_TABLET | Freq: Two times a day (BID) | ORAL | 0 refills | Status: DC | PRN

## 2020-12-25 MED ORDER — PREDNISONE 10 MG (21) PO TBPK: ORAL_TABLET | ORAL | 0 refills | Status: DC

## 2020-12-25 NOTE — Progress Notes (Signed)
Office Visit Note   Patient: [name]           Date of Birth: 06-04-1956           MRN: [id number] Visit Date: 12/25/2020              Requested by: [name], MD [address] [place],  [place] [address] PCP: [name], MD   Assessment & Plan: Visit Diagnoses:  1. Low back pain, unspecified back pain laterality, unspecified chronicity, unspecified whether sciatica present     Plan: Impression is chronic bilateral lower back pain with bilateral lower extremity radiculopathy.  At this point, have started the patient on a steroid taper muscle relaxer.  We will also refer her to physical therapy.  If her symptoms have not improved over the next several weeks she will let us know we will likely order an MRI of her lumbar spine to further evaluate for structural abnormalities.  Follow-Up Instructions: Return if symptoms worsen or fail to improve.   Orders:  Orders Placed This Encounter  Procedures  . XR Lumbar Spine 2-3 Views  . Ambulatory referral to Physical Therapy   Meds ordered this encounter  Medications  . predniSONE (STERAPRED UNI-PAK 21 TAB) 10 MG (21) TBPK tablet    Sig: Take as directed    Dispense:  21 tablet    Refill:  0  . methocarbamol (ROBAXIN) 500 MG tablet    Sig: Take 1 tablet (500 mg total) by mouth 2 (two) times daily as needed.    Dispense:  20 tablet    Refill:  0      Procedures: No procedures performed   Clinical Data: No additional findings.   Subjective: Chief Complaint  Patient presents with  . Right Hip - Pain  . Lower Back - Pain    HPI patient is a pleasant 65 year old female who comes in today with bilateral lower back pain radiating to the anterior thighs into the knee.  Both sides are equally as bad.  She notes a feeling of pressure to the area and associated weakness in both legs.  Her pain is increased going from a seated to standing position as well as with ambulation and standing or sitting for  long period of time.  She has been taking ibuprofen without significant relief of symptoms.  She does note occasional numbness and tingling in both feet.  She notes a previous history of back pathology but no previous epidural steroid injection or surgery.  She denies any bowel or bladder incontinence or saddle paresthesias.  Review of Systems as detailed in HPI.  All others reviewed and are negative.   Objective: Vital Signs: There were no vitals taken for this visit.  Physical Exam well-developed well-nourished female no acute distress.  Alert and oriented x3.  Ortho Exam lumbar exam shows no spinous tenderness.  She does have mild paraspinous tenderness to both sides.  She has increased pain with lumbar extension.  No pain with flexion.  Negative straight leg raise both sides.  She has limited range of motion with logroll but no pain.  No focal weakness.  She is neurovascular intact distally.  Specialty Comments:  No specialty comments available.  Imaging: XR Lumbar Spine 2-3 Views  Result Date: 12/25/2020 Multilevel spondylosis worse at L5-S1 where there is an anterior listhesis    PMFS History: Patient Active Problem List   Diagnosis Date Noted  . Cataract, age-related 04/27/2020  . Multiple somatic complaints  01/10/2019  . Adenomatous polyp of colon 05/25/2018  . History of colonic polyps 03/06/2017  . Asthma, chronic, mild persistent, uncomplicated 24/08/7352  . Chronic seasonal allergic rhinitis due to pollen 03/06/2017  . Gastroesophageal reflux disease without esophagitis 10/10/2014  . Ex-cigarette smoker 09/28/2013  . Type 2 diabetes mellitus with complications ([place]) 29/92/4268  . Obesity (BMI 30-39.9) 07/12/2012  . Chronic back pain 07/12/2012  . Hypertension associated with diabetes ([place]) 07/12/2012   Past Medical History:  Diagnosis Date  . Arthritis   . Asthma   . Colonic polyp   . GERD (gastroesophageal reflux disease)   . Hypertension   . Obesity   .  Pain in limb   . Smoker   . Swelling of limb     Family History  Problem Relation Age of Onset  . Lung cancer Father   . Cirrhosis Mother   . Cirrhosis Brother   . Colon cancer Neg Hx   . Colon polyps Neg Hx   . Esophageal cancer Neg Hx   . Rectal cancer Neg Hx   . Stomach cancer Neg Hx     Past Surgical History:  Procedure Laterality Date  . BIOPSY BREAST Left 09/22/2018   no maglignancy  . COLONOSCOPY  2009   Dr. Henrene Pastor  . POLYPECTOMY     Social History   Occupational History  . Not on file  Tobacco Use  . Smoking status: Former Smoker    Packs/day: 0.25    Years: 10.00    Pack years: 2.50    Types: Cigarettes  . Smokeless tobacco: Never Used  . Tobacco comment: quit oct 2018  Vaping Use  . Vaping Use: Never used  Substance and Sexual Activity  . Alcohol use: Yes    Comment: socially  . Drug use: Yes    Types: Marijuana    Comment: social  . Sexual activity: Not Currently

## 2020-12-28 ENCOUNTER — Other Ambulatory Visit: Payer: Medicare Other

## 2020-12-30 ENCOUNTER — Other Ambulatory Visit: Payer: Self-pay | Admitting: Physician Assistant

## 2020-12-31 ENCOUNTER — Telehealth: Payer: Self-pay | Admitting: Orthopaedic Surgery

## 2020-12-31 NOTE — Telephone Encounter (Signed)
Pt called and has questions concerning her back and she says she in a lot of pain. Said something about asking for stronger medication for the pain because she is out of what was prescribed. CB [phone].. wanted to talk to lis

## 2021-01-01 ENCOUNTER — Other Ambulatory Visit: Payer: Self-pay

## 2021-01-01 ENCOUNTER — Telehealth: Payer: Self-pay | Admitting: Physician Assistant

## 2021-01-01 ENCOUNTER — Other Ambulatory Visit: Payer: Self-pay | Admitting: Physician Assistant

## 2021-01-01 DIAGNOSIS — M545 Low back pain, unspecified: Secondary | ICD-10-CM

## 2021-01-01 MED ORDER — DICLOFENAC SODIUM 75 MG PO TBEC: 75.0000 mg | DELAYED_RELEASE_TABLET | Freq: Two times a day (BID) | ORAL | 0 refills | Status: DC | PRN

## 2021-01-01 MED ORDER — TRAMADOL HCL 50 MG PO TABS: 50.0000 mg | ORAL_TABLET | Freq: Three times a day (TID) | ORAL | 0 refills | Status: DC | PRN

## 2021-01-01 NOTE — Telephone Encounter (Signed)
Patient aware.

## 2021-01-01 NOTE — Telephone Encounter (Signed)
Patient called for an update on call back about pain with PA Stanbery. Patient is asking for a call back before refill is sent in. Patient phone number [phone].

## 2021-01-01 NOTE — Telephone Encounter (Signed)
Sent in tramadol and diclofenac.  Can also get mri lumbar spine

## 2021-01-15 ENCOUNTER — Ambulatory Visit: Payer: Medicare Other

## 2021-01-20 ENCOUNTER — Ambulatory Visit
Admission: RE | Admit: 2021-01-20 | Discharge: 2021-01-20 | Disposition: A | Discharge status: Home / Self Care | Payer: Medicare Other | Source: Ambulatory Visit | Attending: Orthopaedic Surgery | Admitting: Orthopaedic Surgery

## 2021-01-20 ENCOUNTER — Other Ambulatory Visit: Payer: Self-pay

## 2021-01-20 DIAGNOSIS — M48061 Spinal stenosis, lumbar region without neurogenic claudication: Secondary | ICD-10-CM | POA: Diagnosis not present

## 2021-01-20 DIAGNOSIS — M545 Low back pain, unspecified: Secondary | ICD-10-CM

## 2021-01-21 NOTE — Progress Notes (Signed)
Needs appt

## 2021-01-22 ENCOUNTER — Ambulatory Visit (INDEPENDENT_AMBULATORY_CARE_PROVIDER_SITE_OTHER): Payer: Medicare Other | Admitting: Family Medicine

## 2021-01-22 ENCOUNTER — Other Ambulatory Visit: Payer: Self-pay

## 2021-01-22 ENCOUNTER — Encounter: Payer: Self-pay | Admitting: Family Medicine

## 2021-01-22 ENCOUNTER — Ambulatory Visit: Payer: Medicare Other | Admitting: Family Medicine

## 2021-01-22 VITALS — BP 168/80 | HR 77 | Temp 99.0°F | Wt 240.0 lb

## 2021-01-22 DIAGNOSIS — R609 Edema, unspecified: Secondary | ICD-10-CM | POA: Diagnosis not present

## 2021-01-22 DIAGNOSIS — N1831 Chronic kidney disease, stage 3a: Secondary | ICD-10-CM | POA: Diagnosis not present

## 2021-01-22 MED ORDER — FUROSEMIDE 40 MG PO TABS: 40.0000 mg | ORAL_TABLET | Freq: Every day | ORAL | 3 refills | Status: DC

## 2021-01-22 NOTE — Patient Instructions (Signed)
Continue on all your medications and elbow Lasix come back here in 2 weeks.  Keep your legs elevated as much as possible and continue to wear those support stockings

## 2021-01-22 NOTE — Progress Notes (Signed)
   Subjective:    Patient ID: [name], female    DOB: 1956-06-15, 65 y.o.   MRN: [id number]  HPI She is here for recheck.  She continues have difficulty with dependent edema.  She has been using stockings but has been unable to elevate her feet as much as possible.  She ran out of Lasix.   Review of Systems     Objective:   Physical Exam Exam of both lower extremities does show 3+ pitting edema with the left side worse with distal peau d'orange       Assessment & Plan:  Dependent edema - Plan: CBC with Differential/Platelet, Comprehensive metabolic panel, furosemide (LASIX) 40 MG tablet I will check her potassium levels, increase the Lasix, recommend keeping her feet elevated as much as possible as well as using support stockings.  Recheck here 2 weeks.

## 2021-01-23 DIAGNOSIS — N1831 Chronic kidney disease, stage 3a: Secondary | ICD-10-CM | POA: Insufficient documentation

## 2021-01-23 LAB — CBC WITH DIFFERENTIAL/PLATELET
Basophils Absolute: 0 10*3/uL (ref 0.0–0.2)
Basos: 0 %
EOS (ABSOLUTE): 0.5 10*3/uL — ABNORMAL HIGH (ref 0.0–0.4)
Eos: 6 %
Hematocrit: 42 % (ref 34.0–46.6)
Hemoglobin: 13.9 g/dL (ref 11.1–15.9)
Immature Grans (Abs): 0 10*3/uL (ref 0.0–0.1)
Immature Granulocytes: 0 %
Lymphocytes Absolute: 2.5 10*3/uL (ref 0.7–3.1)
Lymphs: 30 %
MCH: 28.7 pg (ref 26.6–33.0)
MCHC: 33.1 g/dL (ref 31.5–35.7)
MCV: 87 fL (ref 79–97)
Monocytes Absolute: 0.7 10*3/uL (ref 0.1–0.9)
Monocytes: 8 %
Neutrophils Absolute: 4.5 10*3/uL (ref 1.4–7.0)
Neutrophils: 56 %
Platelets: 269 10*3/uL (ref 150–450)
RBC: 4.84 x10E6/uL (ref 3.77–5.28)
RDW: 13.6 % (ref 11.7–15.4)
WBC: 8.1 10*3/uL (ref 3.4–10.8)

## 2021-01-23 LAB — COMPREHENSIVE METABOLIC PANEL
ALT: 29 IU/L (ref 0–32)
AST: 17 IU/L (ref 0–40)
Albumin/Globulin Ratio: 1.7 (ref 1.2–2.2)
Albumin: 4.4 g/dL (ref 3.8–4.8)
Alkaline Phosphatase: 75 IU/L (ref 44–121)
BUN/Creatinine Ratio: 19 (ref 12–28)
BUN: 24 mg/dL (ref 8–27)
Bilirubin Total: 0.3 mg/dL (ref 0.0–1.2)
CO2: 22 mmol/L (ref 20–29)
Calcium: 9.5 mg/dL (ref 8.7–10.3)
Chloride: 104 mmol/L (ref 96–106)
Creatinine, Ser: 1.27 mg/dL — ABNORMAL HIGH (ref 0.57–1.00)
GFR calc Af Amer: 51 mL/min/{1.73_m2} — ABNORMAL LOW (ref >59)
GFR calc non Af Amer: 44 mL/min/{1.73_m2} — ABNORMAL LOW (ref >59)
Globulin, Total: 2.6 g/dL (ref 1.5–4.5)
Glucose: 172 mg/dL — ABNORMAL HIGH (ref 65–99)
Potassium: 4.2 mmol/L (ref 3.5–5.2)
Sodium: 145 mmol/L — ABNORMAL HIGH (ref 134–144)
Total Protein: 7 g/dL (ref 6.0–8.5)

## 2021-01-27 ENCOUNTER — Other Ambulatory Visit: Payer: Self-pay | Admitting: Family Medicine

## 2021-01-27 DIAGNOSIS — J4530 Mild persistent asthma, uncomplicated: Secondary | ICD-10-CM

## 2021-01-28 NOTE — Telephone Encounter (Signed)
Is this okay to refill? 

## 2021-01-29 ENCOUNTER — Ambulatory Visit (INDEPENDENT_AMBULATORY_CARE_PROVIDER_SITE_OTHER): Payer: Medicare Other | Admitting: Orthopaedic Surgery

## 2021-01-29 ENCOUNTER — Other Ambulatory Visit: Payer: Self-pay

## 2021-01-29 DIAGNOSIS — M5416 Radiculopathy, lumbar region: Secondary | ICD-10-CM

## 2021-01-29 MED ORDER — ACETAMINOPHEN-CODEINE #3 300-30 MG PO TABS: 1.0000 | ORAL_TABLET | Freq: Four times a day (QID) | ORAL | 0 refills | Status: DC | PRN

## 2021-01-29 NOTE — Progress Notes (Signed)
Office Visit Note   Patient: [name]           Date of Birth: 11-20-1956           MRN: [id number] Visit Date: 01/29/2021              Requested by: [name], MD [place],  [place] [address] PCP: [name], MD   Assessment & Plan: Visit Diagnoses:  1. Radiculopathy, lumbar region     Plan: Impression is L4-5: Moderate bulging of the disc, lateral recess and foraminal stenoses right greater than left.  L5-S1: Chronic disc degeneration with disc height loss and endplate osteophytes and bulging of the disc.  There is also facet and ligamentous hypertrophy and bilateral foraminal stenosis right more than left.  Also noted is facet arthritis at both L4-5 and L5-S1.  We have discussed referral to physical therapy as well as epidural steroid injection versus facet block by Dr. Ernestina Patches.  She would like to try a course of physical therapy first.  Should her symptoms continue or worsen she will let us know we will make the referral to Dr. Ernestina Patches for injection.  Otherwise, follow-up with [place] as needed.  Follow-Up Instructions: Return if symptoms worsen or fail to improve.   Orders:  No orders of the defined types were placed in this encounter.  Meds ordered this encounter  Medications  . acetaminophen-codeine (TYLENOL #3) 300-30 MG tablet    Sig: Take 1 tablet by mouth every 6 (six) hours as needed for moderate pain.    Dispense:  30 tablet    Refill:  0      Procedures: No procedures performed   Clinical Data: No additional findings.   Subjective: Chief Complaint  Patient presents with  . Other     Scan review    HPI patient is a very pleasant 65 year old female who comes in today to discuss MRI results of her lumbar spine.  She was seen by [place] recently for this where she was getting right-sided lower back pain and bilateral lower extremity radiculopathy.  She was provided with a prednisone taper and muscle relaxer which did help  during the week that she was on the steroids.  Her pain returned and significantly worsened so subsequent MRI of the lumbar spine was ordered.  MRI of the lumbar spine shows L4-5: Moderate bulging of the disc, lateral recess and foraminal stenoses right greater than left.  L5-S1: Chronic disc degeneration with disc height loss and endplate osteophytes and bulging of the disc.  There is also facet and ligamentous hypertrophy and bilateral foraminal stenosis right more than left.  Also noted is facet arthritis at both L4-5 and L5-S1.  The patient has not previously tried physical therapy as she wanted to have the MRI first.  She has not had a previous epidural steroid injection or facet block in the past.     Objective: Vital Signs: There were no vitals taken for this visit.    Ortho Exam stable lumbar exam  Specialty Comments:  No specialty comments available.  Imaging: No new imaging   PMFS History: Patient Active Problem List   Diagnosis Date Noted  . Stage 3a chronic kidney disease ([place]) 01/23/2021  . Cataract, age-related 04/27/2020  . Multiple somatic complaints 01/10/2019  . Adenomatous polyp of colon 05/25/2018  . History of colonic polyps 03/06/2017  . Asthma, chronic, mild persistent, uncomplicated 62/83/6629  . Chronic seasonal allergic rhinitis due to pollen  03/06/2017  . Gastroesophageal reflux disease without esophagitis 10/10/2014  . Ex-cigarette smoker 09/28/2013  . Type 2 diabetes mellitus with complications ([place]) 84/66/5993  . Obesity (BMI 30-39.9) 07/12/2012  . Chronic back pain 07/12/2012  . Hypertension associated with diabetes ([place]) 07/12/2012   Past Medical History:  Diagnosis Date  . Arthritis   . Asthma   . Colonic polyp   . GERD (gastroesophageal reflux disease)   . Hypertension   . Obesity   . Pain in limb   . Smoker   . Swelling of limb     Family History  Problem Relation Age of Onset  . Lung cancer Father   . Cirrhosis Mother   .  Cirrhosis Brother   . Colon cancer Neg Hx   . Colon polyps Neg Hx   . Esophageal cancer Neg Hx   . Rectal cancer Neg Hx   . Stomach cancer Neg Hx     Past Surgical History:  Procedure Laterality Date  . BIOPSY BREAST Left 09/22/2018   no maglignancy  . COLONOSCOPY  2009   Dr. Henrene Pastor  . POLYPECTOMY     Social History   Occupational History  . Not on file  Tobacco Use  . Smoking status: Former Smoker    Packs/day: 0.25    Years: 10.00    Pack years: 2.50    Types: Cigarettes  . Smokeless tobacco: Never Used  . Tobacco comment: quit oct 2018  Vaping Use  . Vaping Use: Never used  Substance and Sexual Activity  . Alcohol use: Yes    Comment: socially  . Drug use: Yes    Types: Marijuana    Comment: social  . Sexual activity: Not Currently

## 2021-01-31 ENCOUNTER — Other Ambulatory Visit: Payer: Self-pay

## 2021-01-31 ENCOUNTER — Encounter: Payer: Self-pay | Admitting: Family Medicine

## 2021-01-31 ENCOUNTER — Ambulatory Visit (INDEPENDENT_AMBULATORY_CARE_PROVIDER_SITE_OTHER): Payer: Medicare Other | Admitting: Family Medicine

## 2021-01-31 VITALS — BP 142/82 | HR 79 | Temp 98.9°F | Wt 234.4 lb

## 2021-01-31 DIAGNOSIS — R609 Edema, unspecified: Secondary | ICD-10-CM

## 2021-01-31 DIAGNOSIS — E118 Type 2 diabetes mellitus with unspecified complications: Secondary | ICD-10-CM | POA: Diagnosis not present

## 2021-01-31 DIAGNOSIS — M5416 Radiculopathy, lumbar region: Secondary | ICD-10-CM

## 2021-01-31 LAB — POCT GLYCOSYLATED HEMOGLOBIN (HGB A1C): Hemoglobin A1C: 7.8 % — AB (ref 4.0–5.6)

## 2021-01-31 NOTE — Progress Notes (Signed)
   Subjective:    Patient ID: [name], female    DOB: Oct 10, 1956, 65 y.o.   MRN: [id number]  HPI She is here for recheck.  She has made some dietary changes as well as taking her Lasix.  Her weight is down.  She has had less swelling in her Lower extremities and is quite happy with that.  Her last A1c was in September. She was recently seen by orthopedics for her lumbar radiculopathy.  She is getting physical therapy and is considering getting the epidurals. Review of Systems     Objective:   Physical Exam  Alert and in no distress.  Exam of her extremities does show much less edema.  Hemoglobin A1c is 7.8      Assessment & Plan:  Dependent edema  Type 2 diabetes mellitus with complications (HCC) - Plan: POCT UA - Microalbumin, POCT glycosylated hemoglobin (Hb A1C)  Lumbar radiculopathy Recommend she use the Lasix on an as-needed basis.  Explained that her diabetes is not terrible but needs to be under better control.  Discussed the back pain she is having and encouraged her to continue with physical therapy and consider getting the epidural injections.

## 2021-02-08 ENCOUNTER — Other Ambulatory Visit: Payer: Self-pay | Admitting: Family Medicine

## 2021-02-08 DIAGNOSIS — I152 Hypertension secondary to endocrine disorders: Secondary | ICD-10-CM

## 2021-02-08 DIAGNOSIS — F17200 Nicotine dependence, unspecified, uncomplicated: Secondary | ICD-10-CM

## 2021-02-08 DIAGNOSIS — E1159 Type 2 diabetes mellitus with other circulatory complications: Secondary | ICD-10-CM

## 2021-02-08 NOTE — Telephone Encounter (Signed)
[facility] is requesting to fill pt ibuprofen . Please advise [facility]

## 2021-03-08 ENCOUNTER — Other Ambulatory Visit: Payer: Self-pay | Admitting: Family Medicine

## 2021-03-08 DIAGNOSIS — J4530 Mild persistent asthma, uncomplicated: Secondary | ICD-10-CM

## 2021-03-08 NOTE — Telephone Encounter (Signed)
[facility] is requesting to fill pt albuterol inhaler. Please advise [facility]

## 2021-03-10 ENCOUNTER — Other Ambulatory Visit: Payer: Self-pay | Admitting: Family Medicine

## 2021-03-11 DIAGNOSIS — J45998 Other asthma: Secondary | ICD-10-CM | POA: Diagnosis not present

## 2021-03-19 ENCOUNTER — Other Ambulatory Visit: Payer: Medicare Other

## 2021-03-21 ENCOUNTER — Ambulatory Visit: Payer: Self-pay | Admitting: Family Medicine

## 2021-03-27 ENCOUNTER — Ambulatory Visit (INDEPENDENT_AMBULATORY_CARE_PROVIDER_SITE_OTHER): Payer: Medicare Other | Admitting: Family Medicine

## 2021-03-27 ENCOUNTER — Encounter: Payer: Self-pay | Admitting: Family Medicine

## 2021-03-27 ENCOUNTER — Other Ambulatory Visit: Payer: Self-pay

## 2021-03-27 VITALS — BP 148/70 | HR 70 | Temp 98.0°F | Ht 65.0 in | Wt 233.8 lb

## 2021-03-27 DIAGNOSIS — R609 Edema, unspecified: Secondary | ICD-10-CM

## 2021-03-27 DIAGNOSIS — N1831 Chronic kidney disease, stage 3a: Secondary | ICD-10-CM

## 2021-03-27 DIAGNOSIS — E118 Type 2 diabetes mellitus with unspecified complications: Secondary | ICD-10-CM

## 2021-03-27 LAB — POCT GLYCOSYLATED HEMOGLOBIN (HGB A1C): Hemoglobin A1C: 7.8 % — AB (ref 4.0–5.6)

## 2021-03-27 MED ORDER — METFORMIN HCL 500 MG PO TABS: 500.0000 mg | ORAL_TABLET | Freq: Two times a day (BID) | ORAL | 1 refills | Status: DC

## 2021-03-27 NOTE — Patient Instructions (Signed)
Use the support hose and to continue with the fluid pill and we will get that test to see what it looks like

## 2021-03-27 NOTE — Progress Notes (Signed)
   Subjective:    Patient ID: [name], [name]    DOB: December 07, 1955, 65 y.o.   MRN: [id number]  HPI She is here for consult concerning her diabetes.  Review of the record indicates in the past she had lost a significant amount of weight and her A1c came came down.  Metformin was stopped however since then she has gained the weight back and her A1c has slowly increased.  She admits to not being able to do the diet and exercise the way she would like.  She continues to complain of bilateral leg pain and swelling.  Also of note is the fact that she does have CKD.   Review of Systems     Objective:   Physical Exam Exam of both lower extremities does show 2-3+ edema.  Difficult to assess pulses due to the edema.       Assessment & Plan:  Dependent edema - Plan: VAS [place] ABI WITH/WO TBI  Type 2 diabetes mellitus with complications ([place]) - Plan: metFORMIN (GLUCOPHAGE) 500 MG tablet, VAS [place] ABI WITH/WO TBI, POCT glycosylated hemoglobin (Hb A1C)  Stage 3a chronic kidney disease ([place]) I explained that we will need to look into her lower extremity edema from a vascular perspective but also recognize that using support stockings and Lasix is not unreasonable.  We will follow-up pending results of this.  Did again instructed her to be as active as she possibly can.  I will place her back on metformin and need to monitor this closely as her kidney function is borderline.

## 2021-04-02 ENCOUNTER — Ambulatory Visit (HOSPITAL_COMMUNITY)
Admission: RE | Admit: 2021-04-02 | Discharge: 2021-04-02 | Disposition: A | Discharge status: Home / Self Care | Payer: Medicare Other | Source: Ambulatory Visit | Attending: Family Medicine | Admitting: Family Medicine

## 2021-04-02 ENCOUNTER — Other Ambulatory Visit: Payer: Self-pay

## 2021-04-02 DIAGNOSIS — E118 Type 2 diabetes mellitus with unspecified complications: Secondary | ICD-10-CM | POA: Diagnosis not present

## 2021-04-02 DIAGNOSIS — R609 Edema, unspecified: Secondary | ICD-10-CM | POA: Diagnosis not present

## 2021-04-10 ENCOUNTER — Other Ambulatory Visit: Payer: Self-pay | Admitting: Family Medicine

## 2021-04-10 DIAGNOSIS — J4530 Mild persistent asthma, uncomplicated: Secondary | ICD-10-CM

## 2021-04-11 NOTE — Telephone Encounter (Signed)
[facility] is requesting to fill pt inahler albuterol. Please advise [facility]

## 2021-04-12 ENCOUNTER — Other Ambulatory Visit: Payer: Self-pay | Admitting: Family Medicine

## 2021-04-12 NOTE — Telephone Encounter (Signed)
[facility] is requesting to fill pt trelegy please advise [facility]

## 2021-05-07 DIAGNOSIS — J45998 Other asthma: Secondary | ICD-10-CM | POA: Diagnosis not present

## 2021-05-10 ENCOUNTER — Other Ambulatory Visit: Payer: Self-pay | Admitting: Family Medicine

## 2021-05-10 DIAGNOSIS — I152 Hypertension secondary to endocrine disorders: Secondary | ICD-10-CM

## 2021-05-10 DIAGNOSIS — J4530 Mild persistent asthma, uncomplicated: Secondary | ICD-10-CM

## 2021-05-10 DIAGNOSIS — E1159 Type 2 diabetes mellitus with other circulatory complications: Secondary | ICD-10-CM

## 2021-05-10 NOTE — Telephone Encounter (Signed)
[facility] is requesting to fill pt pro air please advise [facility]

## 2021-05-11 DIAGNOSIS — J45998 Other asthma: Secondary | ICD-10-CM | POA: Diagnosis not present

## 2021-05-16 LAB — HM DIABETES EYE EXAM

## 2021-05-21 ENCOUNTER — Other Ambulatory Visit: Payer: Self-pay | Admitting: Family Medicine

## 2021-05-21 ENCOUNTER — Other Ambulatory Visit: Payer: Self-pay

## 2021-05-21 NOTE — Telephone Encounter (Signed)
[facility] is requesting to fill pt trelegy. Please advise [facility] 

## 2021-05-23 ENCOUNTER — Ambulatory Visit (INDEPENDENT_AMBULATORY_CARE_PROVIDER_SITE_OTHER): Payer: Medicare Other | Admitting: Family Medicine

## 2021-05-23 ENCOUNTER — Other Ambulatory Visit: Payer: Self-pay

## 2021-05-23 VITALS — BP 126/72 | HR 71 | Temp 98.4°F | Wt 231.4 lb

## 2021-05-23 DIAGNOSIS — J4530 Mild persistent asthma, uncomplicated: Secondary | ICD-10-CM

## 2021-05-23 DIAGNOSIS — M5416 Radiculopathy, lumbar region: Secondary | ICD-10-CM | POA: Diagnosis not present

## 2021-05-23 NOTE — Progress Notes (Signed)
   Subjective:    Patient ID: [name], female    DOB: 12/21/1955, 65 y.o.   MRN: [id number]  HPI She is here to get a transportation form filled out to allow her to get to various medical offices.  She states that her breathing and back pain keeps her from doing this.  She then stated that she is using the albuterol rescue inhaler at least once a day and sometimes 2 or 3 times per day as well as her Trelegy.  After I quizzed her concerning this she then changed and said that she was not using it that often when I explained that that indicated that she was not under good control.  She does state that walking more than several blocks would cause her to become short of breath.  I explained that using the rescue inhaler more than twice per week was indicative of not under good control.  She then made it sound as if she is not using it even that often.  Every time I discussed her asthma being under control, she changed her history. She then stated that her back pain keeps her from being mobile and that she needs the transportation for that as well.  Review of the record indicates she was seen by orthopedics and recommended physical therapy.  She has not followed up with physical therapy.  I also mention the possibility of epidural injections which at this point she says she does not think will work.   Review of Systems     Objective:   Physical Exam  Alert and in no distress otherwise not examined      Assessment & Plan:   Lumbar radiculopathy  Asthma, chronic, mild persistent, uncomplicated - Plan: Ambulatory referral to Pulmonology I explained that I could give her a temporary permit until we get that back pain and it lung issue under control and therefore not need special transportation.  As evidenced by the fact that she gave excuses concerning her asthma and her back pain, I think she is really not interested in anything other than being placed on a permanent disability to get  better transportation.  Over 30 minutes spent discussing all of these issues with her.

## 2021-05-24 ENCOUNTER — Telehealth: Payer: Self-pay | Admitting: Orthopaedic Surgery

## 2021-05-24 DIAGNOSIS — M5416 Radiculopathy, lumbar region: Secondary | ICD-10-CM

## 2021-05-24 NOTE — Addendum Note (Signed)
Addended by: Meyer Cory on: 05/24/2021 01:10 PM   Modules accepted: Orders

## 2021-05-24 NOTE — Telephone Encounter (Signed)
New referral entered 

## 2021-05-24 NOTE — Telephone Encounter (Signed)
Yes, thank you.

## 2021-05-24 NOTE — Telephone Encounter (Signed)
[name] need a [facility] referral for pt.   CB [phone]

## 2021-05-24 NOTE — Telephone Encounter (Signed)
Ok for new referral for PT low back?

## 2021-05-27 ENCOUNTER — Encounter: Payer: Self-pay | Admitting: Physical Therapy

## 2021-05-27 ENCOUNTER — Encounter: Payer: Self-pay | Admitting: Family Medicine

## 2021-05-27 ENCOUNTER — Other Ambulatory Visit: Payer: Self-pay

## 2021-05-27 ENCOUNTER — Ambulatory Visit: Payer: Medicare Other | Attending: Physician Assistant | Admitting: Physical Therapy

## 2021-05-27 DIAGNOSIS — G8929 Other chronic pain: Secondary | ICD-10-CM | POA: Insufficient documentation

## 2021-05-27 DIAGNOSIS — R293 Abnormal posture: Secondary | ICD-10-CM

## 2021-05-27 DIAGNOSIS — R252 Cramp and spasm: Secondary | ICD-10-CM | POA: Diagnosis not present

## 2021-05-27 DIAGNOSIS — M545 Low back pain, unspecified: Secondary | ICD-10-CM | POA: Insufficient documentation

## 2021-05-27 NOTE — Therapy (Signed)
[place], [place], [address] Phone: [phone]   Fax:  [phone]  Physical Therapy Evaluation  Patient Details  Name: [name] MRN: [id number] Date of Birth: 1956/09/10 Referring Provider (PT): Aundra Dubin, [place]   Encounter Date: 05/27/2021   PT End of Session - 05/27/21 1449     Visit Number 1    Number of Visits 13    Date for PT Re-Evaluation 07/08/21    Authorization Type MCR: Kx mod at 15th visit , FOTO 6th and 10th visit    Progress Note Due on Visit 10    PT Start Time 1450    PT Stop Time 1531    PT Time Calculation (min) 41 min    Activity Tolerance Patient tolerated treatment well    Behavior During Therapy [facility] for tasks assessed/performed             Past Medical History:  Diagnosis Date   Arthritis    Asthma    Colonic polyp    GERD (gastroesophageal reflux disease)    Hypertension    Obesity    Pain in limb    Smoker    Swelling of limb     Past Surgical History:  Procedure Laterality Date   BIOPSY BREAST Left 09/22/2018   no maglignancy   COLONOSCOPY  2009   Dr. Henrene Pastor   POLYPECTOMY      There were no vitals filed for this visit.    Subjective Assessment - 05/27/21 1456     Subjective pt reports with CC of low back pain that has been going on for a while which she was previously seen at this location for low back pain. She reports hx of bil LE charlie horses in the calf and quad with no specific cause or onest. she reports pain is primarily inthe back described as a twisting / tightening sensation that fluctuates depending on activity. she denies any red flags.    How long can you sit comfortably? 20-30 min can vary on surface    How long can you stand comfortably? 15 min    How long can you walk comfortably? 15 min    Diagnostic tests MRI 2/202/2022 1. L4-5: Moderate bulging of the disc. Facet and ligamentous  hypertrophy more pronounced on the  right than the left. Small  synovial cyst projecting inward from the right. Stenosis of the  lateral recesses and foramina right more than left. Neural  compression could occur at this level, particularly on the right.  2. L5-S1: Chronic disc degeneration with loss of disc height.  Endplate osteophytes and bulging of the disc. Facet and ligamentous  hypertrophy. Bilateral foraminal stenosis right more than left.  Either L5 nerve could be compressed, more likely the right.  3. Facet arthritis at both L4-5 and L5-S1 is associated with mild  edema be a cause of back pain or referred facet syndrome pain.    Patient Stated Goals to decrease low back pain    Currently in Pain? Yes    Pain Score 0-No pain   at [place] 10/10   Pain Location Back    Pain Orientation Right;Left    Pain Descriptors / Indicators Aching;Tightness;Sore    Pain Type Chronic pain    Pain Radiating Towards reports proximal hip sorenss.    Pain Onset More than a month ago    Pain Frequency Intermittent    Aggravating Factors  standing/ walking  Pain Relieving Factors sitting and resting, topical rub    Effect of Pain on Daily Activities limited standing/ walking tolerance                [facility] PT Assessment - 05/27/21 1451       Assessment   Medical Diagnosis Low back pain, unspecified back pain laterality, unspecified chronicity, unspecified whether sciatica present (M54.50)    Referring Provider (PT) Aundra Dubin, PA-C    Onset Date/Surgical Date --   over 10 years   Hand Dominance Right    Next MD Visit --   make on PRN   Prior Therapy yes   for low back 4 years ago     Precautions   Precautions None      Restrictions   Weight Bearing Restrictions No      Balance Screen   Has the patient fallen in the past 6 months No    Has the patient had a decrease in activity level because of a fear of falling?  No    Is the patient reluctant to leave their home because of a fear of falling?  No      Home  Environment   Living Environment Private residence    Living Arrangements Alone    Type of [place] to enter    Entrance Stairs-Number of Steps 15    Entrance Stairs-Rails Can reach both    [place] One level    [place] - single point      Prior Function   Level of [place] with basic ADLs    Vocation On disability;Retired      Charity fundraiser Status Within Functional Limits for tasks assessed      Observation/Other Assessments   Focus on Therapeutic Outcomes (FOTO)  44% function   55% predicted     Posture/Postural Control   Posture/Postural Control Postural limitations    Postural Limitations Rounded Shoulders;Forward head      ROM / Strength   AROM / PROM / Strength AROM;Strength      AROM   AROM Assessment Site Lumbar    Lumbar Flexion 60    Lumbar Extension 8    Lumbar - Right Side Bend 28    Lumbar - Left Side Bend 28      Strength   Strength Assessment Site Hip;Knee    Right/Left Hip Right;Left    Right Hip Flexion 4+/5    Right Hip Extension 4+/5    Right Hip ABduction 4+/5    Right Hip ADduction 4+/5    Left Hip Flexion 4+/5    Left Hip ABduction 4+/5    Left Hip ADduction 4+/5    Right/Left Knee Right;Left    Right Knee Flexion 4+/5    Right Knee Extension 4+/5    Left Knee Flexion 4+/5    Left Knee Extension 4+/5      Palpation   Palpation comment TTP along the bil along bil lumbr paraspinals      Special Tests    Special Tests Lumbar    Lumbar Tests Slump Test      Slump test   Findings Negative                        Objective measurements completed on examination: See above findings.       [facility] Adult PT Treatment/Exercise - 05/27/21 1451  Exercises   Exercises Lumbar      Lumbar Exercises: Stretches   Lower Trunk Rotation Limitations 1 x 10    Prone on Elbows Stretch Limitations 2 x 10   reported decreased soreness in the legs      Lumbar Exercises: Supine   Pelvic Tilt 10 reps;5 seconds   tactile cues for proper form   Bent Knee Raise 10 reps   cues to keep core tight                   PT Education - 05/27/21 1452     Education Details evaluation findings, POC, goals, HEP with proper form/ rationale. FOTO assessment.    Person(s) Educated Patient    Methods Explanation;Verbal cues;Handout    Comprehension Verbalized understanding;Verbal cues required              PT Short Term Goals - 05/27/21 1542       PT SHORT TERM GOAL #1   Title to be I with inital HEP    Baseline -    Time 4    Period Weeks    Status New      PT SHORT TERM GOAL #2   Title to demo / verbalize proper posture and lifting/ carrying techniques to prevent and reduce low back/ hip pain    Baseline -    Time 3    Period Weeks    Status New    Target Date 06/17/21               PT Long Term Goals - 05/27/21 1739       PT LONG TERM GOAL #1   Title pt to reduce low back spasm and pain to </= 2/10 at max to promote QOL    Time 6    Period Weeks    Status New    Target Date 07/08/21      PT LONG TERM GOAL #2   Title pt to be able to stand and walk for >/= 45 min to promote in home and short distance community ambulation with </= 2/10 pain    Time 6    Period Weeks    Status New    Target Date 07/08/21      PT LONG TERM GOAL #3   Title increase FOTO score to >/=55% to demo improvement in function    Time 6    Period Weeks    Status New    Target Date 07/08/21      PT LONG TERM GOAL #4   Title pt to report reduced muscle spasm in her hips/ calfs to </= 1 x week for the last 2 weeks to demo improvement in condition    Time 6    Period Weeks    Status New    Target Date 07/08/21      PT LONG TERM GOAL #5   Title pt to be IND with advanced HEP and is able to maintain and progress current LOF IND    Time 6    Period Weeks    Status New    Target Date 07/08/21                    Plan -  05/27/21 1747     Clinical Impression Statement pt is a pleasant 65 y.o F familiar to this PT presenting to [facility] with CC of chronic low back pain that has been present for years. She demosntrates functional  trunk mobility with report of concordant pain with forward flexion, and limited trunk extension. She has functional LE strength with no report of pain during testing. She has TTP along bil lumbar parapsinals with mulitple trigger points noted, Trialed prone on elbow which she noted improvement of concordant symptoms in the LE. She would benefit from physical therapy to decrease low back pain, improve walking/ standing tolerance and maximize her function by addressing the deficits listed.    Personal Factors and Comorbidities Comorbidity 2;Time since onset of injury/illness/exacerbation;Age    Comorbidities chronic nature of pain, hx of DM    Stability/Clinical Decision Making Evolving/Moderate complexity    Rehab Potential Good    PT Frequency 2x / week    PT Duration 6 weeks    PT Treatment/Interventions ADLs/Self Care Home Management;Aquatic Therapy;Cryotherapy;Electrical Stimulation;Iontophoresis 4mg /ml Dexamethasone;Moist Heat;Ultrasound;Traction;Gait training;Stair training;Therapeutic activities;Therapeutic exercise;Balance training;Neuromuscular re-education;Patient/family education;Manual techniques;Passive range of motion;Dry needling;Taping;Vasopneumatic Device    PT Next Visit Plan review/ updated HEP PRN, continued working on extension bias as tolerated. core / hip strength, posture education    PT Home Exercise Plan [email or web address]    Consulted and Agree with Plan of Care Patient             Patient will benefit from skilled therapeutic intervention in order to improve the following deficits and impairments:  Improper body mechanics, Increased muscle spasms, Decreased strength, Postural dysfunction, Pain, Decreased balance, Decreased endurance, Decreased activity tolerance, Decreased  safety awareness, Decreased range of motion, Decreased mobility, Obesity  Visit Diagnosis: Chronic bilateral low back pain, unspecified whether sciatica present  Abnormal posture  Cramp and spasm     Problem List Patient Active Problem List   Diagnosis Date Noted   Stage 3a chronic kidney disease ([place]) 01/23/2021   Cataract, age-related 04/27/2020   Multiple somatic complaints 01/10/2019   Adenomatous polyp of colon 05/25/2018   History of colonic polyps 03/06/2017   Asthma, chronic, mild persistent, uncomplicated 03/54/6568   Chronic seasonal allergic rhinitis due to pollen 03/06/2017   Gastroesophageal reflux disease without esophagitis 10/10/2014   Ex-cigarette smoker 09/28/2013   Type 2 diabetes mellitus with complications ([place]) 12/75/1700   Obesity (BMI 30-39.9) 07/12/2012   Chronic back pain 07/12/2012   Hypertension associated with diabetes ([place]) 07/12/2012   Dulcy Sida PT, DPT, LAT, ATC  05/27/21  5:48 PM      [facility] [address] [place], [place], [address] Phone: [phone]   Fax:  [phone]  Name: [name] MRN: [id number] Date of Birth: [birth year]

## 2021-06-04 ENCOUNTER — Ambulatory Visit: Payer: Medicare Other | Attending: Physician Assistant

## 2021-06-04 ENCOUNTER — Other Ambulatory Visit: Payer: Self-pay

## 2021-06-04 DIAGNOSIS — M545 Low back pain, unspecified: Secondary | ICD-10-CM | POA: Diagnosis not present

## 2021-06-04 DIAGNOSIS — G8929 Other chronic pain: Secondary | ICD-10-CM | POA: Insufficient documentation

## 2021-06-04 DIAGNOSIS — R252 Cramp and spasm: Secondary | ICD-10-CM

## 2021-06-04 DIAGNOSIS — R293 Abnormal posture: Secondary | ICD-10-CM | POA: Diagnosis not present

## 2021-06-04 NOTE — Therapy (Signed)
[place], [place], [address] Phone: [phone]   Fax:  [phone]  Physical Therapy Treatment  Patient Details  Name: [name] MRN: [id number] Date of Birth: October 18, 1956 Referring Provider (PT): Aundra Dubin, [place]   Encounter Date: 06/04/2021   PT End of Session - 06/04/21 1524     Visit Number 2    Number of Visits 13    Date for PT Re-Evaluation 07/08/21    Authorization Type MCR: Kx mod at 15th visit , FOTO 6th and 10th visit    Progress Note Due on Visit 10    PT Start Time 1525    PT Stop Time 1607    PT Time Calculation (min) 42 min    Activity Tolerance Patient tolerated treatment well    Behavior During Therapy [facility] for tasks assessed/performed             Past Medical History:  Diagnosis Date   Arthritis    Asthma    Colonic polyp    GERD (gastroesophageal reflux disease)    Hypertension    Obesity    Pain in limb    Smoker    Swelling of limb     Past Surgical History:  Procedure Laterality Date   BIOPSY BREAST Left 09/22/2018   no maglignancy   COLONOSCOPY  2009   Dr. Henrene Pastor   POLYPECTOMY      There were no vitals filed for this visit.   Subjective Assessment - 06/04/21 1525     Subjective Patient reports feeling "so/so, still the same mess." She took some ibuprofen and is doing her exercises, but has difficulty doing the pressup on her bed.    How long can you sit comfortably? 20-30 min can vary on surface    How long can you stand comfortably? 15 min    How long can you walk comfortably? 15 min    Diagnostic tests MRI 2/202/2022 1. L4-5: Moderate bulging of the disc. Facet and ligamentous  hypertrophy more pronounced on the right than the left. Small  synovial cyst projecting inward from the right. Stenosis of the  lateral recesses and foramina right more than left. Neural  compression could occur at this level, particularly on the right.  2. L5-S1:  Chronic disc degeneration with loss of disc height.  Endplate osteophytes and bulging of the disc. Facet and ligamentous  hypertrophy. Bilateral foraminal stenosis right more than left.  Either L5 nerve could be compressed, more likely the right.  3. Facet arthritis at both L4-5 and L5-S1 is associated with mild  edema be a cause of back pain or referred facet syndrome pain.    Patient Stated Goals to decrease low back pain    Currently in Pain? Yes    Pain Score 2     Pain Location Back    Pain Orientation Right;Left;Lower    Pain Descriptors / Indicators Other (Comment)   twisting   Pain Type Chronic pain    Pain Onset More than a month ago    Pain Frequency Intermittent                               [facility] Adult PT Treatment/Exercise - 06/04/21 0001       Lumbar Exercises: Stretches   Lower Trunk Rotation 60 seconds    Quad Stretch 60 seconds    Quad Stretch Limitations bilateral with  strap    Figure 4 Stretch 30 seconds    Figure 4 Stretch Limitations x2 bilateral; seated      Lumbar Exercises: Supine   Pelvic Tilt 10 reps    Pelvic Tilt Limitations x2; posterior pelvic tilt      Lumbar Exercises: Sidelying   Clam 10 reps    Clam Limitations x2; bilateral   modified when lying on Lt side (keeps Lt leg extended) due to discomfort     Lumbar Exercises: Prone   Straight Leg Raise 10 reps    Straight Leg Raises Limitations x2; bilateral    Other Prone Lumbar Exercises static prone on elbows 3 minutes    Other Prone Lumbar Exercises prone pressup on elbows 1 x 5                      PT Short Term Goals - 05/27/21 1542       PT SHORT TERM GOAL #1   Title to be I with inital HEP    Baseline -    Time 4    Period Weeks    Status New      PT SHORT TERM GOAL #2   Title to demo / verbalize proper posture and lifting/ carrying techniques to prevent and reduce low back/ hip pain    Baseline -    Time 3    Period Weeks    Status New     Target Date 06/17/21               PT Long Term Goals - 05/27/21 1739       PT LONG TERM GOAL #1   Title pt to reduce low back spasm and pain to </= 2/10 at max to promote QOL    Time 6    Period Weeks    Status New    Target Date 07/08/21      PT LONG TERM GOAL #2   Title pt to be able to stand and walk for >/= 45 min to promote in home and short distance community ambulation with </= 2/10 pain    Time 6    Period Weeks    Status New    Target Date 07/08/21      PT LONG TERM GOAL #3   Title increase FOTO score to >/=55% to demo improvement in function    Time 6    Period Weeks    Status New    Target Date 07/08/21      PT LONG TERM GOAL #4   Title pt to report reduced muscle spasm in her hips/ calfs to </= 1 x week for the last 2 weeks to demo improvement in condition    Time 6    Period Weeks    Status New    Target Date 07/08/21      PT LONG TERM GOAL #5   Title pt to be IND with advanced HEP and is able to maintain and progress current LOF IND    Time 6    Period Weeks    Status New    Target Date 07/08/21                   Plan - 06/04/21 1524     Clinical Impression Statement Patient arrives for first follow-up reporting continued low back pain. Session focused on reviewing initial HEP with patient requiring cues for pacing and to breathe with core stabilization. She reported groin pain  with supine posterior pelvic tilts that was abolished with static prone on elbows. She continues to respond well to extension biased movement as prone pressup reduced her low back pain. She has difficulty performing prone hip extension on the left due to weakness. Overall she tolerated session well today, though quickly fatigues with targeted core and hip strengthening.    Personal Factors and Comorbidities Comorbidity 2;Time since onset of injury/illness/exacerbation;Age    Comorbidities chronic nature of pain, hx of DM    Stability/Clinical Decision Making  Evolving/Moderate complexity    Rehab Potential Good    PT Frequency 2x / week    PT Duration 6 weeks    PT Treatment/Interventions ADLs/Self Care Home Management;Aquatic Therapy;Cryotherapy;Electrical Stimulation;Iontophoresis 4mg /ml Dexamethasone;Moist Heat;Ultrasound;Traction;Gait training;Stair training;Therapeutic activities;Therapeutic exercise;Balance training;Neuromuscular re-education;Patient/family education;Manual techniques;Passive range of motion;Dry needling;Taping;Vasopneumatic Device    PT Next Visit Plan review/ updated HEP PRN, continued working on extension bias as tolerated. core / hip strength, posture education    PT Home Exercise Plan [email or web address]    Consulted and Agree with Plan of Care Patient             Patient will benefit from skilled therapeutic intervention in order to improve the following deficits and impairments:  Improper body mechanics, Increased muscle spasms, Decreased strength, Postural dysfunction, Pain, Decreased balance, Decreased endurance, Decreased activity tolerance, Decreased safety awareness, Decreased range of motion, Decreased mobility, Obesity  Visit Diagnosis: Chronic bilateral low back pain, unspecified whether sciatica present  Abnormal posture  Cramp and spasm     Problem List Patient Active Problem List   Diagnosis Date Noted   Stage 3a chronic kidney disease ([place]) 01/23/2021   Cataract, age-related 04/27/2020   Multiple somatic complaints 01/10/2019   Adenomatous polyp of colon 05/25/2018   History of colonic polyps 03/06/2017   Asthma, chronic, mild persistent, uncomplicated 91/47/8295   Chronic seasonal allergic rhinitis due to pollen 03/06/2017   Gastroesophageal reflux disease without esophagitis 10/10/2014   Ex-cigarette smoker 09/28/2013   Type 2 diabetes mellitus with complications ([place]) 62/13/0865   Obesity (BMI 30-39.9) 07/12/2012   Chronic back pain 07/12/2012   Hypertension associated with diabetes ([place])  07/12/2012   Gwendolyn Grant, PT, DPT, ATC 06/04/21 4:10 PM   [facility] [facility] [address] [place], [place], [address] Phone: [phone]   Fax:  [phone]  Name: [name] MRN: [id number] Date of Birth: May 29, 1956

## 2021-06-06 ENCOUNTER — Ambulatory Visit: Payer: Medicare Other | Admitting: Physical Therapy

## 2021-06-06 ENCOUNTER — Other Ambulatory Visit: Payer: Self-pay

## 2021-06-06 ENCOUNTER — Encounter: Payer: Self-pay | Admitting: Physical Therapy

## 2021-06-06 DIAGNOSIS — R293 Abnormal posture: Secondary | ICD-10-CM | POA: Diagnosis not present

## 2021-06-06 DIAGNOSIS — M545 Low back pain, unspecified: Secondary | ICD-10-CM | POA: Diagnosis not present

## 2021-06-06 DIAGNOSIS — G8929 Other chronic pain: Secondary | ICD-10-CM | POA: Diagnosis not present

## 2021-06-06 DIAGNOSIS — R252 Cramp and spasm: Secondary | ICD-10-CM

## 2021-06-06 NOTE — Therapy (Signed)
[place], [place], [address] Phone: [phone]   Fax:  [phone]  Physical Therapy Treatment  Patient Details  Name: Briony Parveen MRN: [id number] Date of Birth: [birth year] Referring Provider (PT): Aundra Dubin, [place]   Encounter Date: 06/06/2021   PT End of Session - 06/06/21 1448     Visit Number 3    Number of Visits 13    Date for PT Re-Evaluation 07/08/21    Authorization Type MCR: Kx mod at 15th visit , FOTO 6th and 10th visit    Progress Note Due on Visit 10    PT Start Time 1448    PT Stop Time 1529    PT Time Calculation (min) 41 min    Activity Tolerance Patient tolerated treatment well    Behavior During Therapy [facility] for tasks assessed/performed             Past Medical History:  Diagnosis Date   Arthritis    Asthma    Colonic polyp    GERD (gastroesophageal reflux disease)    Hypertension    Obesity    Pain in limb    Smoker    Swelling of limb     Past Surgical History:  Procedure Laterality Date   BIOPSY BREAST Left 09/22/2018   no maglignancy   COLONOSCOPY  2009   Dr. Henrene Pastor   POLYPECTOMY      There were no vitals filed for this visit.   Subjective Assessment - 06/06/21 1450     Subjective Patient rports feeling "so/so, not bothering me too much right now". Patient used theraworks relief and some tylenol arthritis. My spasms come at night but I haven't taken the muscle relaxor yet. Patient reports good compliance with HEP. Also riding a stationary bike she got.    Patient Stated Goals to decrease low back pain    Currently in Pain? Yes    Pain Score 0-No pain   Max=9/10 in the last few days.   Pain Location Back    Pain Orientation Right;Left;Lower    Pain Descriptors / Indicators Spasm;Cramping    Pain Type Chronic pain    Pain Relieving Factors meds, rest, theraworks                [facility] PT Assessment - 06/06/21 0001       Assessment    Medical Diagnosis Low back pain, unspecified back pain laterality, unspecified chronicity, unspecified whether sciatica present (M54.50)    Referring Provider (PT) Aundra Dubin, PA-C    Onset Date/Surgical Date --   over 10 years     Precautions   Precautions None      Restrictions   Weight Bearing Restrictions No                           [facility] Adult PT Treatment/Exercise - 06/06/21 0001       Lumbar Exercises: Aerobic   Nustep L4x34min      Lumbar Exercises: Supine   Pelvic Tilt 10 reps;10 seconds    Pelvic Tilt Limitations posterior    Straight Leg Raise 5 reps    Straight Leg Raises Limitations Poor ROM R    Other Supine Lumbar Exercises Ball squeeze x20      Lumbar Exercises: Sidelying   Clam 10 reps    Clam Limitations x2 R/L YELLOW for second set    Hip Abduction 10 reps;Right;Left  PT Education - 06/06/21 1750     Education Details Continue with current HEP.    Person(s) Educated Patient    Methods Explanation;Verbal cues    Comprehension Verbalized understanding;Returned demonstration              PT Short Term Goals - 05/27/21 1542       PT SHORT TERM GOAL #1   Title to be I with inital HEP    Baseline -    Time 4    Period Weeks    Status New      PT SHORT TERM GOAL #2   Title to demo / verbalize proper posture and lifting/ carrying techniques to prevent and reduce low back/ hip pain    Baseline -    Time 3    Period Weeks    Status New    Target Date 06/17/21               PT Long Term Goals - 05/27/21 1739       PT LONG TERM GOAL #1   Title pt to reduce low back spasm and pain to </= 2/10 at max to promote QOL    Time 6    Period Weeks    Status New    Target Date 07/08/21      PT LONG TERM GOAL #2   Title pt to be able to stand and walk for >/= 45 min to promote in home and short distance community ambulation with </= 2/10 pain    Time 6    Period Weeks    Status New     Target Date 07/08/21      PT LONG TERM GOAL #3   Title increase FOTO score to >/=55% to demo improvement in function    Time 6    Period Weeks    Status New    Target Date 07/08/21      PT LONG TERM GOAL #4   Title pt to report reduced muscle spasm in her hips/ calfs to </= 1 x week for the last 2 weeks to demo improvement in condition    Time 6    Period Weeks    Status New    Target Date 07/08/21      PT LONG TERM GOAL #5   Title pt to be IND with advanced HEP and is able to maintain and progress current LOF IND    Time 6    Period Weeks    Status New    Target Date 07/08/21                   Plan - 06/06/21 1523     Clinical Impression Statement Patient presents reporting no back pain at this minute, but has had episodes of 9/10 pain since prior visit on the day before yesterday.  Patient states she is having a "real problem with muscle cramps and increased pain at night".  Patient demonstrates LE weakness R worse than L with poor endurance for supine and sidelying exercises, requiring rest breaks.  Able to perform NuStep at level 4 for 5 min without break.  Patient would benefit from continued skilled PT to address deficits and maximzie safe funcitonal mobility with decreased pain.    Comorbidities chronic nature of pain, hx of DM    PT Treatment/Interventions ADLs/Self Care Home Management;Aquatic Therapy;Cryotherapy;Electrical Stimulation;Iontophoresis 4mg /ml Dexamethasone;Moist Heat;Ultrasound;Traction;Gait training;Stair training;Therapeutic activities;Therapeutic exercise;Balance training;Neuromuscular re-education;Patient/family education;Manual techniques;Passive range of motion;Dry needling;Taping;Vasopneumatic Device  PT Next Visit Plan review/ updated HEP PRN, continued working on extension bias as tolerated. core / hip strength, posture education    PT Home Exercise Plan [email or web address]    Consulted and Agree with Plan of Care Patient             Patient  will benefit from skilled therapeutic intervention in order to improve the following deficits and impairments:  Improper body mechanics, Increased muscle spasms, Decreased strength, Postural dysfunction, Pain, Decreased balance, Decreased endurance, Decreased activity tolerance, Decreased safety awareness, Decreased range of motion, Decreased mobility, Obesity  Visit Diagnosis: Chronic bilateral low back pain, unspecified whether sciatica present  Abnormal posture  Cramp and spasm     Problem List Patient Active Problem List   Diagnosis Date Noted   Stage 3a chronic kidney disease ([place]) 01/23/2021   Cataract, age-related 04/27/2020   Multiple somatic complaints 01/10/2019   Adenomatous polyp of colon 05/25/2018   History of colonic polyps 03/06/2017   Asthma, chronic, mild persistent, uncomplicated 54/56/2563   Chronic seasonal allergic rhinitis due to pollen 03/06/2017   Gastroesophageal reflux disease without esophagitis 10/10/2014   Ex-cigarette smoker 09/28/2013   Type 2 diabetes mellitus with complications ([place]) 89/37/3428   Obesity (BMI 30-39.9) 07/12/2012   Chronic back pain 07/12/2012   Hypertension associated with diabetes ([place]) 07/12/2012    Pollyann Samples, PT 06/06/2021, 5:54 PM  [facility] [address] [place], [place], [address] Phone: [phone]   Fax:  [phone]  Name: Maizy Davanzo MRN: [id number] Date of Birth: [birth year]

## 2021-06-12 ENCOUNTER — Other Ambulatory Visit: Payer: Self-pay | Admitting: Family Medicine

## 2021-06-12 ENCOUNTER — Other Ambulatory Visit: Payer: Self-pay

## 2021-06-12 ENCOUNTER — Ambulatory Visit: Payer: Medicare Other

## 2021-06-12 DIAGNOSIS — R252 Cramp and spasm: Secondary | ICD-10-CM

## 2021-06-12 DIAGNOSIS — G8929 Other chronic pain: Secondary | ICD-10-CM | POA: Diagnosis not present

## 2021-06-12 DIAGNOSIS — M545 Low back pain, unspecified: Secondary | ICD-10-CM | POA: Diagnosis not present

## 2021-06-12 DIAGNOSIS — R293 Abnormal posture: Secondary | ICD-10-CM

## 2021-06-12 NOTE — Patient Instructions (Signed)

## 2021-06-12 NOTE — Therapy (Signed)
[place], [place], [address] Phone: [phone]   Fax:  [phone]  Physical Therapy Treatment  Patient Details  Name: [name] MRN: [id number] Date of Birth: March 25, 1956 Referring Provider (PT): Aundra Dubin, [place]   Encounter Date: 06/12/2021   PT End of Session - 06/12/21 1605     Visit Number 4    Number of Visits 13    Date for PT Re-Evaluation 07/08/21    Authorization Type MCR: Kx mod at 15th visit , FOTO 6th and 10th visit    Progress Note Due on Visit 10    PT Start Time 1609    PT Stop Time 3646    PT Time Calculation (min) 44 min    Activity Tolerance Patient tolerated treatment well    Behavior During Therapy [facility] for tasks assessed/performed             Past Medical History:  Diagnosis Date   Arthritis    Asthma    Colonic polyp    GERD (gastroesophageal reflux disease)    Hypertension    Obesity    Pain in limb    Smoker    Swelling of limb     Past Surgical History:  Procedure Laterality Date   BIOPSY BREAST Left 09/22/2018   no maglignancy   COLONOSCOPY  2009   Dr. Henrene Pastor   POLYPECTOMY      There were no vitals filed for this visit.   Subjective Assessment - 06/12/21 1612     Subjective Patient reports feeling "so/so" and that her buttcheeks hurt sometimes, but not right now. No pain currently, but her knees were killing her yesterday.    Patient Stated Goals to decrease low back pain    Currently in Pain? No/denies                               [facility] Adult PT Treatment/Exercise - 06/12/21 0001       Self-Care   Self-Care Other Self-Care Comments    Other Self-Care Comments  see patient education      Lumbar Exercises: Stretches   Figure 4 Stretch 30 seconds    Figure 4 Stretch Limitations bilateral; seated    Gastroc Stretch 30 seconds    Gastroc Stretch Limitations bilateral with strap      Lumbar Exercises:  Aerobic   Nustep L4 x 5 min UE/UE      Lumbar Exercises: Standing   Other Standing Lumbar Exercises hip hinge with dowel 2 x 10      Lumbar Exercises: Seated   Long Arc Quad on Chair 10 reps    LAQ on Chair Weights (lbs) 2    LAQ on Chair Limitations x2; bilateral    Other Seated Lumbar Exercises hip hinge with dowel 2 x 10      Lumbar Exercises: Supine   Pelvic Tilt 5 reps    Pelvic Tilt Limitations posterior    Bent Knee Raise 10 reps   x2   Bridge Limitations attempted unable                    PT Education - 06/12/21 1656     Education Details Updated HEP. Posture/body mechanics education.    Person(s) Educated Patient    Methods Explanation;Demonstration;Handout;Verbal cues    Comprehension Verbalized understanding;Returned demonstration;Verbal cues required  PT Short Term Goals - 06/12/21 1624       PT SHORT TERM GOAL #1   Title to be I with inital HEP    Time 4    Period Weeks    Status Achieved      PT SHORT TERM GOAL #2   Title to demo / verbalize proper posture and lifting/ carrying techniques to prevent and reduce low back/ hip pain    Baseline began posture/lifting education    Time 3    Period Weeks    Status On-going    Target Date 06/17/21               PT Long Term Goals - 05/27/21 1739       PT LONG TERM GOAL #1   Title pt to reduce low back spasm and pain to </= 2/10 at max to promote QOL    Time 6    Period Weeks    Status New    Target Date 07/08/21      PT LONG TERM GOAL #2   Title pt to be able to stand and walk for >/= 45 min to promote in home and short distance community ambulation with </= 2/10 pain    Time 6    Period Weeks    Status New    Target Date 07/08/21      PT LONG TERM GOAL #3   Title increase FOTO score to >/=55% to demo improvement in function    Time 6    Period Weeks    Status New    Target Date 07/08/21      PT LONG TERM GOAL #4   Title pt to report reduced muscle spasm in  her hips/ calfs to </= 1 x week for the last 2 weeks to demo improvement in condition    Time 6    Period Weeks    Status New    Target Date 07/08/21      PT LONG TERM GOAL #5   Title pt to be IND with advanced HEP and is able to maintain and progress current LOF IND    Time 6    Period Weeks    Status New    Target Date 07/08/21                   Plan - 06/12/21 1634     Clinical Impression Statement Patient arrives without reports of pain, though reports intermittent knee and hip pain since last session. Began educating patient on appropriate posture and bending/lifting mechanics with handout provided. Initial cues required for proper hip hinge, but with continued practice she demonstrates excellent form. Attempted bridging though patient unable to complete due to weakness and inability to maintain core activation. One episode of Lt hamstring cramping with supine marching, otherwise no issues reported throughout session.    Comorbidities chronic nature of pain, hx of DM    PT Treatment/Interventions ADLs/Self Care Home Management;Aquatic Therapy;Cryotherapy;Electrical Stimulation;Iontophoresis 4mg /ml Dexamethasone;Moist Heat;Ultrasound;Traction;Gait training;Stair training;Therapeutic activities;Therapeutic exercise;Balance training;Neuromuscular re-education;Patient/family education;Manual techniques;Passive range of motion;Dry needling;Taping;Vasopneumatic Device    PT Next Visit Plan review/ updated HEP PRN, continued working on extension bias as tolerated. core / hip strength, posture education    PT Home Exercise Plan [email or web address]    Consulted and Agree with Plan of Care Patient             Patient will benefit from skilled therapeutic intervention in order to improve the following deficits and impairments:  Improper body mechanics, Increased muscle spasms, Decreased strength, Postural dysfunction, Pain, Decreased balance, Decreased endurance, Decreased activity tolerance,  Decreased safety awareness, Decreased range of motion, Decreased mobility, Obesity  Visit Diagnosis: Chronic bilateral low back pain, unspecified whether sciatica present  Abnormal posture  Cramp and spasm     Problem List Patient Active Problem List   Diagnosis Date Noted   Stage 3a chronic kidney disease ([place]) 01/23/2021   Cataract, age-related 04/27/2020   Multiple somatic complaints 01/10/2019   Adenomatous polyp of colon 05/25/2018   History of colonic polyps 03/06/2017   Asthma, chronic, mild persistent, uncomplicated 12/04/457   Chronic seasonal allergic rhinitis due to pollen 03/06/2017   Gastroesophageal reflux disease without esophagitis 10/10/2014   Ex-cigarette smoker 09/28/2013   Type 2 diabetes mellitus with complications ([place]) 13/68/5992   Obesity (BMI 30-39.9) 07/12/2012   Chronic back pain 07/12/2012   Hypertension associated with diabetes ([place]) 07/12/2012   Gwendolyn Grant, PT, DPT, ATC 06/12/21 4:58 PM   [facility] [address] [place], [place], [address] Phone: [phone]   Fax:  [phone]  Name: [name] MRN: [id number] Date of Birth: 09/25/1956

## 2021-06-14 ENCOUNTER — Ambulatory Visit: Payer: Medicare Other | Admitting: Physical Therapy

## 2021-06-17 ENCOUNTER — Ambulatory Visit: Payer: Medicare Other | Admitting: Physical Therapy

## 2021-06-17 ENCOUNTER — Other Ambulatory Visit: Payer: Self-pay

## 2021-06-17 ENCOUNTER — Encounter: Payer: Self-pay | Admitting: Physical Therapy

## 2021-06-17 DIAGNOSIS — M545 Low back pain, unspecified: Secondary | ICD-10-CM

## 2021-06-17 DIAGNOSIS — R252 Cramp and spasm: Secondary | ICD-10-CM | POA: Diagnosis not present

## 2021-06-17 DIAGNOSIS — R293 Abnormal posture: Secondary | ICD-10-CM | POA: Diagnosis not present

## 2021-06-17 DIAGNOSIS — G8929 Other chronic pain: Secondary | ICD-10-CM

## 2021-06-17 NOTE — Therapy (Signed)
[place], [place], [address] Phone: [phone]   Fax:  [phone]  Physical Therapy Treatment  Patient Details  Name: [name] MRN: [id number] Date of Birth: September 10, 1956 Referring Provider (PT): Aundra Dubin, [place]   Encounter Date: 06/17/2021   PT End of Session - 06/17/21 1501     Visit Number 5    Number of Visits 13    Date for PT Re-Evaluation 07/08/21    Authorization Type MCR: Kx mod at 15th visit , FOTO 6th and 10th visit    Progress Note Due on Visit 10    PT Start Time 1446    PT Stop Time 1532    PT Time Calculation (min) 46 min    Activity Tolerance Patient tolerated treatment well    Behavior During Therapy [facility] for tasks assessed/performed             Past Medical History:  Diagnosis Date   Arthritis    Asthma    Colonic polyp    GERD (gastroesophageal reflux disease)    Hypertension    Obesity    Pain in limb    Smoker    Swelling of limb     Past Surgical History:  Procedure Laterality Date   BIOPSY BREAST Left 09/22/2018   no maglignancy   COLONOSCOPY  2009   Dr. Henrene Pastor   POLYPECTOMY      There were no vitals filed for this visit.   Subjective Assessment - 06/17/21 1456     Subjective Patient reports good compliance with HEP.  "I have been bending forward a lot too." Patient states her legs are tired.    Patient Stated Goals to decrease low back pain    Pain Score 0-No pain   Max= 8/10   Pain Location Back                               [facility] Adult PT Treatment/Exercise - 06/17/21 0001       Lumbar Exercises: Stretches   Lower Trunk Rotation 5 reps;10 seconds    Prone on Elbows Stretch 60 seconds    Press Ups 10 reps    Press Ups Limitations 1set elbows stay down, 1 set elbows up.    Other Lumbar Stretch Exercise Childs pose 10sec x3      Lumbar Exercises: Aerobic   Nustep L5 x 10 min UE/UE   10 min per pat request      Lumbar Exercises: Seated   Other Seated Lumbar Exercises Ball roll out FWDx10   V cues for posture to maintain hip hinge positioning.     Lumbar Exercises: Supine   Dead Bug 3 seconds;5 reps    Dead Bug Limitations Modified with LEs only    Bridge 10 reps;3 seconds    Other Supine Lumbar Exercises Lower                    PT Education - 06/17/21 1459     Education Details Recommended vary between sitting and moving throughout the day.    Person(s) Educated Patient    Methods Explanation;Demonstration    Comprehension Verbalized understanding;Returned demonstration              PT Short Term Goals - 06/12/21 1624       PT SHORT TERM GOAL #1   Title to be I with inital HEP  Time 4    Period Weeks    Status Achieved      PT SHORT TERM GOAL #2   Title to demo / verbalize proper posture and lifting/ carrying techniques to prevent and reduce low back/ hip pain    Baseline began posture/lifting education    Time 3    Period Weeks    Status On-going    Target Date 06/17/21               PT Long Term Goals - 05/27/21 1739       PT LONG TERM GOAL #1   Title pt to reduce low back spasm and pain to </= 2/10 at max to promote QOL    Time 6    Period Weeks    Status New    Target Date 07/08/21      PT LONG TERM GOAL #2   Title pt to be able to stand and walk for >/= 45 min to promote in home and short distance community ambulation with </= 2/10 pain    Time 6    Period Weeks    Status New    Target Date 07/08/21      PT LONG TERM GOAL #3   Title increase FOTO score to >/=55% to demo improvement in function    Time 6    Period Weeks    Status New    Target Date 07/08/21      PT LONG TERM GOAL #4   Title pt to report reduced muscle spasm in her hips/ calfs to </= 1 x week for the last 2 weeks to demo improvement in condition    Time 6    Period Weeks    Status New    Target Date 07/08/21      PT LONG TERM GOAL #5   Title pt to be IND  with advanced HEP and is able to maintain and progress current LOF IND    Time 6    Period Weeks    Status New    Target Date 07/08/21                   Plan - 06/17/21 1502     Clinical Impression Statement Patient arrives without reports of pain. Episode of dizziness upon standing in lobby, patient states happens when she gets up too fast.  Orthostatic BPs taken without indication of positional hypotension BP(supine)= 138/60; BP(sitx28min)=132/57; BP(standx59min)= 141/63. Reports good compliance with HEP. Patient reports good use of the hip-hinge taught last visit with good results. Patient with improved ability to use NuStep and talk at the same time today indicating improved activity tolerance, mild improvement with bridging a few inches and controlled decent with cues for exhale. Patient tolerated treatment well, did indicated fatigue with B UEs and rubbed Theraworx on triceps before leaving clinic.  Patient will benefit from continued skilled PT to address deficits and improve core stability.    PT Treatment/Interventions ADLs/Self Care Home Management;Aquatic Therapy;Cryotherapy;Electrical Stimulation;Iontophoresis 4mg /ml Dexamethasone;Moist Heat;Ultrasound;Traction;Gait training;Stair training;Therapeutic activities;Therapeutic exercise;Balance training;Neuromuscular re-education;Patient/family education;Manual techniques;Passive range of motion;Dry needling;Taping;Vasopneumatic Device    PT Next Visit Plan review/ updated HEP PRN, continued working on extension bias as tolerated. core / hip strength, posture education    PT Home Exercise Plan [email or web address]    Consulted and Agree with Plan of Care Patient             Patient will benefit from skilled therapeutic intervention in order to improve the following  deficits and impairments:  Improper body mechanics, Increased muscle spasms, Decreased strength, Postural dysfunction, Pain, Decreased balance, Decreased endurance, Decreased  activity tolerance, Decreased safety awareness, Decreased range of motion, Decreased mobility, Obesity  Visit Diagnosis: Chronic bilateral low back pain, unspecified whether sciatica present  Abnormal posture  Cramp and spasm     Problem List Patient Active Problem List   Diagnosis Date Noted   Stage 3a chronic kidney disease ([place]) 01/23/2021   Cataract, age-related 04/27/2020   Multiple somatic complaints 01/10/2019   Adenomatous polyp of colon 05/25/2018   History of colonic polyps 03/06/2017   Asthma, chronic, mild persistent, uncomplicated 59/56/3875   Chronic seasonal allergic rhinitis due to pollen 03/06/2017   Gastroesophageal reflux disease without esophagitis 10/10/2014   Ex-cigarette smoker 09/28/2013   Type 2 diabetes mellitus with complications ([place]) 64/33/2951   Obesity (BMI 30-39.9) 07/12/2012   Chronic back pain 07/12/2012   Hypertension associated with diabetes ([place]) 07/12/2012    Pollyann Samples. PT 06/17/2021, 3:49 PM  [facility] [address] [place], [place], [address] Phone: [phone]   Fax:  [phone]  Name: [name] MRN: [id number] Date of Birth: [birth year]

## 2021-06-20 ENCOUNTER — Encounter: Payer: Self-pay | Admitting: Physical Therapy

## 2021-06-20 ENCOUNTER — Other Ambulatory Visit: Payer: Self-pay

## 2021-06-20 ENCOUNTER — Ambulatory Visit: Payer: Medicare Other | Admitting: Physical Therapy

## 2021-06-20 VITALS — HR 85

## 2021-06-20 DIAGNOSIS — G8929 Other chronic pain: Secondary | ICD-10-CM

## 2021-06-20 DIAGNOSIS — R252 Cramp and spasm: Secondary | ICD-10-CM | POA: Diagnosis not present

## 2021-06-20 DIAGNOSIS — R293 Abnormal posture: Secondary | ICD-10-CM

## 2021-06-20 DIAGNOSIS — M545 Low back pain, unspecified: Secondary | ICD-10-CM

## 2021-06-20 NOTE — Patient Instructions (Signed)
Access Code: [email or web address] URL: [email or web address] Date: 06/20/2021 Prepared by: Pollyann Samples  NEW exercises Hooklying Single Knee to Chest Stretch - 1 x daily - 7 x weekly - 3 sets - 10 reps - 10sec hold Seated Table Hamstring Stretch - 1 x daily - 7 x weekly - 2 sets - 30sec hold

## 2021-06-20 NOTE — Therapy (Signed)
[place], [place], [address] Phone: [phone]   Fax:  [phone]  Physical Therapy Treatment  Patient Details  Name: [name] MRN: [id number] Date of Birth: 1956/06/15 Referring Provider (PT): Aundra Dubin, [place]   Encounter Date: 06/20/2021   PT End of Session - 06/20/21 1438     Visit Number 6    Number of Visits 13    Date for PT Re-Evaluation 07/08/21    Authorization Type MCR: Kx mod at 15th visit , FOTO 6th and 10th visit    Progress Note Due on Visit 10    PT Start Time 1440    PT Stop Time 1533    PT Time Calculation (min) 53 min    Activity Tolerance Patient tolerated treatment well    Behavior During Therapy [facility] for tasks assessed/performed             Past Medical History:  Diagnosis Date   Arthritis    Asthma    Colonic polyp    GERD (gastroesophageal reflux disease)    Hypertension    Obesity    Pain in limb    Smoker    Swelling of limb     Past Surgical History:  Procedure Laterality Date   BIOPSY BREAST Left 09/22/2018   no maglignancy   COLONOSCOPY  2009   Dr. Henrene Pastor   POLYPECTOMY      Vitals:   06/20/21 1443  Pulse: 85  SpO2: 96%     Subjective Assessment - 06/20/21 1443     Subjective Patient reports good compliance with HEP. "I haven't had a charlie horse in a while." Patient states her legs are still tired. "One day at a time."    Pain Score 2     Pain Location Back    Pain Orientation Right;Left;Lower    Pain Descriptors / Indicators Aching    Pain Type Chronic pain                [facility] PT Assessment - 06/20/21 0001       Assessment   Medical Diagnosis Low back pain, unspecified back pain laterality, unspecified chronicity, unspecified whether sciatica present (M54.50)    Referring Provider (PT) Aundra Dubin, PA-C      Precautions   Precautions None      Restrictions   Weight Bearing Restrictions No                            [facility] Adult PT Treatment/Exercise - 06/20/21 0001       Lumbar Exercises: Stretches   Passive Hamstring Stretch 30 seconds;2 reps    Passive Hamstring Stretch Limitations 1/2 long sit    Standing Extension 15 reps    Other Lumbar Stretch Exercise SKTC 3x10sec      Lumbar Exercises: Aerobic   Nustep L5 x 5 min UE/UE      Lumbar Exercises: Standing   Functional Squats 10 reps    Functional Squats Limitations cues for hip hinge    Scapular Retraction 10 reps    Theraband Level (Scapular Retraction) Level 2 (Red)    Other Standing Lumbar Exercises hip hinge with dowel 2 x 10      Lumbar Exercises: Supine   Dead Bug 3 seconds;5 reps    Dead Bug Limitations Modified with LEs only    Straight Leg Raise 15 reps    Straight Leg Raises  Limitations Poor ROM R    Other Supine Lumbar Exercises Lower trunk rotation x10 ea dir      Knee/Hip Exercises: Standing   Heel Raises 15 reps    Hip Abduction 15 reps;Right;Left    Lateral Step Up 10 reps;1 set;Right;Left                      PT Short Term Goals - 06/12/21 1624       PT SHORT TERM GOAL #1   Title to be I with inital HEP    Time 4    Period Weeks    Status Achieved      PT SHORT TERM GOAL #2   Title to demo / verbalize proper posture and lifting/ carrying techniques to prevent and reduce low back/ hip pain    Baseline began posture/lifting education    Time 3    Period Weeks    Status On-going    Target Date 06/17/21               PT Long Term Goals - 05/27/21 1739       PT LONG TERM GOAL #1   Title pt to reduce low back spasm and pain to </= 2/10 at max to promote QOL    Time 6    Period Weeks    Status New    Target Date 07/08/21      PT LONG TERM GOAL #2   Title pt to be able to stand and walk for >/= 45 min to promote in home and short distance community ambulation with </= 2/10 pain    Time 6    Period Weeks    Status New    Target Date 07/08/21      PT  LONG TERM GOAL #3   Title increase FOTO score to >/=55% to demo improvement in function    Time 6    Period Weeks    Status New    Target Date 07/08/21      PT LONG TERM GOAL #4   Title pt to report reduced muscle spasm in her hips/ calfs to </= 1 x week for the last 2 weeks to demo improvement in condition    Time 6    Period Weeks    Status New    Target Date 07/08/21      PT LONG TERM GOAL #5   Title pt to be IND with advanced HEP and is able to maintain and progress current LOF IND    Time 6    Period Weeks    Status New    Target Date 07/08/21                   Plan - 06/20/21 1638     Clinical Impression Statement Patient reports improved ability to don pants with less difficulty and SOB although continues with decreased endurance and activity tolerance, requires rest for resp recovery and fatigue.   Patient likes to start with NuStep warm up.  Patient generally tight at trunk and hips, continues to have pain relief with extension, standing extension added to HEP. Patient will benefit from continued skilled PT to address deficits and improve core stability.    Comorbidities chronic nature of pain, hx of DM    PT Treatment/Interventions ADLs/Self Care Home Management;Aquatic Therapy;Cryotherapy;Electrical Stimulation;Iontophoresis 4mg /ml Dexamethasone;Moist Heat;Ultrasound;Traction;Gait training;Stair training;Therapeutic activities;Therapeutic exercise;Balance training;Neuromuscular re-education;Patient/family education;Manual techniques;Passive range of motion;Dry needling;Taping;Vasopneumatic Device    PT Next Visit Plan  Review/ updated HEP PRN, continued working on extension bias as tolerated. core /hip strength/range, posture education    PT Home Exercise Plan [email or web address]    Consulted and Agree with Plan of Care Patient             Patient will benefit from skilled therapeutic intervention in order to improve the following deficits and impairments:  Improper body  mechanics, Increased muscle spasms, Decreased strength, Postural dysfunction, Pain, Decreased balance, Decreased endurance, Decreased activity tolerance, Decreased safety awareness, Decreased range of motion, Decreased mobility, Obesity  Visit Diagnosis: Chronic bilateral low back pain, unspecified whether sciatica present  Abnormal posture  Cramp and spasm     Problem List Patient Active Problem List   Diagnosis Date Noted   Stage 3a chronic kidney disease ([place]) 01/23/2021   Cataract, age-related 04/27/2020   Multiple somatic complaints 01/10/2019   Adenomatous polyp of colon 05/25/2018   History of colonic polyps 03/06/2017   Asthma, chronic, mild persistent, uncomplicated 56/25/6389   Chronic seasonal allergic rhinitis due to pollen 03/06/2017   Gastroesophageal reflux disease without esophagitis 10/10/2014   Ex-cigarette smoker 09/28/2013   Type 2 diabetes mellitus with complications ([place]) 37/34/2876   Obesity (BMI 30-39.9) 07/12/2012   Chronic back pain 07/12/2012   Hypertension associated with diabetes ([place]) 07/12/2012    Pollyann Samples, PT 06/20/2021, 4:45 PM  [place] [facility] [address] [place], [place], [address] Phone: [phone]   Fax:  [phone]  Name: [name] MRN: [id number] Date of Birth: 04/02/1956

## 2021-06-24 ENCOUNTER — Ambulatory Visit: Payer: Medicare Other | Admitting: Physical Therapy

## 2021-06-25 ENCOUNTER — Ambulatory Visit: Payer: Medicare Other | Admitting: Physical Therapy

## 2021-06-27 ENCOUNTER — Encounter: Payer: Self-pay | Admitting: Physical Therapy

## 2021-06-27 ENCOUNTER — Ambulatory Visit: Payer: Medicare Other | Admitting: Physical Therapy

## 2021-06-27 ENCOUNTER — Other Ambulatory Visit: Payer: Self-pay

## 2021-06-27 DIAGNOSIS — R252 Cramp and spasm: Secondary | ICD-10-CM | POA: Diagnosis not present

## 2021-06-27 DIAGNOSIS — M545 Low back pain, unspecified: Secondary | ICD-10-CM | POA: Diagnosis not present

## 2021-06-27 DIAGNOSIS — R293 Abnormal posture: Secondary | ICD-10-CM

## 2021-06-27 DIAGNOSIS — G8929 Other chronic pain: Secondary | ICD-10-CM

## 2021-06-27 NOTE — Therapy (Signed)
[place], [place], [address] Phone: [phone]   Fax:  [phone]  Physical Therapy Treatment  Patient Details  Name: [name] MRN: [id number] Date of Birth: 1956-11-16 Referring Provider (PT): Aundra Dubin, [place]   Encounter Date: 06/27/2021   PT End of Session - 06/27/21 1445     Visit Number 7    Number of Visits 13    Date for PT Re-Evaluation 07/08/21    Authorization Type MCR: Kx mod at 15th visit , FOTO 6th and 10th visit    Progress Note Due on Visit 10    PT Start Time [id number]    PT Stop Time 1529    PT Time Calculation (min) 44 min    Activity Tolerance Patient tolerated treatment well    Behavior During Therapy [facility] for tasks assessed/performed             Past Medical History:  Diagnosis Date   Arthritis    Asthma    Colonic polyp    GERD (gastroesophageal reflux disease)    Hypertension    Obesity    Pain in limb    Smoker    Swelling of limb     Past Surgical History:  Procedure Laterality Date   BIOPSY BREAST Left 09/22/2018   no maglignancy   COLONOSCOPY  2009   Dr. Henrene Pastor   POLYPECTOMY      There were no vitals filed for this visit.   Subjective Assessment - 06/27/21 1446     Subjective Patient reports doing the exercises before getting out of bed. "I'm not getting as many Charlie Horses." Patient reports doing her bike everyday.                [facility] PT Assessment - 06/27/21 0001       Assessment   Medical Diagnosis Low back pain, unspecified back pain laterality, unspecified chronicity, unspecified whether sciatica present (M54.50)    Referring Provider (PT) Aundra Dubin, PA-C      Precautions   Precautions None      Restrictions   Weight Bearing Restrictions No                           [facility] Adult PT Treatment/Exercise - 06/27/21 0001       Lumbar Exercises: Stretches   Wellsite geologist relax in prone      Lumbar Exercises: Aerobic   Nustep L5 x 6.5 min UE/UE      Lumbar Exercises: Standing   Other Standing Lumbar Exercises Pallof Press GREEN      Lumbar Exercises: Sidelying   Clam 10 reps    Clam Limitations x2 R/L RED for second set    Hip Abduction 10 reps;Right;Left    Other Sidelying Lumbar Exercises L side open book 2x10      Knee/Hip Exercises: Standing   Heel Raises 15 reps    Lateral Step Up 10 reps;1 set;Right;Left;Step Height: 4"                    PT Education - 06/27/21 1530     Education Details ITB massage with soup can    Person(s) Educated Patient    Methods Explanation;Demonstration;Handout    Comprehension Returned demonstration              PT Short Term Goals -  06/12/21 1624       PT SHORT TERM GOAL #1   Title to be I with inital HEP    Time 4    Period Weeks    Status Achieved      PT SHORT TERM GOAL #2   Title to demo / verbalize proper posture and lifting/ carrying techniques to prevent and reduce low back/ hip pain    Baseline began posture/lifting education    Time 3    Period Weeks    Status On-going    Target Date 06/17/21               PT Long Term Goals - 05/27/21 1739       PT LONG TERM GOAL #1   Title pt to reduce low back spasm and pain to </= 2/10 at max to promote QOL    Time 6    Period Weeks    Status New    Target Date 07/08/21      PT LONG TERM GOAL #2   Title pt to be able to stand and walk for >/= 45 min to promote in home and short distance community ambulation with </= 2/10 pain    Time 6    Period Weeks    Status New    Target Date 07/08/21      PT LONG TERM GOAL #3   Title increase FOTO score to >/=55% to demo improvement in function    Time 6    Period Weeks    Status New    Target Date 07/08/21      PT LONG TERM GOAL #4   Title pt to report reduced muscle spasm in her hips/ calfs to </= 1 x week for the last 2 weeks to demo  improvement in condition    Time 6    Period Weeks    Status New    Target Date 07/08/21      PT LONG TERM GOAL #5   Title pt to be IND with advanced HEP and is able to maintain and progress current LOF IND    Time 6    Period Weeks    Status New    Target Date 07/08/21                   Plan - 06/27/21 1532     Clinical Impression Statement Patient reports improvement with getting up and down off the floor.  Patient had hamstring spasm during prone hamstring curls, SLR contract relax performed successfully.  Patient also instructed in soup can ITB massage for palpable tender points along ITB. Improved activity tolerance, but does have issues with asthma, requires rest for resp recovery and fatigue. Patient likes to start with NuStep warm up. Patient generally tight at trunk and hips, continues to have pain relief with extension, standing extension added to HEP. Patient will benefit from continued skilled PT to address deficits and improve core stability.    Comorbidities chronic nature of pain, hx of DM    PT Treatment/Interventions ADLs/Self Care Home Management;Aquatic Therapy;Cryotherapy;Electrical Stimulation;Iontophoresis '4mg'$ /ml Dexamethasone;Moist Heat;Ultrasound;Traction;Gait training;Stair training;Therapeutic activities;Therapeutic exercise;Balance training;Neuromuscular re-education;Patient/family education;Manual techniques;Passive range of motion;Dry needling;Taping;Vasopneumatic Device    PT Next Visit Plan Review/ updated HEP PRN, continued working on extension bias as tolerated. core /hip strength/range, posture education    PT Home Exercise Plan [email or web address]    Consulted and Agree with Plan of Care Patient  Patient will benefit from skilled therapeutic intervention in order to improve the following deficits and impairments:  Improper body mechanics, Increased muscle spasms, Decreased strength, Postural dysfunction, Pain, Decreased balance, Decreased  endurance, Decreased activity tolerance, Decreased safety awareness, Decreased range of motion, Decreased mobility, Obesity  Visit Diagnosis: Chronic bilateral low back pain, unspecified whether sciatica present  Abnormal posture  Cramp and spasm     Problem List Patient Active Problem List   Diagnosis Date Noted   Stage 3a chronic kidney disease ([place]) 01/23/2021   Cataract, age-related 04/27/2020   Multiple somatic complaints 01/10/2019   Adenomatous polyp of colon 05/25/2018   History of colonic polyps 03/06/2017   Asthma, chronic, mild persistent, uncomplicated [id number]   Chronic seasonal allergic rhinitis due to pollen 03/06/2017   Gastroesophageal reflux disease without esophagitis 10/10/2014   Ex-cigarette smoker 09/28/2013   Type 2 diabetes mellitus with complications ([place]) [id number]   Obesity (BMI 30-39.9) 07/12/2012   Chronic back pain 07/12/2012   Hypertension associated with diabetes ([place]) 07/12/2012    Pollyann Samples, PT 06/27/2021, 5:42 PM  [facility] [facility] [address] [place], [place], [address] Phone: [phone]   Fax:  [phone]  Name: Corvetta Riedinger MRN: [id number] Date of Birth: 1956/04/28

## 2021-06-28 ENCOUNTER — Telehealth: Payer: Self-pay

## 2021-06-28 NOTE — Telephone Encounter (Signed)
[employer] needs question number 5 explained. Form needs to be sent back by the 10 th of august. Please advise if you would fill that question out. [place]

## 2021-07-02 ENCOUNTER — Other Ambulatory Visit: Payer: Self-pay | Admitting: Family Medicine

## 2021-07-02 DIAGNOSIS — J4530 Mild persistent asthma, uncomplicated: Secondary | ICD-10-CM

## 2021-07-02 NOTE — Telephone Encounter (Signed)
[facility] is requesting to fil pt pro air. Please advise Kh

## 2021-07-03 ENCOUNTER — Telehealth: Payer: Self-pay | Admitting: Family Medicine

## 2021-07-03 ENCOUNTER — Encounter: Payer: Medicare Other | Admitting: Physical Therapy

## 2021-07-03 DIAGNOSIS — E119 Type 2 diabetes mellitus without complications: Secondary | ICD-10-CM | POA: Diagnosis not present

## 2021-07-03 NOTE — Telephone Encounter (Signed)
Fax from [phone]  Last filled 04/11/21

## 2021-07-05 ENCOUNTER — Other Ambulatory Visit: Payer: Self-pay

## 2021-07-05 ENCOUNTER — Ambulatory Visit: Payer: Medicare Other | Attending: Physician Assistant | Admitting: Physical Therapy

## 2021-07-05 DIAGNOSIS — G8929 Other chronic pain: Secondary | ICD-10-CM | POA: Diagnosis not present

## 2021-07-05 DIAGNOSIS — M545 Low back pain, unspecified: Secondary | ICD-10-CM | POA: Insufficient documentation

## 2021-07-05 DIAGNOSIS — R293 Abnormal posture: Secondary | ICD-10-CM | POA: Insufficient documentation

## 2021-07-05 DIAGNOSIS — R252 Cramp and spasm: Secondary | ICD-10-CM | POA: Diagnosis not present

## 2021-07-05 NOTE — Patient Instructions (Signed)
Access Code: [email or web address] URL: [email or web address] Date: 07/05/2021 Prepared by: Starr [place]  Exercises Lower Trunk Rotation - 1 x daily - 7 x weekly - 2 sets - 10 reps Static Prone on Elbows - 1 x daily - 7 x weekly - 3 sets - 15 reps - 3 seconds hold Supine Posterior Pelvic Tilt - 1 x daily - 7 x weekly - 2 sets - 10 reps - 5 seconds hold Supine March - 1 x daily - 7 x weekly - 10 reps - 2 sets - 5 hold Standing Hip Hinge with Dowel - 1 x daily - 7 x weekly - 2 sets - 10 reps Standing Lumbar Extension - 5 x daily - 7 x weekly - 3 sets - 10 reps Hooklying Single Knee to Chest Stretch - 1 x daily - 7 x weekly - 3 sets - 10 reps - 10sec hold Seated Table Hamstring Stretch - 1 x daily - 7 x weekly - 2 sets - 30sec hold Roller Massage Elongated IT Band Release - 1 x daily - 7 x weekly - 2 sets - 30-60sec hold Prone Press Up - 1 x daily - 7 x weekly - 2 sets - 10 reps Clamshell - 1 x daily - 7 x weekly - 2 sets - 10 reps

## 2021-07-05 NOTE — Therapy (Signed)
[place], [place], [address] Phone: [phone]   Fax:  [phone]  Physical Therapy Treatment /  Discharge  Patient Details  Name: [name] MRN: [id number] Date of Birth: [birth year] Referring Provider (PT): Aundra Dubin, [place]   Encounter Date: 07/05/2021   PT End of Session - 07/05/21 1343     Visit Number 8    Number of Visits 13    Date for PT Re-Evaluation 07/08/21    Authorization Type MCR: Kx mod at 15th visit , FOTO 6th and 10th visit    Progress Note Due on Visit 10    PT Start Time 1343    PT Stop Time 1410    PT Time Calculation (min) 27 min    Activity Tolerance Patient tolerated treatment well    Behavior During Therapy [facility] for tasks assessed/performed             Past Medical History:  Diagnosis Date   Arthritis    Asthma    Colonic polyp    GERD (gastroesophageal reflux disease)    Hypertension    Obesity    Pain in limb    Smoker    Swelling of limb     Past Surgical History:  Procedure Laterality Date   BIOPSY BREAST Left 09/22/2018   no maglignancy   COLONOSCOPY  2009   Dr. Henrene Pastor   POLYPECTOMY      There were no vitals filed for this visit.   Subjective Assessment - 07/05/21 1349     Subjective " my back has really been doing pretty good, my biggest limitation is my asthma which is bothering me the most. The back does give me some issue but not very frequently.    Diagnostic tests MRI 2/202/2022 1. L4-5: Moderate bulging of the disc. Facet and ligamentous  hypertrophy more pronounced on the right than the left. Small  synovial cyst projecting inward from the right. Stenosis of the  lateral recesses and foramina right more than left. Neural  compression could occur at this level, particularly on the right.  2. L5-S1: Chronic disc degeneration with loss of disc height.  Endplate osteophytes and bulging of the disc. Facet and ligamentous  hypertrophy.  Bilateral foraminal stenosis right more than left.  Either L5 nerve could be compressed, more likely the right.  3. Facet arthritis at both L4-5 and L5-S1 is associated with mild  edema be a cause of back pain or referred facet syndrome pain.    Currently in Pain? No/denies    Pain Orientation Right;Left    Pain Type Chronic pain                [facility] PT Assessment - 07/05/21 0001       Assessment   Medical Diagnosis Low back pain, unspecified back pain laterality, unspecified chronicity, unspecified whether sciatica present (M54.50)    Referring Provider (PT) Aundra Dubin, PA-C      Observation/Other Assessments   Focus on Therapeutic Outcomes (FOTO)  55% predicted                                   PT Education - 07/05/21 1406     Education Details extensivley reviewed HEP and discussed proper progression of strengthening with gradual increase in reps/ sets and resistance, and gradual increase in time working on aerobic equipment.  Person(s) Educated Patient    Methods Explanation;Verbal cues;Handout    Comprehension Verbalized understanding;Verbal cues required              PT Short Term Goals - 07/05/21 1353       PT SHORT TERM GOAL #1   Title to be I with inital HEP    Period Weeks    Status Achieved      PT SHORT TERM GOAL #2   Title to demo / verbalize proper posture and lifting/ carrying techniques to prevent and reduce low back/ hip pain    Period Weeks    Status Achieved               PT Long Term Goals - 07/05/21 1353       PT LONG TERM GOAL #1   Title pt to reduce low back spasm and pain to </= 2/10 at max to promote QOL    Period Weeks    Status Partially Met      PT LONG TERM GOAL #2   Title pt to be able to stand and walk for >/= 45 min to promote in home and short distance community ambulation with </= 2/10 pain    Baseline reports more issue with asthma limiting walking, but reports max of 20 min     Status Partially Met      PT LONG TERM GOAL #3   Title increase FOTO score to >/=55% to demo improvement in function    Period Weeks    Status Achieved      PT LONG TERM GOAL #4   Title pt to report reduced muscle spasm in her hips/ calfs to </= 1 x week for the last 2 weeks to demo improvement in condition    Period Weeks    Status Achieved      PT LONG TERM GOAL #5   Title pt to be IND with advanced HEP and is able to maintain and progress current LOF IND    Period Weeks    Status Achieved                   Plan - 07/05/21 1413     Clinical Impression Statement [name] has made excellent progress with physical therapy reporting limited no back pain. she does report limited standing tolerance more as a result of her asthma but does report intermittent low back pain. Extensively reviewed HEP and progrssion. she met or partially met all goals today and is able to maintain and progess her current LOF IND.    PT Treatment/Interventions ADLs/Self Care Home Management;Aquatic Therapy;Cryotherapy;Electrical Stimulation;Iontophoresis 52m/ml Dexamethasone;Moist Heat;Ultrasound;Traction;Gait training;Stair training;Therapeutic activities;Therapeutic exercise;Balance training;Neuromuscular re-education;Patient/family education;Manual techniques;Passive range of motion;Dry needling;Taping;Vasopneumatic Device    PT Next Visit Plan Review/ updated HEP PRN, continued working on extension bias as tolerated. core /hip strength/range, posture education    PT Home Exercise Plan [email or web address]    Consulted and Agree with Plan of Care Patient             Patient will benefit from skilled therapeutic intervention in order to improve the following deficits and impairments:  Improper body mechanics, Increased muscle spasms, Decreased strength, Postural dysfunction, Pain, Decreased balance, Decreased endurance, Decreased activity tolerance, Decreased safety awareness, Decreased range of motion,  Decreased mobility, Obesity  Visit Diagnosis: Chronic bilateral low back pain, unspecified whether sciatica present  Abnormal posture  Cramp and spasm     Problem List Patient Active Problem List   Diagnosis Date  Noted   Stage 3a chronic kidney disease ([place]) 01/23/2021   Cataract, age-related 04/27/2020   Multiple somatic complaints 01/10/2019   Adenomatous polyp of colon 05/25/2018   History of colonic polyps 03/06/2017   Asthma, chronic, mild persistent, uncomplicated 50/87/1994   Chronic seasonal allergic rhinitis due to pollen 03/06/2017   Gastroesophageal reflux disease without esophagitis 10/10/2014   Ex-cigarette smoker 09/28/2013   Type 2 diabetes mellitus with complications ([place]) 12/90/4753   Obesity (BMI 30-39.9) 07/12/2012   Chronic back pain 07/12/2012   Hypertension associated with diabetes ([place]) 07/12/2012    [place] 07/05/2021, 2:17 PM  [facility] [address] [place], [place], [address] Phone: [phone]   Fax:  [phone]  Name: [name] MRN: [id number] Date of Birth: 1956/08/14    PHYSICAL THERAPY DISCHARGE SUMMARY  Visits from Start of Care: 8  Current functional level related to goals / functional outcomes: See goals, FOTO 55% function   Remaining deficits: See assessment   Education / Equipment: HEP, theraband, posture, lifting mechanics.    Patient agrees to discharge. Patient goals were partially met. Patient is being discharged due to being pleased with the current functional level.  Auriana Washington PT, DPT, LAT, ATC  07/05/21  2:18 PM

## 2021-07-12 DIAGNOSIS — J45998 Other asthma: Secondary | ICD-10-CM | POA: Diagnosis not present

## 2021-07-15 ENCOUNTER — Other Ambulatory Visit: Payer: Self-pay | Admitting: Family Medicine

## 2021-07-15 DIAGNOSIS — F17200 Nicotine dependence, unspecified, uncomplicated: Secondary | ICD-10-CM

## 2021-07-15 NOTE — Telephone Encounter (Signed)
[name] pt ibuprofen 800 mg. Please advise [facility]

## 2021-07-17 ENCOUNTER — Other Ambulatory Visit: Payer: Self-pay

## 2021-07-17 ENCOUNTER — Ambulatory Visit (INDEPENDENT_AMBULATORY_CARE_PROVIDER_SITE_OTHER): Payer: Medicare Other | Admitting: Emergency Medicine

## 2021-07-17 ENCOUNTER — Encounter: Payer: Self-pay | Admitting: Emergency Medicine

## 2021-07-17 DIAGNOSIS — J4530 Mild persistent asthma, uncomplicated: Secondary | ICD-10-CM

## 2021-07-17 DIAGNOSIS — R6889 Other general symptoms and signs: Secondary | ICD-10-CM | POA: Diagnosis not present

## 2021-07-17 DIAGNOSIS — R0989 Other specified symptoms and signs involving the circulatory and respiratory systems: Secondary | ICD-10-CM | POA: Insufficient documentation

## 2021-07-17 NOTE — Patient Instructions (Signed)
We will perform pulmonary function testing at next office visit. Please continue your Trelegy for now.  Rinse and gargle after using.  We may decide to adjust this medication or change it to an alternative going forward. Keep your albuterol available to use either 1 nebulizer treatment or 2 puffs up to every 4 hours if needed for shortness of breath, chest tightness, wheezing. Follow with Dr. Lamonte Sakai next available with full pulmonary function testing on the same day.

## 2021-07-17 NOTE — Progress Notes (Signed)
Subjective:    Patient ID: [name], female    DOB: [birth year], 65 y.o.   MRN: [id number]  HPI 65 year old former smoker (10-15 pack years) with a history of obesity, diabetes type 2, CKD stage IIIa, hypertension (on ACE inhibitor), GERD, lumbar arthritis.  She had childhood asthma, and carries a diagnosis of adult asthma - pretty much had sx continuously through teen and adult years.  She has minimal cough, she frequently clears her throat. Gets SOB with extended  walking, sometimes in conversation. Minimal wheeze. Does not recall needing prednisone for her breathing.   She was started on Trelegy about 3 yrs ago. Unsure whether it has helped much or changed much She uses albuterol about 0 - 3x a day, varies. Often uses her ProAir w exertion.   No PFT available to me at this time.   Review of Systems As per HPI  Past Medical History:  Diagnosis Date   Arthritis    Asthma    Colonic polyp    GERD (gastroesophageal reflux disease)    Hypertension    Obesity    Pain in limb    Smoker    Swelling of limb      Family History  Problem Relation Age of Onset   Lung cancer Father    Cirrhosis Mother    Cirrhosis Brother    Colon cancer Neg Hx    Colon polyps Neg Hx    Esophageal cancer Neg Hx    Rectal cancer Neg Hx    Stomach cancer Neg Hx      Social History   Socioeconomic History   Marital status: Legally Separated    Spouse name: Not on file   Number of children: Not on file   Years of education: Not on file   Highest education level: Not on file  Occupational History   Not on file  Tobacco Use   Smoking status: Former    Packs/day: 0.25    Years: 10.00    Pack years: 2.50    Types: Cigarettes   Smokeless tobacco: Never   Tobacco comments:    quit oct 2018  Vaping Use   Vaping Use: Never used  Substance and Sexual Activity   Alcohol use: Yes    Comment: socially   Drug use: Yes    Types: Marijuana    Comment: social   Sexual activity:  Not Currently  Other Topics Concern   Not on file  Social History Narrative   Not on file   Social Determinants of Health   Financial Resource Strain: Not on file  Food Insecurity: Not on file  Transportation Needs: Not on file  Physical Activity: Not on file  Stress: Not on file  Social Connections: Not on file  Intimate Partner Violence: Not on file    From [place] Has worked at a window [employer]  No Known Allergies   Outpatient Medications Prior to Visit  Medication Sig Dispense Refill   acetaminophen-codeine (TYLENOL #3) 300-30 MG tablet Take 1 tablet by mouth every 6 (six) hours as needed for moderate pain. 30 tablet 0   albuterol (PROVENTIL) (2.5 MG/3ML) 0.083% nebulizer solution USE 1 VIAL IN NEBULIZER EVERY 4 HOURS AS NEEDED FOR WHEEZING FOR SHORTNESS OF BREATH 1 vial 1   Aspirin-Salicylamide-Caffeine (BC HEADACHE POWDER PO) Take 1 packet by mouth as needed. Reported on 01/09/2016     atorvastatin (LIPITOR) 40 MG tablet Take 1 tablet by mouth once daily 90 tablet 0  Blood Glucose Monitoring Suppl (ACCU-CHEK AVIVA PLUS) w/Device KIT USE AS DIRECTED TO CHECK BLOOD GLUCOSE 1 TO 2 TIMES DAILY 1 kit 0   furosemide (LASIX) 40 MG tablet Take 1 tablet (40 mg total) by mouth daily. 30 tablet 3   glucose blood (ACCU-CHEK AVIVA PLUS) test strip USE ONE STRIP TO CHECK GLUCOSE ONCE TO TWICE DAILY 100 each 0   ibuprofen (ADVIL) 800 MG tablet TAKE 1 TABLET BY MOUTH EVERY 8 HOURS AS NEEDED 90 tablet 0   lisinopril-hydrochlorothiazide (ZESTORETIC) 20-12.5 MG tablet Take 1 tablet by mouth once daily 90 tablet 0   metFORMIN (GLUCOPHAGE) 500 MG tablet Take 1 tablet (500 mg total) by mouth 2 (two) times daily with a meal. 180 tablet 1   methocarbamol (ROBAXIN) 500 MG tablet Take 1 tablet (500 mg total) by mouth 2 (two) times daily as needed. 20 tablet 0   oxybutynin (DITROPAN XL) 5 MG 24 hr tablet Take 1 tablet (5 mg total) by mouth at bedtime. 30 tablet 1   predniSONE (STERAPRED UNI-PAK  21 TAB) 10 MG (21) TBPK tablet Take as directed 21 tablet 0   PROAIR HFA 108 (90 Base) MCG/ACT inhaler Inhale 2 puffs by mouth twice daily 9 g 0   sulfamethoxazole-trimethoprim (BACTRIM DS) 800-160 MG tablet Take 1 tablet by mouth 2 (two) times daily. 20 tablet 0   traMADol (ULTRAM) 50 MG tablet Take 1-2 tablets (50-100 mg total) by mouth 3 (three) times daily as needed. 30 tablet 0   TRELEGY ELLIPTA 200-62.5-25 MCG/INH AEPB INHALE 1 PUFF INTO LUNGS ONCE DAILY 60 each 1   Facility-Administered Medications Prior to Visit  Medication Dose Route Frequency Provider Last Rate Last Admin   0.9 %  sodium chloride infusion  500 mL Intravenous Once Irene Shipper, MD             Objective:   Physical Exam Vitals:   07/17/21 1443  BP: 124/82  Pulse: 73  Temp: 98.2 F (36.8 C)  TempSrc: Oral  SpO2: 98%  Weight: 224 lb 6.4 oz (101.8 kg)  Height: 5' 7"  (1.702 m)    Gen: Pleasant, obese woman, in no distress,  normal affect  ENT: No lesions,  mouth clear,  oropharynx clear, no postnasal drip  Neck: No JVD, no stridor  Lungs: No use of accessory muscles, no crackles or wheezing on normal respiration, no wheeze on forced expiration  Cardiovascular: RRR, heart sounds normal, no murmur or gallops, no peripheral edema  Musculoskeletal: No deformities, no cyanosis or clubbing  Neuro: alert, awake, non focal  Skin: Warm, no lesions or rash       Assessment & Plan:   Chronic throat clearing She has a lot of chronic throat clearing and asthma symptoms, dyspnea when exposed to triggers.  Question whether lisinopril may be a contributor here.  She does not have overt cough and overall seems to have tolerated it except for the throat clearing.  There may be benefit to changing to an ARB  Asthma, chronic, mild persistent, uncomplicated Symptoms difficult to tease out but it does sound like she has exertional dyspnea, intermittent wheeze, benefit from albuterol although she has not really  benefited from the Trelegy.  She needs pulmonary function testing to quantify her degree of obstruction.  Depending on results we will decide which bronchodilators need to be continued.  Some of her intermittent symptoms may be upper airway in nature.  Will try to address contributors.   Baltazar Apo, MD, PhD 07/17/2021, 3:02  PM [facility] [phone] or if no answer before 7:00PM call [phone] For any issues after 7:00PM please call eLink [phone]

## 2021-07-17 NOTE — Assessment & Plan Note (Signed)
Symptoms difficult to tease out but it does sound like she has exertional dyspnea, intermittent wheeze, benefit from albuterol although she has not really benefited from the Trelegy.  She needs pulmonary function testing to quantify her degree of obstruction.  Depending on results we will decide which bronchodilators need to be continued.  Some of her intermittent symptoms may be upper airway in nature.  Will try to address contributors.

## 2021-07-17 NOTE — Assessment & Plan Note (Signed)
She has a lot of chronic throat clearing and asthma symptoms, dyspnea when exposed to triggers.  Question whether lisinopril may be a contributor here.  She does not have overt cough and overall seems to have tolerated it except for the throat clearing.  There may be benefit to changing to an ARB

## 2021-07-17 NOTE — Addendum Note (Signed)
Addended by: Gavin Potters R on: 07/17/2021 03:55 PM   Modules accepted: Orders

## 2021-07-24 ENCOUNTER — Ambulatory Visit: Payer: Medicare Other | Admitting: Family Medicine

## 2021-08-08 ENCOUNTER — Other Ambulatory Visit: Payer: Self-pay | Admitting: Family Medicine

## 2021-08-08 DIAGNOSIS — I152 Hypertension secondary to endocrine disorders: Secondary | ICD-10-CM

## 2021-08-08 DIAGNOSIS — E1159 Type 2 diabetes mellitus with other circulatory complications: Secondary | ICD-10-CM

## 2021-08-09 ENCOUNTER — Ambulatory Visit (INDEPENDENT_AMBULATORY_CARE_PROVIDER_SITE_OTHER): Payer: Medicare Other | Admitting: Family Medicine

## 2021-08-09 ENCOUNTER — Other Ambulatory Visit: Payer: Self-pay

## 2021-08-09 VITALS — BP 120/72 | HR 80 | Temp 98.5°F | Wt 223.8 lb

## 2021-08-09 DIAGNOSIS — M5416 Radiculopathy, lumbar region: Secondary | ICD-10-CM | POA: Diagnosis not present

## 2021-08-09 DIAGNOSIS — R609 Edema, unspecified: Secondary | ICD-10-CM | POA: Diagnosis not present

## 2021-08-09 NOTE — Progress Notes (Signed)
   Subjective:    Patient ID: [name], female    DOB: [birth year], 65 y.o.   MRN: [id number]  HPI She is here for recheck on her dependent edema.  At this point the edema has diminished and she is not having any difficulty with that.  She is scheduled to be seen by pulmonary for her continued difficulty of shortness of breath and is also scheduled to continue to have her back evaluated for chronic back pain.  Alert and   Review of Systems     Objective:   Physical Exam In no distress.  Exam of her lower extremities shows no edema with a negative Homans' sign.       Assessment & Plan:  Lumbar radiculopathy  Dependent edema No further intervention concerning the edema.  She will follow-up concerning her back and with her shortness of breath symptoms.

## 2021-08-15 ENCOUNTER — Other Ambulatory Visit: Payer: Self-pay | Admitting: Family Medicine

## 2021-08-15 NOTE — Telephone Encounter (Signed)
Is this okay to refill? 

## 2021-08-22 ENCOUNTER — Other Ambulatory Visit: Payer: Self-pay | Admitting: Family Medicine

## 2021-08-22 ENCOUNTER — Telehealth: Payer: Self-pay | Admitting: Family Medicine

## 2021-08-22 DIAGNOSIS — J4530 Mild persistent asthma, uncomplicated: Secondary | ICD-10-CM

## 2021-08-22 NOTE — Progress Notes (Signed)
  Chronic Care Management   Note  08/22/2021 Name: Eddie Koc MRN: [id number] DOB: 07/05/1956  [name] Kristiann Noyce is a 65 y.o. year old female who is a primary care patient of [employer], Elyse Jarvis, MD. I reached out to Marissa Nestle by phone today in response to a referral sent by Ms. Nakya Kalman Drape Daniels's PCP, [name], MD.   Ms. Barbara Ahart was given information about Chronic Care Management services today including:  CCM service includes personalized support from designated clinical staff supervised by her physician, including individualized plan of care and coordination with other care providers 24/7 contact phone numbers for assistance for urgent and routine care needs. Service will only be billed when office clinical staff spend 20 minutes or more in a month to coordinate care. Only one practitioner may furnish and bill the service in a calendar month. The patient may stop CCM services at any time (effective at the end of the month) by phone call to the office staff.   Patient agreed to services and verbal consent obtained.   Follow up plan:   Tatjana Secretary/administrator

## 2021-08-22 NOTE — Chronic Care Management (AMB) (Signed)
  Chronic Care Management   Outreach Note  08/22/2021 Name: [name] MRN: [id number] DOB: [birth year]  Referred by: [name], MD Reason for referral : No chief complaint on file.   An unsuccessful telephone outreach was attempted today. The patient was referred to the pharmacist for assistance with care management and care coordination.   Follow Up Plan:   Tatjana Dellinger Upstream Scheduler

## 2021-08-22 NOTE — Telephone Encounter (Signed)
[facility] to fill pt albuterol inhaler . Please advise [facility]

## 2021-08-27 ENCOUNTER — Encounter: Payer: Self-pay | Admitting: Internal Medicine

## 2021-08-29 ENCOUNTER — Ambulatory Visit: Payer: Medicare Other | Admitting: Emergency Medicine

## 2021-09-19 ENCOUNTER — Telehealth: Payer: Self-pay

## 2021-09-19 DIAGNOSIS — J4530 Mild persistent asthma, uncomplicated: Secondary | ICD-10-CM

## 2021-09-19 MED ORDER — PROAIR HFA 108 (90 BASE) MCG/ACT IN AERS: 2.0000 | INHALATION_SPRAY | Freq: Two times a day (BID) | RESPIRATORY_TRACT | 0 refills | Status: DC

## 2021-09-19 NOTE — Telephone Encounter (Signed)
Pt called to request a refill on her pro air inhaler . Please advise [facility]

## 2021-09-21 ENCOUNTER — Other Ambulatory Visit: Payer: Self-pay | Admitting: Family Medicine

## 2021-09-21 DIAGNOSIS — J4530 Mild persistent asthma, uncomplicated: Secondary | ICD-10-CM

## 2021-09-23 ENCOUNTER — Ambulatory Visit (INDEPENDENT_AMBULATORY_CARE_PROVIDER_SITE_OTHER): Payer: Medicare Other | Admitting: Family Medicine

## 2021-09-23 ENCOUNTER — Other Ambulatory Visit: Payer: Self-pay

## 2021-09-23 ENCOUNTER — Telehealth: Payer: Self-pay | Admitting: Orthopaedic Surgery

## 2021-09-23 VITALS — BP 158/76 | HR 83 | Temp 99.7°F | Wt 222.2 lb

## 2021-09-23 DIAGNOSIS — M25521 Pain in right elbow: Secondary | ICD-10-CM | POA: Diagnosis not present

## 2021-09-23 NOTE — Telephone Encounter (Signed)
[facility] is requesting to fill pt pro air and treglegy. Please advise [facility]

## 2021-09-23 NOTE — Telephone Encounter (Signed)
Patient called advised [facility] will be faxing information over to Dr Erlinda Hong to complete for her so that she can have [facility] bring her to her appointments.    Patient said Sharilyn Sites will be asking for information concerning her back and hip. The number to contact patient is [phone]

## 2021-09-23 NOTE — Progress Notes (Signed)
   Subjective:    Patient ID: [name], female    DOB: 10-17-1956, 65 y.o.   MRN: [id number]  HPI She complains of a 1 month history of right elbow pain.  No history of injury or overuse.  She states that it hurts her when she supinates or pronates.   Review of Systems     Objective:   Physical Exam Pain with both supination and pronation.  Fullness and pain is felt distal medial  to the medial epicondyle.  No tenderness over the medial or lateral epicondyle.       Assessment & Plan:  Right elbow pain - Plan: Ambulatory referral to Orthopedic Surgery Difficult to say exactly what is going on with her elbow and the fullness in that area.  She is going to need further evaluation and I will therefore refer to orthopedics.  She has seen Dr. Erlinda Hong in the past.

## 2021-09-24 NOTE — Telephone Encounter (Signed)
Will wait for paperwork to be faxed.

## 2021-09-30 DIAGNOSIS — J45998 Other asthma: Secondary | ICD-10-CM | POA: Diagnosis not present

## 2021-10-02 ENCOUNTER — Ambulatory Visit (INDEPENDENT_AMBULATORY_CARE_PROVIDER_SITE_OTHER): Payer: Medicare Other | Admitting: Emergency Medicine

## 2021-10-02 ENCOUNTER — Other Ambulatory Visit: Payer: Self-pay

## 2021-10-02 ENCOUNTER — Encounter: Payer: Self-pay | Admitting: Emergency Medicine

## 2021-10-02 DIAGNOSIS — K219 Gastro-esophageal reflux disease without esophagitis: Secondary | ICD-10-CM | POA: Diagnosis not present

## 2021-10-02 DIAGNOSIS — J4530 Mild persistent asthma, uncomplicated: Secondary | ICD-10-CM

## 2021-10-02 DIAGNOSIS — R0989 Other specified symptoms and signs involving the circulatory and respiratory systems: Secondary | ICD-10-CM | POA: Diagnosis not present

## 2021-10-02 LAB — PULMONARY FUNCTION TEST
DL/VA % pred: 80 %
DL/VA: 3.34 ml/min/mmHg/L
DLCO cor % pred: 67 %
DLCO cor: 13.71 ml/min/mmHg
DLCO unc % pred: 67 %
DLCO unc: 13.71 ml/min/mmHg
FEF 25-75 Post: 0.98 L/sec
FEF 25-75 Pre: 0.73 L/sec
FEF2575-%Change-Post: 34 %
FEF2575-%Pred-Post: 52 %
FEF2575-%Pred-Pre: 38 %
FEV1-%Change-Post: 7 %
FEV1-%Pred-Post: 67 %
FEV1-%Pred-Pre: 63 %
FEV1-Post: 1.36 L
FEV1-Pre: 1.27 L
FEV1FVC-%Change-Post: 2 %
FEV1FVC-%Pred-Pre: 88 %
FEV6-%Change-Post: 4 %
FEV6-%Pred-Post: 77 %
FEV6-%Pred-Pre: 74 %
FEV6-Post: 1.93 L
FEV6-Pre: 1.85 L
FEV6FVC-%Change-Post: 0 %
FEV6FVC-%Pred-Post: 103 %
FEV6FVC-%Pred-Pre: 103 %
FVC-%Change-Post: 4 %
FVC-%Pred-Post: 74 %
FVC-%Pred-Pre: 71 %
FVC-Post: 1.93 L
FVC-Pre: 1.85 L
Post FEV1/FVC ratio: 71 %
Post FEV6/FVC ratio: 100 %
Pre FEV1/FVC ratio: 69 %
Pre FEV6/FVC Ratio: 100 %
RV % pred: 149 %
RV: 3.25 L
TLC % pred: 101 %
TLC: 5.3 L

## 2021-10-02 MED ORDER — PANTOPRAZOLE SODIUM 40 MG PO TBEC: 40.0000 mg | DELAYED_RELEASE_TABLET | Freq: Every day | ORAL | 11 refills | Status: DC

## 2021-10-02 NOTE — Progress Notes (Signed)
Subjective:    Patient ID: [name], female    DOB: Mar 18, 1956, 65 y.o.   MRN: [id number]  HPI 65 year old former smoker (10-15 pack years) with a history of obesity, diabetes type 2, CKD stage IIIa, hypertension (on ACE inhibitor), GERD, lumbar arthritis.  She had childhood asthma, and carries a diagnosis of adult asthma - pretty much had sx continuously through teen and adult years.  She has minimal cough, she frequently clears her throat. Gets SOB with extended  walking, sometimes in conversation. Minimal wheeze. Does not recall needing prednisone for her breathing.   She was started on Trelegy about 3 yrs ago. Unsure whether it has helped much or changed much She uses albuterol about 0 - 3x a day, varies. Often uses her ProAir w exertion.   No PFT available to me at this time.   ROV 10/02/21 --follow-up visit 65 year old woman, former tobacco use.  Also with a history of obesity, diabetes, CKD, hypertension, GERD.  She had childhood asthma and has been treated for adult asthma when we met in August.  She returns today reporting that she is still experiencing some exertional SOB, has to rest. She uses albuterol with exertion. She is on Trelegy and believes it may help her - not sure. She does still clear her throat. She believes that she has GERD sx, happens at night.   Pulmonary function testing performed today and reviewed by me shows moderately severe obstruction without a bronchodilator response, some hyperinflation of her lung volumes based on RV, decreased diffusion capacity that corrects to the normal range when adjusted for alveolar volume.   Review of Systems As per HPI  Past Medical History:  Diagnosis Date   Arthritis    Asthma    Colonic polyp    GERD (gastroesophageal reflux disease)    Hypertension    Obesity    Pain in limb    Smoker    Swelling of limb      Family History  Problem Relation Age of Onset   Lung cancer Father    Cirrhosis Mother     Cirrhosis Brother    Colon cancer Neg Hx    Colon polyps Neg Hx    Esophageal cancer Neg Hx    Rectal cancer Neg Hx    Stomach cancer Neg Hx      Social History   Socioeconomic History   Marital status: Legally Separated    Spouse name: Not on file   Number of children: Not on file   Years of education: Not on file   Highest education level: Not on file  Occupational History   Not on file  Tobacco Use   Smoking status: Former    Packs/day: 0.25    Years: 10.00    Pack years: 2.50    Types: Cigarettes   Smokeless tobacco: Never   Tobacco comments:    quit oct 2018  Vaping Use   Vaping Use: Never used  Substance and Sexual Activity   Alcohol use: Yes    Comment: socially   Drug use: Yes    Types: Marijuana    Comment: social   Sexual activity: Not Currently  Other Topics Concern   Not on file  Social History Narrative   Not on file   Social Determinants of Health   Financial Resource Strain: Not on file  Food Insecurity: Not on file  Transportation Needs: Not on file  Physical Activity: Not on file  Stress: Not on  file  Social Connections: Not on file  Intimate Partner Violence: Not on file    From [place] Has worked at a window [employer]  No Known Allergies   Outpatient Medications Prior to Visit  Medication Sig Dispense Refill   Aspirin-Salicylamide-Caffeine (BC HEADACHE POWDER PO) Take 1 packet by mouth as needed. Reported on 01/09/2016     atorvastatin (LIPITOR) 40 MG tablet Take 1 tablet by mouth once daily 90 tablet 0   Blood Glucose Monitoring Suppl (ACCU-CHEK AVIVA PLUS) w/Device KIT USE AS DIRECTED TO CHECK BLOOD GLUCOSE 1 TO 2 TIMES DAILY 1 kit 0   furosemide (LASIX) 40 MG tablet Take 1 tablet (40 mg total) by mouth daily. 30 tablet 3   glucose blood (ACCU-CHEK AVIVA PLUS) test strip USE ONE STRIP TO CHECK GLUCOSE ONCE TO TWICE DAILY 100 each 0   ibuprofen (ADVIL) 800 MG tablet TAKE 1 TABLET BY MOUTH EVERY 8 HOURS AS NEEDED 90 tablet 0    lisinopril-hydrochlorothiazide (ZESTORETIC) 20-12.5 MG tablet Take 1 tablet by mouth once daily 90 tablet 0   metFORMIN (GLUCOPHAGE) 500 MG tablet Take 1 tablet (500 mg total) by mouth 2 (two) times daily with a meal. 180 tablet 1   oxybutynin (DITROPAN XL) 5 MG 24 hr tablet Take 1 tablet (5 mg total) by mouth at bedtime. 30 tablet 1   PROAIR HFA 108 (90 Base) MCG/ACT inhaler Inhale 2 puffs by mouth twice daily 9 g 0   traMADol (ULTRAM) 50 MG tablet Take 1-2 tablets (50-100 mg total) by mouth 3 (three) times daily as needed. 30 tablet 0   TRELEGY ELLIPTA 200-62.5-25 MCG/ACT AEPB INHALE 1 PUFF INTO LUNGS ONCE DAILY 60 each 0   acetaminophen-codeine (TYLENOL #3) 300-30 MG tablet Take 1 tablet by mouth every 6 (six) hours as needed for moderate pain. (Patient not taking: No sig reported) 30 tablet 0   methocarbamol (ROBAXIN) 500 MG tablet Take 1 tablet (500 mg total) by mouth 2 (two) times daily as needed. (Patient not taking: No sig reported) 20 tablet 0   predniSONE (STERAPRED UNI-PAK 21 TAB) 10 MG (21) TBPK tablet Take as directed (Patient not taking: No sig reported) 21 tablet 0   sulfamethoxazole-trimethoprim (BACTRIM DS) 800-160 MG tablet Take 1 tablet by mouth 2 (two) times daily. (Patient not taking: No sig reported) 20 tablet 0   Facility-Administered Medications Prior to Visit  Medication Dose Route Frequency Provider Last Rate Last Admin   0.9 %  sodium chloride infusion  500 mL Intravenous Once Irene Shipper, MD             Objective:   Physical Exam Vitals:   10/02/21 1202  BP: (!) 130/54  Pulse: 72  Temp: 98.1 F (36.7 C)  TempSrc: Oral  SpO2: 95%  Weight: 224 lb 3.2 oz (101.7 kg)  Height: _0  (1.727 m)    Gen: Pleasant, obese woman, in no distress,  normal affect  ENT: No lesions,  mouth clear,  oropharynx clear, no postnasal drip  Neck: No JVD, no stridor  Lungs: No use of accessory muscles, no crackles or wheezing on normal respiration, no wheeze on forced  expiration  Cardiovascular: RRR, heart sounds normal, no murmur or gallops, no peripheral edema  Musculoskeletal: No deformities, no cyanosis or clubbing  Neuro: alert, awake, non focal  Skin: Warm, no lesions or rash       Assessment & Plan:   Asthma, chronic, mild persistent, uncomplicated Her pulmonary function testing confirm obstruction, mainly  fixed obstruction at this point, asthma/COPD.  She is on Trelegy and feels that she may be benefiting.  I think we should continue although may need to consider a change to an nonpowered alternative depending on her upper airway irritation, throat clearing.  Continue albuterol as needed  Gastroesophageal reflux disease without esophagitis She complains of daily symptoms.  Not currently on therapy.  I will start pantoprazole to see if she gets benefit.  Chronic throat clearing Not so much overt cough.  I will start an empiric PPI since this may benefit not only throat clearing but also her asthma.  Consider changing her ACE inhibitor to an ARB.  I have asked her to talk to Dr. Redmond School about this.   Baltazar Apo, MD, PhD 10/02/2021, 12:38 PM [facility] [phone] or if no answer before 7:00PM call [phone] For any issues after 7:00PM please call eLink [phone]

## 2021-10-02 NOTE — Assessment & Plan Note (Signed)
Not so much overt cough.  I will start an empiric PPI since this may benefit not only throat clearing but also her asthma.  Consider changing her ACE inhibitor to an ARB.  I have asked her to talk to Dr. Redmond School about this.

## 2021-10-02 NOTE — Assessment & Plan Note (Signed)
Her pulmonary function testing confirm obstruction, mainly fixed obstruction at this point, asthma/COPD.  She is on Trelegy and feels that she may be benefiting.  I think we should continue although may need to consider a change to an nonpowered alternative depending on her upper airway irritation, throat clearing.  Continue albuterol as needed

## 2021-10-02 NOTE — Assessment & Plan Note (Signed)
She complains of daily symptoms.  Not currently on therapy.  I will start pantoprazole to see if she gets benefit.

## 2021-10-02 NOTE — Progress Notes (Signed)
PFT done today. 

## 2021-10-02 NOTE — Patient Instructions (Addendum)
Continue your Trelegy once daily for now.  Rinse and gargle after using.  Depending on how you are doing we may decide to change this to a nonpowdered medication in the future to prevent throat irritation. Keep your albuterol available to use 2 puffs when needed for shortness of breath, chest tightness, wheezing. Start pantoprazole 40 mg once daily.  Take this medication 1 hour around food. You might benefit from changing her blood pressure medicine.  Currently you are on lisinopril/hydrochlorothiazide which can be associated with throat irritation and cough.  Talk to Dr. Redmond School about this. Follow with Dr Lamonte Sakai in 6 months or sooner if you have any problems

## 2021-10-04 ENCOUNTER — Telehealth: Payer: Self-pay | Admitting: Pharmacist

## 2021-10-04 NOTE — Chronic Care Management (AMB) (Signed)
Chronic Care Management Pharmacy Assistant   Name: [name]  MRN: [id number] DOB: 1956-10-15  [name] is an 65 y.o. year old female who presents for her initial CCM visit with the clinical pharmacist.  Reason for Encounter: Chart Prep for initial visit with Jeni Salles, Pharm D. On 10/10/21.    Conditions to be addressed/monitored: HTN, DMII, CKD Stage 3, Asthma, and chronic seasonal allergic rhinitis due to pollen, gastroesophageal reflux disease without esophagitis and obesity.    Recent office visits:  09/23/21 Jill Alexanders MD (PCP) -seen for right elbow pain. Referral to orthopedic surgery placed. No medication changes. No follow up noted.   08/09/21 Jill Alexanders MD (PCP) - seen for lumbar radiculopathy. No medication changes. Follow up concerning her back and with her shortness of breath.   05/23/21 Jill Alexanders MD (PCP) - seen for lumbar radiculopathy. Referral to pulmonology placed. No medication changes. No follow up noted.   Recent consult visits:  10/02/21 Baltazar Apo MD (Pulmonary Disease) - seen for asthma and other issues. Patient started on pantoprazole 62m. Follow up in 6 months.   10/02/21 RBaltazar ApoMD (Pulmonary Disease) - seen for asthma. No medication changes. PFT performed. No follow up noted.   07/17/21 RBaltazar ApoMD (Pulmonary Disease) - seen for chronic throat clearing. No medication changes. Full PFT at next office visit and follow up with Dr. BLamonte Sakai   07/05/21 Kristoffer Leamon PT (Physical Therapy) - seen for chronic bilateral low back pain, unspecified whether sciatica present and other issues. No medication changes. Perform self exercises at home. No follow up noted.   05/27/21 [facility]. Seen for low back pain and other issues. Self care exercises. No medication changes or follow up noted.   Hospital visits:  None in previous 6 months  Medications: Outpatient Encounter  Medications as of 10/04/2021  Medication Sig Note   acetaminophen-codeine (TYLENOL #3) 300-30 MG tablet Take 1 tablet by mouth every 6 (six) hours as needed for moderate pain. (Patient not taking: No sig reported)    Aspirin-Salicylamide-Caffeine (BC HEADACHE POWDER PO) Take 1 packet by mouth as needed. Reported on 01/09/2016 11/29/2012: Prn    atorvastatin (LIPITOR) 40 MG tablet Take 1 tablet by mouth once daily    Blood Glucose Monitoring Suppl (ACCU-CHEK AVIVA PLUS) w/Device KIT USE AS DIRECTED TO CHECK BLOOD GLUCOSE 1 TO 2 TIMES DAILY    furosemide (LASIX) 40 MG tablet Take 1 tablet (40 mg total) by mouth daily.    glucose blood (ACCU-CHEK AVIVA PLUS) test strip USE ONE STRIP TO CHECK GLUCOSE ONCE TO TWICE DAILY    ibuprofen (ADVIL) 800 MG tablet TAKE 1 TABLET BY MOUTH EVERY 8 HOURS AS NEEDED    lisinopril-hydrochlorothiazide (ZESTORETIC) 20-12.5 MG tablet Take 1 tablet by mouth once daily    metFORMIN (GLUCOPHAGE) 500 MG tablet Take 1 tablet (500 mg total) by mouth 2 (two) times daily with a meal.    methocarbamol (ROBAXIN) 500 MG tablet Take 1 tablet (500 mg total) by mouth 2 (two) times daily as needed. (Patient not taking: No sig reported)    oxybutynin (DITROPAN XL) 5 MG 24 hr tablet Take 1 tablet (5 mg total) by mouth at bedtime.    pantoprazole (PROTONIX) 40 MG tablet Take 1 tablet (40 mg total) by mouth daily.    predniSONE (STERAPRED UNI-PAK 21 TAB) 10 MG (21) TBPK tablet Take as directed (Patient not taking: No sig reported)  PROAIR HFA 108 (90 Base) MCG/ACT inhaler Inhale 2 puffs by mouth twice daily    sulfamethoxazole-trimethoprim (BACTRIM DS) 800-160 MG tablet Take 1 tablet by mouth 2 (two) times daily. (Patient not taking: No sig reported)    traMADol (ULTRAM) 50 MG tablet Take 1-2 tablets (50-100 mg total) by mouth 3 (three) times daily as needed.    TRELEGY ELLIPTA 200-62.5-25 MCG/ACT AEPB INHALE 1 PUFF INTO LUNGS ONCE DAILY    Facility-Administered Encounter Medications as of  10/04/2021  Medication   0.9 %  sodium chloride infusion   Fill History: PROAIR HFA          AER 09/25/2021 25   ATORVASTATIN 40MG   TAB 09/23/2021 90   TRELEGY ELLIPTA 200-62.5-25 AE 09/23/2021 30   AQUALANCE LANCETS UL     TRA THIN 3 0G  MISC 08/07/2021 90   LISINOPRIL/HCTZ 20-12.5MG TAB 08/09/2021 90   METFORMIN 500MG TAB 06/24/2021 90   Initial Questions: Have you seen any other providers since your last visit?   Any changes in your medications or health?   Any side effects from any medications?   Do you have an symptoms or problems not managed by your medications?   Any concerns about your health right now?   Has your provider asked that you check blood pressure, blood sugar, or follow special diet at home?   Do you get any type of exercise on a regular basis?   Can you think of a goal you would like to reach for your health?   Do you have any problems getting your medications?   Is there anything that you would like to discuss during the appointment?   Left detailed message of appointment date and time. Patient reminded to Please bring medications and supplements to appointment.  Care Gaps:  AWV - overdue Pneumonia vaccine - overdue since 2020 Dexa scan - never done Covid-19 vaccine booster 3 - overdue since 01/03/21 Colonoscopy - overdue since 07/01/21 Hemoglobin A1C - overdue Last PCP BP: 130/54 P: 72 on 10/02/21 Last A1C: Lab Results  Component Value Date   HGBA1C 7.8 (A) 03/27/2021    Star Rating Drugs:  Atorvastatin 28m - [facility] [place] [facility].546[facility]- [place] [id number]

## 2021-10-07 NOTE — Telephone Encounter (Signed)
2nd attempt

## 2021-10-08 ENCOUNTER — Ambulatory Visit (INDEPENDENT_AMBULATORY_CARE_PROVIDER_SITE_OTHER): Payer: Medicare Other | Admitting: Orthopaedic Surgery

## 2021-10-08 ENCOUNTER — Other Ambulatory Visit: Payer: Self-pay

## 2021-10-08 ENCOUNTER — Encounter: Payer: Self-pay | Admitting: Orthopaedic Surgery

## 2021-10-08 ENCOUNTER — Ambulatory Visit: Payer: Self-pay

## 2021-10-08 DIAGNOSIS — G8929 Other chronic pain: Secondary | ICD-10-CM

## 2021-10-08 DIAGNOSIS — M545 Low back pain, unspecified: Secondary | ICD-10-CM | POA: Diagnosis not present

## 2021-10-08 DIAGNOSIS — M79601 Pain in right arm: Secondary | ICD-10-CM | POA: Diagnosis not present

## 2021-10-08 MED ORDER — METHOCARBAMOL 500 MG PO TABS: 500.0000 mg | ORAL_TABLET | Freq: Two times a day (BID) | ORAL | 2 refills | Status: DC | PRN

## 2021-10-08 MED ORDER — PREDNISONE 5 MG (21) PO TBPK: ORAL_TABLET | ORAL | 0 refills | Status: DC

## 2021-10-08 NOTE — Progress Notes (Signed)
Office Visit Note   Patient: [name]           Date of Birth: Sep 05, 1956           MRN: [id number] Visit Date: 10/08/2021              Requested by: [name], MD [place],  [place] [id number] PCP: [name], MD   Assessment & Plan: Visit Diagnoses:  1. Right arm pain   2. Chronic low back pain, unspecified back pain laterality, unspecified whether sciatica present     Plan: Impression is chronic low back pain with evidence of disc bulging, lateral and recess stenosis as well as facet changes primarily at L4-5 and L5-S1.  The patient has tried physical therapy as well as medication without long-lasting relief.  We have discussed referral to Dr. Ernestina Patches for injection.  She is not interested in this for now.  We have discussed referral to Dr. Donavan Burnet or Lorin Mercy for further evaluation and treatment recommendation.  In regards to the arm pain, do not feel that her symptoms are actually coming from her shoulder as she is having primarily forearm pain when ranging the shoulder as well as when performing special tests.  I believe she could be getting referred pain from her neck.  We have discussed starting her on a low-dose steroid taper for this.  She will follow-up with [place] as needed.  Follow-Up Instructions: Return for f/u with Dr. Ernestina Patches for ESI vs facet block.   Orders:  Orders Placed This Encounter  Procedures   XR Cervical Spine 2 or 3 views    Meds ordered this encounter  Medications   predniSONE (STERAPRED UNI-PAK 21 TAB) 5 MG (21) TBPK tablet    Sig: Take as directed    Dispense:  21 tablet    Refill:  0   methocarbamol (ROBAXIN) 500 MG tablet    Sig: Take 1 tablet (500 mg total) by mouth 2 (two) times daily as needed.    Dispense:  20 tablet    Refill:  2       Procedures: No procedures performed   Clinical Data: No additional findings.   Subjective: Chief Complaint  Patient presents with   Right Arm - Pain   Lower Back  - Pain    HPI patient is a pleasant 65 year old female who comes in today with chronic low back pain in addition to new onset right arm pain.  In regards to her back, she has had pain for nearly a year.  She has been seen by [place] for this where she has been on a steroid taper as well as been sent to physical therapy all without long-lasting relief of symptoms.  Previous MRI showed moderate disc bulging with lateral recess and foraminal stenosis right greater than left L4-5, L5-S1 chronic disc degeneration with disc height loss and endplate osteophytes and bulging of disc.  She has not undergone epidural steroid injection or facet block.  She is also complaining of pain throughout the entire right arm.  Pain is constant but worse with pronation of the forearm.  She denies any paresthesias.  Review of Systems as detailed in HPI.  All others reviewed and are negative.   Objective: Vital Signs: There were no vitals taken for this visit.  Physical Exam well-developed well-nourished female no acute distress.  Alert and oriented x3.  Ortho Exam Lumbar spine exam shows no spinous tenderness.  She does have paraspinous tenderness  both sides.  Negative logroll negative FADIR.  Negative straight leg raise.  No focal weakness.  Right shoulder exam shows forward flexion to approximately 160 degrees.  Negative empty can, cross body adduction, speeds and O'Brien's testing.  Full range of motion of the elbow without pain.  She has increased pain with forearm with pronation.  She is neurovascular intact distally.  Cervical spine shows no spinous tenderness.  She has slight right-sided paraspinous tenderness.  Mild discomfort with rotation, flexion and extension.  She is neurovascular intact distally. Specialty Comments:  No specialty comments available.  Imaging: XR Cervical Spine 2 or 3 views  Result Date: 10/08/2021 Multilevel facet degenerative changes    PMFS History: Patient Active Problem List    Diagnosis Date Noted   Chronic throat clearing 07/17/2021   Stage 3a chronic kidney disease ([place]) 01/23/2021   Cataract, age-related 04/27/2020   Multiple somatic complaints 01/10/2019   Adenomatous polyp of colon 05/25/2018   History of colonic polyps 03/06/2017   Asthma, chronic, mild persistent, uncomplicated 04/54/0981   Chronic seasonal allergic rhinitis due to pollen 03/06/2017   Gastroesophageal reflux disease without esophagitis 10/10/2014   Ex-cigarette smoker 09/28/2013   Type 2 diabetes mellitus with complications ([place]) 19/14/7829   Obesity (BMI 30-39.9) 07/12/2012   Chronic back pain 07/12/2012   Hypertension associated with diabetes ([place]) 07/12/2012   Past Medical History:  Diagnosis Date   Arthritis    Asthma    Colonic polyp    GERD (gastroesophageal reflux disease)    Hypertension    Obesity    Pain in limb    Smoker    Swelling of limb     Family History  Problem Relation Age of Onset   Lung cancer Father    Cirrhosis Mother    Cirrhosis Brother    Colon cancer Neg Hx    Colon polyps Neg Hx    Esophageal cancer Neg Hx    Rectal cancer Neg Hx    Stomach cancer Neg Hx     Past Surgical History:  Procedure Laterality Date   BIOPSY BREAST Left 09/22/2018   no maglignancy   COLONOSCOPY  2009   Dr. Henrene Pastor   POLYPECTOMY     Social History   Occupational History   Not on file  Tobacco Use   Smoking status: Former    Packs/day: 0.25    Years: 10.00    Pack years: 2.50    Types: Cigarettes   Smokeless tobacco: Never   Tobacco comments:    quit oct 2018  Vaping Use   Vaping Use: Never used  Substance and Sexual Activity   Alcohol use: Yes    Comment: socially   Drug use: Yes    Types: Marijuana    Comment: social   Sexual activity: Not Currently

## 2021-10-10 ENCOUNTER — Ambulatory Visit (INDEPENDENT_AMBULATORY_CARE_PROVIDER_SITE_OTHER): Payer: Medicare Other | Admitting: Pharmacist

## 2021-10-10 ENCOUNTER — Telehealth: Payer: Self-pay | Admitting: Pharmacist

## 2021-10-10 DIAGNOSIS — N1831 Chronic kidney disease, stage 3a: Secondary | ICD-10-CM

## 2021-10-10 DIAGNOSIS — E118 Type 2 diabetes mellitus with unspecified complications: Secondary | ICD-10-CM

## 2021-10-10 NOTE — Patient Instructions (Signed)
Hi [name],  It was great to get to meet you over the telephone! Below is a summary of some of the topics we discussed.   Don't forget to start checking your blood pressure routinely at home so we can make sure your medication is working properly.   Please reach out to me if you have any questions or need anything before our follow up!  Best, Maddie  Jeni Salles, PharmD, [facility] [phone]   Visit Information   Goals Addressed             This Visit's Progress    Monitor and Manage My Blood Sugar-Diabetes Type 2       Timeframe:  Long-Range Goal Priority:  High Start Date:                             Expected End Date:                       Follow Up Date 12/10/21    - check blood sugar at prescribed times - check blood sugar if I feel it is too high or too low    Why is this important?   Checking your blood sugar at home helps to keep it from getting very high or very low.  Writing the results in a diary or log helps the doctor know how to care for you.  Your blood sugar log should have the time, date and the results.  Also, write down the amount of insulin or other medicine that you take.  Other information, like what you ate, exercise done and how you were feeling, will also be helpful.     Notes:      Track and Manage My Blood Pressure-Hypertension       Timeframe:  Long-Range Goal Priority:  Medium Start Date:                             Expected End Date:                       Follow Up Date 12/10/21    - check blood pressure weekly - choose a place to take my blood pressure (home, clinic or office, retail store) - write blood pressure results in a log or diary    Why is this important?   You won't feel high blood pressure, but it can still hurt your blood vessels.  High blood pressure can cause heart or kidney problems. It can also cause a stroke.  Making lifestyle changes like losing a little weight or  eating less salt will help.  Checking your blood pressure at home and at different times of the day can help to control blood pressure.  If the doctor prescribes medicine remember to take it the way the doctor ordered.  Call the office if you cannot afford the medicine or if there are questions about it.     Notes:        Patient Care Plan: CCM Pharmacy Care Plan     Problem Identified: Problem: Hypertension, Hyperlipidemia, Diabetes, GERD, Asthma, Chronic Kidney Disease, Overactive Bladder, and Allergic Rhinitis      Long-Range Goal: Patient-Specific Goal   Start Date: 10/10/2021  Expected End Date: 10/10/2021  This Visit's Progress: On track  Priority: High  Note:   Current  Barriers:  Unable to independently monitor therapeutic efficacy Unable to maintain control of diabetes  Pharmacist Clinical Goal(s):  Patient will achieve adherence to monitoring guidelines and medication adherence to achieve therapeutic efficacy achieve control of diabetes as evidenced by A1c  through collaboration with PharmD and provider.   Interventions: 1:1 collaboration with [name], MD regarding development and update of comprehensive plan of care as evidenced by provider attestation and co-signature Inter-disciplinary care team collaboration (see longitudinal plan of care) Comprehensive medication review performed; medication list updated in electronic medical record  Hypertension (BP goal <140/90) -Uncontrolled -Current treatment: Lisinopril-HCTZ 20-12.5 mg 1 tablet daily -Medications previously tried: none  -Current home readings: does not check at home (owns a wrist cuff but has not calibrated it with doctor's office) -Current dietary habits: doesn't cook with salt at all except uses some on grits; was eating a lot of bacon but cut back -Current exercise habits: unable to with shortness of breath -Denies hypotensive/hypertensive symptoms -Educated on BP goals and benefits of  medications for prevention of heart attack, stroke and kidney damage; Importance of home blood pressure monitoring; Proper BP monitoring technique; -Counseled to monitor BP at home weekly, document, and provide log at future appointments -Counseled on diet and exercise extensively Recommended to continue current medication  Hyperlipidemia: (LDL goal < 70) -Not ideally controlled -Current treatment: Atorvastatin 40 mg 1 tablet daily -Medications previously tried: none  -Current dietary patterns: not eating much red meat -Current exercise habits: not able to exercise -Educated on Cholesterol goals;  Benefits of statin for ASCVD risk reduction; -Counseled on diet and exercise extensively Recommended to continue current medication  Diabetes (A1c goal <7%) -Uncontrolled -Current medications: Metformin 500 mg 1 tablet twice daily - doesn't always take it twice daily -Medications previously tried: none  -Current home glucose readings fasting glucose: 120-130; 97 (checking once a week) post prandial glucose: n/a -Denies hypoglycemic/hyperglycemic symptoms -Current meal patterns: eating out once or twice a month breakfast: honey nut cheerios   lunch: lunch at the school  dinner: smoothie or heats up something (air fryer); chicken tenders, fish and shrimp and vegetables (spinach, broccoli, string beans) snacks: n/a drinks: orange juice with breakfast -Current exercise: not able to -Educated on A1c and blood sugar goals; Benefits of routine self-monitoring of blood sugar; Carbohydrate counting and/or plate method -Counseled to check feet daily and get yearly eye exams -Counseled on diet and exercise extensively Recommended switching to SGLT2 for kidney benefits.  Asthma (Goal: control symptoms) -Uncontrolled -Current treatment  Trelegy ellipta 200-62.5-25 mcg 1 puff daily Proair HFA 2 puffs twice daily Albuterol nebulizer every 6 hours as needed -Medications previously tried: n/a   -Pulmonary function testing: 10/02/21 -Patient reports consistent use of maintenance inhaler -Frequency of rescue inhaler use: up to 4 times a day -Counseled on Proper inhaler technique; Differences between maintenance and rescue inhalers -Counseled on diet and exercise extensively Recommended to continue current medication  GERD (Goal: minimize symptoms of heart burn) -Controlled -Current treatment  Pantoprazole 40 mg 1 tablet daily -Medications previously tried: none  -Recommended to continue current medication  Pain (Goal: minimize pain) -Not ideally controlled -Current treatment  Methocarbamol 500 mg 1 tablet twice daily as needed Tramadol 50 mg 1-2 tablets three times daily as needed Ibuprofen 800 mg 1 tablet every 8 hours as needed -Medications previously tried: none  -Counseled on limiting use of NSAIDs due to risk for kidney damage and relying on Tylenol.  Swelling (Goal: minimize swelling) -Controlled -Current treatment  Furosemide  40 mg 1 tablet daily as needed -Medications previously tried: none  -Counseled on diet and exercise extensively Recommended to continue current medication  Health Maintenance -Vaccine gaps: COVID booster, Prevnar 20 -Current therapy:  BC headache powder (rarely uses) Multivitamin 1 tablet daily - not taking right now -Educated on Cost vs benefit of each product must be carefully weighed by individual consumer -Patient is satisfied with current therapy and denies issues -Recommended to continue current medication  Patient Goals/Self-Care Activities Patient will:  - take medications as prescribed as evidenced by patient report and record review check glucose weekly, document, and provide at future appointments check blood pressure weekly, document, and provide at future appointments  Follow Up Plan: The care management team will reach out to the patient again over the next 30 days.       Ms. Aleighna Wojtas was given information  about Chronic Care Management services today including:  CCM service includes personalized support from designated clinical staff supervised by her physician, including individualized plan of care and coordination with other care providers 24/7 contact phone numbers for assistance for urgent and routine care needs. Standard insurance, coinsurance, copays and deductibles apply for chronic care management only during months in which we provide at least 20 minutes of these services. Most insurances cover these services at 100%, however patients may be responsible for any copay, coinsurance and/or deductible if applicable. This service may help you avoid the need for more expensive face-to-face services. Only one practitioner may furnish and bill the service in a calendar month. The patient may stop CCM services at any time (effective at the end of the month) by phone call to the office staff.  Patient agreed to services and verbal consent obtained.   Patient verbalizes understanding of instructions provided today and agrees to view in [place].  The pharmacy team will reach out to the patient again over the next 30 days.   Viona Gilmore, [facility]

## 2021-10-10 NOTE — Telephone Encounter (Signed)
  Chronic Care Management   Outreach Note  10/10/2021 Name: [name] MRN: [id number] DOB: [birth year]  Referred by: [name], MD  Patient had a phone appointment scheduled with clinical pharmacist today.  An unsuccessful telephone outreach was attempted today. The patient was referred to the pharmacist for assistance with care management and care coordination.   The care management team will reach out to the patient again over the next 30 days.    Jeni Salles, PharmD, [facility] [phone]

## 2021-10-10 NOTE — Progress Notes (Signed)
Chronic Care Management Pharmacy Note  10/10/2021 Name:  [name] MRN:  [id number] DOB:  09-19-1956  Summary: A1c not at goal < 7% BP not at goal <140/90  Recommendations/Changes made from today's visit: -Recommend switching metformin to [place] or Jardiance for CKD benefit -Recommended reordering DEXA scan -Recommended for patient to start monitoring BP at home -Recommend repeat lipid panel, A1c and BMP  Plan: Follow up DM and BP assessment in 1-2 months Send information to CCM social work to help with housing and transportation  Subjective: [name] is an 65 y.o. year old female who is a primary patient of [employer], Elyse Jarvis, MD.  The CCM team was consulted for assistance with disease management and care coordination needs.    Engaged with patient by telephone for initial visit in response to provider referral for pharmacy case management and/or care coordination services.   Consent to Services:  The patient was given the following information about Chronic Care Management services today, agreed to services, and gave verbal consent: 1. CCM service includes personalized support from designated clinical staff supervised by the primary care provider, including individualized plan of care and coordination with other care providers 2. 24/7 contact phone numbers for assistance for urgent and routine care needs. 3. Service will only be billed when office clinical staff spend 20 minutes or more in a month to coordinate care. 4. Only one practitioner may furnish and bill the service in a calendar month. 5.The patient may stop CCM services at any time (effective at the end of the month) by phone call to the office staff. 6. The patient will be responsible for cost sharing (co-pay) of up to 20% of the service fee (after annual deductible is met). Patient agreed to services and consent obtained.  Patient Care Team: [name], MD as PCP - General (Family  Medicine) Viona Gilmore, [facility] as Pharmacist (Pharmacist)  Recent office visits: 09/23/21 Jill Alexanders MD (PCP) -seen for right elbow pain. Referral to orthopedic surgery placed. No medication changes. No follow up noted.    08/09/21 Jill Alexanders MD (PCP) - seen for lumbar radiculopathy. No medication changes. Follow up concerning her back and with her shortness of breath.    05/23/21 Jill Alexanders MD (PCP) - seen for lumbar radiculopathy. Referral to pulmonology placed. No medication changes. No follow up noted.   Recent consult visits: 10/02/21 Baltazar Apo MD (Pulmonary Disease) - seen for asthma and other issues. Patient started on pantoprazole 75m. Follow up in 6 months.    10/02/21 RBaltazar ApoMD (Pulmonary Disease) - seen for asthma. No medication changes. PFT performed. No follow up noted.    07/17/21 RBaltazar ApoMD (Pulmonary Disease) - seen for chronic throat clearing. No medication changes. Full PFT at next office visit and follow up with Dr. BLamonte Sakai    07/05/21 Kristoffer Leamon PT (Physical Therapy) - seen for chronic bilateral low back pain, unspecified whether sciatica present and other issues. No medication changes. Perform self exercises at home. No follow up noted.    05/27/21 [facility]. Seen for low back pain and other issues. Self care exercises. No medication changes or follow up noted.     Hospital visits: None in previous 6 months   Objective:  Lab Results  Component Value Date   CREATININE 1.27 (H) 01/22/2021   BUN 24 01/22/2021   GFRNONAA 44 (L) 01/22/2021   GFRAA 51 (L) 01/22/2021   NA 145 (  H) 01/22/2021   K 4.2 01/22/2021   CALCIUM 9.5 01/22/2021   CO2 22 01/22/2021   GLUCOSE 172 (H) 01/22/2021    Lab Results  Component Value Date/Time   HGBA1C 7.8 (A) 03/27/2021 03:37 PM   HGBA1C 7.8 (A) 01/31/2021 03:33 PM   HGBA1C 5.4 06/09/2019 12:00 AM   HGBA1C 7.7 05/12/2019 12:00 AM   MICROALBUR 12.0 02/27/2020  04:27 PM   MICROALBUR 15.5 03/01/2015 10:24 AM    Last diabetic Eye exam:  Lab Results  Component Value Date/Time   HMDIABEYEEXA No Retinopathy 05/16/2021 12:00 AM    Last diabetic Foot exam: No results found for: HMDIABFOOTEX   Lab Results  Component Value Date   CHOL 144 08/30/2020   HDL 55 08/30/2020   LDLCALC 71 08/30/2020   TRIG 100 08/30/2020   CHOLHDL 2.6 08/30/2020    Hepatic Function Latest Ref Rng & Units 01/22/2021 08/30/2020 02/27/2020  Total Protein 6.0 - 8.5 g/dL 7.0 6.9 7.4  Albumin 3.8 - 4.8 g/dL 4.4 4.4 4.5  AST 0 - 40 IU/L 17 12 14   ALT 0 - 32 IU/L 29 17 17   Alk Phosphatase 44 - 121 IU/L 75 61 63  Total Bilirubin 0.0 - 1.2 mg/dL 0.3 0.4 0.2    No results found for: TSH, FREET4  CBC Latest Ref Rng & Units 01/22/2021 08/30/2020 02/27/2020  WBC 3.4 - 10.8 x10E3/uL 8.1 7.9 9.4  Hemoglobin 11.1 - 15.9 g/dL 13.9 12.1 12.0  Hematocrit 34.0 - 46.6 % 42.0 35.5 35.6  Platelets 150 - 450 x10E3/uL 269 366 368    No results found for: VD25OH  Clinical ASCVD: No  The 10-year ASCVD risk score (Arnett DK, et al., 2019) is: 16.3%   Values used to calculate the score:     Age: 65 years     Sex: Female     Is Non-Hispanic African American: Yes     Diabetic: Yes     Tobacco smoker: No     Systolic Blood Pressure: 382 mmHg     Is BP treated: No     HDL Cholesterol: 55 mg/dL     Total Cholesterol: 144 mg/dL    Depression screen [facility] 2/9 08/30/2020 05/25/2018 08/18/2017  Decreased Interest 0 0 0  Down, Depressed, Hopeless 0 0 0  PHQ - 2 Score 0 0 0     Social History   Tobacco Use  Smoking Status Former   Packs/day: 0.25   Years: 10.00   Pack years: 2.50   Types: Cigarettes  Smokeless Tobacco Never  Tobacco Comments   quit oct 2018   BP Readings from Last 3 Encounters:  10/02/21 (!) 130/54  09/23/21 (!) 158/76  08/09/21 120/72   Pulse Readings from Last 3 Encounters:  10/02/21 72  09/23/21 83  08/09/21 80   Wt Readings from Last 3 Encounters:   10/02/21 224 lb 3.2 oz (101.7 kg)  09/23/21 222 lb 3.2 oz (100.8 kg)  08/09/21 223 lb 12.8 oz (101.5 kg)   BMI Readings from Last 3 Encounters:  10/02/21 34.09 kg/m  09/23/21 34.80 kg/m  08/09/21 35.05 kg/m    Assessment/Interventions: Review of patient past medical history, allergies, medications, health status, including review of consultants reports, laboratory and other test data, was performed as part of comprehensive evaluation and provision of chronic care management services.   SDOH:  (Social Determinants of Health) assessments and interventions performed: Yes SDOH Interventions    Flowsheet Row Most Recent Value  SDOH Interventions   Transportation  Interventions --  [patient is working on [facility]]      SDOH Screenings   Alcohol Screen: Not on file  Depression (DXA1-2): Not on file  Financial Resource Strain: High Risk   Difficulty of Paying Living Expenses: Hard  Food Insecurity: Not on file  Housing: Not on file  Physical Activity: Not on file  Social Connections: Not on file  Stress: Not on file  Tobacco Use: Medium Risk   Smoking Tobacco Use: Former   Smokeless Tobacco Use: Never   Passive Exposure: Not on file  Transportation Needs: Unmet Transportation Needs   Lack of Transportation (Medical): Yes   Lack of Transportation (Non-Medical): Yes   Patient uses relief on her back, hips and thighs in the morning right away. Patient is taking prednisone right now.  Patient volunteers to help the children read and does this almost every day.   Patient's sister's dog died from prostate cancer. Patient sister lives in [place] and is pretty close to her sister and talks to her every day. Patient's sister's husband just died recently as well. Patient's husband passed in 2020.  Patient is having some pain coming from her neck and down her spine.  Patient is having more stress because her apartment is increasing in cost and she is not able to afford her housing right  now. She is being forced to leave because she cannot afford it.    Patient is afraid to move from where she is. She used to work for [employer].  Patient is having some issues with transportation right now and is not able to get anywhere. She requested papers from our office to have signed so she can have [employer].  CCM Care Plan  No Known Allergies  Medications Reviewed Today     Reviewed by Viona Gilmore, [facility] (Pharmacist) on 10/10/21 at 1653  Med List Status: <None>   Medication Order Taking? Sig Documenting Provider Last Dose Status Informant  0.9 %  sodium chloride infusion [id number]   Irene Shipper, MD  Active   albuterol (ACCUNEB) 0.63 MG/3ML nebulizer solution [id number] Yes Take 1 ampule by nebulization every 6 (six) hours as needed for wheezing. [provider] Taking Active   Aspirin-Salicylamide-Caffeine (BC HEADACHE POWDER PO) [id number] No Take 1 packet by mouth as needed. Reported on 01/09/2016  Patient not taking: Reported on 10/10/2021   [provider] Not Taking Active            Med Note Keene Breath, CHANDRA L   Mon Nov 29, 2012  4:38 PM) Prn   atorvastatin (LIPITOR) 40 MG tablet [id number] Yes Take 1 tablet by mouth once daily [name], MD Taking Active   Blood Glucose Monitoring Suppl (ACCU-CHEK AVIVA PLUS) w/Device KIT [id number]  USE AS DIRECTED TO CHECK BLOOD GLUCOSE 1 TO 2 TIMES DAILY [name], MD  Active   furosemide (LASIX) 40 MG tablet [id number] Yes Take 1 tablet (40 mg total) by mouth daily.  Patient taking differently: Take 40 mg by mouth daily as needed.   [name], MD Taking Active   glucose blood (ACCU-CHEK AVIVA PLUS) test strip [id number]  USE ONE STRIP TO CHECK GLUCOSE ONCE TO TWICE DAILY [name], MD  Active   ibuprofen (ADVIL) 800 MG tablet [id number] No TAKE 1 TABLET BY MOUTH EVERY 8 HOURS AS NEEDED  Patient not taking: Reported on 10/10/2021   [name], MD Not Taking Active    lisinopril-hydrochlorothiazide (ZESTORETIC) 20-12.5 MG tablet  [id number] Yes Take 1 tablet by mouth once daily [name], MD Taking Active   metFORMIN (GLUCOPHAGE) 500 MG tablet [id number] Yes Take 1 tablet (500 mg total) by mouth 2 (two) times daily with a meal. [name], MD Taking Active   methocarbamol (ROBAXIN) 500 MG tablet [id number] No Take 1 tablet (500 mg total) by mouth 2 (two) times daily as needed.  Patient not taking: Reported on 10/10/2021   Aundra Dubin, PA-C Not Taking Active   pantoprazole (PROTONIX) 40 MG tablet [id number] Yes Take 1 tablet (40 mg total) by mouth daily. Collene Gobble, MD Taking Active   predniSONE (STERAPRED UNI-PAK 21 TAB) 10 MG (21) TBPK tablet [id number] Yes Take as directed Nathaniel Man Taking Active   [facility] HFA 108 [phone] Base) MCG/ACT inhaler [id number] Yes Inhale 2 puffs by mouth twice daily [name], MD Taking Active   [facility] ELLIPTA 200-62.5-25 MCG/ACT AEPB [id number] Yes INHALE 1 PUFF INTO LUNGS ONCE DAILY [name], MD Taking Active             Patient Active Problem List   Diagnosis Date Noted   Chronic throat clearing 07/17/2021   Stage 3a chronic kidney disease ([place]) 01/23/2021   Cataract, age-related 04/27/2020   Multiple somatic complaints 01/10/2019   Adenomatous polyp of colon 05/25/2018   History of colonic polyps 03/06/2017   Asthma, chronic, mild persistent, uncomplicated 01/75/1025   Chronic seasonal allergic rhinitis due to pollen 03/06/2017   Gastroesophageal reflux disease without esophagitis 10/10/2014   Ex-cigarette smoker 09/28/2013   Type 2 diabetes mellitus with complications ([place]) 85/27/7824   Obesity (BMI 30-39.9) 07/12/2012   Chronic back pain 07/12/2012   Hypertension associated with diabetes ([place]) 07/12/2012    Immunization History  Administered Date(s) Administered   Hepatitis B 08/13/2012, 09/10/2012   Hepatitis B, adult 09/28/2013   Moderna SARS-COV2 Booster Vaccination  11/08/2020   Moderna Sars-Covid-2 Vaccination 03/08/2020, 04/03/2020   PPD Test 08/08/2014, 09/10/2015, 09/01/2016   Pneumococcal Conjugate-13 03/06/2017   Pneumococcal Polysaccharide-23 08/13/2012   Tdap 08/13/2012   Zoster Recombinat (Shingrix) 01/12/2017, 07/18/2017   Zoster, Live 10/10/2014    Conditions to be addressed/monitored:  Hypertension, Hyperlipidemia, Diabetes, GERD, Asthma, Chronic Kidney Disease, and Allergic Rhinitis  Care Plan : CCM Pharmacy Care Plan  Updates made by Viona Gilmore, [place] since 10/10/2021 12:00 AM     Problem: Problem: Hypertension, Hyperlipidemia, Diabetes, GERD, Asthma, Chronic Kidney Disease, Overactive Bladder, and Allergic Rhinitis      Long-Range Goal: Patient-Specific Goal   Start Date: 10/10/2021  Expected End Date: 10/10/2021  This Visit's Progress: On track  Priority: High  Note:   Current Barriers:  Unable to independently monitor therapeutic efficacy Unable to maintain control of diabetes  Pharmacist Clinical Goal(s):  Patient will achieve adherence to monitoring guidelines and medication adherence to achieve therapeutic efficacy achieve control of diabetes as evidenced by A1c  through collaboration with PharmD and provider.   Interventions: 1:1 collaboration with [name], MD regarding development and update of comprehensive plan of care as evidenced by provider attestation and co-signature Inter-disciplinary care team collaboration (see longitudinal plan of care) Comprehensive medication review performed; medication list updated in electronic medical record  Hypertension (BP goal <140/90) -Uncontrolled -Current treatment: Lisinopril-HCTZ 20-12.5 mg 1 tablet daily -Medications previously tried: none  -Current home readings: does not check at home (owns a wrist cuff but has not calibrated it with doctor's office) -Current dietary habits: doesn't cook with salt at  all except uses some on grits; was eating a lot of  bacon but cut back -Current exercise habits: unable to with shortness of breath -Denies hypotensive/hypertensive symptoms -Educated on BP goals and benefits of medications for prevention of heart attack, stroke and kidney damage; Importance of home blood pressure monitoring; Proper BP monitoring technique; -Counseled to monitor BP at home weekly, document, and provide log at future appointments -Counseled on diet and exercise extensively Recommended to continue current medication  Hyperlipidemia: (LDL goal < 70) -Not ideally controlled -Current treatment: Atorvastatin 40 mg 1 tablet daily -Medications previously tried: none  -Current dietary patterns: not eating much red meat -Current exercise habits: not able to exercise -Educated on Cholesterol goals;  Benefits of statin for ASCVD risk reduction; -Counseled on diet and exercise extensively Recommended to continue current medication  Diabetes (A1c goal <7%) -Uncontrolled -Current medications: Metformin 500 mg 1 tablet twice daily - doesn't always take it twice daily -Medications previously tried: none  -Current home glucose readings fasting glucose: 120-130; 97 (checking once a week) post prandial glucose: n/a -Denies hypoglycemic/hyperglycemic symptoms -Current meal patterns: eating out once or twice a month breakfast: honey nut cheerios   lunch: lunch at the school  dinner: smoothie or heats up something (air fryer); chicken tenders, fish and shrimp and vegetables (spinach, broccoli, string beans) snacks: n/a drinks: orange juice with breakfast -Current exercise: not able to -Educated on A1c and blood sugar goals; Benefits of routine self-monitoring of blood sugar; Carbohydrate counting and/or plate method -Counseled to check feet daily and get yearly eye exams -Counseled on diet and exercise extensively Recommended switching to SGLT2 for kidney benefits.  Asthma (Goal: control symptoms) -Uncontrolled -Current  treatment  Trelegy ellipta 200-62.5-25 mcg 1 puff daily Proair HFA 2 puffs twice daily Albuterol nebulizer every 6 hours as needed -Medications previously tried: n/a  -Pulmonary function testing: 10/02/21 -Patient reports consistent use of maintenance inhaler -Frequency of rescue inhaler use: up to 4 times a day -Counseled on Proper inhaler technique; Differences between maintenance and rescue inhalers -Counseled on diet and exercise extensively Recommended to continue current medication  GERD (Goal: minimize symptoms of heart burn) -Controlled -Current treatment  Pantoprazole 40 mg 1 tablet daily -Medications previously tried: none  -Recommended to continue current medication  Pain (Goal: minimize pain) -Not ideally controlled -Current treatment  Methocarbamol 500 mg 1 tablet twice daily as needed Tramadol 50 mg 1-2 tablets three times daily as needed Ibuprofen 800 mg 1 tablet every 8 hours as needed -Medications previously tried: none  -Counseled on limiting use of NSAIDs due to risk for kidney damage and relying on Tylenol.  Swelling (Goal: minimize swelling) -Controlled -Current treatment  Furosemide 40 mg 1 tablet daily as needed -Medications previously tried: none  -Counseled on diet and exercise extensively Recommended to continue current medication  Health Maintenance -Vaccine gaps: COVID booster, Prevnar 20 -Current therapy:  BC headache powder (rarely uses) Multivitamin 1 tablet daily - not taking right now -Educated on Cost vs benefit of each product must be carefully weighed by individual consumer -Patient is satisfied with current therapy and denies issues -Recommended to continue current medication  Patient Goals/Self-Care Activities Patient will:  - take medications as prescribed as evidenced by patient report and record review check glucose weekly, document, and provide at future appointments check blood pressure weekly, document, and provide at future  appointments  Follow Up Plan: The care management team will reach out to the patient again over the next 30 days.  Medication Assistance: None required.  Patient affirms current coverage meets needs.  Compliance/Adherence/Medication fill history: Care Gaps: Prevnar 20, DEXA, COVID booster, colonoscopy, A1c Last PCP BP: 130/54 P: 72 on 10/02/21 Last A1C: 7.8%  Star-Rating Drugs: Atorvastatin 14m - [facility] [place] [facility] [place]- [facility] [place] [facility] [facility] [place] [facility]  [place] [address] [place] [id number]: [phone]: [phone] pill box? No - does not need Pt endorses 80% compliance - skips metformin sometimes  We discussed: Benefits of medication synchronization, packaging and delivery as well as enhanced pharmacist oversight with [employer]. Patient decided to: Continue current medication management strategy  Care Plan and Follow Up Patient Decision:  Patient agrees to Care Plan and Follow-up.  Plan: The care management team will reach out to the patient again over the next 30 days.  MaJeni SallesPharmD, BCMoh[facility]amily Medicine [phone]

## 2021-10-14 ENCOUNTER — Telehealth: Payer: Self-pay | Admitting: Orthopaedic Surgery

## 2021-10-14 ENCOUNTER — Telehealth: Payer: Self-pay

## 2021-10-14 ENCOUNTER — Telehealth: Payer: Self-pay | Admitting: *Deleted

## 2021-10-14 NOTE — Telephone Encounter (Signed)
Sent pt  a my chart message to call and schedule a diabetes check and possible new med. [name]

## 2021-10-14 NOTE — Chronic Care Management (AMB) (Signed)
  Care Management   Note  10/14/2021 Name: Ashelynn Marks MRN: [id number] DOB: [birth year]  [name] Magan Winnett is a 65 y.o. year old female who is a primary care patient of [name], MD and is actively engaged with the care management team. I reached out to Marissa Nestle by phone today to assist with scheduling an initial visit with the BSW  Follow up plan: Unsuccessful telephone outreach attempt made. A HIPAA compliant phone message was left for the patient providing contact information and requesting a return call. The care management team will reach out to the patient again over the next 7 days.  If patient returns call to provider office, please advise to call [place] at [phone].  [place] Management  Direct Dial: [phone]

## 2021-10-14 NOTE — Telephone Encounter (Signed)
-----   Message from [name], MD sent at 10/11/2021  6:42 AM EST ----- Regarding: FW: SGLT2 therapy Needs a diabetes recheck ----- Message ----- From: Viona Gilmore, [facility] Sent: 10/10/2021   5:28 PM EST To: [name], MD Subject: SGLT2 therapy                                  Hi,  What do you think about switching [name]'s metformin to an SGLT2 based on her CKD? I think she would benefit from one and cost shouldn't be an issue because she has medicare & medicaid. If you want me to look at her formulary to see if either [place] or Vania Rea is preferred I can, just let me know!  Also, her DEXA scan order expired and she never scheduled it or it got cancelled according to her. Can you reorder this for her? I will let her know when the order is in there.  Thank you! Maddie

## 2021-10-14 NOTE — Chronic Care Management (AMB) (Signed)
  Care Management   Note  10/14/2021 Name: [name] MRN: [id number] DOB: Jan 17, 1956  [name] is a 65 y.o. year old female who is a primary care patient of [name], MD and is actively engaged with the care management team. I reached out to Marissa Nestle by phone today to assist with scheduling an initial visit with the BSW  Follow up plan: Telephone appointment with care management team member scheduled for:10/22/21  [place] Management  Direct Dial: [phone]

## 2021-10-14 NOTE — Telephone Encounter (Signed)
Pt called and requested a refill of prednisone. She states that it is working really well for her. Please send to pharmacy on file. Pt asked for a call when med has been sent in. Pt phone number is [phone]

## 2021-10-15 ENCOUNTER — Other Ambulatory Visit: Payer: Self-pay | Admitting: Physician Assistant

## 2021-10-15 MED ORDER — PREDNISONE 10 MG (21) PO TBPK: ORAL_TABLET | ORAL | 0 refills | Status: DC

## 2021-10-15 NOTE — Telephone Encounter (Signed)
I sent in one refill.  Cannot refill this again as not good for the body to take too frequently.  If still in pain and needs this med, I recommend referral to [facility] for esi and f/u with dr. Louanne Skye or yates.

## 2021-10-18 NOTE — Telephone Encounter (Signed)
Called patient no answer.

## 2021-10-22 ENCOUNTER — Telehealth: Payer: Self-pay

## 2021-10-22 ENCOUNTER — Telehealth: Payer: Medicare Other

## 2021-10-22 NOTE — Telephone Encounter (Signed)
  Care Management   Follow Up Note   10/22/2021 Name: [name] MRN: [id number] DOB: 1956-08-17   Referred by: [name], MD Reason for referral : Chronic Care Management (Unsuccessful call)   An unsuccessful telephone outreach was attempted today. The patient was referred to the case management team for assistance with care management and care coordination. SW left a HIPAA compliant voice message requesting a return call.  Follow Up Plan: The care management team will reach out to the patient again over the next 30 days.   [name], BSW, CDP Social Worker, Certified Dementia Practitioner [place] [facility] [place] Management [phone]

## 2021-10-29 ENCOUNTER — Telehealth: Payer: Self-pay | Admitting: Orthopaedic Surgery

## 2021-10-29 NOTE — Telephone Encounter (Signed)
Pt called requesting a call back from [place]. Pt states [employer] transportation faxed over forms to be filled out for pt medical condition that needs to be filled out right away and faxed back fro pt's transportation to continue. Please call pt about this matter and fax forms back as soon as possible. Pt phone number is [phone]

## 2021-10-30 DIAGNOSIS — E118 Type 2 diabetes mellitus with unspecified complications: Secondary | ICD-10-CM

## 2021-10-30 DIAGNOSIS — N1831 Chronic kidney disease, stage 3a: Secondary | ICD-10-CM

## 2021-10-30 NOTE — Telephone Encounter (Signed)
Received forms today. Will ask Mendel Ryder if she approves. Looks like another MD prev filed out form.

## 2021-10-31 ENCOUNTER — Ambulatory Visit: Payer: Medicare Other | Admitting: Family Medicine

## 2021-10-31 NOTE — Telephone Encounter (Signed)
Mendel Ryder filled out forms. Faxed to [employer]. Called patient no answer LMOM.

## 2021-11-02 ENCOUNTER — Other Ambulatory Visit: Payer: Self-pay | Admitting: Family Medicine

## 2021-11-02 DIAGNOSIS — J4530 Mild persistent asthma, uncomplicated: Secondary | ICD-10-CM

## 2021-11-03 ENCOUNTER — Other Ambulatory Visit: Payer: Self-pay | Admitting: Family Medicine

## 2021-11-03 DIAGNOSIS — E118 Type 2 diabetes mellitus with unspecified complications: Secondary | ICD-10-CM

## 2021-11-04 ENCOUNTER — Telehealth: Payer: Self-pay | Admitting: *Deleted

## 2021-11-04 NOTE — Telephone Encounter (Signed)
[facility] is requesting to fill pt albuterol inhaler. Please advise [facility] 

## 2021-11-04 NOTE — Chronic Care Management (AMB) (Signed)
  Care Management   Note  11/04/2021 Name: [name] MRN: [id number] DOB: Nov 14, 1956  [name] Briceida Rasberry is a 65 y.o. year old female who is a primary care patient of [name], MD and is actively engaged with the care management team. I reached out to Marissa Nestle by phone today to assist with re-scheduling an initial visit with the BSW  Follow up plan: Unsuccessful telephone outreach attempt made. A HIPAA compliant phone message was left for the patient providing contact information and requesting a return call.  The care management team will reach out to the patient again over the next 7 days.  If patient returns call to provider office, please advise to call [place] at [phone].  [place] Management  Direct Dial: [phone]

## 2021-11-04 NOTE — Telephone Encounter (Signed)
[facility] is requesting to fil pt trelegy . Please advise [facility]

## 2021-11-05 ENCOUNTER — Telehealth: Payer: Self-pay

## 2021-11-05 DIAGNOSIS — R609 Edema, unspecified: Secondary | ICD-10-CM

## 2021-11-05 MED ORDER — FUROSEMIDE 40 MG PO TABS: 40.0000 mg | ORAL_TABLET | Freq: Every day | ORAL | 0 refills | Status: DC

## 2021-11-05 NOTE — Telephone Encounter (Signed)
[facility] fax for refill request

## 2021-11-07 ENCOUNTER — Other Ambulatory Visit: Payer: Self-pay

## 2021-11-07 ENCOUNTER — Ambulatory Visit (INDEPENDENT_AMBULATORY_CARE_PROVIDER_SITE_OTHER): Payer: Medicare Other | Admitting: Family Medicine

## 2021-11-07 ENCOUNTER — Encounter: Payer: Self-pay | Admitting: Family Medicine

## 2021-11-07 VITALS — BP 130/78 | HR 78 | Temp 98.3°F | Wt 218.6 lb

## 2021-11-07 DIAGNOSIS — Z23 Encounter for immunization: Secondary | ICD-10-CM | POA: Diagnosis not present

## 2021-11-07 DIAGNOSIS — E118 Type 2 diabetes mellitus with unspecified complications: Secondary | ICD-10-CM | POA: Diagnosis not present

## 2021-11-07 DIAGNOSIS — J4530 Mild persistent asthma, uncomplicated: Secondary | ICD-10-CM

## 2021-11-07 DIAGNOSIS — E669 Obesity, unspecified: Secondary | ICD-10-CM | POA: Diagnosis not present

## 2021-11-07 DIAGNOSIS — J301 Allergic rhinitis due to pollen: Secondary | ICD-10-CM | POA: Diagnosis not present

## 2021-11-07 LAB — POCT GLYCOSYLATED HEMOGLOBIN (HGB A1C): Hemoglobin A1C: 7.2 % — AB (ref 4.0–5.6)

## 2021-11-07 MED ORDER — MONTELUKAST SODIUM 10 MG PO TABS: 10.0000 mg | ORAL_TABLET | Freq: Every day | ORAL | 3 refills | Status: DC

## 2021-11-07 NOTE — Patient Instructions (Addendum)
Keep on the Trelegy use the albuterol as needed but if you need it more than twice a week let me know.  Check with your insurance to see which albuterol they cover the best.  Get on the new Singulair and continue on that and see how that does to help with her asthma.  Keep taking all your other medications.

## 2021-11-07 NOTE — Progress Notes (Signed)
  Subjective:    Patient ID: [name], female    DOB: 1956/07/08, 65 y.o.   MRN: [id number]  [name] is a 65 y.o. female who presents for follow-up of Type 2 diabetes mellitus.  Home blood sugar records:  post meal lowest reading 97 highest 180 Current symptoms/problems include none and have been stable. Daily foot checks: yes  Any foot concerns: none Exercise: The patient does not participate in regular exercise at present. Diet: good She continues on metformin and is having no difficulty with that.  Is also taking Zestoretic without cough or other problems.  She is also using Lasix on an as-needed basis.  Continues on Protonix for her reflux without trouble.  She is using Trelegy but is using her rescue inhaler fairly regularly. The following portions of the patient's history were reviewed and updated as appropriate: allergies, current medications, past medical history, past social history and problem list.  ROS as in subjective above.     Objective:    Physical Exam Alert and in no distress cardiac exam shows regular rhythm without murmurs gallops.  Lungs are clear to auscultation.  Hemoglobin A1c is 7.2.   Lab Review Diabetic Labs Latest Ref Rng & Units 03/27/2021 01/31/2021 01/22/2021 08/30/2020 02/27/2020  HbA1c 4.0 - 5.6 % 7.8(A) 7.8(A) - 7.3(A) 6.7(A)  Microalbumin mg/L - - - - 12.0  Micro/Creat Ratio - - - - - 8.3  Chol 100 - 199 mg/dL - - - 144 183  HDL >39 mg/dL - - - 55 49  Calc LDL 0 - 99 mg/dL - - - 71 104(H)  Triglycerides 0 - 149 mg/dL - - - 100 171(H)  Creatinine 0.57 - 1.00 mg/dL - - 1.27(H) 1.33(H) 1.33(H)   BP/Weight 10/02/2021 09/23/2021 08/09/2021 07/17/2021 4/48/1856  Systolic BP 314 970 263 785 885  Diastolic BP 54 76 72 82 72  Wt. (Lbs) 224.2 222.2 223.8 224.4 231.4  BMI 34.09 34.8 35.05 35.15 38.51   Foot/eye exam completion dates Latest Ref Rng & Units 05/16/2021 03/27/2021  Eye Exam No Retinopathy No Retinopathy -  Foot Form  Completion - - Done    Shandrell  reports that she has quit smoking. Her smoking use included cigarettes. She has a 2.50 pack-year smoking history. She has never used smokeless tobacco. She reports current alcohol use. She reports current drug use. Drug: Marijuana.     Assessment & Plan:    Type 2 diabetes mellitus with complications ([place]) - Plan: POCT glycosylated hemoglobin (Hb A1C)  Obesity (BMI 30-39.9)  Chronic seasonal allergic rhinitis due to pollen  Asthma, chronic, mild persistent, uncomplicated - Plan: montelukast (SINGULAIR) 10 MG tablet  Immunization, viral disease - Plan: Moderna Covid-19 Vaccine Bivalent Booster  Rx changes:  Singulair added. Education: Reviewed 'ABCs' of diabetes management (respective goals in parentheses):  A1C (<7), blood pressure (<130/80), and cholesterol (LDL <100). Compliance at present is estimated to be good. Efforts to improve compliance (if necessary) will be directed at increased exercise. Follow up: 4 months I will add Singulair to her regimen.  Stressed to her multiple times that her asthma is not under good control with the present regimen and I will add Singulair to see if this will help.  Explained that she needs to use her rescue inhaler only twice per week and she has been using it as much as twice per day.  I think she probably recognizes the need for better control.

## 2021-11-10 ENCOUNTER — Other Ambulatory Visit: Payer: Self-pay | Admitting: Family Medicine

## 2021-11-10 DIAGNOSIS — E1159 Type 2 diabetes mellitus with other circulatory complications: Secondary | ICD-10-CM

## 2021-11-11 NOTE — Chronic Care Management (AMB) (Signed)
  Care Management   Note  11/11/2021 Name: [name] MRN: [id number] DOB: Aug 04, 1956  [name] [name] is a 65 y.o. year old female who is a primary care patient of [name], MD and is actively engaged with the care management team. I reached out to Marissa Nestle by phone today to assist with re-scheduling an initial visit with the BSW  Follow up plan: Unsuccessful telephone outreach attempt made. A HIPAA compliant phone message was left for the patient providing contact information and requesting a return call.  The care management team will reach out to the patient again over the next 7 days.  If patient returns call to provider office, please advise to call [place] at [phone].  [place] Management  Direct Dial: [phone]

## 2021-11-12 NOTE — Chronic Care Management (AMB) (Signed)
°  Care Management   Note  11/12/2021 Name: [name] MRN: [id number] DOB: [birth year]  [name] is a 65 y.o. year old female who is a primary care patient of [name], MD and is actively engaged with the care management team. I reached out to Marissa Nestle by phone today to assist with re-scheduling an initial visit with the BSW  Follow up plan: Telephone appointment with care management team member scheduled for:11/15/21  [facility] Management  Direct Dial: [phone]

## 2021-11-15 ENCOUNTER — Ambulatory Visit (INDEPENDENT_AMBULATORY_CARE_PROVIDER_SITE_OTHER): Payer: Medicare Other

## 2021-11-15 DIAGNOSIS — E1159 Type 2 diabetes mellitus with other circulatory complications: Secondary | ICD-10-CM

## 2021-11-15 DIAGNOSIS — E118 Type 2 diabetes mellitus with unspecified complications: Secondary | ICD-10-CM

## 2021-11-15 NOTE — Patient Instructions (Signed)
Social Worker Visit Information  Goals we discussed today:  Patient Goals/Self-Care Activities patient will:   - Engage with [employer] to identify alternative housing options to accept section 8 voucher    Materials Provided: No: Patient declined  Patient verbalizes understanding of instructions provided today and agrees to view in [place].   Follow Up Plan:  No follow up planned at this time. Please contact me as needed.  [name], BSW, CDP Social Worker, Certified Dementia Practitioner [facility] [phone]

## 2021-11-15 NOTE — Chronic Care Management (AMB) (Signed)
Chronic Care Management    Social Work Note  11/15/2021 Name: [name] MRN: [id number] DOB: Dec 25, 1955  [name] Spiller is a 65 y.o. year old female who is a primary care patient of [employer], Elyse Jarvis, MD. The CCM team was consulted to assist the patient with chronic disease management and/or care coordination needs related to:  HTN, DM II, Housing Barriers .   Engaged with patient by telephone for follow up visit in response to provider referral for social work chronic care management and care coordination services.   Consent to Services:  The patient was given information about Chronic Care Management services, agreed to services, and gave verbal consent prior to initiation of services.  Please see initial visit note for detailed documentation.   Patient agreed to services and consent obtained.   Assessment: Review of patient past medical history, allergies, medications, and health status, including review of relevant consultants reports was performed today as part of a comprehensive evaluation and provision of chronic care management and care coordination services.     SDOH (Social Determinants of Health) assessments and interventions performed:  SDOH Interventions    Flowsheet Row Most Recent Value  SDOH Interventions   Housing Interventions Other (Comment)  [patients apartment manager has advised they will no longer accept section 8]        Advanced Directives Status: Not addressed in this encounter.  CCM Care Plan  No Known Allergies  Outpatient Encounter Medications as of 11/15/2021  Medication Sig Note   albuterol (ACCUNEB) 0.63 MG/3ML nebulizer solution Take 1 ampule by nebulization every 6 (six) hours as needed for wheezing.    Aspirin-Salicylamide-Caffeine (BC HEADACHE POWDER PO) Take 1 packet by mouth as needed. Reported on 01/09/2016 11/29/2012: Prn    atorvastatin (LIPITOR) 40 MG tablet Take 1 tablet by mouth once daily    Blood Glucose Monitoring  Suppl (ACCU-CHEK AVIVA PLUS) w/Device KIT USE AS DIRECTED TO CHECK BLOOD GLUCOSE 1 TO 2 TIMES DAILY    furosemide (LASIX) 40 MG tablet Take 1 tablet (40 mg total) by mouth daily.    glucose blood (ACCU-CHEK AVIVA PLUS) test strip USE ONE STRIP TO CHECK GLUCOSE ONCE TO TWICE DAILY    ibuprofen (ADVIL) 800 MG tablet TAKE 1 TABLET BY MOUTH EVERY 8 HOURS AS NEEDED    lisinopril-hydrochlorothiazide (ZESTORETIC) 20-12.5 MG tablet Take 1 tablet by mouth once daily    metFORMIN (GLUCOPHAGE) 500 MG tablet TAKE 1 TABLET BY MOUTH TWICE DAILY WITH A MEAL    methocarbamol (ROBAXIN) 500 MG tablet Take 1 tablet (500 mg total) by mouth 2 (two) times daily as needed.    montelukast (SINGULAIR) 10 MG tablet Take 1 tablet (10 mg total) by mouth at bedtime.    pantoprazole (PROTONIX) 40 MG tablet Take 1 tablet (40 mg total) by mouth daily.    predniSONE (STERAPRED UNI-PAK 21 TAB) 10 MG (21) TBPK tablet Take as directed (Patient not taking: Reported on 11/07/2021)    PROAIR HFA 108 (90 Base) MCG/ACT inhaler Inhale 2 puffs by mouth twice daily    TRELEGY ELLIPTA 200-62.5-25 MCG/ACT AEPB INHALE 1 PUFF INTO LUNGS ONCE DAILY    Facility-Administered Encounter Medications as of 11/15/2021  Medication   0.9 %  sodium chloride infusion    Patient Active Problem List   Diagnosis Date Noted   Chronic throat clearing 07/17/2021   Stage 3a chronic kidney disease ([place]) 01/23/2021   Cataract, age-related 04/27/2020   Multiple somatic complaints 01/10/2019   Adenomatous  polyp of colon 05/25/2018   History of colonic polyps 03/06/2017   Asthma, chronic, mild persistent, uncomplicated 32/67/1245   Chronic seasonal allergic rhinitis due to pollen 03/06/2017   Gastroesophageal reflux disease without esophagitis 10/10/2014   Ex-cigarette smoker 09/28/2013   Type 2 diabetes mellitus with complications ([place]) 80/99/8338   Obesity (BMI 30-39.9) 07/12/2012   Chronic back pain 07/12/2012   Hypertension associated with diabetes  ([place]) 07/12/2012    Conditions to be addressed/monitored: HTN and DMII; Housing barriers  Care Plan : General Social Work (Adult)  Updates made by [name] since 11/15/2021 12:00 AM  Completed 11/15/2021   Problem: Housing Resolved 11/15/2021     Goal: Housing Resources Identified Completed 11/15/2021  Start Date: 11/15/2021  Priority: High  Note:   Current Barriers:  Chronic disease management support and education needs related to HTN and DM  Housing barriers  Social Worker Clinical Goal(s):  patient will work with SW to identify and address any acute and/or chronic care coordination needs related to the self health management of HTN and DM  explore community resource options for unmet needs related to:  Housing  SW Interventions:  Inter-disciplinary care team collaboration (see longitudinal plan of care) Collaboration with [name], MD regarding development and update of comprehensive plan of care as evidenced by provider attestation and co-signature Communication received from PharmD indicating patient is in need of housing resources Telephonic visit completed with the patient to determine the apartment she has lived in for 15 years is under new ownership and she has until February to move out due to the decision they will no longer accept Section 8 vouchers Discussed the patient was advised of this decision last February but she was hopeful they would change her mind due to her being a good tenant Patient has been in contact with Section 8 caseworker and was advised they would raise her voucher amount to $900 for 1 bedroom apartment in hopes she could locate an alternative option Discussed patient has obtained a list of low income senior housing options from [employer] and is in process of contacting them to determine if an apartment is Psychologist, occupational with SW colleagues to discuss patient needs and attempt to locate resources  -  unfortunately  none located at this time Advised the patient she could contact [employer] to request assistance under their homelessness prevention program - patient reports she has contacted them and obtained resources Reviewed patient has a Chief Executive Officer and plans to see if she has any legal grounds to stay in her home Determined patient also has been approved for section 8 in [place] where she is from and was advised she would receive a voucher for $1900/month for housing - patient unsure she would like to move back to [place] but may if she does not locate an option in [place] the patient that unfortunately this SW does not have other resources for housing as she has already been in touch with available resources in this area Provided patient with SW number and encouraged her to contact me as needed  Patient Goals/Self-Care Activities patient will:   -  Engage with [employer] to identify alternative housing options to accept section 8 voucher       Follow Up Plan:  No SW follow up planned at this time. Patient is encouraged to contact SW as needed.      [name], BSW, CDP Social Worker, Certified Dementia Practitioner [facility]  [phone] ° °   ° ° ° ° °

## 2021-11-21 DIAGNOSIS — J45998 Other asthma: Secondary | ICD-10-CM | POA: Diagnosis not present

## 2021-11-26 ENCOUNTER — Other Ambulatory Visit: Payer: Self-pay

## 2021-11-26 DIAGNOSIS — R609 Edema, unspecified: Secondary | ICD-10-CM

## 2021-11-26 MED ORDER — FUROSEMIDE 40 MG PO TABS: 40.0000 mg | ORAL_TABLET | Freq: Every day | ORAL | 0 refills | Status: DC

## 2021-11-28 ENCOUNTER — Other Ambulatory Visit: Payer: Self-pay | Admitting: Family Medicine

## 2021-11-28 NOTE — Telephone Encounter (Signed)
REFILL request last apt 11/07/21 next apt 03/10/22.

## 2021-11-30 DIAGNOSIS — E118 Type 2 diabetes mellitus with unspecified complications: Secondary | ICD-10-CM

## 2021-11-30 DIAGNOSIS — I152 Hypertension secondary to endocrine disorders: Secondary | ICD-10-CM

## 2021-11-30 DIAGNOSIS — E1159 Type 2 diabetes mellitus with other circulatory complications: Secondary | ICD-10-CM

## 2021-12-24 ENCOUNTER — Telehealth: Payer: Self-pay | Admitting: Pharmacist

## 2021-12-24 NOTE — Chronic Care Management (AMB) (Signed)
Chronic Care Management Pharmacy Assistant   Name: [name]  MRN: [id number] DOB: 09-24-1956  Reason for Encounter: Disease State    Conditions to be addressed/monitored: DMII/ BP  Recent office visits:  11/15/21 [name] - Patient presented for CCM social work visit.  11/07/21 Lisa Lung, MD - Patient presented for Type 2 diabetes mellitus with complications and other concerns. Prescribed Montelukast Sodium.  Recent consult visits:  None   Hospital visits:  None in previous 6 months  Medications: Outpatient Encounter Medications as of 12/24/2021  Medication Sig Note   TRELEGY ELLIPTA 200-62.5-25 MCG/ACT AEPB INHALE 1 PUFF ONCE DAILY    albuterol (ACCUNEB) 0.63 MG/3ML nebulizer solution Take 1 ampule by nebulization every 6 (six) hours as needed for wheezing.    Aspirin-Salicylamide-Caffeine (BC HEADACHE POWDER PO) Take 1 packet by mouth as needed. Reported on 01/09/2016 11/29/2012: Prn    atorvastatin (LIPITOR) 40 MG tablet Take 1 tablet by mouth once daily    Blood Glucose Monitoring Suppl (ACCU-CHEK AVIVA PLUS) w/Device KIT USE AS DIRECTED TO CHECK BLOOD GLUCOSE 1 TO 2 TIMES DAILY    furosemide (LASIX) 40 MG tablet Take 1 tablet (40 mg total) by mouth daily.    glucose blood (ACCU-CHEK AVIVA PLUS) test strip USE ONE STRIP TO CHECK GLUCOSE ONCE TO TWICE DAILY    ibuprofen (ADVIL) 800 MG tablet TAKE 1 TABLET BY MOUTH EVERY 8 HOURS AS NEEDED    lisinopril-hydrochlorothiazide (ZESTORETIC) 20-12.5 MG tablet Take 1 tablet by mouth once daily    metFORMIN (GLUCOPHAGE) 500 MG tablet TAKE 1 TABLET BY MOUTH TWICE DAILY WITH A MEAL    methocarbamol (ROBAXIN) 500 MG tablet Take 1 tablet (500 mg total) by mouth 2 (two) times daily as needed.    montelukast (SINGULAIR) 10 MG tablet Take 1 tablet (10 mg total) by mouth at bedtime.    pantoprazole (PROTONIX) 40 MG tablet Take 1 tablet (40 mg total) by mouth daily.    predniSONE (STERAPRED UNI-PAK 21 TAB) 10 MG (21)  TBPK tablet Take as directed (Patient not taking: Reported on 11/07/2021)    PROAIR HFA 108 (90 Base) MCG/ACT inhaler Inhale 2 puffs by mouth twice daily    Facility-Administered Encounter Medications as of 12/24/2021  Medication   0.9 %  sodium chloride infusion   Recent Relevant Labs: Lab Results  Component Value Date/Time   HGBA1C 7.2 (A) 11/07/2021 03:37 PM   HGBA1C 7.8 (A) 03/27/2021 03:37 PM   HGBA1C 5.4 06/09/2019 12:00 AM   HGBA1C 7.7 05/12/2019 12:00 AM   MICROALBUR 12.0 02/27/2020 04:27 PM   MICROALBUR 15.5 03/01/2015 10:24 AM    Kidney Function Lab Results  Component Value Date/Time   CREATININE 1.27 (H) 01/22/2021 01:50 PM   CREATININE 1.33 (H) 08/30/2020 10:51 AM   CREATININE 1.11 (H) 03/06/2017 03:48 PM   CREATININE 0.99 10/16/2015 12:01 AM   GFRNONAA 44 (L) 01/22/2021 01:50 PM   GFRAA 51 (L) 01/22/2021 01:50 PM    Current antihyperglycemic regimen:  Metformin 500 mg 1 tablet twice daily - doesn't always take it twice daily What recent interventions/DTPs have been made to improve glycemic control:  Patient reports no changes Have there been any recent hospitalizations or ED visits since last visit with CPP? No Patient denies hypoglycemic symptoms, including None Patient denies hyperglycemic symptoms, including none How often are you checking your blood sugar? before lunch What are your blood sugars ranging?  120; 136 During the week, how often does your blood  glucose drop below 70? Never  Adherence Review: Is the patient currently on a STATIN medication? Yes Is the patient currently on ACE/ARB medication? Yes Does the patient have >5 day gap between last estimated fill dates? No  Reviewed chart prior to disease state call. Spoke with patient regarding BP  Recent Office Vitals: BP Readings from Last 3 Encounters:  11/07/21 130/78  10/02/21 (!) 130/54  09/23/21 (!) 158/76   Pulse Readings from Last 3 Encounters:  11/07/21 78  10/02/21 72  09/23/21 83     Wt Readings from Last 3 Encounters:  11/07/21 218 lb 9.6 oz (99.2 kg)  10/02/21 224 lb 3.2 oz (101.7 kg)  09/23/21 222 lb 3.2 oz (100.8 kg)     Kidney Function Lab Results  Component Value Date/Time   CREATININE 1.27 (H) 01/22/2021 01:50 PM   CREATININE 1.33 (H) 08/30/2020 10:51 AM   CREATININE 1.11 (H) 03/06/2017 03:48 PM   CREATININE 0.99 10/16/2015 12:01 AM   GFRNONAA 44 (L) 01/22/2021 01:50 PM   GFRAA 51 (L) 01/22/2021 01:50 PM    BMP Latest Ref Rng & Units 01/22/2021 08/30/2020 02/27/2020  Glucose 65 - 99 mg/dL 172(H) 144(H) 92  BUN 8 - 27 mg/dL 24 29(H) 33(H)  Creatinine 0.57 - 1.00 mg/dL 1.27(H) 1.33(H) 1.33(H)  BUN/Creat Ratio 12 - 28 19 22 25   Sodium 134 - 144 mmol/L 145(H) 139 141  Potassium 3.5 - 5.2 mmol/L 4.2 5.1 5.0  Chloride 96 - 106 mmol/L 104 104 103  CO2 20 - 29 mmol/L 22 22 24   Calcium 8.7 - 10.3 mg/dL 9.5 9.5 10.0    Current antihypertensive regimen:  Lisinopril-HCTZ 20-12.5 mg 1 tablet daily How often are you checking your Blood Pressure? infrequently Current home BP readings: 129/62 What recent interventions/DTPs have been made by any provider to improve Blood Pressure control since last CPP Visit: Patient reports no changes Any recent hospitalizations or ED visits since last visit with CPP? No Notes: Patient reports that she has been under some stress recently, she reports that she is facing some housing insecurity issues. She reports that she is ok with Social Work reaching out to her and asked that they try her at home before her cell  Adherence Review: Is the patient currently on ACE/ARB medication? Yes Does the patient have >5 day gap between last estimated fill dates? No     Care Gaps: PNA Vaccine - Overdue DEXA - Overdue Colonoscopy - Overdue BP- 130/78 ( 11/07/21) AWV- 9/23 CCM- 5/23 Lab Results  Component Value Date   HGBA1C 7.2 (A) 11/07/2021    Star Rating Drugs: Atorvastatin (Lipitor) 40 mg - Last filled 09/23/21 90 DS at  [facility]  Lisinopril - HCTZ 20-12.5 mg - Last filled 11/11/21 90 DS at [facility] Metformin (Glucophage) 500 mg - Last filled 11/04/21 90 DS at [facility] Pharmacist Assistant [phone]

## 2021-12-25 ENCOUNTER — Other Ambulatory Visit: Payer: Self-pay | Admitting: Family Medicine

## 2022-01-02 ENCOUNTER — Telehealth: Payer: Self-pay | Admitting: *Deleted

## 2022-01-02 NOTE — Chronic Care Management (AMB) (Signed)
°  Care Management   Note  01/02/2022 Name: [name] MRN: [id number] DOB: 05-Aug-1956  [name] is a 66 y.o. year old female who is a primary care patient of [name], MD and is actively engaged with the care management team. I reached out to [name] by phone today to assist with re-scheduling a follow up visit with the BSW  Follow up plan: Unsuccessful telephone outreach attempt made. A HIPAA compliant phone message was left for the patient providing contact information and requesting a return call.  The care management team will reach out to the patient again over the next 7 days.  If patient returns call to provider office, please advise to call [place] at [phone].  [facility] Management  Direct Dial: [phone]

## 2022-01-03 ENCOUNTER — Other Ambulatory Visit: Payer: Self-pay | Admitting: Family Medicine

## 2022-01-07 NOTE — Chronic Care Management (AMB) (Signed)
°  Care Management   Note  01/07/2022 Name: [name] MRN: [id number] DOB: 11-30-1956  [name] is a 66 y.o. year old female who is a primary care patient of [name], MD and is actively engaged with the care management team. I reached out to Marissa Nestle by phone today to assist with re-scheduling a follow up visit with the BSW  Follow up plan: Unsuccessful telephone outreach attempt made. A HIPAA compliant phone message was left for the patient providing contact information and requesting a return call.  The care management team will reach out to the patient again over the next 7 days.  If patient returns call to provider office, please advise to call [facility] at [phone].  [facility] Management  Direct Dial: [phone]

## 2022-01-14 NOTE — Chronic Care Management (AMB) (Signed)
°  Care Management   Note  01/14/2022 Name: Yailine Ballard MRN: [id number] DOB: 1956/09/26  [name] Latronda Spink is a 66 y.o. year old female who is a primary care patient of [name], MD and is actively engaged with the care management team. I reached out to Marissa Nestle by phone today to assist with re-scheduling a follow up visit with the BSW  Follow up plan: Telephone appointment with care management team member scheduled for:01/20/22  [facility]  Direct Dial: [phone]

## 2022-01-19 ENCOUNTER — Ambulatory Visit (HOSPITAL_COMMUNITY)
Admission: EM | Admit: 2022-01-19 | Discharge: 2022-01-19 | Disposition: A | Discharge status: Home / Self Care | Payer: Medicare Other | Attending: Physician Assistant | Admitting: Physician Assistant

## 2022-01-19 ENCOUNTER — Encounter (HOSPITAL_COMMUNITY): Payer: Self-pay

## 2022-01-19 ENCOUNTER — Other Ambulatory Visit: Payer: Self-pay

## 2022-01-19 DIAGNOSIS — R112 Nausea with vomiting, unspecified: Secondary | ICD-10-CM | POA: Diagnosis not present

## 2022-01-19 LAB — CBG MONITORING, ED: Glucose-Capillary: 107 mg/dL — ABNORMAL HIGH (ref 70–99)

## 2022-01-19 MED ORDER — ONDANSETRON 8 MG PO TBDP: 8.0000 mg | ORAL_TABLET | Freq: Three times a day (TID) | ORAL | 0 refills | Status: DC | PRN

## 2022-01-19 MED ORDER — ONDANSETRON 4 MG PO TBDP
ORAL_TABLET | ORAL | Status: AC
  Filled 2022-01-19: qty 2

## 2022-01-19 MED ORDER — ONDANSETRON 4 MG PO TBDP
8.0000 mg | ORAL_TABLET | Freq: Once | ORAL | Status: AC
  Administered 2022-01-19: 8 mg via ORAL

## 2022-01-19 NOTE — Discharge Instructions (Addendum)
Drink clear liquids such as water, Pedialyte, Gatorade, beef/chicken/vegetable broth. Sip small volumes all day long Eat crackers or toast, advance diet as tolerated.

## 2022-01-19 NOTE — ED Provider Notes (Signed)
[place]    CSN: [id number] Arrival date & time: 01/19/22  1257      History   Chief Complaint Chief Complaint  Patient presents with   Nausea    Vomiting    Diarrhea    HPI [name] is a 66 y.o. female.   Patient here concerned with vomiting and diarrhea x 3 days.  Started after eating chinese food.  She reports friend who ate the same meal also sick with similar sx.  She admits chills, denies URI sx, cough.  Vomitus is non-bilious, non-bloody.  She reports 10 episodes of vomiting last 24 hours, diarrhea x "a lot".     Past Medical History:  Diagnosis Date   Arthritis    Asthma    Colonic polyp    GERD (gastroesophageal reflux disease)    Hypertension    Obesity    Pain in limb    Smoker    Swelling of limb     Patient Active Problem List   Diagnosis Date Noted   Chronic throat clearing 07/17/2021   Stage 3a chronic kidney disease ([place]) 01/23/2021   Cataract, age-related 04/27/2020   Multiple somatic complaints 01/10/2019   Adenomatous polyp of colon 05/25/2018   History of colonic polyps 03/06/2017   Asthma, chronic, mild persistent, uncomplicated 35/32/9924   Chronic seasonal allergic rhinitis due to pollen 03/06/2017   Gastroesophageal reflux disease without esophagitis 10/10/2014   Ex-cigarette smoker 09/28/2013   Type 2 diabetes mellitus with complications ([place]) 26/83/4196   Obesity (BMI 30-39.9) 07/12/2012   Chronic back pain 07/12/2012   Hypertension associated with diabetes ([place]) 07/12/2012    Past Surgical History:  Procedure Laterality Date   BIOPSY BREAST Left 09/22/2018   no maglignancy   COLONOSCOPY  2009   Dr. Henrene Pastor   POLYPECTOMY      OB History   No obstetric history on file.      Home Medications    Prior to Admission medications   Medication Sig Start Date End Date Taking? Authorizing Provider  ondansetron (ZOFRAN-ODT) 8 MG disintegrating tablet Take 1 tablet (8 mg total) by mouth every 8 (eight)  hours as needed for nausea or vomiting. 01/19/22  Yes Peri Jefferson, PA-C  albuterol (ACCUNEB) 0.63 MG/3ML nebulizer solution Take 1 ampule by nebulization every 6 (six) hours as needed for wheezing.    [provider]  Aspirin-Salicylamide-Caffeine (BC HEADACHE POWDER PO) Take 1 packet by mouth as needed. Reported on 01/09/2016    [provider]  atorvastatin (LIPITOR) 40 MG tablet Take 1 tablet by mouth once daily 12/25/21   [name], MD  Blood Glucose Monitoring Suppl (ACCU-CHEK AVIVA PLUS) w/Device KIT USE AS DIRECTED TO CHECK BLOOD GLUCOSE 1 TO 2 TIMES DAILY 05/12/19   [name], MD  furosemide (LASIX) 40 MG tablet Take 1 tablet (40 mg total) by mouth daily. 11/26/21   [name], MD  glucose blood (ACCU-CHEK AVIVA PLUS) test strip USE ONE STRIP TO CHECK GLUCOSE ONCE TO TWICE DAILY 02/15/20   [name], MD  ibuprofen (ADVIL) 800 MG tablet TAKE 1 TABLET BY MOUTH EVERY 8 HOURS AS NEEDED 07/15/21   [name], MD  lisinopril-hydrochlorothiazide (ZESTORETIC) 20-12.5 MG tablet Take 1 tablet by mouth once daily 11/11/21   [name], MD  metFORMIN (GLUCOPHAGE) 500 MG tablet TAKE 1 TABLET BY MOUTH TWICE DAILY WITH A MEAL 11/04/21   [name], MD  methocarbamol (ROBAXIN) 500 MG tablet  Take 1 tablet (500 mg total) by mouth 2 (two) times daily as needed. 10/08/21   Aundra Dubin, PA-C  montelukast (SINGULAIR) 10 MG tablet Take 1 tablet (10 mg total) by mouth at bedtime. 11/07/21   [name], MD  pantoprazole (PROTONIX) 40 MG tablet Take 1 tablet (40 mg total) by mouth daily. 10/02/21   Collene Gobble, MD  predniSONE (STERAPRED UNI-PAK 21 TAB) 10 MG (21) TBPK tablet Take as directed Patient not taking: Reported on 11/07/2021 10/15/21   Nathaniel Man  [facility] HFA 108 [phone]) MCG/ACT inhaler Inhale 2 puffs by mouth twice daily 11/04/21   [name], MD  TRELEGY ELLIPTA 200-62.5-25 MCG/ACT AEPB INHALE 1 PUFF INTO LUNGS ONCE DAILY 01/03/22    [name], MD    Family History Family History  Problem Relation Age of Onset   Lung cancer Father    Cirrhosis Mother    Cirrhosis Brother    Colon cancer Neg Hx    Colon polyps Neg Hx    Esophageal cancer Neg Hx    Rectal cancer Neg Hx    Stomach cancer Neg Hx     Social History Social History   Tobacco Use   Smoking status: Former    Packs/day: 0.25    Years: 10.00    Pack years: 2.50    Types: Cigarettes   Smokeless tobacco: Never   Tobacco comments:    quit oct 2018  Vaping Use   Vaping Use: Never used  Substance Use Topics   Alcohol use: Yes    Comment: socially   Drug use: Yes    Types: Marijuana    Comment: social     Allergies   Patient has no known allergies.   Review of Systems Review of Systems  Constitutional:  Negative for chills, fatigue and fever.  HENT:  Negative for congestion, ear pain, nosebleeds, postnasal drip, rhinorrhea, sinus pressure, sinus pain and sore throat.   Eyes:  Negative for pain and redness.  Respiratory:  Negative for cough, shortness of breath and wheezing.   Gastrointestinal:  Positive for abdominal pain, diarrhea, nausea and vomiting. Negative for anal bleeding, blood in stool and constipation.  Musculoskeletal:  Negative for arthralgias and myalgias.  Skin:  Negative for rash.  Neurological:  Negative for light-headedness and headaches.  Hematological:  Negative for adenopathy. Does not bruise/bleed easily.  Psychiatric/Behavioral:  Negative for confusion and sleep disturbance.     Physical Exam Triage Vital Signs ED Triage Vitals  Enc Vitals Group     BP 01/19/22 1447 (!) 178/98     Pulse Rate 01/19/22 1446 98     Resp 01/19/22 1446 18     Temp 01/19/22 1446 98.8 F (37.1 C)     Temp Source 01/19/22 1446 Oral     SpO2 01/19/22 1446 98 %     Weight --      Height --      Head Circumference --      Peak Flow --      Pain Score 01/19/22 1447 0     Pain Loc --      Pain Edu? --      Excl. in [place]? --     No data found.  Updated Vital Signs BP (!) 178/98 (BP Location: Left Arm)    Pulse 98    Temp 98.8 F (37.1 C) (Oral)    Resp 18    SpO2 98%   Visual Acuity  Right Eye Distance:   Left Eye Distance:   Bilateral Distance:    Right Eye Near:   Left Eye Near:    Bilateral Near:     Physical Exam Vitals and nursing note reviewed.  Constitutional:      General: She is not in acute distress.    Appearance: Normal appearance. She is not ill-appearing.  HENT:     Head: Normocephalic and atraumatic.  Eyes:     General: No scleral icterus.    Extraocular Movements: Extraocular movements intact.     Conjunctiva/sclera: Conjunctivae normal.  Pulmonary:     Effort: Pulmonary effort is normal. No respiratory distress.  Abdominal:     Tenderness: There is abdominal tenderness in the right lower quadrant and left lower quadrant. There is no right CVA tenderness, left CVA tenderness, guarding or rebound. Negative signs include Murphy's sign, Rovsing's sign, McBurney's sign, psoas sign and obturator sign.  Musculoskeletal:     Cervical back: Normal range of motion. No rigidity.  Skin:    Capillary Refill: Capillary refill takes less than 2 seconds.     Coloration: Skin is not jaundiced.     Findings: No rash.  Neurological:     General: No focal deficit present.     Mental Status: She is alert and oriented to person, place, and time.     Motor: No weakness.     Gait: Gait normal.  Psychiatric:        Mood and Affect: Mood normal.        Behavior: Behavior normal.     UC Treatments / Results  Labs (all labs ordered are listed, but only abnormal results are displayed) Labs Reviewed  CBG MONITORING, ED - Abnormal; Notable for the following components:      Result Value   Glucose-Capillary 107 (*)    All other components within normal limits    EKG   Radiology No results found.  Procedures Procedures (including critical care time)  Medications Ordered in  UC Medications  ondansetron (ZOFRAN-ODT) disintegrating tablet 8 mg (has no administration in time range)    Initial Impression / Assessment and Plan / UC Course  I have reviewed the triage vital signs and the nursing notes.  Pertinent labs & imaging results that were available during my care of the patient were reviewed by me and considered in my medical decision making (see chart for details).     Take medication as prescribed  Drink clear liquids such as water, Pedialyte, Gatorade, beef/chicken/vegetable broth. Sip small volumes all day long Eat crackers or toast, advance diet as tolerated.   Final Clinical Impressions(s) / UC Diagnoses   Final diagnoses:  Nausea and vomiting, unspecified vomiting type     Discharge Instructions      Drink clear liquids such as water, Pedialyte, Gatorade, beef/chicken/vegetable broth. Sip small volumes all day long Eat crackers or toast, advance diet as tolerated.    ED Prescriptions     Medication Sig Dispense Auth. Provider   ondansetron (ZOFRAN-ODT) 8 MG disintegrating tablet Take 1 tablet (8 mg total) by mouth every 8 (eight) hours as needed for nausea or vomiting. 12 tablet Peri Jefferson, PA-C      PDMP not reviewed this encounter.   Peri Jefferson, PA-C 01/19/22 1610

## 2022-01-19 NOTE — ED Triage Notes (Signed)
Pt presents with vomiting and diarrhea x 2 days. She has not been able to keep anything on her stomach.

## 2022-01-20 ENCOUNTER — Ambulatory Visit (INDEPENDENT_AMBULATORY_CARE_PROVIDER_SITE_OTHER): Payer: Medicare Other

## 2022-01-20 DIAGNOSIS — E1159 Type 2 diabetes mellitus with other circulatory complications: Secondary | ICD-10-CM

## 2022-01-20 DIAGNOSIS — E118 Type 2 diabetes mellitus with unspecified complications: Secondary | ICD-10-CM

## 2022-01-20 NOTE — Patient Instructions (Signed)
Social Worker Visit Information  Goals we discussed today:  Patient Goals/Self-Care Activities patient will:   - Engage with [employer] to identify alternative housing options to accept section 8 voucher -Contact providers office as needed if symptoms continue or worsen   Patient verbalizes understanding of instructions and care plan provided today and agrees to view in [place]. Active MyChart status confirmed with patient.    Follow Up Plan:  No planned follow up at this time. Please contact me as needed.  [name], BSW, CDP Social Worker, Certified Dementia Practitioner [place] [facility] [phone]

## 2022-01-20 NOTE — Chronic Care Management (AMB) (Signed)
Chronic Care Management    Social Work Note  01/20/2022 Name: [name] MRN: [id number] DOB: 07-04-1956  [name] is a 66 y.o. year old female who is a primary care patient of [employer], Elyse Jarvis, MD. The CCM team was consulted to assist the patient with chronic disease management and/or care coordination needs related to:  HTN, DM II, Housing Instability .   Engaged with patient by telephone for follow up visit in response to provider referral for social work chronic care management and care coordination services.   Consent to Services:  The patient was given information about Chronic Care Management services, agreed to services, and gave verbal consent prior to initiation of services.  Please see initial visit note for detailed documentation.   Patient agreed to services and consent obtained.   Assessment: Review of patient past medical history, allergies, medications, and health status, including review of relevant consultants reports was performed today as part of a comprehensive evaluation and provision of chronic care management and care coordination services.     SDOH (Social Determinants of Health) assessments and interventions performed:  SDOH Interventions    Flowsheet Row Most Recent Value  SDOH Interventions   Housing Interventions Other (Comment)  [patient already engaged with section 8 caseworker]        Advanced Directives Status: Not addressed in this encounter.  CCM Care Plan  No Known Allergies  Outpatient Encounter Medications as of 01/20/2022  Medication Sig Note   albuterol (ACCUNEB) 0.63 MG/3ML nebulizer solution Take 1 ampule by nebulization every 6 (six) hours as needed for wheezing.    Aspirin-Salicylamide-Caffeine (BC HEADACHE POWDER PO) Take 1 packet by mouth as needed. Reported on 01/09/2016 11/29/2012: Prn    atorvastatin (LIPITOR) 40 MG tablet Take 1 tablet by mouth once daily    Blood Glucose Monitoring Suppl (ACCU-CHEK AVIVA  PLUS) w/Device KIT USE AS DIRECTED TO CHECK BLOOD GLUCOSE 1 TO 2 TIMES DAILY    furosemide (LASIX) 40 MG tablet Take 1 tablet (40 mg total) by mouth daily.    glucose blood (ACCU-CHEK AVIVA PLUS) test strip USE ONE STRIP TO CHECK GLUCOSE ONCE TO TWICE DAILY    ibuprofen (ADVIL) 800 MG tablet TAKE 1 TABLET BY MOUTH EVERY 8 HOURS AS NEEDED    lisinopril-hydrochlorothiazide (ZESTORETIC) 20-12.5 MG tablet Take 1 tablet by mouth once daily    metFORMIN (GLUCOPHAGE) 500 MG tablet TAKE 1 TABLET BY MOUTH TWICE DAILY WITH A MEAL    methocarbamol (ROBAXIN) 500 MG tablet Take 1 tablet (500 mg total) by mouth 2 (two) times daily as needed.    montelukast (SINGULAIR) 10 MG tablet Take 1 tablet (10 mg total) by mouth at bedtime.    ondansetron (ZOFRAN-ODT) 8 MG disintegrating tablet Take 1 tablet (8 mg total) by mouth every 8 (eight) hours as needed for nausea or vomiting.    pantoprazole (PROTONIX) 40 MG tablet Take 1 tablet (40 mg total) by mouth daily.    predniSONE (STERAPRED UNI-PAK 21 TAB) 10 MG (21) TBPK tablet Take as directed (Patient not taking: Reported on 11/07/2021)    PROAIR HFA 108 (90 Base) MCG/ACT inhaler Inhale 2 puffs by mouth twice daily    TRELEGY ELLIPTA 200-62.5-25 MCG/ACT AEPB INHALE 1 PUFF INTO LUNGS ONCE DAILY    Facility-Administered Encounter Medications as of 01/20/2022  Medication   0.9 %  sodium chloride infusion    Patient Active Problem List   Diagnosis Date Noted   Chronic throat clearing 07/17/2021  Stage 3a chronic kidney disease ([place]) 01/23/2021   Cataract, age-related 04/27/2020   Multiple somatic complaints 01/10/2019   Adenomatous polyp of colon 05/25/2018   History of colonic polyps 03/06/2017   Asthma, chronic, mild persistent, uncomplicated 13/07/6577   Chronic seasonal allergic rhinitis due to pollen 03/06/2017   Gastroesophageal reflux disease without esophagitis 10/10/2014   Ex-cigarette smoker 09/28/2013   Type 2 diabetes mellitus with complications  ([place]) 46/96/2952   Obesity (BMI 30-39.9) 07/12/2012   Chronic back pain 07/12/2012   Hypertension associated with diabetes ([place]) 07/12/2012    Conditions to be addressed/monitored: HTN and DMII; Housing barriers  Care Plan : General Social Work (Adult)  Updates made by [name] since 01/20/2022 12:00 AM  Completed 01/20/2022   Problem: Housing Resolved 01/20/2022     Goal: Housing Resources Identified Completed 01/20/2022  Start Date: 01/20/2022  Priority: High  Note:   Current Barriers:  Chronic disease management support and education needs related to HTN and DM  Housing barriers  Social Worker Clinical Goal(s):  patient will work with SW to identify and address any acute and/or chronic care coordination needs related to the self health management of HTN and DM  explore community resource options for unmet needs related to:  Housing  SW Interventions:  Inter-disciplinary care team collaboration (see longitudinal plan of care) Collaboration with [name], MD regarding development and update of comprehensive plan of care as evidenced by provider attestation and co-signature Communication received from PharmD indicating patient is in need of housing resources Telephonic visit completed with the patient to discuss the patients continues to face housing instability since previous SW outreach Discussed the apartment she has lived in for 15 years is under new ownership and she has until February to move out due to the decision they will no longer accept Section 8 vouchers. The patient was advised of this decision one year ago and has been hopeful the manager would change their mind Patient continues to be in contact with Section 8 caseworker and was advised they would raise her voucher amount to $900 for 1 bedroom apartment in hopes she could locate an alternative option Discussed the patient and her caseworker are working together to identify alternative housing options Advised  the patient she is linked to the best resource to assist her and this SW was unfortunately unable to provide additional resources Noted patient recent ED visit - patient reports she had food poisoning but is beginning to feel better and has been eating crackers, soup, and increasing intake of Gatorade and Pedialyte popsicles Encouraged the patient to contact Dr. 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 The patient will remain engaged with PharmD to address care management needs.      [name], BSW, CDP Social Worker, Certified Dementia Practitioner [place] [facility] [place] [facility] [phone]

## 2022-01-28 ENCOUNTER — Other Ambulatory Visit: Payer: Self-pay | Admitting: Family Medicine

## 2022-01-28 DIAGNOSIS — J4530 Mild persistent asthma, uncomplicated: Secondary | ICD-10-CM

## 2022-01-28 DIAGNOSIS — I152 Hypertension secondary to endocrine disorders: Secondary | ICD-10-CM

## 2022-01-28 DIAGNOSIS — E1159 Type 2 diabetes mellitus with other circulatory complications: Secondary | ICD-10-CM

## 2022-01-28 DIAGNOSIS — E118 Type 2 diabetes mellitus with unspecified complications: Secondary | ICD-10-CM

## 2022-01-28 NOTE — Telephone Encounter (Signed)
[facility] is requesting to fil pt albuterol inhaler. Please advise [facility]

## 2022-02-10 DIAGNOSIS — J45998 Other asthma: Secondary | ICD-10-CM | POA: Diagnosis not present

## 2022-02-17 ENCOUNTER — Other Ambulatory Visit: Payer: Self-pay | Admitting: Family Medicine

## 2022-02-17 ENCOUNTER — Encounter: Payer: Self-pay | Admitting: Family Medicine

## 2022-02-17 NOTE — Telephone Encounter (Signed)
[facility] is requesting to fill pt trelegy. Please advise  ?

## 2022-02-21 ENCOUNTER — Other Ambulatory Visit: Payer: Self-pay | Admitting: Family Medicine

## 2022-02-21 DIAGNOSIS — F17200 Nicotine dependence, unspecified, uncomplicated: Secondary | ICD-10-CM

## 2022-03-03 ENCOUNTER — Other Ambulatory Visit: Payer: Self-pay | Admitting: Medical

## 2022-03-03 DIAGNOSIS — J4530 Mild persistent asthma, uncomplicated: Secondary | ICD-10-CM

## 2022-03-03 NOTE — Telephone Encounter (Signed)
This was refilled a month ago. Pt has upcoming appt as well ?

## 2022-03-04 ENCOUNTER — Other Ambulatory Visit: Payer: Self-pay | Admitting: Medical

## 2022-03-04 DIAGNOSIS — J4530 Mild persistent asthma, uncomplicated: Secondary | ICD-10-CM

## 2022-03-05 ENCOUNTER — Other Ambulatory Visit: Payer: Self-pay | Admitting: Family Medicine

## 2022-03-07 ENCOUNTER — Telehealth: Payer: Self-pay

## 2022-03-07 DIAGNOSIS — J45998 Other asthma: Secondary | ICD-10-CM | POA: Diagnosis not present

## 2022-03-07 DIAGNOSIS — J4530 Mild persistent asthma, uncomplicated: Secondary | ICD-10-CM

## 2022-03-07 MED ORDER — ALBUTEROL SULFATE HFA 108 (90 BASE) MCG/ACT IN AERS: 2.0000 | INHALATION_SPRAY | Freq: Two times a day (BID) | RESPIRATORY_TRACT | 0 refills | Status: DC

## 2022-03-07 NOTE — Telephone Encounter (Signed)
Appt made

## 2022-03-07 NOTE — Telephone Encounter (Signed)
Pt needs refill albuterol inhaler said going out of town and wants to have enough to take with her.  Ok to fill per Dr. Redmond School, needs appt for follow up asthma.

## 2022-03-10 ENCOUNTER — Ambulatory Visit: Payer: Medicare Other | Admitting: Family Medicine

## 2022-03-18 ENCOUNTER — Telehealth: Payer: Self-pay | Admitting: Family Medicine

## 2022-03-18 MED ORDER — ALBUTEROL SULFATE 108 (90 BASE) MCG/ACT IN AEPB: 2.0000 | INHALATION_SPRAY | Freq: Four times a day (QID) | RESPIRATORY_TRACT | 1 refills | Status: DC | PRN

## 2022-03-18 NOTE — Telephone Encounter (Signed)
Pt called  albuterol is not covered on her insurance ? ?They told her that Proair Respiclick and Levalbuterol HFA  are covered meds on her plan ?

## 2022-03-19 ENCOUNTER — Ambulatory Visit (INDEPENDENT_AMBULATORY_CARE_PROVIDER_SITE_OTHER): Payer: Medicare Other | Admitting: Family Medicine

## 2022-03-19 ENCOUNTER — Encounter: Payer: Self-pay | Admitting: Family Medicine

## 2022-03-19 ENCOUNTER — Telehealth: Payer: Self-pay

## 2022-03-19 VITALS — BP 140/76 | HR 70 | Temp 98.6°F | Wt 226.2 lb

## 2022-03-19 DIAGNOSIS — J4530 Mild persistent asthma, uncomplicated: Secondary | ICD-10-CM | POA: Diagnosis not present

## 2022-03-19 NOTE — Patient Instructions (Signed)
Wait 10 to 15 minutes before using the inhaler ?

## 2022-03-19 NOTE — Telephone Encounter (Signed)
P.A. ALBUTEROL INHALER received, pt was switched to covered Proair respi-click

## 2022-03-19 NOTE — Progress Notes (Signed)
? ?  Subjective:  ? ? Patient ID: [name], female    DOB: [birth year], 66 y.o.   MRN: [id number] ? ?HPI ?She is here for a follow-up concerning her asthma and rescue inhaler.  She does use Trelegy once per day but is using the inhaler usually twice per day.  On further questioning of her she usually uses this if she walks up a flight of stairs and becomes short of breath.  She states that it will usually work very quickly within a few minutes. ? ? ?Review of Systems ? ?   ?Objective:  ? Physical Exam ?Alert and in no distress otherwise not examined ? ? ? ?   ?Assessment & Plan:  ? ?Asthma, chronic, mild persistent, uncomplicated ?I explained that I did not think the medicine could work that quickly within the first couple minutes and recommend that she sit and rest explaining that I think she is misinterpreting the shortness of breath being asthma related when it is probably related to deconditioning.  Encouraged her to not use the inhaler at least for 10 minutes to see if the symptoms will go away.  She is again very vague on her symptoms and then tried to say that it was 10 or 15 minutes later that she would notice an improvement. ?

## 2022-03-22 ENCOUNTER — Other Ambulatory Visit: Payer: Self-pay | Admitting: Family Medicine

## 2022-03-22 DIAGNOSIS — E118 Type 2 diabetes mellitus with unspecified complications: Secondary | ICD-10-CM

## 2022-04-10 ENCOUNTER — Telehealth: Payer: Medicare Other

## 2022-04-16 ENCOUNTER — Other Ambulatory Visit: Payer: Self-pay | Admitting: Physician Assistant

## 2022-04-16 ENCOUNTER — Telehealth: Payer: Self-pay | Admitting: Pharmacist

## 2022-04-16 NOTE — Chronic Care Management (AMB) (Signed)
    Chronic Care Management Pharmacy Assistant   Name: [name]  MRN: [id number] DOB: September 16, 1956  04/16/22 APPOINTMENT REMINDER    Patient  was reminded to have all medications, supplements and any blood glucose and blood pressure readings available for review with Jeni Salles, Pharm. D, for telephone visit on 04/16/22 at 3:30.    Care Gaps: PNA Vaccine - Overdue DEXA - Overdue Colonoscopy - Overdue Foot Exam - Overdue  Star Rating Drug: Atorvastatin (Lipitor) 40 mg - Last filled 04/04/22 90 DS at [facility]  Lisinopril - HCTZ 20-12.5 mg - Last filled 02/04/22 90 DS at [facility] Metformin (Glucophage) 500 mg - Last filled 03/24/22 90 DS at [facility]        Medications: Outpatient Encounter Medications as of 04/16/2022  Medication Sig Note   Albuterol Sulfate (PROAIR RESPICLICK) 748 (90 Base) MCG/ACT AEPB Inhale 2 puffs into the lungs every 6 (six) hours as needed.    Aqua[employer]ance Lancets 30G MISC CHECK BLOOD SUGAR 2 TIMES A DAY    Aspirin-Salicylamide-Caffeine (BC HEADACHE POWDER PO) Take 1 packet by mouth as needed. Reported on 01/09/2016 (Patient not taking: Reported on 03/19/2022) 11/29/2012: Prn    atorvastatin (LIPITOR) 40 MG tablet Take 1 tablet by mouth once daily    Blood Glucose Monitoring Suppl (ACCU-CHEK AVIVA PLUS) w/Device KIT USE AS DIRECTED TO CHECK BLOOD GLUCOSE 1 TO 2 TIMES DAILY    furosemide (LASIX) 40 MG tablet Take 1 tablet (40 mg total) by mouth daily.    glucose blood (ACCU-CHEK AVIVA PLUS) test strip USE ONE STRIP TO CHECK GLUCOSE ONCE TO TWICE DAILY    ibuprofen (ADVIL) 800 MG tablet TAKE 1 TABLET BY MOUTH EVERY 8 HOURS AS NEEDED    lisinopril-hydrochlorothiazide (ZESTORETIC) 20-12.5 MG tablet Take 1 tablet by mouth once daily    metFORMIN (GLUCOPHAGE) 500 MG tablet TAKE 1 TABLET BY MOUTH TWICE DAILY WITH A MEAL    methocarbamol (ROBAXIN) 500 MG tablet Take 1 tablet (500 mg total) by mouth 2 (two) times daily as needed.    montelukast (SINGULAIR) 10 MG  tablet Take 1 tablet (10 mg total) by mouth at bedtime.    ondansetron (ZOFRAN-ODT) 8 MG disintegrating tablet Take 1 tablet (8 mg total) by mouth every 8 (eight) hours as needed for nausea or vomiting.    pantoprazole (PROTONIX) 40 MG tablet Take 1 tablet (40 mg total) by mouth daily.    predniSONE (STERAPRED UNI-PAK 21 TAB) 10 MG (21) TBPK tablet Take as directed (Patient not taking: Reported on 11/07/2021)    TRELEGY ELLIPTA 200-62.5-25 MCG/ACT AEPB Inhale 1 puff by mouth once daily    Facility-Administered Encounter Medications as of 04/16/2022  Medication   0.9 %  sodium chloride infusion    Ned Clines [place] Clinical Pharmacist Assistant [phone]

## 2022-04-17 ENCOUNTER — Other Ambulatory Visit: Payer: Self-pay | Admitting: Family Medicine

## 2022-04-17 ENCOUNTER — Ambulatory Visit (INDEPENDENT_AMBULATORY_CARE_PROVIDER_SITE_OTHER): Payer: Medicare Other | Admitting: Pharmacist

## 2022-04-17 DIAGNOSIS — E118 Type 2 diabetes mellitus with unspecified complications: Secondary | ICD-10-CM

## 2022-04-17 DIAGNOSIS — I152 Hypertension secondary to endocrine disorders: Secondary | ICD-10-CM

## 2022-04-17 MED ORDER — METFORMIN HCL ER (OSM) 1000 MG PO TB24: 1000.0000 mg | ORAL_TABLET | Freq: Every day | ORAL | 1 refills | Status: DC

## 2022-04-17 NOTE — Patient Instructions (Signed)
Hi [facility],  It was great to catch up again!  Please reach out to me if you have any questions or need anything!  Best, Maddie  Jeni Salles, PharmD, [place] [phone]   Visit Information   Goals Addressed   None    Patient Care Plan: CCM Pharmacy Care Plan     Problem Identified: Problem: Hypertension, Hyperlipidemia, Diabetes, GERD, Asthma, Chronic Kidney Disease, Overactive Bladder, and Allergic Rhinitis      Long-Range Goal: Patient-Specific Goal   Start Date: 10/10/2021  Expected End Date: 10/10/2021  Recent Progress: On track  Priority: High  Note:   Current Barriers:  Unable to independently monitor therapeutic efficacy Unable to maintain control of diabetes  Pharmacist Clinical Goal(s):  Patient will achieve adherence to monitoring guidelines and medication adherence to achieve therapeutic efficacy achieve control of diabetes as evidenced by A1c  through collaboration with PharmD and provider.   Interventions: 1:1 collaboration with [name], MD regarding development and update of comprehensive plan of care as evidenced by provider attestation and co-signature Inter-disciplinary care team collaboration (see longitudinal plan of care) Comprehensive medication review performed; medication list updated in electronic medical record  Hypertension (BP goal <140/90) -Uncontrolled -Current treatment: Lisinopril-HCTZ 20-12.5 mg 1 tablet daily - Appropriate, Query effective, Safe, Accessible -Medications previously tried: none  -Current home readings: does not check at home (owns a wrist cuff but has not calibrated it with doctor's office) -Current dietary habits: doesn't cook with salt at all except uses some on grits; was eating a lot of bacon but cut back -Current exercise habits: unable to with shortness of breath -Denies hypotensive/hypertensive symptoms -Educated on BP goals and benefits of medications for  prevention of heart attack, stroke and kidney damage; Importance of home blood pressure monitoring; Proper BP monitoring technique; -Counseled to monitor BP at home weekly, document, and provide log at future appointments -Counseled on diet and exercise extensively Recommended to continue current medication  Hyperlipidemia: (LDL goal < 70) -Not ideally controlled -Current treatment: Atorvastatin 40 mg 1 tablet daily - Appropriate, Query effective, Safe, Accessible -Medications previously tried: none  -Current dietary patterns: not eating much red meat -Current exercise habits: not able to exercise -Educated on Cholesterol goals;  Benefits of statin for ASCVD risk reduction; -Counseled on diet and exercise extensively Recommended to continue current medication  Diabetes (A1c goal <7%) -Uncontrolled -Current medications: Metformin 500 mg 1 tablet twice daily - doesn't always take it twice daily - Appropriate, Query effective, Safe, Accessible -Medications previously tried: none  -Current home glucose readings fasting glucose: 120-130; 97 (checking once a week) - haven't check it in 2 weeks post prandial glucose: n/a -Denies hypoglycemic/hyperglycemic symptoms -Current meal patterns: eating out once or twice a month breakfast: honey nut cheerios   lunch: lunch at the school  dinner: smoothie or heats up something (air fryer); chicken tenders, fish and shrimp and vegetables (spinach, broccoli, string beans) snacks: n/a drinks: orange juice with breakfast -Current exercise: not able to -Educated on A1c and blood sugar goals; Benefits of routine self-monitoring of blood sugar; Carbohydrate counting and/or plate method -Counseled to check feet daily and get yearly eye exams -Counseled on diet and exercise extensively Consider SGLT2 for kidney benefits.  Asthma (Goal: control symptoms) -Uncontrolled -Current treatment  Trelegy ellipta 200-62.5-25 mcg 1 puff daily - Appropriate,  Query effective, Safe, Accessible Proair HFA 2 puffs as needed - Appropriate, Effective, Safe, Accessible Albuterol nebulizer every 6 hours as needed - Appropriate, Effective,  Safe, Accessible Montelukast 10 mg 1 tablet daily - Appropriate, Query effective, Safe, Accessible -Medications previously tried: n/a  -Pulmonary function testing: 10/02/21 -Patient reports consistent use of maintenance inhaler -Frequency of rescue inhaler use: up to 4 times a day -Counseled on Proper inhaler technique; Differences between maintenance and rescue inhalers -Counseled on diet and exercise extensively Recommended to continue current medication  GERD (Goal: minimize symptoms of heart burn) -Controlled -Current treatment  Pantoprazole 40 mg 1 tablet daily - Appropriate, Effective, Safe, Accessible -Medications previously tried: none  -Recommended to continue current medication  Pain (Goal: minimize pain) -Not ideally controlled -Current treatment  Methocarbamol 500 mg 1 tablet twice daily as needed - Appropriate, Effective, Safe, Accessible Tramadol 50 mg 1-2 tablets three times daily as needed - Appropriate, Effective, Safe, Accessible Ibuprofen 800 mg 1 tablet every 8 hours as needed - Appropriate, Effective, Query Safe, Accessible -Medications previously tried: none  -Counseled on limiting use of NSAIDs due to risk for kidney damage and relying on Tylenol.  Swelling (Goal: minimize swelling) -Controlled -Current treatment  Furosemide 40 mg 1 tablet daily as needed - Appropriate, Effective, Safe, Accessible -Medications previously tried: none  -Counseled on diet and exercise extensively Recommended to continue current medication  Health Maintenance -Vaccine gaps: COVID booster, Prevnar 20 -Current therapy:  BC headache powder (rarely uses) Multivitamin 1 tablet daily - not taking right now -Educated on Cost vs benefit of each product must be carefully weighed by individual  consumer -Patient is satisfied with current therapy and denies issues -Recommended to continue current medication  Patient Goals/Self-Care Activities Patient will:  - take medications as prescribed as evidenced by patient report and record review check glucose weekly, document, and provide at future appointments check blood pressure weekly, document, and provide at future appointments  Follow Up Plan: The care management team will reach out to the patient again over the next 30 days.      Patient Care Plan: General Social Work (Adult)  Completed 01/20/2022   Problem Identified: Housing Resolved 01/20/2022     Goal: Housing Resources Identified Completed 01/20/2022  Start Date: 01/20/2022  Priority: High  Note:   Current Barriers:  Chronic disease management support and education needs related to HTN and DM  Housing barriers  Social Worker Clinical Goal(s):  patient will work with SW to identify and address any acute and/or chronic care coordination needs related to the self health management of HTN and DM  explore community resource options for unmet needs related to:  Housing  SW Interventions:  Inter-disciplinary care team collaboration (see longitudinal plan of care) Collaboration with [name], MD regarding development and update of comprehensive plan of care as evidenced by provider attestation and co-signature Communication received from PharmD indicating patient is in need of housing resources Telephonic visit completed with the patient to discuss the patients continues to face housing instability since previous SW outreach Discussed the apartment she has lived in for 15 years is under new ownership and she has until February to move out due to the decision they will no longer accept Section [employer] vouchers. The patient was advised of this decision one year ago and has been hopeful the manager would change their mind Patient continues to be in contact with [employer]  caseworker and was advised they would raise her voucher amount to $900 for 1 bedroom apartment in hopes she could locate an alternative option Discussed the patient and her caseworker are working together to identify alternative housing options Advised  the patient she is linked to the best resource to assist her and this SW was unfortunately unable to provide additional resources Noted patient recent ED visit - patient reports she had food poisoning but is beginning to feel better and has been eating crackers, soup, and increasing intake of Gatorade and Pedialyte popsicles Encouraged the patient to contact Dr. 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 Active MyChart status and patient understanding of how to access instructions and care plan via MyChart confirmed with patient.    The pharmacy team will reach out to the patient again over the next 14 days.   Viona Gilmore, [facility] [place] [facility]

## 2022-04-17 NOTE — Progress Notes (Signed)
Chronic Care Management Pharmacy Note  04/17/2022 Name:  [name] MRN:  [id number] DOB:  August 17, 1956  Summary: A1c not at goal < 7% BP not at goal <140/90 Pt is not adherent to medications  Recommendations/Changes made from today's visit: -Recommend switching to metformin XR or to [place] or Jardiance for CKD benefit -Recommended reordering DEXA scan -Recommended to start monitoring BP at home -Recommended against routine use of NSAIDs due to kidney function  Plan: Follow up DM and BP assessment in 1-2 months Follow up in 5 months  Subjective: [name] is an 66 y.o. year old female who is a primary patient of [name], MD.  The CCM team was consulted for assistance with disease management and care coordination needs.    Engaged with patient by telephone for follow up visit in response to provider referral for pharmacy case management and/or care coordination services.   Consent to Services:  The patient was given information about Chronic Care Management services, agreed to services, and gave verbal consent prior to initiation of services.  Please see initial visit note for detailed documentation.   Patient Care Team: [name], MD as PCP - General (Family Medicine) Viona Gilmore, [facility] as Pharmacist (Pharmacist) [name] as [place] Management  Recent office visits: 03/19/22 Jill Alexanders, MD: Patient presented for asthma consult. Prescribed Proair rescue inhaler.  01/20/22 [name], LCSW: Patient presented for SW CCM visit to address housing needs.  11/15/21 [name] - Patient presented for CCM social work visit.   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 Prescribed Montelukast Sodium.  Recent consult visits: 10/02/21 Baltazar Apo MD (Pulmonary Disease) - seen for asthma and other issues. Patient started on pantoprazole  53m. Follow up in 6 months.    10/02/21 RBaltazar ApoMD (Pulmonary Disease) - seen for asthma. No medication changes. PFT performed. No follow up noted.    07/17/21 RBaltazar ApoMD (Pulmonary Disease) - seen for chronic throat clearing. No medication changes. Full PFT at next office visit and follow up with Dr. BLamonte Sakai    07/05/21 Kristoffer Leamon PT (Physical Therapy) - seen for chronic bilateral low back pain, unspecified whether sciatica present and other issues. No medication changes. Perform self exercises at home. No follow up noted.   [facility]: [facility]. Prescribed Zofran PRN.   Objective:  Lab Results  Component Value Date   CREATININE 1.27 (H) 01/22/2021   BUN 24 01/22/2021   GFRNONAA 44 (L) 01/22/2021   GFRAA 51 (L) 01/22/2021   NA 145 (H) 01/22/2021   K 4.2 01/22/2021   CALCIUM 9.5 01/22/2021   CO2 22 01/22/2021   GLUCOSE 172 (H) 01/22/2021    Lab Results  Component Value Date/Time   HGBA1C 7.2 (A) 11/07/2021 03:37 PM   HGBA1C 7.8 (A) 03/27/2021 03:37 PM   HGBA1C 5.4 06/09/2019 12:00 AM   HGBA1C 7.7 05/12/2019 12:00 AM   MICROALBUR 12.0 02/27/2020 04:27 PM   MICROALBUR 15.5 03/01/2015 10:24 AM    Last diabetic Eye exam:  Lab Results  Component Value Date/Time   HMDIABEYEEXA No Retinopathy 05/16/2021 12:00 AM    Last diabetic Foot exam: No results found for: HMDIABFOOTEX   Lab Results  Component Value Date   CHOL 144 08/30/2020   HDL 55 08/30/2020   LDLCALC  71 08/30/2020   TRIG 100 08/30/2020   CHOLHDL 2.6 08/30/2020       Latest Ref Rng & Units 01/22/2021    1:50 PM 08/30/2020   10:51 AM 02/27/2020    3:13 PM  Hepatic Function  Total Protein 6.0 - 8.5 g/dL 7.0   6.9   7.4    Albumin 3.8 - 4.8 g/dL 4.4   4.4   4.5    AST 0 - 40 IU/L 17   12   14     ALT 0 - 32 IU/L 29   17   17     Alk Phosphatase 44 - 121 IU/L 75   61   63    Total Bilirubin 0.0 -  1.2 mg/dL 0.3   0.4   0.2      No results found for: TSH, FREET4     Latest Ref Rng & Units 01/22/2021    1:50 PM 08/30/2020   10:51 AM 02/27/2020    3:13 PM  CBC  WBC 3.4 - 10.8 x10E3/uL 8.1   7.9   9.4    Hemoglobin 11.1 - 15.9 g/dL 13.9   12.1   12.0    Hematocrit 34.0 - 46.6 % 42.0   35.5   35.6    Platelets 150 - 450 x10E3/uL 269   366   368      No results found for: VD25OH  Clinical ASCVD: No  The 10-year ASCVD risk score (Arnett DK, et al., 2019) is: 18.9%   Values used to calculate the score:     Age: 44 years     Sex: Female     Is Non-Hispanic African American: Yes     Diabetic: Yes     Tobacco smoker: No     Systolic Blood Pressure: 573 mmHg     Is BP treated: No     HDL Cholesterol: 55 mg/dL     Total Cholesterol: 144 mg/dL       11/07/2021    2:55 PM 08/30/2020    9:41 AM 05/25/2018   10:06 AM  Depression screen PHQ 2/9  Decreased Interest 0 0 0  Down, Depressed, Hopeless 0 0 0  PHQ - 2 Score 0 0 0     Social History   Tobacco Use  Smoking Status Former   Packs/day: 0.25   Years: 10.00   Pack years: 2.50   Types: Cigarettes  Smokeless Tobacco Never  Tobacco Comments   quit oct 2018   BP Readings from Last 3 Encounters:  03/19/22 140/76  01/19/22 (!) 178/98  11/07/21 130/78   Pulse Readings from Last 3 Encounters:  03/19/22 70  01/19/22 98  11/07/21 78   Wt Readings from Last 3 Encounters:  03/19/22 226 lb 3.2 oz (102.6 kg)  11/07/21 218 lb 9.6 oz (99.2 kg)  10/02/21 224 lb 3.2 oz (101.7 kg)   BMI Readings from Last 3 Encounters:  03/19/22 34.39 kg/m  11/07/21 33.24 kg/m  10/02/21 34.09 kg/m    Assessment/Interventions: Review of patient past medical history, allergies, medications, health status, including review of consultants reports, laboratory and other test data, was performed as part of comprehensive evaluation and provision of chronic care management services.   SDOH:  (Social Determinants of Health) assessments and  interventions performed: Yes   SDOH Screenings   Alcohol Screen: Not on file  Depression (PHQ2-9): Low Risk    PHQ-2 Score: 0  Financial Resource Strain: High Risk   Difficulty of Paying Living  Expenses: Hard  Food Insecurity: Not on file  Housing: Medium Risk   Last Housing Risk Score: 1  Physical Activity: Not on file  Social Connections: Not on file  Stress: Not on file  Tobacco Use: Medium Risk   Smoking Tobacco Use: Former   Smokeless Tobacco Use: Never   Passive Exposure: Not on file  Transportation Needs: Unmet Transportation Needs   Lack of Transportation (Medical): Yes   Lack of Transportation (Non-Medical): Yes   CCM Care Plan  No Known Allergies  Medications Reviewed Today     Reviewed by Viona Gilmore, [facility] (Pharmacist) on 04/17/22 at 1444  Med List Status: <None>   Medication Order Taking? Sig Documenting Provider Last Dose Status Informant  0.9 %  sodium chloride infusion [id number]   Irene Shipper, MD  Active   Albuterol Sulfate (PROAIR RESPICLICK) 947 (90 Base) MCG/ACT AEPB [id number] No Inhale 2 puffs into the lungs every 6 (six) hours as needed. [name], MD Taking Active   AquaLance Lancets 30G MISC [id number] No CHECK BLOOD SUGAR 2 TIMES A DAY [name], MD Taking Active   Aspirin-Salicylamide-Caffeine [facility] HEADACHE POWDER PO) [id number] No Take 1 packet by mouth as needed. Reported on 01/09/2016  Patient not taking: Reported on 03/19/2022   [provider] Not Taking Active            Med Note (SCALES, CHANDRA L   Mon Nov 29, 2012  4:38 PM) Prn   atorvastatin (LIPITOR) 40 MG tablet [id number] No Take 1 tablet by mouth once daily [name], MD Taking Active   Blood Glucose Monitoring Suppl (ACCU-CHEK AVIVA PLUS) w/Device KIT [id number] No USE AS DIRECTED TO CHECK BLOOD GLUCOSE 1 TO 2 TIMES DAILY [name], MD Taking Active   furosemide (LASIX) 40 MG tablet [id number] No Take 1 tablet (40 mg total) by mouth daily. [name], MD Taking Active   glucose blood (ACCU-CHEK AVIVA PLUS) test strip [id number] No USE ONE STRIP TO CHECK GLUCOSE ONCE TO TWICE DAILY [name], MD Taking Active   ibuprofen (ADVIL) 800 MG tablet [id number] No TAKE 1 TABLET BY MOUTH EVERY 8 HOURS AS NEEDED [name], MD Taking Active   lisinopril-hydrochlorothiazide (ZESTORETIC) 20-12.5 MG tablet [id number] No Take 1 tablet by mouth once daily [name], MD Taking Active   metFORMIN (GLUCOPHAGE) 500 MG tablet [id number]  TAKE 1 TABLET BY MOUTH TWICE DAILY WITH A MEAL [name], MD  Active   methocarbamol (ROBAXIN) 500 MG tablet [id number] No Take 1 tablet (500 mg total) by mouth 2 (two) times daily as needed. Aundra Dubin, PA-C Taking Active   montelukast (SINGULAIR) 10 MG tablet [id number] No Take 1 tablet (10 mg total) by mouth at bedtime. [name], MD Taking Active   ondansetron (ZOFRAN-ODT) 8 MG disintegrating tablet [id number] No Take 1 tablet (8 mg total) by mouth every 8 (eight) hours as needed for nausea or vomiting. Peri Jefferson, PA-C Taking Active   pantoprazole (PROTONIX) 40 MG tablet [id number] No Take 1 tablet (40 mg total) by mouth daily. Collene Gobble, MD Taking Active   Patient not taking:  Discontinued 04/17/22 1444 (Completed Course)   TRELEGY ELLIPTA 200-62.5-25 MCG/ACT AEPB [id number] No Inhale 1 puff by mouth once daily [name], MD Taking Active             Patient Active Problem List   Diagnosis Date Noted   Chronic throat clearing  07/17/2021   Stage 3a chronic kidney disease ([place]) 01/23/2021   Cataract, age-related 04/27/2020   Multiple somatic complaints 01/10/2019   Adenomatous polyp of colon 05/25/2018   History of colonic polyps 03/06/2017   Asthma, chronic, mild persistent, uncomplicated 91/63/8466   Chronic seasonal allergic rhinitis due to pollen 03/06/2017   Gastroesophageal reflux disease without esophagitis 10/10/2014   Ex-cigarette smoker 09/28/2013   Type 2  diabetes mellitus with complications ([place]) 59/93/5701   Obesity (BMI 30-39.9) 07/12/2012   Chronic back pain 07/12/2012   Hypertension associated with diabetes ([place]) 07/12/2012    Immunization History  Administered Date(s) Administered   Hepatitis B 08/13/2012, 09/10/2012   Hepatitis B, adult 09/28/2013   Moderna Covid-19 Vaccine Bivalent Booster 15yr & up 11/07/2021   Moderna SARS-COV2 Booster Vaccination 11/08/2020   Moderna Sars-Covid-2 Vaccination 03/08/2020, 04/03/2020   PPD Test 08/08/2014, 09/10/2015, 09/01/2016   Pneumococcal Conjugate-13 03/06/2017   Pneumococcal Polysaccharide-23 08/13/2012   Tdap 08/13/2012   Zoster Recombinat (Shingrix) 01/12/2017, 07/18/2017   Zoster, Live 10/10/2014   Patient reports her breathing has been much better with use of the prednisone. She had an old medrol dosepak that she has been using lately and it was helped with her breathing and pain. Patient plans to call ortho to get a refill. Cautioned against frequent use of steroids due to current diabetes.  Conditions to be addressed/monitored:  Hypertension, Hyperlipidemia, Diabetes, GERD, Asthma, Chronic Kidney Disease, and Allergic Rhinitis  Conditions addressed this visit: Hypertension, CKD, diabetes  Care Plan : CCM Pharmacy Care Plan  Updates made by PViona Gilmore RSpring [place]leys[place] 04/17/2022 12:00 AM     Problem: Problem: Hypertension, Hyperlipidemia, Diabetes, GERD, Asthma, Chronic Kidney Disease, Overactive Bladder, and Allergic Rhinitis      Long-Range Goal: Patient-Specific Goal   Start Date: 10/10/2021  Expected End Date: 10/10/2021  Recent Progress: On track  Priority: High  Note:   Current Barriers:  Unable to independently monitor therapeutic efficacy Unable to maintain control of diabetes  Pharmacist Clinical Goal(s):  Patient will achieve adherence to monitoring guidelines and medication adherence to achieve therapeutic efficacy achieve control of diabetes as evidenced  by A1c  through collaboration with PharmD and provider.   Interventions: 1:1 collaboration with LDenita Lung MD regarding development and update of comprehensive plan of care as evidenced by provider attestation and co-signature Inter-disciplinary care team collaboration (see longitudinal plan of care) Comprehensive medication review performed; medication list updated in electronic medical record  Hypertension (BP goal <140/90) -Uncontrolled -Current treatment: Lisinopril-HCTZ 20-12.5 mg 1 tablet daily - Appropriate, Query effective, Safe, Accessible -Medications previously tried: none  -Current home readings: does not check at home (owns a wrist cuff but has not calibrated it with doctor's office) -Current dietary habits: doesn't cook with salt at all except uses some on grits; was eating a lot of bacon but cut back -Current exercise habits: unable to with shortness of breath -Denies hypotensive/hypertensive symptoms -Educated on BP goals and benefits of medications for prevention of heart attack, stroke and kidney damage; Importance of home blood pressure monitoring; Proper BP monitoring technique; -Counseled to monitor BP at home weekly, document, and provide log at future appointments -Counseled on diet and exercise extensively Recommended to continue current medication  Hyperlipidemia: (LDL goal < 70) -Not ideally controlled -Current treatment: Atorvastatin 40 mg 1 tablet daily - Appropriate, Query effective, Safe, Accessible -Medications previously tried: none  -Current dietary patterns: not eating much red meat -Current exercise habits: not able to exercise -Educated on Cholesterol  goals;  Benefits of statin for ASCVD risk reduction; -Counseled on diet and exercise extensively Recommended to continue current medication  Diabetes (A1c goal <7%) -Uncontrolled -Current medications: Metformin 500 mg 1 tablet twice daily - doesn't always take it twice daily - Appropriate,  Query effective, Safe, Accessible -Medications previously tried: none  -Current home glucose readings fasting glucose: 120-130; 97 (checking once a week) - haven't check it in 2 weeks post prandial glucose: n/a -Denies hypoglycemic/hyperglycemic symptoms -Current meal patterns: eating out once or twice a month breakfast: honey nut cheerios   lunch: lunch at the school  dinner: smoothie or heats up something (air fryer); chicken tenders, fish and shrimp and vegetables (spinach, broccoli, string beans) snacks: n/a drinks: orange juice with breakfast -Current exercise: not able to -Educated on A1c and blood sugar goals; Benefits of routine self-monitoring of blood sugar; Carbohydrate counting and/or plate method -Counseled to check feet daily and get yearly eye exams -Counseled on diet and exercise extensively Consider SGLT2 for kidney benefits.  Asthma (Goal: control symptoms) -Uncontrolled -Current treatment  Trelegy ellipta 200-62.5-25 mcg 1 puff daily - Appropriate, Query effective, Safe, Accessible Proair HFA 2 puffs as needed - Appropriate, Effective, Safe, Accessible Albuterol nebulizer every 6 hours as needed - Appropriate, Effective, Safe, Accessible Montelukast 10 mg 1 tablet daily - Appropriate, Query effective, Safe, Accessible -Medications previously tried: n/a  -Pulmonary function testing: 10/02/21 -Patient reports consistent use of maintenance inhaler -Frequency of rescue inhaler use: up to 4 times a day -Counseled on Proper inhaler technique; Differences between maintenance and rescue inhalers -Counseled on diet and exercise extensively Recommended to continue current medication  GERD (Goal: minimize symptoms of heart burn) -Controlled -Current treatment  Pantoprazole 40 mg 1 tablet daily - Appropriate, Effective, Safe, Accessible -Medications previously tried: none  -Recommended to continue current medication  Pain (Goal: minimize pain) -Not ideally  controlled -Current treatment  Methocarbamol 500 mg 1 tablet twice daily as needed - Appropriate, Effective, Safe, Accessible Tramadol 50 mg 1-2 tablets three times daily as needed - Appropriate, Effective, Safe, Accessible Ibuprofen 800 mg 1 tablet every 8 hours as needed - Appropriate, Effective, Query Safe, Accessible -Medications previously tried: none  -Counseled on limiting use of NSAIDs due to risk for kidney damage and relying on Tylenol.  Swelling (Goal: minimize swelling) -Controlled -Current treatment  Furosemide 40 mg 1 tablet daily as needed - Appropriate, Effective, Safe, Accessible -Medications previously tried: none  -Counseled on diet and exercise extensively Recommended to continue current medication  Health Maintenance -Vaccine gaps: COVID booster, Prevnar 20 -Current therapy:  BC headache powder (rarely uses) Multivitamin 1 tablet daily - not taking right now -Educated on Cost vs benefit of each product must be carefully weighed by individual consumer -Patient is satisfied with current therapy and denies issues -Recommended to continue current medication  Patient Goals/Self-Care Activities Patient will:  - take medications as prescribed as evidenced by patient report and record review check glucose weekly, document, and provide at future appointments check blood pressure weekly, document, and provide at future appointments  Follow Up Plan: The care management team will reach out to the patient again over the next 30 days.      Medication Assistance: None required.  Patient affirms current coverage meets needs.  Compliance/Adherence/Medication fill history: Care Gaps: Prevnar 20, DEXA, colonoscopy, foot exam Last PCP BP: 140/76 on 03/19/22 Last A1C: 7.2% (11/07/21)  Star-Rating Drugs: Atorvastatin (Lipitor) 40 mg - Last filled 04/04/22 90 DS at [facility]  Lisinopril - HCTZ 20-12.5  mg - Last filled 02/04/22 90 DS at [facility] Metformin (Glucophage) 500 mg - Last  filled 03/24/22 90 DS at [facility]  Patient's preferred pharmacy is:  [place] ([place]), [place] - [address] [place] [address] Phone: [phone] Fax: [place], [place] - [address], [place] [address], [place] [address] Phone: [phone] Fax: [phone]   Uses pill box? No - does not need Pt endorses 80% compliance - skips metformin sometimes  We discussed: Benefits of medication synchronization, packaging and delivery as well as enhanced pharmacist oversight with [employer]. Patient decided to: Continue current medication management strategy  Care Plan and Follow Up Patient Decision:  Patient agrees to Care Plan and Follow-up.  Plan: The care management team will reach out to the patient again over the next 30 days.  Jeni Salles, PharmD, [facility] [phone]

## 2022-04-20 ENCOUNTER — Other Ambulatory Visit: Payer: Self-pay | Admitting: Family Medicine

## 2022-04-28 ENCOUNTER — Telehealth: Payer: Self-pay

## 2022-04-28 NOTE — Telephone Encounter (Signed)
P.A. METFORMIN ER (OSM) '1000MG'$ , preferred is Glucophage XR

## 2022-04-30 DIAGNOSIS — E1159 Type 2 diabetes mellitus with other circulatory complications: Secondary | ICD-10-CM

## 2022-04-30 DIAGNOSIS — J45909 Unspecified asthma, uncomplicated: Secondary | ICD-10-CM

## 2022-04-30 DIAGNOSIS — E119 Type 2 diabetes mellitus without complications: Secondary | ICD-10-CM | POA: Diagnosis not present

## 2022-04-30 DIAGNOSIS — Z87891 Personal history of nicotine dependence: Secondary | ICD-10-CM | POA: Diagnosis not present

## 2022-04-30 DIAGNOSIS — I10 Essential (primary) hypertension: Secondary | ICD-10-CM

## 2022-04-30 DIAGNOSIS — R52 Pain, unspecified: Secondary | ICD-10-CM

## 2022-04-30 DIAGNOSIS — Z7984 Long term (current) use of oral hypoglycemic drugs: Secondary | ICD-10-CM

## 2022-04-30 DIAGNOSIS — I152 Hypertension secondary to endocrine disorders: Secondary | ICD-10-CM

## 2022-04-30 DIAGNOSIS — K219 Gastro-esophageal reflux disease without esophagitis: Secondary | ICD-10-CM

## 2022-05-08 ENCOUNTER — Other Ambulatory Visit: Payer: Self-pay | Admitting: Family Medicine

## 2022-05-08 DIAGNOSIS — F17200 Nicotine dependence, unspecified, uncomplicated: Secondary | ICD-10-CM

## 2022-05-08 DIAGNOSIS — I152 Hypertension secondary to endocrine disorders: Secondary | ICD-10-CM

## 2022-05-08 NOTE — Telephone Encounter (Signed)
[facility] is requesting to fill pt ibuprofen. Please advise KH 

## 2022-05-09 NOTE — Telephone Encounter (Signed)
P.A. approved til 11/30/22, left message for pt and pharmacy informed and went thru

## 2022-05-21 DIAGNOSIS — J45998 Other asthma: Secondary | ICD-10-CM | POA: Diagnosis not present

## 2022-05-27 ENCOUNTER — Telehealth: Payer: Self-pay | Admitting: Family Medicine

## 2022-05-27 NOTE — Telephone Encounter (Signed)
Left message for patient to call back and schedule Medicare Annual Wellness Visit (AWV) either virtually or in office. I left my number for patient to call [phone].  Last AWV ; 08/30/20 please schedule at anytime with health coach

## 2022-06-06 ENCOUNTER — Ambulatory Visit (INDEPENDENT_AMBULATORY_CARE_PROVIDER_SITE_OTHER): Payer: Medicare Other

## 2022-06-06 VITALS — Ht 67.0 in | Wt 214.0 lb

## 2022-06-06 DIAGNOSIS — Z0000 Encounter for general adult medical examination without abnormal findings: Secondary | ICD-10-CM | POA: Diagnosis not present

## 2022-06-06 NOTE — Patient Instructions (Signed)
Ms. [name] , Thank you for taking time to come for your Medicare Wellness Visit. I appreciate your ongoing commitment to your health goals. Please review the following plan we discussed and let me know if I can assist you in the future.   Screening recommendations/referrals: Colonoscopy: completed 07/01/2018, due 07/02/2023 Mammogram: patient tos schedule Bone Density: due Recommended yearly ophthalmology/optometry visit for glaucoma screening and checkup Recommended yearly dental visit for hygiene and checkup  Vaccinations: Influenza vaccine: decline Pneumococcal vaccine: complete 03/06/2017 Tdap vaccine: completed 08/13/2012, due 08/13/2022 Shingles vaccine: completed   Covid-19: 11/07/2021, 11/08/2020, 04/03/2020, 03/08/2020  Advanced directives: Advance directive discussed with you today.  Conditions/risks identified: none  Next appointment: Follow up in one year for your annual wellness visit    Preventive Care 65 Years and Older, Female Preventive care refers to lifestyle choices and visits with your health care provider that can promote health and wellness. What does preventive care include? A yearly physical exam. This is also called an annual well check. Dental exams once or twice a year. Routine eye exams. Ask your health care provider how often you should have your eyes checked. Personal lifestyle choices, including: Daily care of your teeth and gums. Regular physical activity. Eating a healthy diet. Avoiding tobacco and drug use. Limiting alcohol use. Practicing safe sex. Taking low-dose aspirin every day. Taking vitamin and mineral supplements as recommended by your health care provider. What happens during an annual well check? The services and screenings done by your health care provider during your annual well check will depend on your age, overall health, lifestyle risk factors, and family history of disease. Counseling  Your health care provider may ask you  questions about your: Alcohol use. Tobacco use. Drug use. Emotional well-being. Home and relationship well-being. Sexual activity. Eating habits. History of falls. Memory and ability to understand (cognition). Work and work Statistician. Reproductive health. Screening  You may have the following tests or measurements: Height, weight, and BMI. Blood pressure. Lipid and cholesterol levels. These may be checked every 5 years, or more frequently if you are over 71 years old. Skin check. Lung cancer screening. You may have this screening every year starting at age 55 if you have a 30-pack-year history of smoking and currently smoke or have quit within the past 15 years. Fecal occult blood test (FOBT) of the stool. You may have this test every year starting at age 39. Flexible sigmoidoscopy or colonoscopy. You may have a sigmoidoscopy every 5 years or a colonoscopy every 10 years starting at age 65. Hepatitis C blood test. Hepatitis B blood test. Sexually transmitted disease (STD) testing. Diabetes screening. This is done by checking your blood sugar (glucose) after you have not eaten for a while (fasting). You may have this done every 1-3 years. Bone density scan. This is done to screen for osteoporosis. You may have this done starting at age 64. Mammogram. This may be done every 1-2 years. Talk to your health care provider about how often you should have regular mammograms. Talk with your health care provider about your test results, treatment options, and if necessary, the need for more tests. Vaccines  Your health care provider may recommend certain vaccines, such as: Influenza vaccine. This is recommended every year. Tetanus, diphtheria, and acellular pertussis (Tdap, Td) vaccine. You may need a Td booster every 10 years. Zoster vaccine. You may need this after age 53. Pneumococcal 13-valent conjugate (PCV13) vaccine. One dose is recommended after age 64. Pneumococcal polysaccharide  (PPSV23)  vaccine. One dose is recommended after age 25. Talk to your health care provider about which screenings and vaccines you need and how often you need them. This information is not intended to replace advice given to you by your health care provider. Make sure you discuss any questions you have with your health care provider. Document Released: 12/14/2015 Document Revised: 08/06/2016 Document Reviewed: 09/18/2015 Elsevier Interactive Patient Education  2017 [employer] Prevention in the Home Falls can cause injuries. They can happen to people of all ages. There are many things you can do to make your home safe and to help prevent falls. What can I do on the outside of my home? Regularly fix the edges of walkways and driveways and fix any cracks. Remove anything that might make you trip as you walk through a door, such as a raised step or threshold. Trim any bushes or trees on the path to your home. Use bright outdoor lighting. Clear any walking paths of anything that might make someone trip, such as rocks or tools. Regularly check to see if handrails are loose or broken. Make sure that both sides of any steps have handrails. Any raised decks and porches should have guardrails on the edges. Have any leaves, snow, or ice cleared regularly. Use sand or salt on walking paths during winter. Clean up any spills in your garage right away. This includes oil or grease spills. What can I do in the bathroom? Use night lights. Install grab bars by the toilet and in the tub and shower. Do not use towel bars as grab bars. Use non-skid mats or decals in the tub or shower. If you need to sit down in the shower, use a plastic, non-slip stool. Keep the floor dry. Clean up any water that spills on the floor as soon as it happens. Remove soap buildup in the tub or shower regularly. Attach bath mats securely with double-sided non-slip rug tape. Do not have throw rugs and other things on the  floor that can make you trip. What can I do in the bedroom? Use night lights. Make sure that you have a light by your bed that is easy to reach. Do not use any sheets or blankets that are too big for your bed. They should not hang down onto the floor. Have a firm chair that has side arms. You can use this for support while you get dressed. Do not have throw rugs and other things on the floor that can make you trip. What can I do in the kitchen? Clean up any spills right away. Avoid walking on wet floors. Keep items that you use a lot in easy-to-reach places. If you need to reach something above you, use a strong step stool that has a grab bar. Keep electrical cords out of the way. Do not use floor polish or wax that makes floors slippery. If you must use wax, use non-skid floor wax. Do not have throw rugs and other things on the floor that can make you trip. What can I do with my stairs? Do not leave any items on the stairs. Make sure that there are handrails on both sides of the stairs and use them. Fix handrails that are broken or loose. Make sure that handrails are as long as the stairways. Check any carpeting to make sure that it is firmly attached to the stairs. Fix any carpet that is loose or worn. Avoid having throw rugs at the top or bottom of  the stairs. If you do have throw rugs, attach them to the floor with carpet tape. Make sure that you have a light switch at the top of the stairs and the bottom of the stairs. If you do not have them, ask someone to add them for you. What else can I do to help prevent falls? Wear shoes that: Do not have high heels. Have rubber bottoms. Are comfortable and fit you well. Are closed at the toe. Do not wear sandals. If you use a stepladder: Make sure that it is fully opened. Do not climb a closed stepladder. Make sure that both sides of the stepladder are locked into place. Ask someone to hold it for you, if possible. Clearly mark and make  sure that you can see: Any grab bars or handrails. First and last steps. Where the edge of each step is. Use tools that help you move around (mobility aids) if they are needed. These include: Canes. Walkers. Scooters. Crutches. Turn on the lights when you go into a dark area. Replace any light bulbs as soon as they burn out. Set up your furniture so you have a clear path. Avoid moving your furniture around. If any of your floors are uneven, fix them. If there are any pets around you, be aware of where they are. Review your medicines with your doctor. Some medicines can make you feel dizzy. This can increase your chance of falling. Ask your doctor what other things that you can do to help prevent falls. This information is not intended to replace advice given to you by your health care provider. Make sure you discuss any questions you have with your health care provider. Document Released: 09/13/2009 Document Revised: 04/24/2016 Document Reviewed: 12/22/2014 Elsevier Interactive Patient Education  2017 [employer].

## 2022-06-06 NOTE — Progress Notes (Signed)
I connected with [name] today by telephone and verified that I am speaking with the correct person using two identifiers. Location patient: home Location provider: work Persons participating in the virtual visit: Ashe Brinsley Wence, Glenna Durand LPN.   I discussed the limitations, risks, security and privacy concerns of performing an evaluation and management service by telephone and the availability of in person appointments. I also discussed with the patient that there may be a patient responsible charge related to this service. The patient expressed understanding and verbally consented to this telephonic visit.    Interactive audio and video telecommunications were attempted between this provider and patient, however failed, due to patient having technical difficulties OR patient did not have access to video capability.  We continued and completed visit with audio only.     Vital signs may be patient reported or missing.  Subjective:   [name] is a 66 y.o. female who presents for Medicare Annual (Subsequent) preventive examination.  Review of Systems     Cardiac Risk Factors include: advanced age (>5mn, >>56women);diabetes mellitus;hypertension;obesity (BMI >30kg/m2)     Objective:    Today's Vitals   06/06/22 1526  Weight: 214 lb (97.1 kg)  Height: 5' 7"  (1.702 m)   Body mass index is 33.52 kg/m.     06/06/2022    3:35 PM 05/27/2021    2:54 PM 08/30/2020    9:38 AM 06/25/2018    6:32 PM 05/25/2018   10:41 AM 04/13/2017    4:37 PM 01/23/2016    3:11 PM  Advanced Directives  Does Patient Have a Medical Advance Directive? No No No No No No No  Would patient like information on creating a medical advance directive?  Yes (MAU/Ambulatory/Procedural Areas - Information given) Yes (ED - Information included in AVS)  Yes (MAU/Ambulatory/Procedural Areas - Information given) No - Patient declined No - patient declined information    Current Medications  (verified) Outpatient Encounter Medications as of 06/06/2022  Medication Sig   Albuterol Sulfate (PROAIR RESPICLICK) 1295(90 Base) MCG/ACT AEPB Inhale 2 puffs into the lungs every 6 (six) hours as needed.   AquaLance Lancets 30G MISC CHECK BLOOD SUGAR 2 TIMES A DAY   atorvastatin (LIPITOR) 40 MG tablet Take 1 tablet by mouth once daily   Blood Glucose Monitoring Suppl (ACCU-CHEK AVIVA PLUS) w/Device KIT USE AS DIRECTED TO CHECK BLOOD GLUCOSE 1 TO 2 TIMES DAILY   furosemide (LASIX) 40 MG tablet Take 1 tablet (40 mg total) by mouth daily.   glucose blood (ACCU-CHEK AVIVA PLUS) test strip USE ONE STRIP TO CHECK GLUCOSE ONCE TO TWICE DAILY   ibuprofen (ADVIL) 800 MG tablet TAKE 1 TABLET BY MOUTH EVERY 8 HOURS AS NEEDED   lisinopril-hydrochlorothiazide (ZESTORETIC) 20-12.5 MG tablet Take 1 tablet by mouth once daily   metformin (FORTAMET) 1000 MG (OSM) 24 hr tablet Take 1 tablet (1,000 mg total) by mouth daily with breakfast.   methocarbamol (ROBAXIN) 500 MG tablet Take 1 tablet (500 mg total) by mouth 2 (two) times daily as needed.   montelukast (SINGULAIR) 10 MG tablet Take 1 tablet (10 mg total) by mouth at bedtime.   ondansetron (ZOFRAN-ODT) 8 MG disintegrating tablet Take 1 tablet (8 mg total) by mouth every 8 (eight) hours as needed for nausea or vomiting.   pantoprazole (PROTONIX) 40 MG tablet Take 1 tablet (40 mg total) by mouth daily.   TRELEGY ELLIPTA 200-62.5-25 MCG/ACT AEPB Inhale 1 puff by mouth once daily  Aspirin-Salicylamide-Caffeine (BC HEADACHE POWDER PO) Take 1 packet by mouth as needed. Reported on 01/09/2016 (Patient not taking: Reported on 06/06/2022)   Facility-Administered Encounter Medications as of 06/06/2022  Medication   0.9 %  sodium chloride infusion    Allergies (verified) Patient has no known allergies.   History: Past Medical History:  Diagnosis Date   Arthritis    Asthma    Colonic polyp    GERD (gastroesophageal reflux disease)    Hypertension    Obesity     Pain in limb    Smoker    Swelling of limb    Past Surgical History:  Procedure Laterality Date   BIOPSY BREAST Left 09/22/2018   no maglignancy   COLONOSCOPY  2009   Dr. Henrene Pastor   POLYPECTOMY     Family History  Problem Relation Age of Onset   Lung cancer Father    Cirrhosis Mother    Cirrhosis Brother    Colon cancer Neg Hx    Colon polyps Neg Hx    Esophageal cancer Neg Hx    Rectal cancer Neg Hx    Stomach cancer Neg Hx    Social History   Socioeconomic History   Marital status: Widowed    Spouse name: Not on file   Number of children: Not on file   Years of education: Not on file   Highest education level: Not on file  Occupational History   Not on file  Tobacco Use   Smoking status: Former    Packs/day: 0.25    Years: 10.00    Total pack years: 2.50    Types: Cigarettes   Smokeless tobacco: Never   Tobacco comments:    quit oct 2018  Vaping Use   Vaping Use: Never used  Substance and Sexual Activity   Alcohol use: Yes    Comment: socially   Drug use: Yes    Types: Marijuana    Comment: social   Sexual activity: Not Currently  Other Topics Concern   Not on file  Social History Narrative   Not on file   Social Determinants of Health   Financial Resource Strain: Low Risk  (06/06/2022)   Overall Financial Resource Strain (CARDIA)    Difficulty of Paying Living Expenses: Not hard at all  Food Insecurity: No Food Insecurity (06/06/2022)   Hunger Vital Sign    Worried About Running Out of Food in the Last Year: Never true    Ran Out of Food in the Last Year: Never true  Transportation Needs: No Transportation Needs (06/06/2022)   PRAPARE - Hydrologist (Medical): No    Lack of Transportation (Non-Medical): No  Physical Activity: Inactive (06/06/2022)   Exercise Vital Sign    Days of Exercise per Week: 0 days    Minutes of Exercise per Session: 0 min  Stress: No Stress Concern Present (06/06/2022)   [place]    Feeling of Stress : Not at all  Social Connections: Not on file    Tobacco Counseling Counseling given: Not Answered Tobacco comments: quit oct 2018   Clinical Intake:  Pre-visit preparation completed: Yes  Pain : No/denies pain     Nutritional Status: BMI > 30  Obese Nutritional Risks: None Diabetes: Yes  How often do you need to have someone help you when you read instructions, pamphlets, or other written materials from your doctor or pharmacy?: 1 - Never  Diabetic? Yes  Nutrition Risk Assessment:  Has the patient had any N/V/D within the last 2 months?  No  Does the patient have any non-healing wounds?  No  Has the patient had any unintentional weight loss or weight gain?  No   Diabetes:  Is the patient diabetic?  Yes  If diabetic, was a CBG obtained today?  No  Did the patient bring in their glucometer from home?  No  How often do you monitor your CBG's? Once or every two weeks.   Financial Strains and Diabetes Management:  Are you having any financial strains with the device, your supplies or your medication? No .  Does the patient want to be seen by Chronic Care Management for management of their diabetes?  No  Would the patient like to be referred to a Nutritionist or for Diabetic Management?  No   Diabetic Exams:  Diabetic Eye Exam: Overdue for diabetic eye exam. Pt has been advised about the importance in completing this exam. Patient advised to call and schedule an eye exam. Diabetic Foot Exam: Overdue, Pt has been advised about the importance in completing this exam. Pt is scheduled for diabetic foot exam on next appointment.   Interpreter Needed?: No  Information entered by :: NAllen LPN   Activities of Daily Living    06/06/2022    3:40 PM  In your present state of health, do you have any difficulty performing the following activities:  Hearing? 0  Vision? 0  Difficulty concentrating or  making decisions? 0  Walking or climbing stairs? 0  Dressing or bathing? 0  Doing errands, shopping? 0  Preparing Food and eating ? N  Using the Toilet? N  In the past six months, have you accidently leaked urine? N  Do you have problems with loss of bowel control? N  Managing your Medications? N  Managing your Finances? N  Housekeeping or managing your Housekeeping? N    Patient Care Team: [name], MD as PCP - General (Family Medicine) Viona Gilmore, [facility] as Pharmacist (Pharmacist) [name] as [place] any recent Medical Services you may have received from other than [facility] providers in the past year (date may be approximate).     Assessment:   This is a routine wellness examination for [name].  Hearing/Vision screen Vision Screening - Comments:: Regular eye exams, [facility]  Dietary issues and exercise activities discussed: Current Exercise Habits: The patient does not participate in regular exercise at present   Goals Addressed             This Visit's Progress    Patient Stated       06/06/2022, wants to be able to be pain free       Depression Screen    06/06/2022    3:38 PM 11/07/2021    2:55 PM 08/30/2020    9:41 AM 05/25/2018   10:06 AM 08/18/2017    3:16 PM 09/28/2013    1:57 PM  PHQ 2/9 Scores  PHQ - 2 Score 0 0 0 0 0 0  PHQ- 9 Score 4         Fall Risk    06/06/2022    3:37 PM 03/19/2022    4:26 PM 08/30/2020    9:40 AM 05/25/2018   10:06 AM 09/28/2013    1:57 PM  [place] in the past year? 0 0 0 No No  Number falls in past yr: 0 0  Injury with Fall? 0 0     Risk for fall due to : Medication side effect No Fall Risks No Fall Risks    Follow up Falls evaluation completed;Education provided;Falls prevention discussed Falls evaluation completed       FALL RISK PREVENTION PERTAINING TO THE HOME:  Any stairs in or around the home? Yes  If so, are there any without handrails? No   Home free of loose throw rugs in walkways, pet beds, electrical cords, etc? Yes  Adequate lighting in your home to reduce risk of falls? Yes   ASSISTIVE DEVICES UTILIZED TO PREVENT FALLS:  Life alert? No  Use of a cane, walker or w/c? Yes  Grab bars in the bathroom? Yes  Shower chair or bench in shower? Yes  Elevated toilet seat or a handicapped toilet? No   TIMED UP AND GO:  Was the test performed? No .      Cognitive Function:        06/06/2022    3:42 PM  6CIT Screen  What Year? 0 points  What month? 0 points  What time? 0 points  Count back from 20 0 points  Months in reverse 0 points  Repeat phrase 2 points  Total Score 2 points    Immunizations Immunization History  Administered Date(s) Administered   Hepatitis B 08/13/2012, 09/10/2012   Hepatitis B, adult 09/28/2013   Moderna Covid-19 Vaccine Bivalent Booster 55yr & up 11/07/2021   Moderna SARS-COV2 Booster Vaccination 11/08/2020   Moderna Sars-Covid-2 Vaccination 03/08/2020, 04/03/2020   PPD Test 08/08/2014, 09/10/2015, 09/01/2016   Pneumococcal Conjugate-13 03/06/2017   Pneumococcal Polysaccharide-23 08/13/2012   Tdap 08/13/2012   Zoster Recombinat (Shingrix) 01/12/2017, 07/18/2017   Zoster, Live 10/10/2014    TDAP status: Up to date  Flu Vaccine status: Declined, Education has been provided regarding the importance of this vaccine but patient still declined. Advised may receive this vaccine at local pharmacy or Health Dept. Aware to provide a copy of the vaccination record if obtained from local pharmacy or Health Dept. Verbalized acceptance and understanding.  Pneumococcal vaccine status: Up to date  Covid-19 vaccine status: Completed vaccines  Qualifies for Shingles Vaccine? Yes   Zostavax completed Yes   Shingrix Completed?: Yes  Screening Tests Health Maintenance  Topic Date Due   Pneumonia Vaccine 68 Years old (3 - PPSV23 if available, else PCV20) 06/12/2019   DEXA SCAN  Never done    COLONOSCOPY (Pts 45-432yrInsurance coverage will need to be confirmed)  07/01/2021   COVID-19 Vaccine (4 - Moderna series) 03/08/2022   FOOT EXAM  03/27/2022   HEMOGLOBIN A1C  05/08/2022   OPHTHALMOLOGY EXAM  05/16/2022   INFLUENZA VACCINE  07/01/2022   MAMMOGRAM  08/13/2022   TETANUS/TDAP  08/13/2022   Hepatitis C Screening  Completed   Zoster Vaccines- Shingrix  Completed   HPV VACCINES  Aged Out    Health Maintenance  Health Maintenance Due  Topic Date Due   Pneumonia Vaccine 6551Years old (3 57 PPSV23 if available, else PCV20) 06/12/2019   DEXA SCAN  Never done   COLONOSCOPY (Pts 45-4919yrnsurance coverage will need to be confirmed)  07/01/2021   COVID-19 Vaccine (4 - Moderna series) 03/08/2022   FOOT EXAM  03/27/2022   HEMOGLOBIN A1C  05/08/2022   OPHTHALMOLOGY EXAM  05/16/2022    Colorectal cancer screening: Type of screening: Colonoscopy. Completed 07/01/2018. Repeat every 5 years  Mammogram status: patient to schedule  Bone Density status: due  Lung Cancer  Screening: (Low Dose CT Chest recommended if Age 59-80 years, 30 pack-year currently smoking OR have quit w/in 15years.) does not qualify.   Lung Cancer Screening Referral: no  Additional Screening:  Hepatitis C Screening: does qualify; Completed 08/30/2020  Vision Screening: Recommended annual ophthalmology exams for early detection of glaucoma and other disorders of the eye. Is the patient up to date with their annual eye exam?  Yes  Who is the provider or what is the name of the office in which the patient attends annual eye exams? [facility] If pt is not established with a provider, would they like to be referred to a provider to establish care? No .   Dental Screening: Recommended annual dental exams for proper oral hygiene  Community Resource Referral / Chronic Care Management: CRR required this visit?  No   CCM required this visit?  No      Plan:     I have personally reviewed and noted the  following in the patient's chart:   Medical and social history Use of alcohol, tobacco or illicit drugs  Current medications and supplements including opioid prescriptions.  Functional ability and status Nutritional status Physical activity Advanced directives List of other physicians Hospitalizations, surgeries, and ER visits in previous 12 months Vitals Screenings to include cognitive, depression, and falls Referrals and appointments  In addition, I have reviewed and discussed with patient certain preventive protocols, quality metrics, and best practice recommendations. A written personalized care plan for preventive services as well as general preventive health recommendations were provided to patient.     Kellie Simmering, LPN   3/0/0923   Nurse Notes: none  Due to this being a virtual visit, the after visit summary with patients personalized plan was offered to patient via mail or my-chart. Patient would like to access on my-chart

## 2022-06-11 ENCOUNTER — Other Ambulatory Visit: Payer: Self-pay | Admitting: Family Medicine

## 2022-06-11 DIAGNOSIS — J4530 Mild persistent asthma, uncomplicated: Secondary | ICD-10-CM

## 2022-06-11 NOTE — Telephone Encounter (Signed)
Looks like albulerol proair was sent in on 4/18 instead

## 2022-06-12 ENCOUNTER — Other Ambulatory Visit: Payer: Self-pay | Admitting: Internal Medicine

## 2022-06-12 NOTE — Telephone Encounter (Signed)
Pt called and needs a refill on her albuterol aerosol spray (possiblity Ventiolin HFA). She said she is not on the proair Respiclick as it was powder and called back and was on albuterol aerosol. Is it ok to refill this.  I have discontinued the [employer]

## 2022-06-13 ENCOUNTER — Encounter: Payer: Self-pay | Admitting: Family Medicine

## 2022-06-13 ENCOUNTER — Ambulatory Visit (INDEPENDENT_AMBULATORY_CARE_PROVIDER_SITE_OTHER): Payer: Medicare Other | Admitting: Family Medicine

## 2022-06-13 VITALS — BP 134/70 | HR 66 | Temp 98.4°F | Wt 216.4 lb

## 2022-06-13 DIAGNOSIS — I152 Hypertension secondary to endocrine disorders: Secondary | ICD-10-CM

## 2022-06-13 DIAGNOSIS — E118 Type 2 diabetes mellitus with unspecified complications: Secondary | ICD-10-CM | POA: Diagnosis not present

## 2022-06-13 DIAGNOSIS — J4530 Mild persistent asthma, uncomplicated: Secondary | ICD-10-CM | POA: Diagnosis not present

## 2022-06-13 DIAGNOSIS — E1159 Type 2 diabetes mellitus with other circulatory complications: Secondary | ICD-10-CM

## 2022-06-13 LAB — POCT GLYCOSYLATED HEMOGLOBIN (HGB A1C): Hemoglobin A1C: 7 % — AB (ref 4.0–5.6)

## 2022-06-13 MED ORDER — ALBUTEROL SULFATE HFA 108 (90 BASE) MCG/ACT IN AERS: 2.0000 | INHALATION_SPRAY | Freq: Four times a day (QID) | RESPIRATORY_TRACT | 0 refills | Status: DC | PRN

## 2022-06-13 NOTE — Progress Notes (Signed)
   Subjective:    Patient ID: [name], female    DOB: 04-21-1956, 66 y.o.   MRN: [id number]  HPI She is here for evaluation of wheezing and asthma.  She apparently had an attack yesterday and today.  She states she is using Trelegy regularly but also is using the nebulizer twice per day and does use a rescue inhaler periodically as well.  No fever, chills, sore throat or earache.  She also has an underlying history of diabetes.  She is taking Fortamet.  Her exercise is quite minimal.  She does not smoke.   Review of Systems     Objective:   Physical Exam Alert and complaining of difficulty breathing.  Cardiac exam shows regular rhythm without murmurs gallops.  Lungs show expiratory wheezing.  Hemoglobin A1c is 7.0.     Assessment & Plan:  Type 2 diabetes mellitus with complications ([place]) - Plan: POCT glycosylated hemoglobin (Hb A1C)  Hypertension associated with diabetes (HCC)  Asthma, chronic, mild persistent, uncomplicated - Plan: albuterol (VENTOLIN HFA) 108 (90 Base) MCG/ACT inhaler, Ambulatory referral to Pulmonology I explained that I did not think she was under good control however she feels comfortable with her present dosing regimen.  I explained that she should not need the rescue inhaler more than twice per week.  I will renew her Ventolin and send her to pulmonary to get their help.  Continue with her present diabetes medication.

## 2022-06-13 NOTE — Telephone Encounter (Signed)
Pt was notified that she needs appt. Pt has an appt on Monday July 17th to discuss this

## 2022-06-16 ENCOUNTER — Ambulatory Visit: Payer: Medicare Other | Admitting: Family Medicine

## 2022-06-30 ENCOUNTER — Other Ambulatory Visit: Payer: Self-pay | Admitting: Family Medicine

## 2022-06-30 DIAGNOSIS — J45998 Other asthma: Secondary | ICD-10-CM | POA: Diagnosis not present

## 2022-06-30 DIAGNOSIS — J4530 Mild persistent asthma, uncomplicated: Secondary | ICD-10-CM

## 2022-07-09 ENCOUNTER — Ambulatory Visit (INDEPENDENT_AMBULATORY_CARE_PROVIDER_SITE_OTHER): Payer: Medicare Other | Admitting: Emergency Medicine

## 2022-07-09 ENCOUNTER — Encounter: Payer: Self-pay | Admitting: Emergency Medicine

## 2022-07-09 DIAGNOSIS — J4530 Mild persistent asthma, uncomplicated: Secondary | ICD-10-CM | POA: Diagnosis not present

## 2022-07-09 DIAGNOSIS — J301 Allergic rhinitis due to pollen: Secondary | ICD-10-CM

## 2022-07-09 DIAGNOSIS — K219 Gastro-esophageal reflux disease without esophagitis: Secondary | ICD-10-CM | POA: Diagnosis not present

## 2022-07-09 NOTE — Assessment & Plan Note (Signed)
History of chronic asthma, now with COPD.  Overall appears to be stable but does have fairly significant exertional limitation.  No flares.  Suspect her dyspnea is multifactorial with a significant contribution of deconditioning.  Upper airway irritation appears to be under fair control, minimal cough but she does have throat clearing.  I did talk to her about possibly changing her ACE inhibitor to an alternative.  Plan to continue her same regimen  Please continue your Trelegy 1 inhalation once daily.  Rinse and gargle after use. Continue to use your albuterol nebulizer 1-2 times daily as you have been doing Keep your albuterol inhaler available to use 2 puffs if needed for shortness of breath, chest tightness, wheezing. It may be beneficial to talk to Dr. Redmond School about changing your lisinopril-HCTZ to an alternative.  Sometimes this medication can cause throat irritation, contribute to throat clearing and coughing Follow Dr. Lamonte Sakai in December or sooner if you have any problems.

## 2022-07-09 NOTE — Progress Notes (Signed)
Subjective:    Patient ID: [name], female    DOB: 22-Jul-1956, 66 y.o.   MRN: [id number]  HPI  ROV 10/02/21 --follow-up visit 66 year old woman, former tobacco use.  Also with a history of obesity, diabetes, CKD, hypertension, GERD.  She had childhood asthma and has been treated for adult asthma when we met in August.  She returns today reporting that she is still experiencing some exertional SOB, has to rest. She uses albuterol with exertion. She is on Trelegy and believes it may help her - not sure. She does still clear her throat. She believes that she has GERD sx, happens at night.   Pulmonary function testing performed today and reviewed by me shows moderately severe obstruction without a bronchodilator response, some hyperinflation of her lung volumes based on RV, decreased diffusion capacity that corrects to the normal range when adjusted for alveolar volume.   ROV 07/09/2022 --66 year old woman with history of former tobacco use, moderately severe obstruction consistent with COPD.  She has a history of childhood asthma.  PMH: Obesity, diabetes, CKD, hypertension, GERD.  Last seen in November 2022.  At that time I started her on pantoprazole for some breakthrough GERD and chronic throat clearing.  She was also on an ACE inhibitor at that time, remains on Zestoretic Maintenance Trelegy, Singulair, uses albuterol nebs 1-2x a day. Uses HFA rarely during the day.  She is experiencing same exertional SOB, happens when she walks. She has to go slowly to do any activity - has to walk up 14 steps at home. Her voice is a bit hoarse. No flares or pred. Cough is stable to better. Still has some phlegm to clear.     Review of Systems As per HPI  Past Medical History:  Diagnosis Date   Arthritis    Asthma    Colonic polyp    GERD (gastroesophageal reflux disease)    Hypertension    Obesity    Pain in limb    Smoker    Swelling of limb      Family History  Problem Relation Age  of Onset   Lung cancer Father    Cirrhosis Mother    Cirrhosis Brother    Colon cancer Neg Hx    Colon polyps Neg Hx    Esophageal cancer Neg Hx    Rectal cancer Neg Hx    Stomach cancer Neg Hx      Social History   Socioeconomic History   Marital status: Widowed    Spouse name: Not on file   Number of children: Not on file   Years of education: Not on file   Highest education level: Not on file  Occupational History   Not on file  Tobacco Use   Smoking status: Former    Packs/day: 0.25    Years: 10.00    Total pack years: 2.50    Types: Cigarettes   Smokeless tobacco: Never   Tobacco comments:    quit oct 2018  Vaping Use   Vaping Use: Never used  Substance and Sexual Activity   Alcohol use: Yes    Comment: socially   Drug use: Yes    Types: Marijuana    Comment: social   Sexual activity: Not Currently  Other Topics Concern   Not on file  Social History Narrative   Not on file   Social Determinants of Health   Financial Resource Strain: Low Risk  (06/06/2022)   Overall Financial Resource Strain (CARDIA)  Difficulty of Paying Living Expenses: Not hard at all  Food Insecurity: No Food Insecurity (06/06/2022)   Hunger Vital Sign    Worried About Running Out of Food in the Last Year: Never true    Ran Out of Food in the Last Year: Never true  Transportation Needs: No Transportation Needs (06/06/2022)   PRAPARE - Hydrologist (Medical): No    Lack of Transportation (Non-Medical): No  Physical Activity: Inactive (06/06/2022)   Exercise Vital Sign    Days of Exercise per Week: 0 days    Minutes of Exercise per Session: 0 min  Stress: No Stress Concern Present (06/06/2022)   [place]    Feeling of Stress : Not at all  Social Connections: Not on file  Intimate Partner Violence: Not on file    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 8 g 0   AquaLance Lancets 30G MISC CHECK BLOOD SUGAR 2 TIMES A DAY 200 each 3   Aspirin-Salicylamide-Caffeine (BC HEADACHE POWDER PO) Take 1 packet by mouth as needed. Reported on 01/09/2016     atorvastatin (LIPITOR) 40 MG tablet Take 1 tablet by mouth once daily 90 tablet 1   Blood Glucose Monitoring Suppl (ACCU-CHEK AVIVA PLUS) w/Device KIT USE AS DIRECTED TO CHECK BLOOD GLUCOSE 1 TO 2 TIMES DAILY 1 kit 0   furosemide (LASIX) 40 MG tablet Take 1 tablet (40 mg total) by mouth daily. 30 tablet 0   glucose blood (ACCU-CHEK AVIVA PLUS) test strip USE ONE STRIP TO CHECK GLUCOSE ONCE TO TWICE DAILY 100 each 0   ibuprofen (ADVIL) 800 MG tablet TAKE 1 TABLET BY MOUTH EVERY 8 HOURS AS NEEDED 90 tablet 0   lisinopril-hydrochlorothiazide (ZESTORETIC) 20-12.5 MG tablet Take 1 tablet by mouth once daily 90 tablet 0   metformin (FORTAMET) 1000 MG (OSM) 24 hr tablet Take 1 tablet (1,000 mg total) by mouth daily with breakfast. 90 tablet 1   methocarbamol (ROBAXIN) 500 MG tablet Take 1 tablet (500 mg total) by mouth 2 (two) times daily as needed. 20 tablet 2   montelukast (SINGULAIR) 10 MG tablet TAKE 1 TABLET BY MOUTH AT BEDTIME 30 tablet 0   ondansetron (ZOFRAN-ODT) 8 MG disintegrating tablet Take 1 tablet (8 mg total) by mouth every 8 (eight) hours as needed for nausea or vomiting. 12 tablet 0   pantoprazole (PROTONIX) 40 MG tablet Take 1 tablet (40 mg total) by mouth daily. 30 tablet 11   TRELEGY ELLIPTA 200-62.5-25 MCG/ACT AEPB Inhale 1 puff by mouth once daily 60 each 5   Facility-Administered Medications Prior to Visit  Medication Dose Route Frequency Provider Last Rate Last Admin   0.9 %  sodium chloride infusion  500 mL Intravenous Once Irene Shipper, MD              Objective:   Physical Exam Vitals:   07/09/22 0908  BP: 130/82  Pulse: 67  Temp: 98 F (36.7 C)  TempSrc: Oral  SpO2: 91%  Weight: 220 lb 12.8 oz (100.2 kg)  Height: _0  (1.702 m)    Gen: Pleasant, obese woman, in no distress,  normal affect  ENT: No lesions,  mouth clear,  oropharynx clear, no postnasal drip, slight hoarse voice  Neck: No JVD, no stridor  Lungs: No use of accessory muscles, no crackles or wheezing on normal respiration, no wheeze on forced expiration  Cardiovascular: RRR, heart sounds normal, no murmur or gallops, no peripheral edema  Musculoskeletal: No deformities, no cyanosis or clubbing  Neuro: alert, awake, non focal  Skin: Warm, no lesions or rash       Assessment & Plan:   Asthma, chronic, mild persistent, uncomplicated History of chronic asthma, now with COPD.  Overall appears to be stable but does have fairly significant exertional limitation.  No flares.  Suspect her dyspnea is multifactorial with a significant contribution of deconditioning.  Upper airway irritation appears to be under fair control, minimal cough but she does have throat clearing.  I did talk to her about possibly changing her ACE inhibitor to an alternative.  Plan to continue her same regimen  Please continue your Trelegy 1 inhalation once daily.  Rinse and gargle after use. Continue to use your albuterol nebulizer 1-2 times daily as you have been doing Keep your albuterol inhaler available to use 2 puffs if needed for shortness of breath, chest tightness, wheezing. It may be beneficial to talk to Dr. Redmond School about changing your lisinopril-HCTZ to an alternative.  Sometimes this medication can cause throat irritation, contribute to throat clearing and coughing Follow Dr. Lamonte Sakai in December or sooner if you have any problems.  Chronic seasonal allergic rhinitis due to pollen Continue Singulair 10 mg each evening  Gastroesophageal reflux disease without esophagitis She  benefited from the addition of pantoprazole.  Plan to continue.  Continue your pantoprazole twice a day as you have been taking it   Baltazar Apo, MD, PhD 07/09/2022, 9:43 AM [facility] [phone] or if no answer before 7:00PM call [phone] For any issues after 7:00PM please call eLink [phone]

## 2022-07-09 NOTE — Assessment & Plan Note (Signed)
She benefited from the addition of pantoprazole.  Plan to continue.  Continue your pantoprazole twice a day as you have been taking it

## 2022-07-09 NOTE — Patient Instructions (Addendum)
Please continue your Trelegy 1 inhalation once daily.  Rinse and gargle after use. Continue to use your albuterol nebulizer 1-2 times daily as you have been doing Keep your albuterol inhaler available to use 2 puffs if needed for shortness of breath, chest tightness, wheezing. Continue Singulair 10 mg each evening Continue your pantoprazole twice a day as you have been taking it It may be beneficial to talk to Dr. Redmond School about changing your lisinopril-HCTZ to an alternative.  Sometimes this medication can cause throat irritation, contribute to throat clearing and coughing Follow Dr. Lamonte Sakai in December or sooner if you have any problems.

## 2022-07-09 NOTE — Assessment & Plan Note (Signed)
Continue Singulair 10 mg each evening

## 2022-07-30 ENCOUNTER — Other Ambulatory Visit: Payer: Self-pay | Admitting: Family Medicine

## 2022-07-30 DIAGNOSIS — I152 Hypertension secondary to endocrine disorders: Secondary | ICD-10-CM

## 2022-07-30 DIAGNOSIS — F17200 Nicotine dependence, unspecified, uncomplicated: Secondary | ICD-10-CM

## 2022-07-30 NOTE — Telephone Encounter (Signed)
[facility] is requesting to fill pt ibuprofen. Please advise [facility]

## 2022-08-05 DIAGNOSIS — E119 Type 2 diabetes mellitus without complications: Secondary | ICD-10-CM | POA: Diagnosis not present

## 2022-08-05 LAB — HM DIABETES EYE EXAM

## 2022-08-06 ENCOUNTER — Encounter: Payer: Self-pay | Admitting: Internal Medicine

## 2022-08-13 ENCOUNTER — Encounter: Payer: Self-pay | Admitting: Family Medicine

## 2022-08-14 ENCOUNTER — Ambulatory Visit: Payer: Medicare Other | Admitting: Family Medicine

## 2022-09-02 ENCOUNTER — Telehealth: Payer: Self-pay | Admitting: Pharmacist

## 2022-09-02 NOTE — Chronic Care Management (AMB) (Signed)
    Chronic Care Management Pharmacy Assistant   Name: [name]  MRN: [id number] DOB: Sep 12, 1956  Reason for Encounter: Medication Adh   Recent office visits:  06/13/22 [name], MD - Patient presented for Type 2 diabetes mellitus with complications and other concerns. Prescribed Albuterol Sulfate.  06/06/22 Kellie Simmering, LPN - Patient presented via video for Medicare Annual wellness exam. No medication changes.  Recent consult visits:  07/09/22 Collene Gobble, MD (Pulmonology) - Patient presented for Asthma chronic mild persistent uncomplicated and other concerns. No medication changes.  05/21/22 Claims encounter for other asthma. No other visit details available  Hospital visits:  None in previous 6 months  Medications: Outpatient Encounter Medications as of 09/02/2022  Medication Sig Note   albuterol (VENTOLIN HFA) 108 (90 Base) MCG/ACT inhaler Inhale 2 puffs into the lungs every 6 (six) hours as needed for wheezing or shortness of breath.    AquaLance Lancets 30G MISC CHECK BLOOD SUGAR 2 TIMES A DAY    Aspirin-Salicylamide-Caffeine (BC HEADACHE POWDER PO) Take 1 packet by mouth as needed. Reported on 01/09/2016 11/29/2012: Prn    atorvastatin (LIPITOR) 40 MG tablet Take 1 tablet by mouth once daily    Blood Glucose Monitoring Suppl (ACCU-CHEK AVIVA PLUS) w/Device KIT USE AS DIRECTED TO CHECK BLOOD GLUCOSE 1 TO 2 TIMES DAILY    furosemide (LASIX) 40 MG tablet Take 1 tablet (40 mg total) by mouth daily.    glucose blood (ACCU-CHEK AVIVA PLUS) test strip USE ONE STRIP TO CHECK GLUCOSE ONCE TO TWICE DAILY    ibuprofen (ADVIL) 800 MG tablet TAKE 1 TABLET BY MOUTH EVERY 8 HOURS AS NEEDED    lisinopril-hydrochlorothiazide (ZESTORETIC) 20-12.5 MG tablet Take 1 tablet by mouth once daily    metformin (FORTAMET) 1000 MG (OSM) 24 hr tablet Take 1 tablet (1,000 mg total) by mouth daily with breakfast.    methocarbamol (ROBAXIN) 500 MG tablet Take 1 tablet (500 mg total) by  mouth 2 (two) times daily as needed.    montelukast (SINGULAIR) 10 MG tablet TAKE 1 TABLET BY MOUTH AT BEDTIME    ondansetron (ZOFRAN-ODT) 8 MG disintegrating tablet Take 1 tablet (8 mg total) by mouth every 8 (eight) hours as needed for nausea or vomiting.    pantoprazole (PROTONIX) 40 MG tablet Take 1 tablet (40 mg total) by mouth daily.    TRELEGY ELLIPTA 200-62.5-25 MCG/ACT AEPB Inhale 1 puff by mouth once daily    Facility-Administered Encounter Medications as of 09/02/2022  Medication   0.9 %  sodium chloride infusion   Notes: Call to patient to discuss medication adherence for Atorvastatin that was last filled on 04/04/22 for 90 DS. Did not reach patient left voicemail with my contact information for return call. Plan for future attempt to follow up with patient for DM assessment.  Care Gaps: PNA Vaccine - Overdue DEXA - Overdue Diabetic Kidney Eval (Urine)- Overdue Colonoscopy - Overdue COVID Booster - Overdue Foot Exam - Overdue Flu Vaccine - Overdue Mammogram - Overdue TDAP - Overdue CCM-Need AWV- Scheduled for 09/12/22 Lab Results  Component Value Date   HGBA1C 7.0 (A) 06/13/2022    Star Rating Drugs: Atorvastatin 40 mg - Last filled 04/04/22 [facility] DS at [facility] (Verified) Lisinopril-HCTZ 20-12.5 mg - Last filled 08/01/22 [facility] DS at [facility] (Verified) Metformin 1000 mg - Last filled 08/07/22 90 DS at [facility] Pharmacist Assistant [phone]

## 2022-09-05 NOTE — Chronic Care Management (AMB) (Signed)
Patient returned call reports she is not taking this medication as it has caused cramping in her legs, she is to follow up with PCP on 09/17/22 in office will forward this information along so it may be discussed during her appointment.    [place] Clinical Pharmacist Assistant [phone]

## 2022-09-06 ENCOUNTER — Other Ambulatory Visit: Payer: Self-pay | Admitting: Family Medicine

## 2022-09-08 NOTE — Telephone Encounter (Signed)
[facility] is requesting to fill pt trelegy. Please advise [facility]

## 2022-09-09 ENCOUNTER — Other Ambulatory Visit: Payer: Self-pay | Admitting: Family Medicine

## 2022-09-09 ENCOUNTER — Encounter: Payer: Self-pay | Admitting: Internal Medicine

## 2022-09-09 DIAGNOSIS — J4530 Mild persistent asthma, uncomplicated: Secondary | ICD-10-CM

## 2022-09-09 NOTE — Telephone Encounter (Signed)
[facility] is requesting to fill pt albuterol inhaler. Please advise [facility]

## 2022-09-16 DIAGNOSIS — J45998 Other asthma: Secondary | ICD-10-CM | POA: Diagnosis not present

## 2022-09-17 ENCOUNTER — Encounter: Payer: Self-pay | Admitting: Family Medicine

## 2022-09-17 ENCOUNTER — Ambulatory Visit (INDEPENDENT_AMBULATORY_CARE_PROVIDER_SITE_OTHER): Payer: Medicare Other | Admitting: Family Medicine

## 2022-09-17 VITALS — BP 124/72 | HR 68 | Temp 98.5°F | Ht 65.0 in | Wt 216.6 lb

## 2022-09-17 DIAGNOSIS — E785 Hyperlipidemia, unspecified: Secondary | ICD-10-CM

## 2022-09-17 DIAGNOSIS — N1831 Chronic kidney disease, stage 3a: Secondary | ICD-10-CM

## 2022-09-17 DIAGNOSIS — Z86010 Personal history of colonic polyps: Secondary | ICD-10-CM

## 2022-09-17 DIAGNOSIS — J301 Allergic rhinitis due to pollen: Secondary | ICD-10-CM | POA: Diagnosis not present

## 2022-09-17 DIAGNOSIS — K219 Gastro-esophageal reflux disease without esophagitis: Secondary | ICD-10-CM | POA: Diagnosis not present

## 2022-09-17 DIAGNOSIS — I152 Hypertension secondary to endocrine disorders: Secondary | ICD-10-CM

## 2022-09-17 DIAGNOSIS — E1169 Type 2 diabetes mellitus with other specified complication: Secondary | ICD-10-CM | POA: Diagnosis not present

## 2022-09-17 DIAGNOSIS — R6889 Other general symptoms and signs: Secondary | ICD-10-CM | POA: Diagnosis not present

## 2022-09-17 DIAGNOSIS — E1159 Type 2 diabetes mellitus with other circulatory complications: Secondary | ICD-10-CM

## 2022-09-17 DIAGNOSIS — H259 Unspecified age-related cataract: Secondary | ICD-10-CM | POA: Diagnosis not present

## 2022-09-17 DIAGNOSIS — E118 Type 2 diabetes mellitus with unspecified complications: Secondary | ICD-10-CM

## 2022-09-17 DIAGNOSIS — J4530 Mild persistent asthma, uncomplicated: Secondary | ICD-10-CM

## 2022-09-17 DIAGNOSIS — Z0000 Encounter for general adult medical examination without abnormal findings: Secondary | ICD-10-CM

## 2022-09-17 DIAGNOSIS — E669 Obesity, unspecified: Secondary | ICD-10-CM

## 2022-09-17 DIAGNOSIS — M545 Low back pain, unspecified: Secondary | ICD-10-CM

## 2022-09-17 DIAGNOSIS — R609 Edema, unspecified: Secondary | ICD-10-CM

## 2022-09-17 DIAGNOSIS — G8929 Other chronic pain: Secondary | ICD-10-CM

## 2022-09-17 DIAGNOSIS — E2839 Other primary ovarian failure: Secondary | ICD-10-CM

## 2022-09-17 DIAGNOSIS — D126 Benign neoplasm of colon, unspecified: Secondary | ICD-10-CM

## 2022-09-17 DIAGNOSIS — Z789 Other specified health status: Secondary | ICD-10-CM

## 2022-09-17 LAB — POCT GLYCOSYLATED HEMOGLOBIN (HGB A1C): Hemoglobin A1C: 6.8 % — AB (ref 4.0–5.6)

## 2022-09-17 LAB — POCT UA - MICROALBUMIN
Albumin/Creatinine Ratio, Urine, POC: 72.9
Creatinine, POC: 174.2 mg/dL
Microalbumin Ur, POC: 41.8 mg/L

## 2022-09-17 MED ORDER — LOSARTAN POTASSIUM-HCTZ 50-12.5 MG PO TABS: 1.0000 | ORAL_TABLET | Freq: Every day | ORAL | 3 refills | Status: DC

## 2022-09-17 MED ORDER — MONTELUKAST SODIUM 10 MG PO TABS: 10.0000 mg | ORAL_TABLET | Freq: Every day | ORAL | 0 refills | Status: DC

## 2022-09-17 MED ORDER — PANTOPRAZOLE SODIUM 40 MG PO TBEC: 40.0000 mg | DELAYED_RELEASE_TABLET | Freq: Every day | ORAL | 1 refills | Status: DC

## 2022-09-17 NOTE — Patient Instructions (Signed)
Stop the lisinopril

## 2022-09-17 NOTE — Progress Notes (Signed)
[name] is a 66 y.o. female who presents for annual wellness visit and follow-up on chronic medical conditions.  She states that Lipitor has caused muscle cramps and she is no longer taking that.  She continues on her meds for her asthma with Trelegy as well as Proventil nebulized and inhaler usually once or twice per day.  She has been evaluated by pulmonology for this.  That note was reviewed.  She has colonic polyps and is scheduled for follow-up colonoscopy in 2024.  She has cataracts and apparently is getting ready to have surgery on that.  She continues on lisinopril/HCTZ.  She does have diabetes and is doing well on metformin.  Her allergies seem to be under good control.  She continues to have chronic low back pain.  She has a history of dependent edema but presently is not on a diuretic.  Immunizations and Health Maintenance Immunization History  Administered Date(s) Administered   Hepatitis B 08/13/2012, 09/10/2012   Hepatitis B, adult 09/28/2013   Moderna Covid-19 Vaccine Bivalent Booster 65yr & up 11/07/2021   Moderna SARS-COV2 Booster Vaccination 11/08/2020   Moderna Sars-Covid-2 Vaccination 03/08/2020, 04/03/2020   PPD Test 08/08/2014, 09/10/2015, 09/01/2016   Pneumococcal Conjugate-13 03/06/2017   Pneumococcal Polysaccharide-23 08/13/2012   Tdap 08/13/2012   Zoster Recombinat (Shingrix) 01/12/2017, 07/18/2017   Zoster, Live 10/10/2014   Health Maintenance Due  Topic Date Due   Pneumonia Vaccine 66 Years old (320- PPSV23 or PCV20) 06/12/2019   DEXA SCAN  Never done   Diabetic kidney evaluation - Urine ACR  02/26/2021   COLONOSCOPY (Pts 45-466yrInsurance coverage will need to be confirmed)  07/01/2021   Diabetic kidney evaluation - GFR measurement  01/22/2022   COVID-19 Vaccine (4 - Moderna series) 03/08/2022   FOOT EXAM  03/27/2022   INFLUENZA VACCINE  Never done   MAMMOGRAM  08/13/2022   TETANUS/TDAP  08/13/2022    Last Pap smear:doesn't remember having  one Last mammogram: 2022 Last colonoscopy 2019 Last DEXA: never had one  Dentist: yearly Ophtho: yearly Exercise: walks up and down her stairs  Other doctors caring for patient include: Dr. ByLamonte SakaiAdvanced directives: Information given    Depression screen:  See questionnaire below.     06/06/2022    3:38 PM 11/07/2021    2:55 PM 08/30/2020    9:41 AM 05/25/2018   10:06 AM 08/18/2017    3:16 PM  Depression screen PHQ 2/9  Decreased Interest 0 0 0 0 0  Down, Depressed, Hopeless 0 0 0 0 0  PHQ - 2 Score 0 0 0 0 0  Altered sleeping 3      Tired, decreased energy 1      Change in appetite 0      Feeling bad or failure about yourself  0      Trouble concentrating 0      Moving slowly or fidgety/restless 0      Suicidal thoughts 0      PHQ-9 Score 4      Difficult doing work/chores Somewhat difficult        Fall Risk Screen: see questionnaire below.    06/06/2022    3:37 PM 03/19/2022    4:26 PM 08/30/2020    9:40 AM 05/25/2018   10:06 AM 09/28/2013    1:57 PM  Fall Risk   Falls in the past year? 0 0 0 No No  Number falls in past yr: 0 0     Injury  with Fall? 0 0     Risk for fall due to : Medication side effect No Fall Risks No Fall Risks    Follow up Falls evaluation completed;Education provided;Falls prevention discussed Falls evaluation completed       ADL screen:  See questionnaire below Functional Status Survey:     Review of Systems Constitutional: -, -unexpected weight change, -anorexia, -fatigue Allergy: -sneezing, -itching, -congestion Dermatology: denies changing moles, rash, lumps ENT: -runny nose, -ear pain, -sore throat,  Cardiology:  -chest pain, -palpitations, -orthopnea, Respiratory: -cough, -shortness of breath, -dyspnea on exertion, -wheezing,  Gastroenterology: -abdominal pain, -nausea, -vomiting, -diarrhea, -constipation, -dysphagia Hematology: -bleeding or bruising problems Musculoskeletal: -arthralgias, -myalgias, -joint swelling, -back pain,  - Ophthalmology: -vision changes,  Urology: -dysuria, -difficulty urinating,  -urinary frequency, -urgency, incontinence Neurology: -, -numbness, , -memory loss, -falls, -dizziness    PHYSICAL EXAM:    General Appearance: Alert, cooperative, no distress, appears stated age Head: Normocephalic, without obvious abnormality, atraumatic Eyes: PERRL, conjunctiva/corneas clear, EOM's intact,  Ears: Normal TM's and external ear canals Nose: Nares normal, mucosa normal, no drainage or sinus tenderness Throat: Lips, mucosa, and tongue normal; teeth and gums normal Neck: Supple, no lymphadenopathy;  thyroid:  no enlargement/tenderness/nodules; no carotid bruit or JVD Lungs: Clear to auscultation bilaterally without wheezes, rales or ronchi; respirations unlabored Heart: Regular rate and rhythm, S1 and S2 normal, no murmur, rubor gallop Abdomen: Soft, non-tender, nondistended, normoactive bowel sounds,  no masses, no hepatosplenomegaly Extremities: No clubbing, cyanosis or edema Pulses: 2+ and symmetric all extremities Skin:  Skin color, texture, turgor normal, no rashes or lesions Lymph nodes: Cervical, supraclavicular, and axillary nodes normal Neurologic:  CNII-XII intact, normal strength, sensation and gait; reflexes 2+ and symmetric throughout Psych: Normal mood, affect, hygiene and grooming. Hemoglobin A1c is 6.8 ASSESSMENT/PLAN: Hypertension associated with diabetes ([place]) - Plan: CBC with Differential/Platelet, Comprehensive metabolic panel, losartan-hydrochlorothiazide (HYZAAR) 50-12.5 MG tablet  Asthma, chronic, mild persistent, uncomplicated - Plan: montelukast (SINGULAIR) 10 MG tablet  Chronic seasonal allergic rhinitis due to pollen  Adenomatous polyp of colon, unspecified part of colon  Gastroesophageal reflux disease without esophagitis - Plan: pantoprazole (PROTONIX) 40 MG tablet  Type 2 diabetes mellitus with complications ([place]) - Plan: CBC with Differential/Platelet,  Comprehensive metabolic panel, POCT UA - Microalbumin, POCT glycosylated hemoglobin (Hb A1C)  Stage 3a chronic kidney disease (HCC)  Senile cataract, unspecified age-related cataract type, unspecified laterality  Chronic low back pain, unspecified back pain laterality, unspecified whether sciatica present  History of colonic polyps  Obesity (BMI 30-39.9)  Multiple somatic complaints  Dependent edema  Hyperlipidemia associated with type 2 diabetes mellitus ([place]) - Plan: Lipid panel  Statin intolerance  Estrogen deficiency - Plan: DG Bone Density I congratulated her on the work she is doing for her diabetes.  I will refer her to from [facility] to see if she will qualify for a lipid study and if not I will try different statin to see if she can tolerate that.  Follow-up with GI in 2024.  She will also see ophthalmology for the cataracts.   Discussed at least 30 minutes of aerobic activity at least 5 days/week and weight-bearing exercise 2x/week;  healthy diet, including goals of calcium and vitamin D intake   Immunization recommendations discussed.  Colonoscopy recommendations reviewed   Medicare Attestation I have personally reviewed: The patient's medical and social history Their use of alcohol, tobacco or illicit drugs Their current medications and supplements The patient's functional ability including ADLs,fall risks, home safety risks, cognitive,  and hearing and visual impairment Diet and physical activities Evidence for depression or mood disorders  The patient's weight, height, and BMI have been recorded in the chart.  I have made referrals, counseling, and provided education to the patient based on review of the above and I have provided the patient with a written personalized care plan for preventive services.     Jill Alexanders, MD   09/17/2022

## 2022-09-18 LAB — COMPREHENSIVE METABOLIC PANEL
ALT: 18 IU/L (ref 0–32)
AST: 19 IU/L (ref 0–40)
Albumin/Globulin Ratio: 1.4 (ref 1.2–2.2)
Albumin: 4.5 g/dL (ref 3.9–4.9)
Alkaline Phosphatase: 80 IU/L (ref 44–121)
BUN/Creatinine Ratio: 15 (ref 12–28)
BUN: 16 mg/dL (ref 8–27)
Bilirubin Total: 0.3 mg/dL (ref 0.0–1.2)
CO2: 25 mmol/L (ref 20–29)
Calcium: 10.1 mg/dL (ref 8.7–10.3)
Chloride: 102 mmol/L (ref 96–106)
Creatinine, Ser: 1.1 mg/dL — ABNORMAL HIGH (ref 0.57–1.00)
Globulin, Total: 3.3 g/dL (ref 1.5–4.5)
Glucose: 108 mg/dL — ABNORMAL HIGH (ref 70–99)
Potassium: 4.4 mmol/L (ref 3.5–5.2)
Sodium: 144 mmol/L (ref 134–144)
Total Protein: 7.8 g/dL (ref 6.0–8.5)
eGFR: 55 mL/min/{1.73_m2} — ABNORMAL LOW (ref >59)

## 2022-09-18 LAB — CBC WITH DIFFERENTIAL/PLATELET
Basophils Absolute: 0.1 10*3/uL (ref 0.0–0.2)
Basos: 1 %
EOS (ABSOLUTE): 0.3 10*3/uL (ref 0.0–0.4)
Eos: 3 %
Hematocrit: 36.6 % (ref 34.0–46.6)
Hemoglobin: 11.9 g/dL (ref 11.1–15.9)
Immature Grans (Abs): 0 10*3/uL (ref 0.0–0.1)
Immature Granulocytes: 0 %
Lymphocytes Absolute: 2.2 10*3/uL (ref 0.7–3.1)
Lymphs: 26 %
MCH: 29.3 pg (ref 26.6–33.0)
MCHC: 32.5 g/dL (ref 31.5–35.7)
MCV: 90 fL (ref 79–97)
Monocytes Absolute: 0.8 10*3/uL (ref 0.1–0.9)
Monocytes: 9 %
Neutrophils Absolute: 5 10*3/uL (ref 1.4–7.0)
Neutrophils: 61 %
Platelets: 408 10*3/uL (ref 150–450)
RBC: 4.06 x10E6/uL (ref 3.77–5.28)
RDW: 12.3 % (ref 11.7–15.4)
WBC: 8.4 10*3/uL (ref 3.4–10.8)

## 2022-09-18 LAB — LIPID PANEL
Chol/HDL Ratio: 3.2 ratio (ref 0.0–4.4)
Cholesterol, Total: 161 mg/dL (ref 100–199)
HDL: 51 mg/dL (ref >39)
LDL Chol Calc (NIH): 95 mg/dL (ref 0–99)
Triglycerides: 79 mg/dL (ref 0–149)
VLDL Cholesterol Cal: 15 mg/dL (ref 5–40)

## 2022-09-22 ENCOUNTER — Encounter: Payer: Self-pay | Admitting: Internal Medicine

## 2022-10-02 ENCOUNTER — Other Ambulatory Visit: Payer: Self-pay | Admitting: Family Medicine

## 2022-10-10 ENCOUNTER — Telehealth: Payer: Self-pay | Admitting: Pharmacist

## 2022-10-10 NOTE — Chronic Care Management (AMB) (Signed)
Chronic Care Management Pharmacy Assistant   Name: [name]  MRN: [id number] DOB: 12-Mar-1956  Reason for Encounter: Disease State   Conditions to be addressed/monitored: DMII  Recent office visits:  09/17/22 [name], MD  - Patient presented for Hypertension associated with diabetes and other concerns. Prescribed Losartan. Stopped Atorvastatin. Stopped Furosemide. Stopped Lisinopril- HCTZ  Recent consult visits:  None  Hospital visits:  None in previous 6 months  Medications: Outpatient Encounter Medications as of 10/10/2022  Medication Sig Note   albuterol (VENTOLIN HFA) 108 (90 Base) MCG/ACT inhaler INHALE 2 PUFFS BY MOUTH EVERY 6 HOURS AS NEEDED FOR WHEEZING FOR SHORTNESS OF BREATH    AquaLance Lancets 30G MISC CHECK BLOOD SUGAR 2 TIMES A DAY    Aspirin-Salicylamide-Caffeine (BC HEADACHE POWDER PO) Take 1 packet by mouth as needed. Reported on 01/09/2016 11/29/2012: Prn    Blood Glucose Monitoring Suppl (ACCU-CHEK AVIVA PLUS) w/Device KIT USE AS DIRECTED TO CHECK BLOOD GLUCOSE 1 TO 2 TIMES DAILY    glucose blood (ACCU-CHEK AVIVA PLUS) test strip USE ONE STRIP TO CHECK GLUCOSE ONCE TO TWICE DAILY    ibuprofen (ADVIL) 800 MG tablet TAKE 1 TABLET BY MOUTH EVERY 8 HOURS AS NEEDED    losartan-hydrochlorothiazide (HYZAAR) 50-12.5 MG tablet Take 1 tablet by mouth daily.    metformin (FORTAMET) 1000 MG (OSM) 24 hr tablet Take 1 tablet (1,000 mg total) by mouth daily with breakfast.    methocarbamol (ROBAXIN) 500 MG tablet Take 1 tablet (500 mg total) by mouth 2 (two) times daily as needed. (Patient not taking: Reported on 09/17/2022)    montelukast (SINGULAIR) 10 MG tablet Take 1 tablet (10 mg total) by mouth at bedtime.    ondansetron (ZOFRAN-ODT) 8 MG disintegrating tablet Take 1 tablet (8 mg total) by mouth every 8 (eight) hours as needed for nausea or vomiting.    pantoprazole (PROTONIX) 40 MG tablet Take 1 tablet (40 mg total) by mouth daily.    TRELEGY ELLIPTA  200-62.5-25 MCG/ACT AEPB INHALE 1 PUFF ONCE DAILY    Facility-Administered Encounter Medications as of 10/10/2022  Medication   0.9 %  sodium chloride infusion   Recent Relevant Labs: Lab Results  Component Value Date/Time   HGBA1C 6.8 (A) 09/17/2022 03:54 PM   HGBA1C 7.0 (A) 06/13/2022 04:36 PM   HGBA1C 5.4 06/09/2019 12:00 AM   HGBA1C 7.7 05/12/2019 12:00 AM   MICROALBUR 41.8 09/17/2022 03:55 PM   MICROALBUR 12.0 02/27/2020 04:27 PM    Kidney Function Lab Results  Component Value Date/Time   CREATININE 1.10 (H) 09/17/2022 03:29 PM   CREATININE 1.27 (H) 01/22/2021 01:50 PM   CREATININE 1.11 (H) 03/06/2017 03:48 PM   CREATININE 0.99 10/16/2015 12:01 AM   GFRNONAA 44 (L) 01/22/2021 01:50 PM   GFRAA 51 (L) 01/22/2021 01:50 PM    Current antihyperglycemic regimen:  Metformin 1000 mg 1 tablet daily  What recent interventions/DTPs have been made to improve glycemic control:  Dose of Metformin was recently increased to 1000 mg daily Have there been any recent hospitalizations or ED visits since last visit with CPP? No Patient denies hypoglycemic symptoms, including None Patient denies hyperglycemic symptoms, including none How often are you checking your blood sugar? Patient did not have glucose readings to report. She states she has recently had an A1c done and was within her limits. She reports she is tolerating the change of the metformin from twice to once a day just fine and is awaiting to hear from  someone on a statin replacement per her PCP setting up for her. She further states she has a Bone Density test scheduled for December.  Adherence Review: Is the patient currently on a STATIN medication? No Is the patient currently on ACE/ARB medication? Yes Does the patient have >5 day gap between last estimated fill dates? No    Care Gaps: PNA Vaccine - Overdue Dexa - Overdue Colonoscopy - Overdue COVID Booster - Overdue Foot Exam - Overdue Flu Vaccine - Overdue Mammogram  - Overdue TDAP - Overdue CCM- 1/24 BP- 124/72 09/17/22 AWV- 06/06/22 Lab Results  Component Value Date   HGBA1C 6.8 (A) 09/17/2022    Star Rating Drugs: Losartan-HCTZ 50-12.5 mg - Last filled 09/17/22 90 DS at [facility] Metformin 1000 mg - Last filled 08/07/22 90 DS at [facility] Pharmacist Assistant [phone]

## 2022-10-16 DIAGNOSIS — J45998 Other asthma: Secondary | ICD-10-CM | POA: Diagnosis not present

## 2022-10-23 ENCOUNTER — Emergency Department (HOSPITAL_COMMUNITY)
Admission: EM | Admit: 2022-10-23 | Discharge: 2022-10-23 | Disposition: A | Discharge status: Home / Self Care | Payer: Medicare Other | Attending: Emergency Medicine | Admitting: Emergency Medicine

## 2022-10-23 ENCOUNTER — Other Ambulatory Visit: Payer: Self-pay

## 2022-10-23 DIAGNOSIS — E119 Type 2 diabetes mellitus without complications: Secondary | ICD-10-CM | POA: Diagnosis not present

## 2022-10-23 DIAGNOSIS — Z7982 Long term (current) use of aspirin: Secondary | ICD-10-CM | POA: Insufficient documentation

## 2022-10-23 DIAGNOSIS — Z743 Need for continuous supervision: Secondary | ICD-10-CM | POA: Diagnosis not present

## 2022-10-23 DIAGNOSIS — I10 Essential (primary) hypertension: Secondary | ICD-10-CM | POA: Diagnosis not present

## 2022-10-23 DIAGNOSIS — R109 Unspecified abdominal pain: Secondary | ICD-10-CM | POA: Diagnosis not present

## 2022-10-23 DIAGNOSIS — K529 Noninfective gastroenteritis and colitis, unspecified: Secondary | ICD-10-CM

## 2022-10-23 DIAGNOSIS — R112 Nausea with vomiting, unspecified: Secondary | ICD-10-CM | POA: Diagnosis not present

## 2022-10-23 DIAGNOSIS — Z79899 Other long term (current) drug therapy: Secondary | ICD-10-CM | POA: Insufficient documentation

## 2022-10-23 DIAGNOSIS — R0602 Shortness of breath: Secondary | ICD-10-CM | POA: Diagnosis not present

## 2022-10-23 DIAGNOSIS — J45909 Unspecified asthma, uncomplicated: Secondary | ICD-10-CM | POA: Diagnosis not present

## 2022-10-23 DIAGNOSIS — D72829 Elevated white blood cell count, unspecified: Secondary | ICD-10-CM | POA: Insufficient documentation

## 2022-10-23 DIAGNOSIS — R6889 Other general symptoms and signs: Secondary | ICD-10-CM | POA: Diagnosis not present

## 2022-10-23 DIAGNOSIS — Z7951 Long term (current) use of inhaled steroids: Secondary | ICD-10-CM | POA: Diagnosis not present

## 2022-10-23 DIAGNOSIS — Z1152 Encounter for screening for COVID-19: Secondary | ICD-10-CM | POA: Insufficient documentation

## 2022-10-23 DIAGNOSIS — T699XXA Effect of reduced temperature, unspecified, initial encounter: Secondary | ICD-10-CM | POA: Diagnosis not present

## 2022-10-23 LAB — COMPREHENSIVE METABOLIC PANEL
ALT: 18 U/L (ref 0–44)
AST: 23 U/L (ref 15–41)
Albumin: 3.8 g/dL (ref 3.5–5.0)
Alkaline Phosphatase: 56 U/L (ref 38–126)
Anion gap: 12 (ref 5–15)
BUN: 25 mg/dL — ABNORMAL HIGH (ref 8–23)
CO2: 24 mmol/L (ref 22–32)
Calcium: 9.5 mg/dL (ref 8.9–10.3)
Chloride: 105 mmol/L (ref 98–111)
Creatinine, Ser: 1.29 mg/dL — ABNORMAL HIGH (ref 0.44–1.00)
GFR, Estimated: 45 mL/min — ABNORMAL LOW (ref >60)
Glucose, Bld: 189 mg/dL — ABNORMAL HIGH (ref 70–99)
Potassium: 4.3 mmol/L (ref 3.5–5.1)
Sodium: 141 mmol/L (ref 135–145)
Total Bilirubin: 0.4 mg/dL (ref 0.3–1.2)
Total Protein: 7.6 g/dL (ref 6.5–8.1)

## 2022-10-23 LAB — CBC WITH DIFFERENTIAL/PLATELET
Abs Immature Granulocytes: 0.03 10*3/uL (ref 0.00–0.07)
Basophils Absolute: 0 10*3/uL (ref 0.0–0.1)
Basophils Relative: 0 %
Eosinophils Absolute: 0 10*3/uL (ref 0.0–0.5)
Eosinophils Relative: 0 %
HCT: 38.8 % (ref 36.0–46.0)
Hemoglobin: 13 g/dL (ref 12.0–15.0)
Immature Granulocytes: 0 %
Lymphocytes Relative: 2 %
Lymphs Abs: 0.3 10*3/uL — ABNORMAL LOW (ref 0.7–4.0)
MCH: 30.6 pg (ref 26.0–34.0)
MCHC: 33.5 g/dL (ref 30.0–36.0)
MCV: 91.3 fL (ref 80.0–100.0)
Monocytes Absolute: 0.4 10*3/uL (ref 0.1–1.0)
Monocytes Relative: 3 %
Neutro Abs: 12.6 10*3/uL — ABNORMAL HIGH (ref 1.7–7.7)
Neutrophils Relative %: 95 %
Platelets: 375 10*3/uL (ref 150–400)
RBC: 4.25 MIL/uL (ref 3.87–5.11)
RDW: 13 % (ref 11.5–15.5)
WBC: 13.4 10*3/uL — ABNORMAL HIGH (ref 4.0–10.5)
nRBC: 0 % (ref 0.0–0.2)

## 2022-10-23 LAB — RESP PANEL BY RT-PCR (FLU A&B, COVID) ARPGX2
Influenza A by PCR: NEGATIVE
Influenza B by PCR: NEGATIVE
SARS Coronavirus 2 by RT PCR: NEGATIVE

## 2022-10-23 LAB — CBG MONITORING, ED: Glucose-Capillary: 182 mg/dL — ABNORMAL HIGH (ref 70–99)

## 2022-10-23 LAB — LIPASE, BLOOD: Lipase: 36 U/L (ref 11–51)

## 2022-10-23 LAB — TROPONIN I (HIGH SENSITIVITY): Troponin I (High Sensitivity): 9 ng/L (ref <18)

## 2022-10-23 MED ORDER — ONDANSETRON HCL 4 MG/2ML IJ SOLN
4.0000 mg | Freq: Once | INTRAMUSCULAR | Status: AC
  Administered 2022-10-23: 4 mg via INTRAVENOUS
  Filled 2022-10-23: qty 2

## 2022-10-23 MED ORDER — ONDANSETRON HCL 4 MG PO TABS: 4.0000 mg | ORAL_TABLET | Freq: Four times a day (QID) | ORAL | 0 refills | Status: DC

## 2022-10-23 MED ORDER — ALBUTEROL SULFATE HFA 108 (90 BASE) MCG/ACT IN AERS
2.0000 | INHALATION_SPRAY | Freq: Once | RESPIRATORY_TRACT | Status: AC
  Administered 2022-10-23: 2 via RESPIRATORY_TRACT
  Filled 2022-10-23: qty 6.7

## 2022-10-23 MED ORDER — LACTATED RINGERS IV BOLUS
1000.0000 mL | Freq: Once | INTRAVENOUS | Status: AC
  Administered 2022-10-23: 1000 mL via INTRAVENOUS

## 2022-10-23 MED ORDER — FAMOTIDINE IN NACL 20-0.9 MG/50ML-% IV SOLN
20.0000 mg | Freq: Once | INTRAVENOUS | Status: AC
  Administered 2022-10-23: 20 mg via INTRAVENOUS
  Filled 2022-10-23: qty 50

## 2022-10-23 NOTE — Discharge Instructions (Signed)
You were seen in the emergency department for your nausea and vomiting.  He only had mild dehydration duration, your labs otherwise were normal.  You may have a viral infection and you may have had a bout of food poisoning that caused your symptoms.  You can continue to take the nausea medication as needed.  You should follow-up with your primary doctor in the next few days to have your symptoms rechecked.  You should return to the emergency department if you develop severe abdominal pain, you are vomiting despite the nausea medication, you have fevers or if you have any other new or concerning symptoms.

## 2022-10-23 NOTE — ED Provider Notes (Signed)
[facility] EMERGENCY DEPARTMENT Provider Note   CSN: [id number] Arrival date & time: 10/23/22  8546     History  Chief Complaint  Patient presents with   Emesis    [name] is a 66 y.o. female.  Patient is a 66 year old female with a past medical history of asthma, hypertension and diabetes presenting to the emergency department with nausea and vomiting.  The patient states she woke up at 4 AM this morning with nausea and vomiting.  She states that she started to develop epigastric pain after repeated episodes of vomiting.  She denies any fevers but states that she did feel hot and sweaty.  She denies any chest pain but does report some shortness of breath however states that she always has some shortness of breath with her asthma.  She states that she has had a few episodes of diarrhea today.  She denies any known sick contacts.  The history is provided by the patient and the spouse.  Emesis      Home Medications Prior to Admission medications   Medication Sig Start Date End Date Taking? Authorizing Provider  ondansetron (ZOFRAN) 4 MG tablet Take 1 tablet (4 mg total) by mouth every 6 (six) hours. 10/23/22  Yes Maylon Peppers, [place] K, DO  albuterol (VENTOLIN HFA) 108 (90 Base) MCG/ACT inhaler INHALE 2 PUFFS BY MOUTH EVERY 6 HOURS AS NEEDED FOR WHEEZING FOR SHORTNESS OF BREATH 09/09/22   [name], MD  AquaLance Lancets 30G MISC CHECK BLOOD SUGAR 2 TIMES A DAY 03/05/22   [name], MD  Aspirin-Salicylamide-Caffeine (BC HEADACHE POWDER PO) Take 1 packet by mouth as needed. Reported on 01/09/2016    [provider]  Blood Glucose Monitoring Suppl (ACCU-CHEK AVIVA PLUS) w/Device KIT USE AS DIRECTED TO CHECK BLOOD GLUCOSE 1 TO 2 TIMES DAILY 05/12/19   [name], MD  glucose blood (ACCU-CHEK AVIVA PLUS) test strip USE ONE STRIP TO CHECK GLUCOSE ONCE TO TWICE DAILY 02/15/20   [name], MD  ibuprofen (ADVIL) 800 MG tablet TAKE 1  TABLET BY MOUTH EVERY 8 HOURS AS NEEDED 07/31/22   [name], MD  losartan-hydrochlorothiazide (HYZAAR) 50-12.5 MG tablet Take 1 tablet by mouth daily. 09/17/22   [name], MD  metformin (FORTAMET) 1000 MG (OSM) 24 hr tablet Take 1 tablet (1,000 mg total) by mouth daily with breakfast. 04/17/22   [name], MD  methocarbamol (ROBAXIN) 500 MG tablet Take 1 tablet (500 mg total) by mouth 2 (two) times daily as needed. Patient not taking: Reported on 09/17/2022 10/08/21   Aundra Dubin, PA-C  montelukast (SINGULAIR) 10 MG tablet Take 1 tablet (10 mg total) by mouth at bedtime. 09/17/22   [name], MD  ondansetron (ZOFRAN-ODT) 8 MG disintegrating tablet Take 1 tablet (8 mg total) by mouth every 8 (eight) hours as needed for nausea or vomiting. 01/19/22   Peri Jefferson, PA-C  pantoprazole (PROTONIX) 40 MG tablet Take 1 tablet (40 mg total) by mouth daily. 09/17/22   [name], [place] 200-62.5-25 MCG/ACT AEPB INHALE 1 PUFF ONCE DAILY 10/02/22   [name], MD      Allergies    Patient has no known allergies.    Review of Systems   Review of Systems  Gastrointestinal:  Positive for vomiting.    Physical Exam Updated Vital Signs BP (!) 151/77   Pulse 84   Temp 98.8 F (37.1 C) (Oral)  Resp 16   Ht _0  (1.676 m)   Wt 97.1 kg   SpO2 99%   BMI 34.54 kg/m  Physical Exam Vitals and nursing note reviewed.  Constitutional:      General: She is not in acute distress.    Appearance: Normal appearance.  HENT:     Head: Normocephalic and atraumatic.     Nose: Nose normal.     Mouth/Throat:     Mouth: Mucous membranes are moist.     Pharynx: Oropharynx is clear.  Eyes:     Extraocular Movements: Extraocular movements intact.     Conjunctiva/sclera: Conjunctivae normal.  Cardiovascular:     Rate and Rhythm: Normal rate and regular rhythm.     Pulses: Normal pulses.     Heart sounds: Normal heart sounds.  Pulmonary:     Effort:  Pulmonary effort is normal.     Breath sounds: Normal breath sounds.  Abdominal:     General: Abdomen is flat.     Palpations: Abdomen is soft.     Tenderness: There is no abdominal tenderness.  Musculoskeletal:        General: Normal range of motion.     Cervical back: Normal range of motion and neck supple.  Skin:    General: Skin is warm and dry.  Neurological:     General: No focal deficit present.     Mental Status: She is alert and oriented to person, place, and time.  Psychiatric:        Mood and Affect: Mood normal.        Behavior: Behavior normal.     ED Results / Procedures / Treatments   Labs (all labs ordered are listed, but only abnormal results are displayed) Labs Reviewed  COMPREHENSIVE METABOLIC PANEL - Abnormal; Notable for the following components:      Result Value   Glucose, Bld 189 (*)    BUN 25 (*)    Creatinine, Ser 1.29 (*)    GFR, Estimated 45 (*)    All other components within normal limits  CBC WITH DIFFERENTIAL/PLATELET - Abnormal; Notable for the following components:   WBC 13.4 (*)    Neutro Abs 12.6 (*)    Lymphs Abs 0.3 (*)    All other components within normal limits  CBG MONITORING, ED - Abnormal; Notable for the following components:   Glucose-Capillary 182 (*)    All other components within normal limits  RESP PANEL BY RT-PCR (FLU A&B, COVID) ARPGX2  LIPASE, BLOOD  TROPONIN I (HIGH SENSITIVITY)    EKG EKG Interpretation  Date/Time:  Thursday October 23 2022 19:22:35 EST Ventricular Rate:  102 PR Interval:  147 QRS Duration: 92 QT Interval:  335 QTC Calculation: 437 R Axis:   87 Text Interpretation: Sinus tachycardia RAE, consider biatrial enlargement Borderline right axis deviation Repol abnrm suggests ischemia, diffuse leads No previous ECGs available Confirmed by Leanord Asal ([id number]) on 10/23/2022 7:51:30 PM  Radiology No results found.  Procedures Procedures    Medications Ordered in ED Medications  lactated  ringers bolus 1,000 mL (0 mLs Intravenous Stopped 10/23/22 2113)  ondansetron (ZOFRAN) injection 4 mg (4 mg Intravenous Given 10/23/22 2019)  famotidine (PEPCID) IVPB 20 mg premix (0 mg Intravenous Stopped 10/23/22 2113)  albuterol (VENTOLIN HFA) 108 (90 Base) MCG/ACT inhaler 2 puff (2 puffs Inhalation Given 10/23/22 2116)    ED Course/ Medical Decision Making/ A&P Clinical Course as of 10/23/22 2200  Thu Oct 23, 2022  2149 Patient has  a mild leukocytosis, labs otherwise within normal range, Cr at baseline. [VK]  2155 On reassessment, the patient's nausea is resolved. [VK]    Clinical Course User Index [VK] Kemper Durie, DO                           Medical Decision Making This patient presents to the ED with chief complaint(s) of nausea vomiting with pertinent past medical history of hypertension, diabetes, asthma which further complicates the presenting complaint. The complaint involves an extensive differential diagnosis and also carries with it a high risk of complications and morbidity.    The differential diagnosis includes hyperglycemia, DKA, gastroenteritis, gastritis, GERD, pancreatitis, hepatitis, cholelithiasis or cholecystitis, atypical ACS, viral syndrome  Additional history obtained: Additional history obtained from spouse Records reviewed Primary Care Documents  ED Course and Reassessment: Upon patient's arrival to the emergency department she is awake and alert well-appearing though complains of significant nausea on exam.  She did have an EKG performed on arrival that had some mild T wave inversions inferiorly without prior EKG for comparison, concerning for possible atypical ACS.  Patient will have labs including a troponin and will be started on IV fluids and given Zofran for her nausea and will be reassessed.  Independent labs interpretation:  The following labs were independently interpreted: within normal range  Independent visualization of  imaging: N/A  Consultation: - Consulted or discussed management/test interpretation w/ external professional: N/A  Consideration for admission or further workup: Patient has no emergent conditions requiring admission or further work-up at this time and is stable for discharge home with primary care follow-up Social Determinants of health: N/A    Amount and/or Complexity of Data Reviewed Labs: ordered.  Risk Prescription drug management.          Final Clinical Impression(s) / ED Diagnoses Final diagnoses:  Gastroenteritis    Rx / DC Orders ED Discharge Orders          Ordered    ondansetron (ZOFRAN) 4 MG tablet  Every 6 hours        10/23/22 2157              Kemper Durie, DO 10/23/22 2200

## 2022-10-23 NOTE — ED Triage Notes (Addendum)
Pt bib [name] complaining of nausea and vomiting since 0400, pt has not been able to take anything to help. Pt stated this has happened before. She called ems the last time this occurred.   Pt is complaining of diarrhea as well but said she took 2 "xlax" pills

## 2022-10-27 ENCOUNTER — Telehealth: Payer: Self-pay

## 2022-10-27 NOTE — Telephone Encounter (Signed)
Transition Care Management Follow-up Telephone Call Date of discharge and from where: [name] 10/23/22 How have you been since you were released from the hospital? [place] Any questions or concerns? No  Items Reviewed: Did the pt receive and understand the discharge instructions provided? Yes  Medications obtained and verified? Yes  Other? No  Any new allergies since your discharge? No  Dietary orders reviewed? Yes Do you have support at home? Yes   Home Care and Equipment/Supplies: Were home health services ordered? no   Follow up appointments reviewed:  PCP Hospital f/u appt confirmed? No  per pt. No f/u needed  [facility] f/u appt confirmed? No   Are transportation arrangements needed? No  If their condition worsens, is the pt aware to call PCP or go to the Emergency Dept.? Yes Was the patient provided with contact information for the PCP's office or ED? Yes Was to pt encouraged to call back with questions or concerns? Yes

## 2022-11-04 ENCOUNTER — Inpatient Hospital Stay: Admission: RE | Admit: 2022-11-04 | Payer: Medicare Other | Source: Ambulatory Visit

## 2022-11-04 DIAGNOSIS — H47393 Other disorders of optic disc, bilateral: Secondary | ICD-10-CM | POA: Diagnosis not present

## 2022-11-04 DIAGNOSIS — H25812 Combined forms of age-related cataract, left eye: Secondary | ICD-10-CM | POA: Diagnosis not present

## 2022-11-04 DIAGNOSIS — H25811 Combined forms of age-related cataract, right eye: Secondary | ICD-10-CM | POA: Diagnosis not present

## 2022-11-06 ENCOUNTER — Other Ambulatory Visit (INDEPENDENT_AMBULATORY_CARE_PROVIDER_SITE_OTHER): Payer: Medicare Other

## 2022-11-06 ENCOUNTER — Encounter: Payer: Self-pay | Admitting: Family Medicine

## 2022-11-06 DIAGNOSIS — Z23 Encounter for immunization: Secondary | ICD-10-CM | POA: Diagnosis not present

## 2022-11-08 ENCOUNTER — Other Ambulatory Visit: Payer: Self-pay | Admitting: Family Medicine

## 2022-11-09 ENCOUNTER — Other Ambulatory Visit: Payer: Self-pay | Admitting: Family Medicine

## 2022-11-09 DIAGNOSIS — I152 Hypertension secondary to endocrine disorders: Secondary | ICD-10-CM

## 2022-11-11 DIAGNOSIS — H25812 Combined forms of age-related cataract, left eye: Secondary | ICD-10-CM | POA: Diagnosis not present

## 2022-11-11 DIAGNOSIS — H25811 Combined forms of age-related cataract, right eye: Secondary | ICD-10-CM | POA: Diagnosis not present

## 2022-11-13 ENCOUNTER — Telehealth: Payer: Self-pay | Admitting: Family Medicine

## 2022-11-13 DIAGNOSIS — E1159 Type 2 diabetes mellitus with other circulatory complications: Secondary | ICD-10-CM

## 2022-11-13 MED ORDER — LISINOPRIL-HYDROCHLOROTHIAZIDE 20-12.5 MG PO TABS: 1.0000 | ORAL_TABLET | Freq: Every day | ORAL | 3 refills | Status: DC

## 2022-11-13 NOTE — Telephone Encounter (Signed)
[name] called wanted to let Dr.Lalonde know her levels wasn't high enough so she was not able to join the experiment yall discussed.  Also she wants to go back to lisinopril instead of losartan, she wasn't sure why she was switched. Prefers [name], [name]

## 2022-11-21 ENCOUNTER — Other Ambulatory Visit: Payer: Self-pay | Admitting: Family Medicine

## 2022-11-21 DIAGNOSIS — F17200 Nicotine dependence, unspecified, uncomplicated: Secondary | ICD-10-CM

## 2022-11-21 NOTE — Telephone Encounter (Signed)
Refill request last apt 09/17/22 next apt 09/24/23.

## 2022-11-27 ENCOUNTER — Telehealth: Payer: Self-pay | Admitting: Internal Medicine

## 2022-11-27 NOTE — Telephone Encounter (Signed)
Pt did NOT request anything from [facility].   I will deny this

## 2022-11-28 ENCOUNTER — Other Ambulatory Visit: Payer: Self-pay | Admitting: Family Medicine

## 2022-11-28 DIAGNOSIS — J4530 Mild persistent asthma, uncomplicated: Secondary | ICD-10-CM

## 2022-11-30 ENCOUNTER — Emergency Department (HOSPITAL_COMMUNITY): Payer: Medicare Other

## 2022-11-30 ENCOUNTER — Inpatient Hospital Stay (HOSPITAL_COMMUNITY)
Admission: EM | Admit: 2022-11-30 | Discharge: 2022-12-04 | DRG: 193 | Disposition: A | Discharge status: Home Health | Payer: Medicare Other | Attending: Internal Medicine | Admitting: Internal Medicine

## 2022-11-30 ENCOUNTER — Other Ambulatory Visit: Payer: Self-pay

## 2022-11-30 DIAGNOSIS — Z7951 Long term (current) use of inhaled steroids: Secondary | ICD-10-CM

## 2022-11-30 DIAGNOSIS — M549 Dorsalgia, unspecified: Secondary | ICD-10-CM | POA: Diagnosis not present

## 2022-11-30 DIAGNOSIS — N179 Acute kidney failure, unspecified: Secondary | ICD-10-CM | POA: Diagnosis present

## 2022-11-30 DIAGNOSIS — Z801 Family history of malignant neoplasm of trachea, bronchus and lung: Secondary | ICD-10-CM | POA: Diagnosis not present

## 2022-11-30 DIAGNOSIS — N1831 Chronic kidney disease, stage 3a: Secondary | ICD-10-CM | POA: Diagnosis not present

## 2022-11-30 DIAGNOSIS — G8929 Other chronic pain: Secondary | ICD-10-CM | POA: Diagnosis present

## 2022-11-30 DIAGNOSIS — E872 Acidosis, unspecified: Secondary | ICD-10-CM | POA: Diagnosis present

## 2022-11-30 DIAGNOSIS — J121 Respiratory syncytial virus pneumonia: Secondary | ICD-10-CM | POA: Diagnosis not present

## 2022-11-30 DIAGNOSIS — J440 Chronic obstructive pulmonary disease with acute lower respiratory infection: Secondary | ICD-10-CM | POA: Diagnosis not present

## 2022-11-30 DIAGNOSIS — I129 Hypertensive chronic kidney disease with stage 1 through stage 4 chronic kidney disease, or unspecified chronic kidney disease: Secondary | ICD-10-CM | POA: Diagnosis not present

## 2022-11-30 DIAGNOSIS — K047 Periapical abscess without sinus: Secondary | ICD-10-CM | POA: Diagnosis present

## 2022-11-30 DIAGNOSIS — R7401 Elevation of levels of liver transaminase levels: Secondary | ICD-10-CM | POA: Diagnosis present

## 2022-11-30 DIAGNOSIS — Z1152 Encounter for screening for COVID-19: Secondary | ICD-10-CM

## 2022-11-30 DIAGNOSIS — K219 Gastro-esophageal reflux disease without esophagitis: Secondary | ICD-10-CM | POA: Diagnosis present

## 2022-11-30 DIAGNOSIS — Z87891 Personal history of nicotine dependence: Secondary | ICD-10-CM

## 2022-11-30 DIAGNOSIS — J45901 Unspecified asthma with (acute) exacerbation: Secondary | ICD-10-CM | POA: Diagnosis not present

## 2022-11-30 DIAGNOSIS — E1165 Type 2 diabetes mellitus with hyperglycemia: Secondary | ICD-10-CM | POA: Diagnosis not present

## 2022-11-30 DIAGNOSIS — Z7984 Long term (current) use of oral hypoglycemic drugs: Secondary | ICD-10-CM

## 2022-11-30 DIAGNOSIS — E1122 Type 2 diabetes mellitus with diabetic chronic kidney disease: Secondary | ICD-10-CM | POA: Diagnosis not present

## 2022-11-30 DIAGNOSIS — J449 Chronic obstructive pulmonary disease, unspecified: Secondary | ICD-10-CM | POA: Diagnosis not present

## 2022-11-30 DIAGNOSIS — K5900 Constipation, unspecified: Secondary | ICD-10-CM | POA: Diagnosis not present

## 2022-11-30 DIAGNOSIS — Z6833 Body mass index (BMI) 33.0-33.9, adult: Secondary | ICD-10-CM

## 2022-11-30 DIAGNOSIS — J45909 Unspecified asthma, uncomplicated: Secondary | ICD-10-CM | POA: Diagnosis not present

## 2022-11-30 DIAGNOSIS — T380X5A Adverse effect of glucocorticoids and synthetic analogues, initial encounter: Secondary | ICD-10-CM | POA: Diagnosis not present

## 2022-11-30 DIAGNOSIS — J441 Chronic obstructive pulmonary disease with (acute) exacerbation: Secondary | ICD-10-CM | POA: Diagnosis not present

## 2022-11-30 DIAGNOSIS — R062 Wheezing: Secondary | ICD-10-CM | POA: Diagnosis not present

## 2022-11-30 DIAGNOSIS — R0902 Hypoxemia: Secondary | ICD-10-CM | POA: Diagnosis not present

## 2022-11-30 DIAGNOSIS — R0602 Shortness of breath: Secondary | ICD-10-CM | POA: Diagnosis not present

## 2022-11-30 DIAGNOSIS — Z79899 Other long term (current) drug therapy: Secondary | ICD-10-CM

## 2022-11-30 DIAGNOSIS — E118 Type 2 diabetes mellitus with unspecified complications: Secondary | ICD-10-CM

## 2022-11-30 DIAGNOSIS — E669 Obesity, unspecified: Secondary | ICD-10-CM | POA: Diagnosis present

## 2022-11-30 DIAGNOSIS — R059 Cough, unspecified: Secondary | ICD-10-CM | POA: Diagnosis not present

## 2022-11-30 DIAGNOSIS — J9601 Acute respiratory failure with hypoxia: Secondary | ICD-10-CM | POA: Diagnosis not present

## 2022-11-30 DIAGNOSIS — R0600 Dyspnea, unspecified: Principal | ICD-10-CM

## 2022-11-30 DIAGNOSIS — R6889 Other general symptoms and signs: Secondary | ICD-10-CM | POA: Diagnosis not present

## 2022-11-30 DIAGNOSIS — Z743 Need for continuous supervision: Secondary | ICD-10-CM | POA: Diagnosis not present

## 2022-11-30 DIAGNOSIS — J4530 Mild persistent asthma, uncomplicated: Secondary | ICD-10-CM

## 2022-11-30 DIAGNOSIS — E1159 Type 2 diabetes mellitus with other circulatory complications: Secondary | ICD-10-CM

## 2022-11-30 LAB — RESP PANEL BY RT-PCR (RSV, FLU A&B, COVID)  RVPGX2
Influenza A by PCR: NEGATIVE
Influenza B by PCR: NEGATIVE
Resp Syncytial Virus by PCR: POSITIVE — AB
SARS Coronavirus 2 by RT PCR: NEGATIVE

## 2022-11-30 LAB — CBC WITH DIFFERENTIAL/PLATELET
Abs Immature Granulocytes: 0.04 10*3/uL (ref 0.00–0.07)
Basophils Absolute: 0.1 10*3/uL (ref 0.0–0.1)
Basophils Relative: 0 %
Eosinophils Absolute: 0.2 10*3/uL (ref 0.0–0.5)
Eosinophils Relative: 1 %
HCT: 37.7 % (ref 36.0–46.0)
Hemoglobin: 12.4 g/dL (ref 12.0–15.0)
Immature Granulocytes: 0 %
Lymphocytes Relative: 9 %
Lymphs Abs: 1.1 10*3/uL (ref 0.7–4.0)
MCH: 30 pg (ref 26.0–34.0)
MCHC: 32.9 g/dL (ref 30.0–36.0)
MCV: 91.3 fL (ref 80.0–100.0)
Monocytes Absolute: 0.9 10*3/uL (ref 0.1–1.0)
Monocytes Relative: 8 %
Neutro Abs: 9.6 10*3/uL — ABNORMAL HIGH (ref 1.7–7.7)
Neutrophils Relative %: 82 %
Platelets: 333 10*3/uL (ref 150–400)
RBC: 4.13 MIL/uL (ref 3.87–5.11)
RDW: 13.2 % (ref 11.5–15.5)
WBC: 11.9 10*3/uL — ABNORMAL HIGH (ref 4.0–10.5)
nRBC: 0 % (ref 0.0–0.2)

## 2022-11-30 LAB — BASIC METABOLIC PANEL
Anion gap: 10 (ref 5–15)
BUN: 12 mg/dL (ref 8–23)
CO2: 24 mmol/L (ref 22–32)
Calcium: 9.2 mg/dL (ref 8.9–10.3)
Chloride: 103 mmol/L (ref 98–111)
Creatinine, Ser: 1.14 mg/dL — ABNORMAL HIGH (ref 0.44–1.00)
GFR, Estimated: 52 mL/min — ABNORMAL LOW (ref >60)
Glucose, Bld: 183 mg/dL — ABNORMAL HIGH (ref 70–99)
Potassium: 3.8 mmol/L (ref 3.5–5.1)
Sodium: 137 mmol/L (ref 135–145)

## 2022-11-30 LAB — I-STAT VENOUS BLOOD GAS, ED
Acid-Base Excess: 1 mmol/L (ref 0.0–2.0)
Bicarbonate: 26.1 mmol/L (ref 20.0–28.0)
Calcium, Ion: 1.14 mmol/L — ABNORMAL LOW (ref 1.15–1.40)
HCT: 38 % (ref 36.0–46.0)
Hemoglobin: 12.9 g/dL (ref 12.0–15.0)
O2 Saturation: 54 %
Potassium: 3.8 mmol/L (ref 3.5–5.1)
Sodium: 139 mmol/L (ref 135–145)
TCO2: 27 mmol/L (ref 22–32)
pCO2, Ven: 40.5 mmHg — ABNORMAL LOW (ref 44–60)
pH, Ven: 7.417 (ref 7.25–7.43)
pO2, Ven: 28 mmHg — CL (ref 32–45)

## 2022-11-30 LAB — TROPONIN I (HIGH SENSITIVITY)
Troponin I (High Sensitivity): 19 ng/L — ABNORMAL HIGH (ref <18)
Troponin I (High Sensitivity): 19 ng/L — ABNORMAL HIGH (ref <18)

## 2022-11-30 LAB — BRAIN NATRIURETIC PEPTIDE: B Natriuretic Peptide: 43.5 pg/mL (ref 0.0–100.0)

## 2022-11-30 LAB — CBG MONITORING, ED: Glucose-Capillary: 240 mg/dL — ABNORMAL HIGH (ref 70–99)

## 2022-11-30 LAB — HIV ANTIBODY (ROUTINE TESTING W REFLEX): HIV Screen 4th Generation wRfx: NONREACTIVE

## 2022-11-30 MED ORDER — BUDESONIDE 0.5 MG/2ML IN SUSP: 2.0000 mg | Freq: Two times a day (BID) | RESPIRATORY_TRACT | Status: DC

## 2022-11-30 MED ORDER — IPRATROPIUM-ALBUTEROL 0.5-2.5 (3) MG/3ML IN SOLN
3.0000 mL | Freq: Four times a day (QID) | RESPIRATORY_TRACT | Status: DC
  Administered 2022-11-30 – 2022-12-01 (×2): 3 mL via RESPIRATORY_TRACT
  Filled 2022-11-30 (×3): qty 3

## 2022-11-30 MED ORDER — MAGNESIUM SULFATE 2 GM/50ML IV SOLN
2.0000 g | Freq: Once | INTRAVENOUS | Status: AC
  Administered 2022-11-30: 2 g via INTRAVENOUS
  Filled 2022-11-30: qty 50

## 2022-11-30 MED ORDER — METHYLPREDNISOLONE SODIUM SUCC 125 MG IJ SOLR
125.0000 mg | Freq: Once | INTRAMUSCULAR | Status: AC
  Administered 2022-11-30: 125 mg via INTRAVENOUS
  Filled 2022-11-30: qty 2

## 2022-11-30 MED ORDER — HYDROCHLOROTHIAZIDE 12.5 MG PO TABS
12.5000 mg | ORAL_TABLET | Freq: Every day | ORAL | Status: DC
  Administered 2022-11-30 – 2022-12-01 (×2): 12.5 mg via ORAL
  Filled 2022-11-30 (×2): qty 1

## 2022-11-30 MED ORDER — ONDANSETRON HCL 4 MG PO TABS
4.0000 mg | ORAL_TABLET | Freq: Four times a day (QID) | ORAL | Status: DC
  Administered 2022-11-30 – 2022-12-04 (×12): 4 mg via ORAL
  Filled 2022-11-30 (×13): qty 1

## 2022-11-30 MED ORDER — ALBUTEROL SULFATE HFA 108 (90 BASE) MCG/ACT IN AERS: 1.0000 | INHALATION_SPRAY | RESPIRATORY_TRACT | Status: DC | PRN

## 2022-11-30 MED ORDER — PREDNISONE 10 MG PO TABS: 20.0000 mg | ORAL_TABLET | Freq: Every day | ORAL | 0 refills | Status: DC

## 2022-11-30 MED ORDER — METFORMIN HCL ER 500 MG PO TB24
1000.0000 mg | ORAL_TABLET | Freq: Every day | ORAL | Status: DC
  Administered 2022-12-01: 1000 mg via ORAL
  Filled 2022-11-30: qty 2

## 2022-11-30 MED ORDER — BUDESONIDE 0.5 MG/2ML IN SUSP
2.0000 mg | Freq: Two times a day (BID) | RESPIRATORY_TRACT | Status: DC
  Administered 2022-12-01: 2 mg via RESPIRATORY_TRACT
  Filled 2022-11-30: qty 8

## 2022-11-30 MED ORDER — LISINOPRIL-HYDROCHLOROTHIAZIDE 20-12.5 MG PO TABS: 1.0000 | ORAL_TABLET | Freq: Every day | ORAL | Status: DC

## 2022-11-30 MED ORDER — INSULIN ASPART 100 UNIT/ML IJ SOLN
0.0000 [IU] | Freq: Three times a day (TID) | INTRAMUSCULAR | Status: DC
  Administered 2022-11-30: 5 [IU] via SUBCUTANEOUS
  Administered 2022-12-01 (×2): 2 [IU] via SUBCUTANEOUS
  Administered 2022-12-01: 3 [IU] via SUBCUTANEOUS
  Administered 2022-12-02: 2 [IU] via SUBCUTANEOUS
  Administered 2022-12-02: 5 [IU] via SUBCUTANEOUS
  Administered 2022-12-03 (×2): 3 [IU] via SUBCUTANEOUS
  Administered 2022-12-03: 2 [IU] via SUBCUTANEOUS
  Administered 2022-12-04: 3 [IU] via SUBCUTANEOUS

## 2022-11-30 MED ORDER — PREDNISONE 20 MG PO TABS: 40.0000 mg | ORAL_TABLET | Freq: Every day | ORAL | Status: DC

## 2022-11-30 MED ORDER — HYDRALAZINE HCL 25 MG PO TABS: 25.0000 mg | ORAL_TABLET | Freq: Four times a day (QID) | ORAL | Status: DC | PRN

## 2022-11-30 MED ORDER — FLUTICASONE FUROATE-VILANTEROL 200-25 MCG/ACT IN AEPB
1.0000 | INHALATION_SPRAY | Freq: Every day | RESPIRATORY_TRACT | Status: DC
  Administered 2022-12-01: 1 via RESPIRATORY_TRACT
  Filled 2022-11-30: qty 28

## 2022-11-30 MED ORDER — METHYLPREDNISOLONE SODIUM SUCC 40 MG IJ SOLR
40.0000 mg | Freq: Two times a day (BID) | INTRAMUSCULAR | Status: AC
  Administered 2022-11-30 – 2022-12-01 (×2): 40 mg via INTRAVENOUS
  Filled 2022-11-30 (×2): qty 1

## 2022-11-30 MED ORDER — IPRATROPIUM-ALBUTEROL 0.5-2.5 (3) MG/3ML IN SOLN
3.0000 mL | Freq: Once | RESPIRATORY_TRACT | Status: AC
  Administered 2022-11-30: 3 mL via RESPIRATORY_TRACT
  Filled 2022-11-30: qty 3

## 2022-11-30 MED ORDER — AZITHROMYCIN 250 MG PO TABS: 250.0000 mg | ORAL_TABLET | Freq: Every day | ORAL | 0 refills | Status: DC

## 2022-11-30 MED ORDER — ENOXAPARIN SODIUM 40 MG/0.4ML IJ SOSY
40.0000 mg | PREFILLED_SYRINGE | INTRAMUSCULAR | Status: DC
  Administered 2022-12-01 – 2022-12-03 (×3): 40 mg via SUBCUTANEOUS
  Filled 2022-11-30 (×4): qty 0.4

## 2022-11-30 MED ORDER — MONTELUKAST SODIUM 10 MG PO TABS
10.0000 mg | ORAL_TABLET | Freq: Every day | ORAL | Status: DC
  Administered 2022-11-30 – 2022-12-03 (×4): 10 mg via ORAL
  Filled 2022-11-30 (×4): qty 1

## 2022-11-30 MED ORDER — ALBUTEROL SULFATE (2.5 MG/3ML) 0.083% IN NEBU
2.5000 mg | INHALATION_SOLUTION | Freq: Four times a day (QID) | RESPIRATORY_TRACT | Status: DC | PRN
  Administered 2022-11-30 – 2022-12-01 (×2): 2.5 mg via RESPIRATORY_TRACT
  Filled 2022-11-30 (×3): qty 3

## 2022-11-30 MED ORDER — LISINOPRIL 20 MG PO TABS
20.0000 mg | ORAL_TABLET | Freq: Every day | ORAL | Status: DC
  Administered 2022-11-30 – 2022-12-01 (×2): 20 mg via ORAL
  Filled 2022-11-30 (×2): qty 1

## 2022-11-30 MED ORDER — IBUPROFEN 400 MG PO TABS
800.0000 mg | ORAL_TABLET | Freq: Three times a day (TID) | ORAL | Status: DC | PRN
  Administered 2022-12-01: 800 mg via ORAL
  Filled 2022-11-30: qty 1

## 2022-11-30 MED ORDER — UMECLIDINIUM BROMIDE 62.5 MCG/ACT IN AEPB
1.0000 | INHALATION_SPRAY | Freq: Every day | RESPIRATORY_TRACT | Status: DC
  Administered 2022-12-01: 1 via RESPIRATORY_TRACT
  Filled 2022-11-30: qty 7

## 2022-11-30 MED ORDER — GUAIFENESIN ER 600 MG PO TB12
1200.0000 mg | ORAL_TABLET | Freq: Two times a day (BID) | ORAL | Status: DC
  Administered 2022-11-30 – 2022-12-04 (×9): 1200 mg via ORAL
  Filled 2022-11-30 (×10): qty 2

## 2022-11-30 MED ORDER — PENICILLIN V POTASSIUM 250 MG PO TABS
500.0000 mg | ORAL_TABLET | Freq: Four times a day (QID) | ORAL | Status: DC
  Administered 2022-12-01 – 2022-12-03 (×7): 500 mg via ORAL
  Filled 2022-11-30 (×11): qty 2

## 2022-11-30 MED ORDER — MENTHOL 3 MG MT LOZG
1.0000 | LOZENGE | OROMUCOSAL | Status: DC | PRN
  Administered 2022-12-03: 3 mg via ORAL
  Filled 2022-11-30: qty 9

## 2022-11-30 MED ORDER — ONDANSETRON 4 MG PO TBDP: 8.0000 mg | ORAL_TABLET | Freq: Three times a day (TID) | ORAL | Status: DC | PRN

## 2022-11-30 MED ORDER — CARVEDILOL 3.125 MG PO TABS
3.1250 mg | ORAL_TABLET | Freq: Two times a day (BID) | ORAL | Status: DC
  Administered 2022-11-30 – 2022-12-04 (×8): 3.125 mg via ORAL
  Filled 2022-11-30 (×8): qty 1

## 2022-11-30 MED ORDER — PANTOPRAZOLE SODIUM 40 MG PO TBEC
40.0000 mg | DELAYED_RELEASE_TABLET | Freq: Every day | ORAL | Status: DC
  Administered 2022-11-30 – 2022-12-04 (×5): 40 mg via ORAL
  Filled 2022-11-30 (×5): qty 1

## 2022-11-30 NOTE — ED Provider Notes (Addendum)
[facility] EMERGENCY DEPARTMENT Provider Note   CSN: [id number] Arrival date & time: 11/30/22  1003     History  Chief Complaint  Patient presents with   Shortness of [name] [name] is a 66 y.o. female.  66 year old female with prior medical history as detailed below presents for evaluation.  Patient reports increasing congestion and wheezing for the last 4 days.  Patient reports using nebulizers inhalers at home without improvement.  Patient was noted to be hypoxic on room air with EMS on their initial evaluation.  Room air sats were 85%.  Patient is without reported use of supplemental oxygen.  Patient reports feeling somewhat improved after administration of 125 mg of Solu-Medrol, 2 DuoNeb breathing treatments with EMS.  She denies fever.  She denies chest pain.  Patient is able to speak in full sentences.  She denies prior admission for asthma/COPD.  The history is provided by the patient and medical records.       Home Medications Prior to Admission medications   Medication Sig Start Date End Date Taking? Authorizing Provider  albuterol (VENTOLIN HFA) 108 (90 Base) MCG/ACT inhaler INHALE 2 PUFFS BY MOUTH EVERY 6 HOURS AS NEEDED FOR WHEEZING FOR SHORTNESS OF BREATH 11/28/22   [name], MD  AquaLance Lancets 30G MISC CHECK BLOOD SUGAR 2 TIMES A DAY 03/05/22   [name], MD  Aspirin-Salicylamide-Caffeine (BC HEADACHE POWDER PO) Take 1 packet by mouth as needed. Reported on 01/09/2016    [provider]  Blood Glucose Monitoring Suppl (ACCU-CHEK AVIVA PLUS) w/Device KIT USE AS DIRECTED TO CHECK BLOOD GLUCOSE 1 TO 2 TIMES DAILY 05/12/19   [name], MD  Fluticasone-Umeclidin-Vilant (TRELEGY ELLIPTA) 200-62.5-25 MCG/ACT AEPB INHALE 1 PUFF ONCE DAILY 11/10/22   [name], MD  glucose blood (ACCU-CHEK AVIVA PLUS) test strip USE ONE STRIP TO CHECK GLUCOSE ONCE TO TWICE DAILY 02/15/20   [name], MD  ibuprofen  (ADVIL) 800 MG tablet TAKE 1 TABLET BY MOUTH EVERY 8 HOURS AS NEEDED 11/21/22   [name], MD  lisinopril-hydrochlorothiazide (ZESTORETIC) 20-12.5 MG tablet Take 1 tablet by mouth daily. 11/13/22   [name], MD  metformin (FORTAMET) 1000 MG (OSM) 24 hr tablet Take 1 tablet (1,000 mg total) by mouth daily with breakfast. 04/17/22   [name], MD  methocarbamol (ROBAXIN) 500 MG tablet Take 1 tablet (500 mg total) by mouth 2 (two) times daily as needed. Patient not taking: Reported on 09/17/2022 10/08/21   Aundra Dubin, PA-C  montelukast (SINGULAIR) 10 MG tablet Take 1 tablet (10 mg total) by mouth at bedtime. 09/17/22   [name], MD  ondansetron (ZOFRAN) 4 MG tablet Take 1 tablet (4 mg total) by mouth every 6 (six) hours. 10/23/22   Leanord Asal K, DO  ondansetron (ZOFRAN-ODT) 8 MG disintegrating tablet Take 1 tablet (8 mg total) by mouth every 8 (eight) hours as needed for nausea or vomiting. 01/19/22   Peri Jefferson, PA-C  pantoprazole (PROTONIX) 40 MG tablet Take 1 tablet (40 mg total) by mouth daily. 09/17/22   [name], MD      Allergies    Patient has no known allergies.    Review of Systems   Review of Systems  All other systems reviewed and are negative.   Physical Exam Updated Vital Signs BP (!) 173/79   Pulse (!) 103   Temp (!) 97.5 F (36.4 C) (Oral)  Resp (!) 24   Ht 5' 6" (1.676 m)   Wt 95.3 kg   SpO2 92%   BMI 33.89 kg/m  Physical Exam Vitals and nursing note reviewed.  Constitutional:      General: She is not in acute distress.    Appearance: Normal appearance. She is well-developed.  HENT:     Head: Normocephalic and atraumatic.  Eyes:     Conjunctiva/sclera: Conjunctivae normal.     Pupils: Pupils are equal, round, and reactive to light.  Cardiovascular:     Rate and Rhythm: Normal rate and regular rhythm.     Heart sounds: Normal heart sounds. No murmur heard. Pulmonary:     Effort: Tachypnea present. No  respiratory distress.     Comments: Diffuse expiratory wheezes in all lung fields. Abdominal:     General: There is no distension.     Palpations: Abdomen is soft.     Tenderness: There is no abdominal tenderness.  Musculoskeletal:        General: No swelling or deformity. Normal range of motion.     Cervical back: Normal range of motion and neck supple.  Skin:    General: Skin is warm and dry.     Capillary Refill: Capillary refill takes less than 2 seconds.  Neurological:     General: No focal deficit present.     Mental Status: She is alert and oriented to person, place, and time.  Psychiatric:        Mood and Affect: Mood normal.     ED Results / Procedures / Treatments   Labs (all labs ordered are listed, but only abnormal results are displayed) Labs Reviewed  RESP PANEL BY RT-PCR (RSV, FLU A&B, COVID)  RVPGX2 - Abnormal; Notable for the following components:      Result Value   Resp Syncytial Virus by PCR POSITIVE (*)    All other components within normal limits  CBC WITH DIFFERENTIAL/PLATELET - Abnormal; Notable for the following components:   WBC 11.9 (*)    Neutro Abs 9.6 (*)    All other components within normal limits  BASIC METABOLIC PANEL - Abnormal; Notable for the following components:   Glucose, Bld 183 (*)    Creatinine, Ser 1.14 (*)    GFR, Estimated 52 (*)    All other components within normal limits  I-STAT VENOUS BLOOD GAS, ED - Abnormal; Notable for the following components:   pCO2, Ven 40.5 (*)    pO2, Ven 28 (*)    Calcium, Ion 1.14 (*)    All other components within normal limits  TROPONIN I (HIGH SENSITIVITY) - Abnormal; Notable for the following components:   Troponin I (High Sensitivity) 19 (*)    All other components within normal limits  TROPONIN I (HIGH SENSITIVITY) - Abnormal; Notable for the following components:   Troponin I (High Sensitivity) 19 (*)    All other components within normal limits  BRAIN NATRIURETIC PEPTIDE    EKG EKG  Interpretation  Date/Time:  Sunday November 30 2022 10:12:31 EST Ventricular Rate:  101 PR Interval:  168 QRS Duration: 91 QT Interval:  343 QTC Calculation: 445 R Axis:   83 Text Interpretation: Sinus tachycardia Borderline right axis deviation Borderline repolarization abnormality Confirmed by Dene Gentry [phone]) on 11/30/2022 10:15:54 AM  Radiology No results found.  Procedures Procedures    Medications Ordered in ED Medications  ipratropium-albuterol (DUONEB) 0.5-2.5 (3) MG/3ML nebulizer solution 3 mL (has no administration in time range)  ED Course/ Medical Decision Making/ A&P Clinical Course as of 11/30/22 1518  Sun Nov 30, 2022  1509 COPD exacerbation.  RSV + getting treated frequently.  [CC]    Clinical Course User Index [CC] Tretha Sciara, MD                           Medical Decision Making Amount and/or Complexity of Data Reviewed Labs: ordered. Radiology: ordered.  Risk Prescription drug management. Decision regarding hospitalization.    Medical Screen Complete  This patient presented to the ED with complaint of dyspnea/wheezing.  This complaint involves an extensive number of treatment options. The initial differential diagnosis includes, but is not limited to, COPD exacerbation, viral infection, pneumonia, metabolic abnormality, etc.  This presentation is: Acute, Chronic, Self-Limited, Previously Undiagnosed, Uncertain Prognosis, Complicated, Systemic Symptoms, and Threat to Life/Bodily Function  Patient is presenting with complaint of increased shortness of breath and wheezing.  Presentation is consistent with COPD exacerbation.    Patient is noted to be hypoxic on room air with initial room air sats in the 85% range.  Patient is showing improvement after initial nebulizer treatments.  Patient would likely benefit greatly from admission.  However, patient refuses admission.  She desires discharge home.  She is advised to going home  in her current state is unwise and may result in significant morbidity and/or worsening of symptoms.  Desires to leave [place].  She has capacity to refuse medical care.  She repeatedly refuses admission.  1445 Patient changed mind about leaving.  She is in agreement with plan to admit.  Medicine service made aware case will evaluate for admission.    Additional history obtained:  External records from outside sources obtained and reviewed including prior ED visits and prior Inpatient records.    Lab Tests:  I ordered and personally interpreted labs.  The pertinent results include: CBC, BMP, COVID flu   Imaging Studies ordered:  I ordered imaging studies including chest x-ray I independently visualized and interpreted obtained imaging which showed NAD I agree with the radiologist interpretation.   Cardiac Monitoring:  The patient was maintained on a cardiac monitor.  I personally viewed and interpreted the cardiac monitor which showed an underlying rhythm of: NSR   Medicines ordered:  I ordered medication including DuoNeb for rhonchal spasm Reevaluation of the patient after these medicines showed that the patient: improved    Problem List / ED Course:  Shortness of breath, bronchospasm   Reevaluation:  After the interventions noted above, I reevaluated the patient and found that they have: improved   Disposition:  After consideration of the diagnostic results and the patients response to treatment, I feel that the patent would benefit from admission.          Final Clinical Impression(s) / ED Diagnoses Final diagnoses:  Dyspnea, unspecified type    Rx / DC Orders ED Discharge Orders          Ordered    azithromycin (ZITHROMAX) 250 MG tablet  Daily        11/30/22 1413    predniSONE (DELTASONE) 10 MG tablet  Daily        11/30/22 1413              Valarie Merino, MD 11/30/22 1414    Valarie Merino, MD 11/30/22  [phone]

## 2022-11-30 NOTE — Progress Notes (Signed)
Patient has been taking penicillin V every 6 hours since yesterday for tooth infection, will continue this time.

## 2022-11-30 NOTE — H&P (Signed)
History and Physical    [name] EXN:[id number] DOB: [birth year] DOA: 11/30/2022  PCP: [name], MD (Confirm with patient/family/NH records and if not entered, this has to be entered at [facility] point of entry) Patient coming from: Home  I have personally briefly reviewed patient's old medical records in [place]  Chief Complaint: Wheezing, cough, shortness of breath  HPI: [name] is a 66 y.o. female with medical history significant of COPD Gold stage II, HTN, chronic back pain, presented with worsening of cough, congestion,, shortness of breath.  Symptoms started 4 days ago, patient started to have nasal congestion, then developed a productive cough with clear phlegm, and started to have wheezing and increasing shortness of breath.  No fever or chills and symptoms getting worse since yesterday.  Called EMS, arrived and found patient O2 sat reading in the 70s patient was given IV Solu-Medrol 125 mg x 1 and 2 breathing treatment en route, no significant improvement of symptoms on arrival.  In the ED, patient workup benign chest x-ray no acute infiltrates, respiratory panel came back with RSV positive COVID and flu negative.   Review of Systems: As per HPI otherwise 14 point review of systems negative.    Past Medical History:  Diagnosis Date   Arthritis    Asthma    Colonic polyp    GERD (gastroesophageal reflux disease)    Hypertension    Obesity    Pain in limb    Smoker    Swelling of limb     Past Surgical History:  Procedure Laterality Date   BIOPSY BREAST Left 09/22/2018   no maglignancy   COLONOSCOPY  2009   Dr. Henrene Pastor   POLYPECTOMY       reports that she has quit smoking. Her smoking use included cigarettes. She has a 2.50 pack-year smoking history. She has never used smokeless tobacco. She reports current alcohol use. She reports current drug use. Drug: Marijuana.  No Known Allergies  Family History  Problem Relation Age of  Onset   Lung cancer Father    Cirrhosis Mother    Cirrhosis Brother    Colon cancer Neg Hx    Colon polyps Neg Hx    Esophageal cancer Neg Hx    Rectal cancer Neg Hx    Stomach cancer Neg Hx      Prior to Admission medications   Medication Sig Start Date End Date Taking? Authorizing Provider  azithromycin (ZITHROMAX) 250 MG tablet Take 1 tablet (250 mg total) by mouth daily. Take first 2 tablets together, then 1 every day until finished. 11/30/22  Yes Valarie Merino, MD  predniSONE (DELTASONE) 10 MG tablet Take 2 tablets (20 mg total) by mouth daily. 11/30/22  Yes Valarie Merino, MD  albuterol (VENTOLIN HFA) 108 (90 Base) MCG/ACT inhaler INHALE 2 PUFFS BY MOUTH EVERY 6 HOURS AS NEEDED FOR WHEEZING FOR SHORTNESS OF BREATH 11/28/22   [name], MD  AquaLance Lancets 30G MISC CHECK BLOOD SUGAR 2 TIMES A DAY 03/05/22   [name], MD  Aspirin-Salicylamide-Caffeine (BC HEADACHE POWDER PO) Take 1 packet by mouth as needed. Reported on 01/09/2016    [provider]  Blood Glucose Monitoring Suppl (ACCU-CHEK AVIVA PLUS) w/Device KIT USE AS DIRECTED TO CHECK BLOOD GLUCOSE 1 TO 2 TIMES DAILY 05/12/19   [name], MD  Fluticasone-Umeclidin-Vilant [facility] ELLIPTA) 200-62.5-25 MCG/ACT AEPB INHALE 1 PUFF ONCE DAILY 11/10/22   [name], MD  glucose blood (ACCU-CHEK AVIVA PLUS) test strip USE ONE STRIP TO CHECK GLUCOSE ONCE TO TWICE DAILY 02/15/20   [name], MD  ibuprofen (ADVIL) 800 MG tablet TAKE 1 TABLET BY MOUTH EVERY 8 HOURS AS NEEDED 11/21/22   [name], MD  lisinopril-hydrochlorothiazide (ZESTORETIC) 20-12.5 MG tablet Take 1 tablet by mouth daily. 11/13/22   [name], MD  metformin (FORTAMET) 1000 MG (OSM) 24 hr tablet Take 1 tablet (1,000 mg total) by mouth daily with breakfast. 04/17/22   [name], MD  methocarbamol (ROBAXIN) 500 MG tablet Take 1 tablet (500 mg total) by mouth 2 (two) times daily as needed. Patient not taking: Reported on  09/17/2022 10/08/21   Aundra Dubin, PA-C  montelukast (SINGULAIR) 10 MG tablet Take 1 tablet (10 mg total) by mouth at bedtime. 09/17/22   [name], MD  ondansetron (ZOFRAN) 4 MG tablet Take 1 tablet (4 mg total) by mouth every 6 (six) hours. 10/23/22   Leanord Asal K, DO  ondansetron (ZOFRAN-ODT) 8 MG disintegrating tablet Take 1 tablet (8 mg total) by mouth every 8 (eight) hours as needed for nausea or vomiting. 01/19/22   Peri Jefferson, PA-C  pantoprazole (PROTONIX) 40 MG tablet Take 1 tablet (40 mg total) by mouth daily. 09/17/22   [name], MD    Physical Exam: Vitals:   11/30/22 1430 11/30/22 1445 11/30/22 1448 11/30/22 1500  BP: (!) 155/63 (!) 184/74 (!) 184/74   Pulse: 97 98 98 95  Resp:   (!) 24 18  Temp:    98 F (36.7 C)  TempSrc:      SpO2: 95% 95% 96% 95%  Weight:      Height:        Constitutional: NAD, calm, comfortable Vitals:   11/30/22 1430 11/30/22 1445 11/30/22 1448 11/30/22 1500  BP: (!) 155/63 (!) 184/74 (!) 184/74   Pulse: 97 98 98 95  Resp:   (!) 24 18  Temp:    98 F (36.7 C)  TempSrc:      SpO2: 95% 95% 96% 95%  Weight:      Height:       Eyes: PERRL, lids and conjunctivae normal ENMT: Mucous membranes are moist. Posterior pharynx clear of any exudate or lesions.Normal dentition.  Neck: normal, supple, no masses, no thyromegaly Respiratory: Diminished breathing sound bilaterally, scattered crackles, diffused wheezing, increasing breathing effort no accessory muscle use.  Cardiovascular: Regular rate and rhythm, no murmurs / rubs / gallops. No extremity edema. 2+ pedal pulses. No carotid bruits.  Abdomen: no tenderness, no masses palpated. No hepatosplenomegaly. Bowel sounds positive.  Musculoskeletal: no clubbing / cyanosis. No joint deformity upper and lower extremities. Good ROM, no contractures. Normal muscle tone.  Skin: no rashes, lesions, ulcers. No induration Neurologic: CN 2-12 grossly intact. Sensation intact, DTR  normal. Strength 5/5 in all 4.  Psychiatric: Normal judgment and insight. Alert and oriented x 3. Normal mood.    Labs on Admission: I have personally reviewed following labs and imaging studies  CBC: Recent Labs  Lab 11/30/22 1011 11/30/22 1035  WBC 11.9*  --   NEUTROABS 9.6*  --   HGB 12.4 12.9  HCT 37.7 38.0  MCV 91.3  --   PLT 333  --    Basic Metabolic Panel: Recent Labs  Lab 11/30/22 1011 11/30/22 1035  NA 137 139  K 3.8 3.8  CL 103  --   CO2 24  --   GLUCOSE 183*  --  BUN 12  --   CREATININE 1.14*  --   CALCIUM 9.2  --    GFR: Estimated Creatinine Clearance: 55 mL/min (A) (by C-G formula based on SCr of 1.14 mg/dL (H)). Liver Function Tests: No results for input(s): "AST", "ALT", "ALKPHOS", "BILITOT", "PROT", "ALBUMIN" in the last 168 hours. No results for input(s): "LIPASE", "AMYLASE" in the last 168 hours. No results for input(s): "AMMONIA" in the last 168 hours. Coagulation Profile: No results for input(s): "INR", "PROTIME" in the last 168 hours. Cardiac Enzymes: No results for input(s): "CKTOTAL", "CKMB", "CKMBINDEX", "TROPONINI" in the last 168 hours. BNP (last 3 results) No results for input(s): "PROBNP" in the last 8760 hours. HbA1C: No results for input(s): "HGBA1C" in the last 72 hours. CBG: No results for input(s): "GLUCAP" in the last 168 hours. Lipid Profile: No results for input(s): "CHOL", "HDL", "LDLCALC", "TRIG", "CHOLHDL", "LDLDIRECT" in the last 72 hours. Thyroid Function Tests: No results for input(s): "TSH", "T4TOTAL", "FREET4", "T3FREE", "THYROIDAB" in the last 72 hours. Anemia Panel: No results for input(s): "VITAMINB12", "FOLATE", "FERRITIN", "TIBC", "IRON", "RETICCTPCT" in the last 72 hours. Urine analysis:    Component Value Date/Time   COLORURINE YELLOW 06/25/2018 2120   APPEARANCEUR HAZY (A) 06/25/2018 2120   LABSPEC 1.025 09/18/2020 1619   PHURINE 5.0 06/25/2018 2120   GLUCOSEU NEGATIVE 06/25/2018 2120   HGBUR LARGE (A)  06/25/2018 2120   BILIRUBINUR small (A) 09/18/2020 1619   KETONESUR negative 09/18/2020 [place] 06/25/2018 2120   PROTEINUR trace (A) 09/18/2020 1619   PROTEINUR NEGATIVE 06/25/2018 2120   UROBILINOGEN 0.2 08/11/2011 0709   NITRITE Negative 09/18/2020 1619   NITRITE POSITIVE (A) 06/25/2018 2120   LEUKOCYTESUR Negative 09/18/2020 1619    Radiological Exams on Admission: DG Chest Port 1 View  Result Date: 11/30/2022 CLINICAL DATA:  Shortness of breath.  Cough.  Asthma. EXAM: PORTABLE CHEST 1 VIEW COMPARISON:  01/14/2016 FINDINGS: The heart size and mediastinal contours are within normal limits. Mild scarring again seen in the left lower lobe. No evidence of acute infiltrate or edema. No evidence of pleural effusion. IMPRESSION: Stable mild left lung scarring.  No active disease. Electronically Signed   By: Marlaine Hind M.D.   On: 11/30/2022 10:32    EKG: Independently reviewed.  Sinus, no acute ST changes.  Assessment/Plan Principal Problem:   COPD (chronic obstructive pulmonary disease) (HCC) Active Problems:   COPD with acute exacerbation (HCC)   RSV (respiratory syncytial virus pneumonia)   Acute respiratory failure with hypoxia ([place])  (please populate well all problems here in Problem List. (For example, if patient is on BP meds at home and you resume or decide to hold them, it is a problem that needs to be her. Same for CAD, COPD, HLD and so on)  Acute hypoxic respiratory failure -Secondary to acute asthma/COPD exacerbation, likely triggered by RSV infection -Treat COPD as below  Acute COPD exacerbation -Continue IV Solu-Medrol for today and switch to p.o. prednisone tomorrow for 5 days -Continue LABA and ICS -DuoNeb every 6 hours -Supportive care with incentive spirometry and flutter valve -RSV positive, and symptoms compatible with RSV URI, hold off antibiotics.  HTN -Continue HCTZ plus lisinopril  IIDM -Continue metformin -Add sliding scale  DVT  prophylaxis: Lovenox Code Status: Full code Family Communication: None at bedside Disposition Plan: Expect less than 2 midnight hospital stay Consults called: None Admission status: Tele observation   Lequita Halt MD [facility] Pager [phone]  11/30/2022, 4:42 PM

## 2022-11-30 NOTE — ED Triage Notes (Signed)
Pt BIB EMS from home, pt reports feeling sick on Thursday. Pt has been using frequent nebulizers and inhalers. Pt reports increased shortness of breath with EMS arrival. Pt has had two duonebs with EMS. 85% on RA. Increased t0o 96% with oxygen. Diminished lung sounds wiith wheezes. 125 of solumedrol. Productive cough with clear sputum.   EMS Vitals 190/65 98%  98 HR RR 24

## 2022-11-30 NOTE — Discharge Instructions (Addendum)
Return for any problem.  ?

## 2022-12-01 DIAGNOSIS — E1122 Type 2 diabetes mellitus with diabetic chronic kidney disease: Secondary | ICD-10-CM | POA: Diagnosis present

## 2022-12-01 DIAGNOSIS — E669 Obesity, unspecified: Secondary | ICD-10-CM | POA: Diagnosis present

## 2022-12-01 DIAGNOSIS — Z801 Family history of malignant neoplasm of trachea, bronchus and lung: Secondary | ICD-10-CM | POA: Diagnosis not present

## 2022-12-01 DIAGNOSIS — Z1152 Encounter for screening for COVID-19: Secondary | ICD-10-CM | POA: Diagnosis not present

## 2022-12-01 DIAGNOSIS — J440 Chronic obstructive pulmonary disease with acute lower respiratory infection: Secondary | ICD-10-CM | POA: Diagnosis not present

## 2022-12-01 DIAGNOSIS — Z87891 Personal history of nicotine dependence: Secondary | ICD-10-CM | POA: Diagnosis not present

## 2022-12-01 DIAGNOSIS — J45901 Unspecified asthma with (acute) exacerbation: Secondary | ICD-10-CM | POA: Diagnosis present

## 2022-12-01 DIAGNOSIS — N1831 Chronic kidney disease, stage 3a: Secondary | ICD-10-CM | POA: Diagnosis not present

## 2022-12-01 DIAGNOSIS — E1165 Type 2 diabetes mellitus with hyperglycemia: Secondary | ICD-10-CM | POA: Diagnosis present

## 2022-12-01 DIAGNOSIS — R531 Weakness: Secondary | ICD-10-CM | POA: Diagnosis not present

## 2022-12-01 DIAGNOSIS — J449 Chronic obstructive pulmonary disease, unspecified: Secondary | ICD-10-CM | POA: Diagnosis not present

## 2022-12-01 DIAGNOSIS — M549 Dorsalgia, unspecified: Secondary | ICD-10-CM | POA: Diagnosis present

## 2022-12-01 DIAGNOSIS — R0602 Shortness of breath: Secondary | ICD-10-CM | POA: Diagnosis not present

## 2022-12-01 DIAGNOSIS — J121 Respiratory syncytial virus pneumonia: Secondary | ICD-10-CM

## 2022-12-01 DIAGNOSIS — J9601 Acute respiratory failure with hypoxia: Secondary | ICD-10-CM | POA: Diagnosis not present

## 2022-12-01 DIAGNOSIS — J441 Chronic obstructive pulmonary disease with (acute) exacerbation: Secondary | ICD-10-CM

## 2022-12-01 DIAGNOSIS — I129 Hypertensive chronic kidney disease with stage 1 through stage 4 chronic kidney disease, or unspecified chronic kidney disease: Secondary | ICD-10-CM | POA: Diagnosis present

## 2022-12-01 DIAGNOSIS — E872 Acidosis, unspecified: Secondary | ICD-10-CM | POA: Diagnosis present

## 2022-12-01 DIAGNOSIS — N179 Acute kidney failure, unspecified: Secondary | ICD-10-CM | POA: Diagnosis not present

## 2022-12-01 DIAGNOSIS — R7401 Elevation of levels of liver transaminase levels: Secondary | ICD-10-CM | POA: Diagnosis present

## 2022-12-01 DIAGNOSIS — K219 Gastro-esophageal reflux disease without esophagitis: Secondary | ICD-10-CM | POA: Diagnosis present

## 2022-12-01 DIAGNOSIS — T380X5A Adverse effect of glucocorticoids and synthetic analogues, initial encounter: Secondary | ICD-10-CM | POA: Diagnosis present

## 2022-12-01 DIAGNOSIS — K5900 Constipation, unspecified: Secondary | ICD-10-CM | POA: Diagnosis present

## 2022-12-01 DIAGNOSIS — G8929 Other chronic pain: Secondary | ICD-10-CM | POA: Diagnosis present

## 2022-12-01 DIAGNOSIS — Z6833 Body mass index (BMI) 33.0-33.9, adult: Secondary | ICD-10-CM | POA: Diagnosis not present

## 2022-12-01 DIAGNOSIS — K047 Periapical abscess without sinus: Secondary | ICD-10-CM | POA: Diagnosis present

## 2022-12-01 LAB — CBC WITH DIFFERENTIAL/PLATELET
Abs Immature Granulocytes: 0.06 10*3/uL (ref 0.00–0.07)
Basophils Absolute: 0 10*3/uL (ref 0.0–0.1)
Basophils Relative: 0 %
Eosinophils Absolute: 0 10*3/uL (ref 0.0–0.5)
Eosinophils Relative: 0 %
HCT: 43.3 % (ref 36.0–46.0)
Hemoglobin: 13.9 g/dL (ref 12.0–15.0)
Immature Granulocytes: 0 %
Lymphocytes Relative: 5 %
Lymphs Abs: 0.8 10*3/uL (ref 0.7–4.0)
MCH: 29.5 pg (ref 26.0–34.0)
MCHC: 32.1 g/dL (ref 30.0–36.0)
MCV: 91.9 fL (ref 80.0–100.0)
Monocytes Absolute: 0.6 10*3/uL (ref 0.1–1.0)
Monocytes Relative: 4 %
Neutro Abs: 15.1 10*3/uL — ABNORMAL HIGH (ref 1.7–7.7)
Neutrophils Relative %: 91 %
Platelets: 381 10*3/uL (ref 150–400)
RBC: 4.71 MIL/uL (ref 3.87–5.11)
RDW: 13 % (ref 11.5–15.5)
WBC: 16.6 10*3/uL — ABNORMAL HIGH (ref 4.0–10.5)
nRBC: 0 % (ref 0.0–0.2)

## 2022-12-01 LAB — COMPREHENSIVE METABOLIC PANEL
ALT: 33 U/L (ref 0–44)
AST: 42 U/L — ABNORMAL HIGH (ref 15–41)
Albumin: 3.7 g/dL (ref 3.5–5.0)
Alkaline Phosphatase: 61 U/L (ref 38–126)
Anion gap: 12 (ref 5–15)
BUN: 30 mg/dL — ABNORMAL HIGH (ref 8–23)
CO2: 23 mmol/L (ref 22–32)
Calcium: 9.2 mg/dL (ref 8.9–10.3)
Chloride: 101 mmol/L (ref 98–111)
Creatinine, Ser: 1.39 mg/dL — ABNORMAL HIGH (ref 0.44–1.00)
GFR, Estimated: 41 mL/min — ABNORMAL LOW (ref >60)
Glucose, Bld: 148 mg/dL — ABNORMAL HIGH (ref 70–99)
Potassium: 4.3 mmol/L (ref 3.5–5.1)
Sodium: 136 mmol/L (ref 135–145)
Total Bilirubin: 0.5 mg/dL (ref 0.3–1.2)
Total Protein: 8 g/dL (ref 6.5–8.1)

## 2022-12-01 LAB — CBG MONITORING, ED
Glucose-Capillary: 172 mg/dL — ABNORMAL HIGH (ref 70–99)
Glucose-Capillary: 136 mg/dL — ABNORMAL HIGH (ref 70–99)
Glucose-Capillary: 159 mg/dL — ABNORMAL HIGH (ref 70–99)

## 2022-12-01 LAB — MAGNESIUM: Magnesium: 2.1 mg/dL (ref 1.7–2.4)

## 2022-12-01 LAB — PHOSPHORUS: Phosphorus: 4.9 mg/dL — ABNORMAL HIGH (ref 2.5–4.6)

## 2022-12-01 MED ORDER — IPRATROPIUM BROMIDE 0.02 % IN SOLN
0.5000 mg | Freq: Four times a day (QID) | RESPIRATORY_TRACT | Status: DC
  Administered 2022-12-01 (×2): 0.5 mg via RESPIRATORY_TRACT
  Filled 2022-12-01 (×2): qty 2.5

## 2022-12-01 MED ORDER — METHYLPREDNISOLONE SODIUM SUCC 125 MG IJ SOLR
60.0000 mg | Freq: Two times a day (BID) | INTRAMUSCULAR | Status: DC
  Administered 2022-12-01 – 2022-12-04 (×6): 60 mg via INTRAVENOUS
  Filled 2022-12-01: qty 0.96
  Filled 2022-12-01 (×7): qty 2

## 2022-12-01 MED ORDER — DOXYCYCLINE HYCLATE 100 MG PO TABS
100.0000 mg | ORAL_TABLET | Freq: Two times a day (BID) | ORAL | Status: DC
  Administered 2022-12-01 – 2022-12-04 (×6): 100 mg via ORAL
  Filled 2022-12-01 (×6): qty 1

## 2022-12-01 MED ORDER — BUDESONIDE 0.5 MG/2ML IN SUSP
0.5000 mg | Freq: Two times a day (BID) | RESPIRATORY_TRACT | Status: DC
  Administered 2022-12-01 – 2022-12-04 (×6): 0.5 mg via RESPIRATORY_TRACT
  Filled 2022-12-01 (×6): qty 2

## 2022-12-01 MED ORDER — LEVALBUTEROL HCL 0.63 MG/3ML IN NEBU
0.6300 mg | INHALATION_SOLUTION | RESPIRATORY_TRACT | Status: DC
  Administered 2022-12-02 – 2022-12-04 (×14): 0.63 mg via RESPIRATORY_TRACT
  Filled 2022-12-01 (×16): qty 3

## 2022-12-01 MED ORDER — ARFORMOTEROL TARTRATE 15 MCG/2ML IN NEBU
15.0000 ug | INHALATION_SOLUTION | Freq: Two times a day (BID) | RESPIRATORY_TRACT | Status: DC
  Administered 2022-12-01 – 2022-12-04 (×6): 15 ug via RESPIRATORY_TRACT
  Filled 2022-12-01 (×6): qty 2

## 2022-12-01 MED ORDER — IPRATROPIUM BROMIDE 0.02 % IN SOLN
0.5000 mg | RESPIRATORY_TRACT | Status: DC
  Administered 2022-12-02 – 2022-12-04 (×14): 0.5 mg via RESPIRATORY_TRACT
  Filled 2022-12-01 (×18): qty 2.5

## 2022-12-01 MED ORDER — LEVALBUTEROL HCL 0.63 MG/3ML IN NEBU
0.6300 mg | INHALATION_SOLUTION | Freq: Four times a day (QID) | RESPIRATORY_TRACT | Status: DC
  Administered 2022-12-01 (×2): 0.63 mg via RESPIRATORY_TRACT
  Filled 2022-12-01 (×2): qty 3

## 2022-12-01 NOTE — Progress Notes (Signed)
PROGRESS NOTE    [name]  LZJ:[id number] DOB: January 03, 1956 DOA: 11/30/2022 PCP: [name], MD   Brief Narrative:  HPI per Dr. Wynetta Fines on 11/30/22 [name] is a 67 y.o. female with medical history significant of COPD Gold stage II, HTN, chronic back pain, presented with worsening of cough, congestion,, shortness of breath.   Symptoms started 4 days ago, patient started to have nasal congestion, then developed a productive cough with clear phlegm, and started to have wheezing and increasing shortness of breath.  No fever or chills and symptoms getting worse since yesterday.  Called EMS, arrived and found patient O2 sat reading in the 70s patient was given IV Solu-Medrol 125 mg x 1 and 2 breathing treatment en route, no significant improvement of symptoms on arrival.  In the ED, patient workup benign chest x-ray no acute infiltrates, respiratory panel came back with RSV positive COVID and flu negative.  **Interim History    Assessment and Plan: No notes have been filed under this hospital service. Service: Hospitalist  Acute Hypoxic Respiratory Failure in the setting of RSV and Acute Exacerbation of COPD -Secondary to acute asthma/COPD exacerbation, likely triggered by RSV infection -SpO2: 99 % O2 Flow Rate (L/min): 3 L/min -Continuous Pulse Oximetry and Maintain O2 Saturations >90% -Continue Supplemental O2 via [place] and [place] O2 as Tolerated -Will need an Ambulatory Home O2 Screen prior to D/C -Treat COPD as below   Acute COPD Exacerbation -Initially started  IV Solu-Medrol and changed back from prednisone to IV Solu-Medrol 60 mg q12h -Hold Breo-Ellipta and start Budesonide 0.5 mg Neb q12h and Arformoterol 15 mcg Neb BID -C/w Guaifenesin 1200 mg po BID, Incentive Spirometry, and Flutter valve  -DuoNeb every 6 hours changed to Xopenex and Atrovent -Supportive care  -RSV positive, and symptoms compatible with RSV URI, hold off antibiotics. -Will add  Doxycycline 100 mg po BID -Repeat CXR in the AM    HTN -Hold HCTZ plus lisinopril given renal elevation -C/w Hydralazine 25 mg po q6hprn SBP >150 or DBP>100 -Continue to Monitor BP per Protocol -Last BP reading is 145/91  AKI on CKD Stage 3a -BUN/Cr Trend: Recent Labs  Lab 11/30/22 1011 12/01/22 1216  BUN 12 30*  CREATININE 1.14* 1.39*  -Avoid Nephrotoxic Medications, Contrast Dyes, Hypotension and Dehydration to Ensure Adequate Renal Perfusion and will need to Renally Adjust Meds -Continue to Monitor and Trend Renal Function carefully and repeat CMP in the AM   Leukocytosis -WBC Trend: Recent Labs  Lab 11/30/22 1011 12/01/22 1216  WBC 11.9* 16.6*  -In the setting of Steroid Demargination -Continue to Monitor and Trend -Repeat CBC in the AM   IIDM -Hold Metformin -Continue Moderate Novolog SSI AC -CBGs ranging from 136-172  Tooth Infection -C/w Penicillin V -C/w Pain control and has been taking Ibuprofen 800 mg po q8hprn but may need to hold   GERD/GI Prophylaxis -C/w Pantoprazole 40 mg po Daily   Obesity -Complicates overall prognosis and care -Estimated body mass index is 33.89 kg/m as calculated from the following:   Height as of this encounter: '5\' 6"'$  (1.676 m).   Weight as of this encounter: 95.3 kg.  -Weight Loss and Dietary Counseling given  DVT prophylaxis: enoxaparin (LOVENOX) injection 40 mg Start: 11/30/22 1600    Code Status: Full Code Family Communication: No family present at bedside   Disposition Plan:  Level of care: Telemetry Medical Status is: Inpatient Remains inpatient appropriate because: Continues to be very dyspneic and  needs further clinical improvement prior to safe discharge disposition   Consultants:  None  Procedures:  As delineated as above  Antimicrobials:  Anti-infectives (From admission, onward)    Start     Dose/Rate Route Frequency Ordered Stop   11/30/22 2000  penicillin v potassium (VEETID) tablet 500 mg         500 mg Oral Every 6 hours 11/30/22 1943     11/30/22 0000  azithromycin (ZITHROMAX) 250 MG tablet        250 mg Oral Daily 11/30/22 1413         Subjective: Seen and examined at bedside and she is very dyspneic and states that there is issues with her nebulizer breathing apparatus and states that the mouthpiece is too long and wanted a shorter 1.  Also states that she got very short of breath and Call for help but no one cared for help from that she decided to take her own albuterol inhaler.  She denies any lightheadedness or dizziness but was very short of breath.  No other concerns or complaints at this time.  Objective: Vitals:   12/01/22 1152 12/01/22 1553 12/01/22 1800 12/01/22 1803  BP:    (!) 145/91  Pulse:   (!) 102   Resp:    18  Temp: 98.9 F (37.2 C) (!) 97.3 F (36.3 C)    TempSrc:      SpO2:    99%  Weight:      Height:       No intake or output data in the 24 hours ending 12/01/22 1805 Filed Weights   11/30/22 1009  Weight: 95.3 kg   Examination: Physical Exam:  Constitutional: WN/WD obese African-American female currently no acute distress Respiratory: Diminished to auscultation bilaterally with coarse breath sounds and has some expiratory wheezing and rhonchi.  No appreciable crackles or rales.  She has increased respiratory effort and rate and wearing supplemental oxygen via nasal cannula Cardiovascular: RRR, no murmurs / rubs / gallops. S1 and S2 auscultated. Trace extremity edema Abdomen: Soft, non-tender, distended secondary to body habitus bowel sounds positive.  GU: Deferred. Musculoskeletal: No clubbing / cyanosis of digits/nails. No joint deformity upper and lower extremities.  Skin: No rashes, lesions, ulcers on limited skin evaluation. No induration; Warm and dry.  Neurologic: CN 2-12 grossly intact with no focal deficits. \ Romberg sign and cerebellar reflexes not assessed.  Psychiatric: Normal judgment and insight. Alert and oriented x 3. Normal mood  and appropriate affect.   Data Reviewed: I have personally reviewed following labs and imaging studies  CBC: Recent Labs  Lab 11/30/22 1011 11/30/22 1035 12/01/22 1216  WBC 11.9*  --  16.6*  NEUTROABS 9.6*  --  15.1*  HGB 12.4 12.9 13.9  HCT 37.7 38.0 43.3  MCV 91.3  --  91.9  PLT 333  --  937   Basic Metabolic Panel: Recent Labs  Lab 11/30/22 1011 11/30/22 1035 12/01/22 1216  NA 137 139 136  K 3.8 3.8 4.3  CL 103  --  101  CO2 24  --  23  GLUCOSE 183*  --  148*  BUN 12  --  30*  CREATININE 1.14*  --  1.39*  CALCIUM 9.2  --  9.2  MG  --   --  2.1  PHOS  --   --  4.9*   GFR: Estimated Creatinine Clearance: 45.1 mL/min (A) (by C-G formula based on SCr of 1.39 mg/dL (H)). Liver Function Tests: Recent Labs  Lab 12/01/22 1216  AST 42*  ALT 33  ALKPHOS 61  BILITOT 0.5  PROT 8.0  ALBUMIN 3.7   No results for input(s): "LIPASE", "AMYLASE" in the last 168 hours. No results for input(s): "AMMONIA" in the last 168 hours. Coagulation Profile: No results for input(s): "INR", "PROTIME" in the last 168 hours. Cardiac Enzymes: No results for input(s): "CKTOTAL", "CKMB", "CKMBINDEX", "TROPONINI" in the last 168 hours. BNP (last 3 results) No results for input(s): "PROBNP" in the last 8760 hours. HbA1C: No results for input(s): "HGBA1C" in the last 72 hours. CBG: Recent Labs  Lab 11/30/22 1727 12/01/22 0822 12/01/22 1149 12/01/22 1700  GLUCAP 240* 172* 136* 159*   Lipid Profile: No results for input(s): "CHOL", "HDL", "LDLCALC", "TRIG", "CHOLHDL", "LDLDIRECT" in the last 72 hours. Thyroid Function Tests: No results for input(s): "TSH", "T4TOTAL", "FREET4", "T3FREE", "THYROIDAB" in the last 72 hours. Anemia Panel: No results for input(s): "VITAMINB12", "FOLATE", "FERRITIN", "TIBC", "IRON", "RETICCTPCT" in the last 72 hours. Sepsis Labs: No results for input(s): "PROCALCITON", "LATICACIDVEN" in the last 168 hours.  Recent Results (from the past 240 hour(s))   Resp panel by RT-PCR (RSV, Flu A&B, Covid) Anterior Nasal Swab     Status: Abnormal   Collection Time: 11/30/22 10:11 AM   Specimen: Anterior Nasal Swab  Result Value Ref Range Status   SARS Coronavirus 2 by RT PCR NEGATIVE NEGATIVE Final    Comment: (NOTE) SARS-CoV-2 target nucleic acids are NOT DETECTED.  The SARS-CoV-2 RNA is generally detectable in upper respiratory specimens during the acute phase of infection. The lowest concentration of SARS-CoV-2 viral copies this assay can detect is 138 copies/mL. A negative result does not preclude SARS-Cov-2 infection and should not be used as the sole basis for treatment or other patient management decisions. A negative result may occur with  improper specimen collection/handling, submission of specimen other than nasopharyngeal swab, presence of viral mutation(s) within the areas targeted by this assay, and inadequate number of viral copies(<138 copies/mL). A negative result must be combined with clinical observations, patient history, and epidemiological information. The expected result is Negative.  Fact Sheet for Patients:  EntrepreneurPulse.com.au  Fact Sheet for Healthcare Providers:  IncredibleEmployment.be  This test is no t yet approved or cleared by the [place] FDA and  has been authorized for detection and/or diagnosis of SARS-CoV-2 by FDA under an Emergency Use Authorization (EUA). This EUA will remain  in effect (meaning this test can be used) for the duration of the COVID-19 declaration under Section 564(b)(1) of the Act, 21 U.S.C.section 360bbb-3(b)(1), unless the authorization is terminated  or revoked sooner.       Influenza A by PCR NEGATIVE NEGATIVE Final   Influenza B by PCR NEGATIVE NEGATIVE Final    Comment: (NOTE) The Xpert Xpress SARS-CoV-2/FLU/RSV plus assay is intended as an aid in the diagnosis of influenza from Nasopharyngeal swab specimens and should not be  used as a sole basis for treatment. Nasal washings and aspirates are unacceptable for Xpert Xpress SARS-CoV-2/FLU/RSV testing.  Fact Sheet for Patients: EntrepreneurPulse.com.au  Fact Sheet for Healthcare Providers: IncredibleEmployment.be  This test is not yet approved or cleared by the [place] FDA and has been authorized for detection and/or diagnosis of SARS-CoV-2 by FDA under an Emergency Use Authorization (EUA). This EUA will remain in effect (meaning this test can be used) for the duration of the COVID-19 declaration under Section 564(b)(1) of the Act, 21 U.S.C. section 360bbb-3(b)(1), unless the authorization is terminated or revoked.  Resp Syncytial Virus by PCR POSITIVE (A) NEGATIVE Final    Comment: (NOTE) Fact Sheet for Patients: EntrepreneurPulse.com.au  Fact Sheet for Healthcare Providers: IncredibleEmployment.be  This test is not yet approved or cleared by the [place] FDA and has been authorized for detection and/or diagnosis of SARS-CoV-2 by FDA under an Emergency Use Authorization (EUA). This EUA will remain in effect (meaning this test can be used) for the duration of the COVID-19 declaration under Section 564(b)(1) of the Act, 21 U.S.C. section 360bbb-3(b)(1), unless the authorization is terminated or revoked.  Performed at [facility], [place] [address] [place], [place] [address]     Radiology Studies: DG Chest Port 1 View  Result Date: 11/30/2022 CLINICAL DATA:  Shortness of breath.  Cough.  Asthma. EXAM: PORTABLE CHEST 1 VIEW COMPARISON:  01/14/2016 FINDINGS: The heart size and mediastinal contours are within normal limits. Mild scarring again seen in the left lower lobe. No evidence of acute infiltrate or edema. No evidence of pleural effusion. IMPRESSION: Stable mild left lung scarring.  No active disease. Electronically Signed   By: Marlaine Hind M.D.   On:  11/30/2022 10:32    Scheduled Meds:  budesonide (PULMICORT) nebulizer solution  2 mg Nebulization Q12H   carvedilol  3.125 mg Oral BID WC   enoxaparin (LOVENOX) injection  40 mg Subcutaneous Q24H   fluticasone furoate-vilanterol  1 puff Inhalation Daily   guaiFENesin  1,200 mg Oral BID   lisinopril  20 mg Oral Daily   And   hydrochlorothiazide  12.5 mg Oral Daily   insulin aspart  0-15 Units Subcutaneous TID WC   ipratropium  0.5 mg Nebulization Q6H   levalbuterol  0.63 mg Nebulization Q6H   methylPREDNISolone (SOLU-MEDROL) injection  60 mg Intravenous Q12H   montelukast  10 mg Oral QHS   ondansetron  4 mg Oral Q6H   pantoprazole  40 mg Oral Daily   penicillin v potassium  500 mg Oral Q6H   Continuous Infusions:  sodium chloride      LOS: 0 days   Raiford Noble, DO [facility] Available via Epic secure chat 7am-7pm After these hours, please refer to coverage provider listed on [email or web address] 12/01/2022, 6:05 PM

## 2022-12-01 NOTE — ED Notes (Signed)
ED TO INPATIENT HANDOFF REPORT  ED Nurse Name and Phone #: Philip Aspen Name/Age/Gender [name] 67 y.o. female Room/Bed: 004C/004C  Code Status   Code Status: Full Code  Home/SNF/Other Home Patient oriented to: self, place, time, and situation Is this baseline? Yes   Triage Complete: Triage complete  Chief Complaint COPD (chronic obstructive pulmonary disease) ([place]) [J44.9]  Triage Note Pt BIB EMS from home, pt reports feeling sick on Thursday. Pt has been using frequent nebulizers and inhalers. Pt reports increased shortness of breath with EMS arrival. Pt has had two duonebs with EMS. 85% on RA. Increased t0o 96% with oxygen. Diminished lung sounds wiith wheezes. 125 of solumedrol. Productive cough with clear sputum.   EMS Vitals 190/65 98%  98 HR RR 24   Allergies No Known Allergies  Level of Care/Admitting Diagnosis ED Disposition     ED Disposition  Admit   Condition  --   Comment  Hospital Area: [facility] [[id number]]  Level of Care: Telemetry Medical [104]  May admit patient to [facility] or [facility] if equivalent level of care is available:: No  Covid Evaluation: Confirmed COVID Negative  Diagnosis: COPD (chronic obstructive pulmonary disease) [facility]) [[id number]]  Admitting Physician: Lequita Halt [[id number]]  Attending Physician: Raiford Noble LATIF [[id number]]  Certification:: I certify this patient will need inpatient services for at least 2 midnights  Estimated Length of Stay: 2          B Medical/Surgery History Past Medical History:  Diagnosis Date   Arthritis    Asthma    Colonic polyp    GERD (gastroesophageal reflux disease)    Hypertension    Obesity    Pain in limb    Smoker    Swelling of limb    Past Surgical History:  Procedure Laterality Date   BIOPSY BREAST Left 09/22/2018   no maglignancy   COLONOSCOPY  2009   Dr. Henrene Pastor   POLYPECTOMY       A IV Location/Drains/Wounds Patient  Lines/Drains/Airways Status     Active Line/Drains/Airways     Name Placement date Placement time Site Days   Peripheral IV 11/30/22 18 G Anterior;Left;Proximal Forearm 11/30/22  1030  Forearm  1            Intake/Output Last 24 hours No intake or output data in the 24 hours ending 12/01/22 1742  Labs/Imaging Results for orders placed or performed during the hospital encounter of 11/30/22 (from the past 48 hour(s))  Resp panel by RT-PCR (RSV, Flu A&B, Covid) Anterior Nasal Swab     Status: Abnormal   Collection Time: 11/30/22 10:11 AM   Specimen: Anterior Nasal Swab  Result Value Ref Range   SARS Coronavirus 2 by RT PCR NEGATIVE NEGATIVE    Comment: (NOTE) SARS-CoV-2 target nucleic acids are NOT DETECTED.  The SARS-CoV-2 RNA is generally detectable in upper respiratory specimens during the acute phase of infection. The lowest concentration of SARS-CoV-2 viral copies this assay can detect is 138 copies/mL. A negative result does not preclude SARS-Cov-2 infection and should not be used as the sole basis for treatment or other patient management decisions. A negative result may occur with  improper specimen collection/handling, submission of specimen other than nasopharyngeal swab, presence of viral mutation(s) within the areas targeted by this assay, and inadequate number of viral copies(<138 copies/mL). A negative result must be combined with clinical observations, patient history, and epidemiological information. The expected result is Negative.  Fact Sheet for Patients:  EntrepreneurPulse.com.au  Fact Sheet for Healthcare Providers:  IncredibleEmployment.be  This test is no t yet approved or cleared by the [place] FDA and  has been authorized for detection and/or diagnosis of SARS-CoV-2 by FDA under an Emergency Use Authorization (EUA). This EUA will remain  in effect (meaning this test can be used) for the duration of  the COVID-19 declaration under Section 564(b)(1) of the Act, 21 U.S.C.section 360bbb-3(b)(1), unless the authorization is terminated  or revoked sooner.       Influenza A by PCR NEGATIVE NEGATIVE   Influenza B by PCR NEGATIVE NEGATIVE    Comment: (NOTE) The Xpert Xpress SARS-CoV-2/FLU/RSV plus assay is intended as an aid in the diagnosis of influenza from Nasopharyngeal swab specimens and should not be used as a sole basis for treatment. Nasal washings and aspirates are unacceptable for Xpert Xpress SARS-CoV-2/FLU/RSV testing.  Fact Sheet for Patients: EntrepreneurPulse.com.au  Fact Sheet for Healthcare Providers: IncredibleEmployment.be  This test is not yet approved or cleared by the [place] FDA and has been authorized for detection and/or diagnosis of SARS-CoV-2 by FDA under an Emergency Use Authorization (EUA). This EUA will remain in effect (meaning this test can be used) for the duration of the COVID-19 declaration under Section 564(b)(1) of the Act, 21 U.S.C. section 360bbb-3(b)(1), unless the authorization is terminated or revoked.     Resp Syncytial Virus by PCR POSITIVE (A) NEGATIVE    Comment: (NOTE) Fact Sheet for Patients: EntrepreneurPulse.com.au  Fact Sheet for Healthcare Providers: IncredibleEmployment.be  This test is not yet approved or cleared by the [place] FDA and has been authorized for detection and/or diagnosis of SARS-CoV-2 by FDA under an Emergency Use Authorization (EUA). This EUA will remain in effect (meaning this test can be used) for the duration of the COVID-19 declaration under Section 564(b)(1) of the Act, 21 U.S.C. section 360bbb-3(b)(1), unless the authorization is terminated or revoked.  Performed at [facility], [place] [address] [place], [place] [address]   CBC with Differential     Status: Abnormal   Collection Time: 11/30/22 10:11 AM   Result Value Ref Range   WBC 11.9 (H) 4.0 - 10.5 K/uL   RBC 4.13 3.87 - 5.11 MIL/uL   Hemoglobin 12.4 12.0 - 15.0 g/dL   HCT 37.7 36.0 - 46.0 %   MCV 91.3 80.0 - 100.0 fL   MCH 30.0 26.0 - 34.0 pg   MCHC 32.9 30.0 - 36.0 g/dL   RDW 13.2 11.5 - 15.5 %   Platelets 333 150 - 400 K/uL   nRBC 0.0 0.0 - 0.2 %   Neutrophils Relative % 82 %   Neutro Abs 9.6 (H) 1.7 - 7.7 K/uL   Lymphocytes Relative 9 %   Lymphs Abs 1.1 0.7 - 4.0 K/uL   Monocytes Relative 8 %   Monocytes Absolute 0.9 0.1 - 1.0 K/uL   Eosinophils Relative 1 %   Eosinophils Absolute 0.2 0.0 - 0.5 K/uL   Basophils Relative 0 %   Basophils Absolute 0.1 0.0 - 0.1 K/uL   Immature Granulocytes 0 %   Abs Immature Granulocytes 0.04 0.00 - 0.07 K/uL    Comment: Performed at [facility] [address], [place], [place] [address]  Basic metabolic panel     Status: Abnormal   Collection Time: 11/30/22 10:11 AM  Result Value Ref Range   Sodium 137 135 - 145 mmol/L   Potassium 3.8 3.5 - 5.1 mmol/L  Chloride 103 98 - 111 mmol/L   CO2 24 22 - 32 mmol/L   Glucose, Bld 183 (H) 70 - 99 mg/dL    Comment: Glucose reference range applies only to samples taken after fasting for at least 8 hours.   BUN 12 8 - 23 mg/dL   Creatinine, Ser 1.14 (H) 0.44 - 1.00 mg/dL   Calcium 9.2 8.9 - 10.3 mg/dL   GFR, Estimated 52 (L) >60 mL/min    Comment: (NOTE) Calculated using the CKD-EPI Creatinine Equation (2021)    Anion gap 10 5 - 15    Comment: Performed at [facility] [address], [place], [place] [address]  Brain natriuretic peptide     Status: None   Collection Time: 11/30/22 10:11 AM  Result Value Ref Range   B Natriuretic Peptide 43.5 0.0 - 100.0 pg/mL    Comment: Performed at [facility] [address], [place], [place] [address]  Troponin I (High Sensitivity)     Status: Abnormal   Collection Time: 11/30/22 10:11 AM  Result Value Ref Range   Troponin I (High Sensitivity) 19 (H) <18 ng/L    Comment:  (NOTE) Elevated high sensitivity troponin I (hsTnI) values and significant  changes across serial measurements may suggest ACS but many other  chronic and acute conditions are known to elevate hsTnI results.  Refer to the "Links" section for chest pain algorithms and additional  guidance. Performed at [facility], [place] [address] [place], [place] [address]   I-Stat venous blood gas, ED     Status: Abnormal   Collection Time: 11/30/22 10:35 AM  Result Value Ref Range   pH, Ven 7.417 7.25 - 7.43   pCO2, Ven 40.5 (L) 44 - 60 mmHg   pO2, Ven 28 (LL) 32 - 45 mmHg   Bicarbonate 26.1 20.0 - 28.0 mmol/L   TCO2 27 22 - 32 mmol/L   O2 Saturation 54 %   Acid-Base Excess 1.0 0.0 - 2.0 mmol/L   Sodium 139 135 - 145 mmol/L   Potassium 3.8 3.5 - 5.1 mmol/L   Calcium, Ion 1.14 (L) 1.15 - 1.40 mmol/L   HCT 38.0 36.0 - 46.0 %   Hemoglobin 12.9 12.0 - 15.0 g/dL   Sample type VENOUS    Comment NOTIFIED PHYSICIAN   Troponin I (High Sensitivity)     Status: Abnormal   Collection Time: 11/30/22 11:56 AM  Result Value Ref Range   Troponin I (High Sensitivity) 19 (H) <18 ng/L    Comment: (NOTE) Elevated high sensitivity troponin I (hsTnI) values and significant  changes across serial measurements may suggest ACS but many other  chronic and acute conditions are known to elevate hsTnI results.  Refer to the "Links" section for chest pain algorithms and additional  guidance. Performed at [facility], [place] [address], [place], [place] [address]   HIV Antibody (routine testing w rflx)     Status: None   Collection Time: 11/30/22  3:42 PM  Result Value Ref Range   HIV Screen 4th Generation wRfx Non Reactive Non Reactive    Comment: Performed at [facility], [place] [address] [place], [place] [address]  CBG monitoring, ED     Status: Abnormal   Collection Time: 11/30/22  5:27 PM  Result Value Ref Range   Glucose-Capillary 240 (H) 70 - 99 mg/dL    Comment: Glucose reference range  applies only to samples taken after fasting for at least  8 hours.  CBG monitoring, ED     Status: Abnormal   Collection Time: 12/01/22  8:22 AM  Result Value Ref Range   Glucose-Capillary 172 (H) 70 - 99 mg/dL    Comment: Glucose reference range applies only to samples taken after fasting for at least 8 hours.  CBG monitoring, ED     Status: Abnormal   Collection Time: 12/01/22 11:49 AM  Result Value Ref Range   Glucose-Capillary 136 (H) 70 - 99 mg/dL    Comment: Glucose reference range applies only to samples taken after fasting for at least 8 hours.  CBC with Differential/Platelet     Status: Abnormal   Collection Time: 12/01/22 12:16 PM  Result Value Ref Range   WBC 16.6 (H) 4.0 - 10.5 K/uL   RBC 4.71 3.87 - 5.11 MIL/uL   Hemoglobin 13.9 12.0 - 15.0 g/dL   HCT 43.3 36.0 - 46.0 %   MCV 91.9 80.0 - 100.0 fL   MCH 29.5 26.0 - 34.0 pg   MCHC 32.1 30.0 - 36.0 g/dL   RDW 13.0 11.5 - 15.5 %   Platelets 381 150 - 400 K/uL   nRBC 0.0 0.0 - 0.2 %   Neutrophils Relative % 91 %   Neutro Abs 15.1 (H) 1.7 - 7.7 K/uL   Lymphocytes Relative 5 %   Lymphs Abs 0.8 0.7 - 4.0 K/uL   Monocytes Relative 4 %   Monocytes Absolute 0.6 0.1 - 1.0 K/uL   Eosinophils Relative 0 %   Eosinophils Absolute 0.0 0.0 - 0.5 K/uL   Basophils Relative 0 %   Basophils Absolute 0.0 0.0 - 0.1 K/uL   Immature Granulocytes 0 %   Abs Immature Granulocytes 0.06 0.00 - 0.07 K/uL    Comment: Performed at [facility], [address], [place], [place] [address]  Comprehensive metabolic panel     Status: Abnormal   Collection Time: 12/01/22 12:16 PM  Result Value Ref Range   Sodium 136 135 - 145 mmol/L   Potassium 4.3 3.5 - 5.1 mmol/L   Chloride 101 98 - 111 mmol/L   CO2 23 22 - 32 mmol/L   Glucose, Bld 148 (H) 70 - 99 mg/dL    Comment: Glucose reference range applies only to samples taken after fasting for at least 8 hours.   BUN 30 (H) 8 - 23 mg/dL   Creatinine, Ser 1.39 (H) 0.44 - 1.00 mg/dL   Calcium 9.2  8.9 - 10.3 mg/dL   Total Protein 8.0 6.5 - 8.1 g/dL   Albumin 3.7 3.5 - 5.0 g/dL   AST 42 (H) 15 - 41 U/L   ALT 33 0 - 44 U/L   Alkaline Phosphatase 61 38 - 126 U/L   Total Bilirubin 0.5 0.3 - 1.2 mg/dL   GFR, Estimated 41 (L) >60 mL/min    Comment: (NOTE) Calculated using the CKD-EPI Creatinine Equation (2021)    Anion gap 12 5 - 15    Comment: Performed at [facility] [address] [place], [place] [address]  Magnesium     Status: None   Collection Time: 12/01/22 12:16 PM  Result Value Ref Range   Magnesium 2.1 1.7 - 2.4 mg/dL    Comment: Performed at [facility] [address], [place], [place] [address]  Phosphorus     Status: Abnormal   Collection Time: 12/01/22 12:16 PM  Result Value Ref Range   Phosphorus 4.9 (H) 2.5 - 4.6 mg/dL  Comment: Performed at [facility], [place] [address], [place], [place] [address]  CBG monitoring, ED     Status: Abnormal   Collection Time: 12/01/22  5:00 PM  Result Value Ref Range   Glucose-Capillary 159 (H) 70 - 99 mg/dL    Comment: Glucose reference range applies only to samples taken after fasting for at least 8 hours.   DG Chest Port 1 View  Result Date: 11/30/2022 CLINICAL DATA:  Shortness of breath.  Cough.  Asthma. EXAM: PORTABLE CHEST 1 VIEW COMPARISON:  01/14/2016 FINDINGS: The heart size and mediastinal contours are within normal limits. Mild scarring again seen in the left lower lobe. No evidence of acute infiltrate or edema. No evidence of pleural effusion. IMPRESSION: Stable mild left lung scarring.  No active disease. Electronically Signed   By: Marlaine Hind M.D.   On: 11/30/2022 10:32    Pending Labs Unresulted Labs (From admission, onward)    None       Vitals/Pain Today's Vitals   12/01/22 1010 12/01/22 1026 12/01/22 1152 12/01/22 1553  BP:      Pulse: (!) 114 (!) 109    Resp: 19     Temp:   98.9 F (37.2 C) (!) 97.3 F (36.3 C)  TempSrc:      SpO2: (!) 87% 92%    Weight:      Height:       PainSc:        Isolation Precautions No active isolations  Medications Medications  menthol-cetylpyridinium (CEPACOL) lozenge 3 mg (has no administration in time range)  guaiFENesin (MUCINEX) 12 hr tablet 1,200 mg (1,200 mg Oral Given 12/01/22 1015)  ibuprofen (ADVIL) tablet 800 mg (800 mg Oral Given 12/01/22 1541)  ondansetron (ZOFRAN) tablet 4 mg (4 mg Oral Not Given 12/01/22 1545)  ondansetron (ZOFRAN-ODT) disintegrating tablet 8 mg (has no administration in time range)  pantoprazole (PROTONIX) EC tablet 40 mg (40 mg Oral Given 12/01/22 1015)  fluticasone furoate-vilanterol (BREO ELLIPTA) 200-25 MCG/ACT 1 puff (1 puff Inhalation Given 12/01/22 0833)  montelukast (SINGULAIR) tablet 10 mg (10 mg Oral Given 11/30/22 2221)  enoxaparin (LOVENOX) injection 40 mg (40 mg Subcutaneous Given 12/01/22 1541)  budesonide (PULMICORT) nebulizer solution 2 mg (2 mg Nebulization Given 12/01/22 0847)  albuterol (PROVENTIL) (2.5 MG/3ML) 0.083% nebulizer solution 2.5 mg (2.5 mg Nebulization Given 11/30/22 1715)  lisinopril (ZESTRIL) tablet 20 mg (20 mg Oral Given 12/01/22 1017)    And  hydrochlorothiazide (HYDRODIURIL) tablet 12.5 mg (12.5 mg Oral Given 12/01/22 1015)  insulin aspart (novoLOG) injection 0-15 Units (2 Units Subcutaneous Given 12/01/22 1701)  carvedilol (COREG) tablet 3.125 mg (3.125 mg Oral Given 12/01/22 1701)  hydrALAZINE (APRESOLINE) tablet 25 mg (has no administration in time range)  penicillin v potassium (VEETID) tablet 500 mg (500 mg Oral Given 12/01/22 1144)  methylPREDNISolone sodium succinate (SOLU-MEDROL) 125 mg/2 mL injection 60 mg (60 mg Intravenous Given 12/01/22 1144)  levalbuterol (XOPENEX) nebulizer solution 0.63 mg (0.63 mg Nebulization Given 12/01/22 1448)  ipratropium (ATROVENT) nebulizer solution 0.5 mg (0.5 mg Nebulization Given 12/01/22 1448)  ipratropium-albuterol (DUONEB) 0.5-2.5 (3) MG/3ML nebulizer solution 3 mL (3 mLs Nebulization Given 11/30/22 1017)  ipratropium-albuterol (DUONEB)  0.5-2.5 (3) MG/3ML nebulizer solution 3 mL (3 mLs Nebulization Given 11/30/22 1412)  magnesium sulfate IVPB 2 g 50 mL (0 g Intravenous Stopped 11/30/22 1725)  methylPREDNISolone sodium succinate (SOLU-MEDROL) 125 mg/2 mL injection 125 mg (125 mg Intravenous Given 11/30/22 1612)  methylPREDNISolone sodium succinate (SOLU-MEDROL) 40 mg/mL injection 40 mg (  40 mg Intravenous Given 12/01/22 0839)    Mobility walks Low fall risk   Focused Assessments Pulmonary Assessment Handoff:  Lung sounds: Bilateral Breath Sounds: Diminished O2 Device: Nasal Cannula O2 Flow Rate (L/min): 3 L/min    R Recommendations: See Admitting Provider Note  Report given to:   Additional Notes:

## 2022-12-01 NOTE — ED Notes (Signed)
Attempted to help this patient and she was rude and demanding.  Reporting the breathing treatment did not help and she needs another type of nebulizer.  Attempted to give her, her PO AM meds and patient refusing to take them at this time but left them at bedside.  Patient reporting she needed to use the bathroom and brought bedside commode to room.  Patient oxygen level was 87% on RA turned it back up to 3L [place] and sats at 92% she reports it was only reading low bc she was moving her finger which was not the case.  Patient did not have oxygen on and that is why her oxygen was low.  Patient being noncooperative and hostile.  RN left the room.

## 2022-12-02 ENCOUNTER — Inpatient Hospital Stay (HOSPITAL_COMMUNITY): Payer: Medicare Other

## 2022-12-02 DIAGNOSIS — J9601 Acute respiratory failure with hypoxia: Secondary | ICD-10-CM | POA: Diagnosis not present

## 2022-12-02 DIAGNOSIS — N179 Acute kidney failure, unspecified: Secondary | ICD-10-CM

## 2022-12-02 DIAGNOSIS — N1831 Chronic kidney disease, stage 3a: Secondary | ICD-10-CM

## 2022-12-02 DIAGNOSIS — J121 Respiratory syncytial virus pneumonia: Secondary | ICD-10-CM | POA: Diagnosis not present

## 2022-12-02 DIAGNOSIS — J441 Chronic obstructive pulmonary disease with (acute) exacerbation: Secondary | ICD-10-CM | POA: Diagnosis not present

## 2022-12-02 DIAGNOSIS — J440 Chronic obstructive pulmonary disease with acute lower respiratory infection: Secondary | ICD-10-CM | POA: Diagnosis not present

## 2022-12-02 LAB — CBC WITH DIFFERENTIAL/PLATELET
Abs Immature Granulocytes: 0.05 10*3/uL (ref 0.00–0.07)
Basophils Absolute: 0 10*3/uL (ref 0.0–0.1)
Basophils Relative: 0 %
Eosinophils Absolute: 0 10*3/uL (ref 0.0–0.5)
Eosinophils Relative: 0 %
HCT: 39 % (ref 36.0–46.0)
Hemoglobin: 13.2 g/dL (ref 12.0–15.0)
Immature Granulocytes: 0 %
Lymphocytes Relative: 6 %
Lymphs Abs: 1.1 10*3/uL (ref 0.7–4.0)
MCH: 30.4 pg (ref 26.0–34.0)
MCHC: 33.8 g/dL (ref 30.0–36.0)
MCV: 89.9 fL (ref 80.0–100.0)
Monocytes Absolute: 1.1 10*3/uL — ABNORMAL HIGH (ref 0.1–1.0)
Monocytes Relative: 6 %
Neutro Abs: 15.1 10*3/uL — ABNORMAL HIGH (ref 1.7–7.7)
Neutrophils Relative %: 88 %
Platelets: 384 10*3/uL (ref 150–400)
RBC: 4.34 MIL/uL (ref 3.87–5.11)
RDW: 13 % (ref 11.5–15.5)
WBC: 17.3 10*3/uL — ABNORMAL HIGH (ref 4.0–10.5)
nRBC: 0 % (ref 0.0–0.2)

## 2022-12-02 LAB — COMPREHENSIVE METABOLIC PANEL
ALT: 37 U/L (ref 0–44)
AST: 43 U/L — ABNORMAL HIGH (ref 15–41)
Albumin: 3.6 g/dL (ref 3.5–5.0)
Alkaline Phosphatase: 55 U/L (ref 38–126)
Anion gap: 15 (ref 5–15)
BUN: 54 mg/dL — ABNORMAL HIGH (ref 8–23)
CO2: 21 mmol/L — ABNORMAL LOW (ref 22–32)
Calcium: 8.8 mg/dL — ABNORMAL LOW (ref 8.9–10.3)
Chloride: 100 mmol/L (ref 98–111)
Creatinine, Ser: 1.86 mg/dL — ABNORMAL HIGH (ref 0.44–1.00)
GFR, Estimated: 29 mL/min — ABNORMAL LOW (ref >60)
Glucose, Bld: 154 mg/dL — ABNORMAL HIGH (ref 70–99)
Potassium: 4.2 mmol/L (ref 3.5–5.1)
Sodium: 136 mmol/L (ref 135–145)
Total Bilirubin: 0.6 mg/dL (ref 0.3–1.2)
Total Protein: 7.7 g/dL (ref 6.5–8.1)

## 2022-12-02 LAB — GLUCOSE, CAPILLARY
Glucose-Capillary: 135 mg/dL — ABNORMAL HIGH (ref 70–99)
Glucose-Capillary: 96 mg/dL (ref 70–99)
Glucose-Capillary: 202 mg/dL — ABNORMAL HIGH (ref 70–99)
Glucose-Capillary: 128 mg/dL — ABNORMAL HIGH (ref 70–99)

## 2022-12-02 LAB — PHOSPHORUS: Phosphorus: 6.8 mg/dL — ABNORMAL HIGH (ref 2.5–4.6)

## 2022-12-02 LAB — MAGNESIUM: Magnesium: 2.1 mg/dL (ref 1.7–2.4)

## 2022-12-02 MED ORDER — POLYETHYLENE GLYCOL 3350 17 G PO PACK
17.0000 g | PACK | Freq: Two times a day (BID) | ORAL | Status: DC
  Administered 2022-12-02 – 2022-12-04 (×5): 17 g via ORAL
  Filled 2022-12-02 (×5): qty 1

## 2022-12-02 MED ORDER — SENNOSIDES-DOCUSATE SODIUM 8.6-50 MG PO TABS
1.0000 | ORAL_TABLET | Freq: Two times a day (BID) | ORAL | Status: DC
  Administered 2022-12-02 – 2022-12-04 (×5): 1 via ORAL
  Filled 2022-12-02 (×5): qty 1

## 2022-12-02 MED ORDER — ACETAMINOPHEN 325 MG PO TABS: 650.0000 mg | ORAL_TABLET | Freq: Four times a day (QID) | ORAL | Status: DC | PRN

## 2022-12-02 MED ORDER — BISACODYL 10 MG RE SUPP: 10.0000 mg | Freq: Every day | RECTAL | Status: DC | PRN

## 2022-12-02 MED ORDER — HYDRALAZINE HCL 20 MG/ML IJ SOLN: 10.0000 mg | Freq: Four times a day (QID) | INTRAMUSCULAR | Status: DC | PRN

## 2022-12-02 MED ORDER — OXYCODONE HCL 5 MG PO TABS: 5.0000 mg | ORAL_TABLET | Freq: Four times a day (QID) | ORAL | Status: DC | PRN

## 2022-12-02 MED ORDER — SODIUM CHLORIDE 0.9 % IV SOLN: INTRAVENOUS | Status: DC

## 2022-12-02 NOTE — Progress Notes (Signed)
PROGRESS NOTE    [name]  RJJ:[id number] DOB: 27-Jul-1956 DOA: 11/30/2022 PCP: Lisa Lung, MD   Brief Narrative:  HPI per [name] on 11/30/22 [name] is a 67 y.o. female with medical history significant of COPD Gold stage II, HTN, chronic back pain, presented with worsening of cough, congestion,, shortness of breath.   Symptoms started 4 days ago, patient started to have nasal congestion, then developed a productive cough with clear phlegm, and started to have wheezing and increasing shortness of breath.  No fever or chills and symptoms getting worse since yesterday.  Called EMS, arrived and found patient O2 sat reading in the 70s patient was given IV Solu-Medrol 125 mg x 1 and 2 breathing treatment en route, no significant improvement of symptoms on arrival.  In the ED, patient workup benign chest x-ray no acute infiltrates, respiratory panel came back with RSV positive COVID and flu negative.   **Interim History Respiratory status is slowly improving.  Patient feels constipated and will start bowel regimen.  She continues to be short of breath and still not back to baseline.  Renal function unfortunately worsened in the setting of ibuprofen administration so we will start her on gentle IV fluid hydration and monitor renal function carefully.  Assessment and Plan:  Acute Hypoxic Respiratory Failure in the setting of RSV and Acute Exacerbation of COPD -Secondary to acute asthma/COPD exacerbation, likely triggered by RSV infection -SpO2: 90 % O2 Flow Rate (L/min): 3 L/min -Continuous Pulse Oximetry and Maintain O2 Saturations >90% -Continue Supplemental O2 via [name] and Wean O2 as Tolerated -Will need an Ambulatory Home O2 Screen prior to D/C -Treat COPD as below   Acute COPD Exacerbation -Initially started  IV Solu-Medrol and changed back from prednisone to IV Solu-Medrol 60 mg q12h -Hold Breo-Ellipta and start Budesonide 0.5 mg Neb q12h and  Arformoterol 15 mcg Neb BID -C/w Guaifenesin 1200 mg po BID, Incentive Spirometry, and Flutter valve  -DuoNeb every 6 hours changed to Xopenex and Atrovent -Supportive care  -WBC went from 16.6 -> 17.3 -RSV positive, and symptoms compatible with RSV URI, hold off antibiotics. -Will continue Doxycycline 100 mg po BID -Repeat CXR in the AM as today's X-Ray done and showed "Linear bibasilar pulmonary opacities, favored to represent atelectasis and/or scarring."   HTN -Holding HCTZ plus lisinopril given renal elevation -C/w Hydralazine 25 mg po q6hprn SBP >150 or DBP>100 and will add IV Hydralazine 10 mg q6hprn  -Continue to Monitor BP per Protocol -Last BP reading is elevated at 185/76   AKI on CKD Stage 3a Metabolic Acidosis  Hyperphosphatemia -BUN/Cr Trend: Recent Labs  Lab 11/30/22 1011 12/01/22 1216 12/02/22 0657  BUN 12 30* 54*  CREATININE 1.14* 1.39* 1.86*  -Patient has a slight metabolic acidosis with a CO2 of 21, anion gap of 15, chloride level of 100 -Patient's Phos Level went from 4.9 -> 6.8 -Avoid Nephrotoxic Medications, Contrast Dyes, Hypotension and Dehydration to Ensure Adequate Renal Perfusion and will need to Renally Adjust Meds -Stop Ibuprofen and Hold Antihypertensives -Start IVF hydration with NS at 75 mL/hr -Continue to Monitor and Trend Renal Function carefully and repeat CMP in the AM    Leukocytosis -WBC Trend: Recent Labs  Lab 11/30/22 1011 12/01/22 1216 12/02/22 0657  WBC 11.9* 16.6* 17.3*  -In the setting of Steroid Demargination -Continue to Monitor and Trend -Repeat CBC in the AM   IIDM -Hold Metformin -Continue Moderate Novolog SSI AC -CBGs ranging from 96-159  Elevated AST -AST Trend: Recent Labs  Lab 12/01/22 1216 12/02/22 0657  AST 42* 43*  -Continue to Monitor and Trend and if Necessary will obtain a RUQ U/S and an Acute Hepatitis Panel -Continue to Monitor and Trend and Repeat CMP in the AM   Constipation -Start Bowel  Regimen with Senna-Docusate 1 tab po BID, Miralax 17 grams po BID, and Bisacodyl 10 mg RC Dailyprn   Tooth Infection -C/w Penicillin V -C/w Pain control and has been taking Ibuprofen 800 mg po q8hprn but will hold given AKI   GERD/GI Prophylaxis -C/w Pantoprazole 40 mg po Daily    Obesity -Complicates overall prognosis and care -Estimated body mass index is 33.89 kg/m as calculated from the following:   Height as of this encounter: '5\' 6"'$  (1.676 m).   Weight as of this encounter: 95.3 kg.  -Weight Loss and Dietary Counseling given   DVT prophylaxis: enoxaparin (LOVENOX) injection 40 mg Start: 11/30/22 1600    Code Status: Full Code Family Communication: No family currently at bedside  Disposition Plan:  Level of care: Telemetry Medical Status is: Inpatient Remains inpatient appropriate because: Needs further clinical improvement and nausea and AKI.  Will continue to hold nephrotoxic medications and will need an amatory home O2 screen prior to discharge as well as PT and OT to further evaluate and treat   Consultants:  None  Procedures:  As delineated as above  Antimicrobials:  Anti-infectives (From admission, onward)    Start     Dose/Rate Route Frequency Ordered Stop   12/01/22 2200  doxycycline (VIBRA-TABS) tablet 100 mg        100 mg Oral Every 12 hours 12/01/22 1825     11/30/22 2000  penicillin v potassium (VEETID) tablet 500 mg        500 mg Oral Every 6 hours 11/30/22 1943     11/30/22 0000  azithromycin (ZITHROMAX) 250 MG tablet        250 mg Oral Daily 11/30/22 1413         Subjective: Seen and examined at bedside and she states she is doing a little bit better but feels constipated.  Respiratory status is mildly improved but she feels a little bit better today compared to yesterday.  Still wheezing quite a bit.  No nausea or vomiting.  Denies any other concerns or complaints at this time except for constipation.  Objective: Vitals:   12/02/22 0506 12/02/22  0803 12/02/22 0952 12/02/22 1203  BP: (!) 185/80  (!) 185/76   Pulse: 100  100   Resp: (!) 22  18   Temp: 97.6 F (36.4 C)  98.2 F (36.8 C)   TempSrc: Oral  Oral   SpO2: 91% (!) 89% 93% 90%  Weight:      Height:        Intake/Output Summary (Last 24 hours) at 12/02/2022 1623 Last data filed at 12/02/2022 1300 Gross per 24 hour  Intake 240 ml  Output 0 ml  Net 240 ml   Filed Weights   11/30/22 1009  Weight: 95.3 kg   Examination: Physical Exam:  Constitutional: WN/WD obese African-American female currently no acute distress Respiratory: Diminished to auscultation bilaterally with coarse breath sounds with some wheezing and some rhonchi noted.  No appreciable crackles or rales. Normal respiratory effort and patient is not tachypenic. No accessory muscle use.  Wearing supplemental oxygen via nasal cannula Cardiovascular: RRR, no murmurs / rubs / gallops. S1 and S2 auscultated.  Trace extremity edema Abdomen:  Soft, non-tender, distended secondary to body habitus. Bowel sounds positive.  GU: Deferred. Musculoskeletal: No clubbing / cyanosis of digits/nails. No joint deformity upper and lower extremities.  Skin: No rashes, lesions, ulcers on the skin evaluation. No induration; Warm and dry.  Neurologic: CN 2-12 grossly intact with no focal deficits. Romberg sign and cerebellar reflexes not assessed.  Psychiatric: Normal judgment and insight. Alert and oriented x 3. Normal mood and appropriate affect.   Data Reviewed: I have personally reviewed following labs and imaging studies  CBC: Recent Labs  Lab 11/30/22 1011 11/30/22 1035 12/01/22 1216 12/02/22 0657  WBC 11.9*  --  16.6* 17.3*  NEUTROABS 9.6*  --  15.1* 15.1*  HGB 12.4 12.9 13.9 13.2  HCT 37.7 38.0 43.3 39.0  MCV 91.3  --  91.9 89.9  PLT 333  --  381 035   Basic Metabolic Panel: Recent Labs  Lab 11/30/22 1011 11/30/22 1035 12/01/22 1216 12/02/22 0657  NA 137 139 136 136  K 3.8 3.8 4.3 4.2  CL 103  --  101  100  CO2 24  --  23 21*  GLUCOSE 183*  --  148* 154*  BUN 12  --  30* 54*  CREATININE 1.14*  --  1.39* 1.86*  CALCIUM 9.2  --  9.2 8.8*  MG  --   --  2.1 2.1  PHOS  --   --  4.9* 6.8*   GFR: Estimated Creatinine Clearance: 33.7 mL/min (A) (by C-G formula based on SCr of 1.86 mg/dL (H)). Liver Function Tests: Recent Labs  Lab 12/01/22 1216 12/02/22 0657  AST 42* 43*  ALT 33 37  ALKPHOS 61 55  BILITOT 0.5 0.6  PROT 8.0 7.7  ALBUMIN 3.7 3.6   No results for input(s): "LIPASE", "AMYLASE" in the last 168 hours. No results for input(s): "AMMONIA" in the last 168 hours. Coagulation Profile: No results for input(s): "INR", "PROTIME" in the last 168 hours. Cardiac Enzymes: No results for input(s): "CKTOTAL", "CKMB", "CKMBINDEX", "TROPONINI" in the last 168 hours. BNP (last 3 results) No results for input(s): "PROBNP" in the last 8760 hours. HbA1C: No results for input(s): "HGBA1C" in the last 72 hours. CBG: Recent Labs  Lab 12/01/22 0822 12/01/22 1149 12/01/22 1700 12/02/22 0734 12/02/22 1125  GLUCAP 172* 136* 159* 135* 96   Lipid Profile: No results for input(s): "CHOL", "HDL", "LDLCALC", "TRIG", "CHOLHDL", "LDLDIRECT" in the last 72 hours. Thyroid Function Tests: No results for input(s): "TSH", "T4TOTAL", "FREET4", "T3FREE", "THYROIDAB" in the last 72 hours. Anemia Panel: No results for input(s): "VITAMINB12", "FOLATE", "FERRITIN", "TIBC", "IRON", "RETICCTPCT" in the last 72 hours. Sepsis Labs: No results for input(s): "PROCALCITON", "LATICACIDVEN" in the last 168 hours.  Recent Results (from the past 240 hour(s))  Resp panel by RT-PCR (RSV, Flu A&B, Covid) Anterior Nasal Swab     Status: Abnormal   Collection Time: 11/30/22 10:11 AM   Specimen: Anterior Nasal Swab  Result Value Ref Range Status   SARS Coronavirus 2 by RT PCR NEGATIVE NEGATIVE Final    Comment: (NOTE) SARS-CoV-2 target nucleic acids are NOT DETECTED.  The SARS-CoV-2 RNA is generally detectable in  upper respiratory specimens during the acute phase of infection. The lowest concentration of SARS-CoV-2 viral copies this assay can detect is 138 copies/mL. A negative result does not preclude SARS-Cov-2 infection and should not be used as the sole basis for treatment or other patient management decisions. A negative result may occur with  improper specimen collection/handling, submission of specimen other  than nasopharyngeal swab, presence of viral mutation(s) within the areas targeted by this assay, and inadequate number of viral copies(<138 copies/mL). A negative result must be combined with clinical observations, patient history, and epidemiological information. The expected result is Negative.  Fact Sheet for Patients:  EntrepreneurPulse.com.au  Fact Sheet for Healthcare Providers:  IncredibleEmployment.be  This test is no t yet approved or cleared by the [place] FDA and  has been authorized for detection and/or diagnosis of SARS-CoV-2 by FDA under an Emergency Use Authorization (EUA). This EUA will remain  in effect (meaning this test can be used) for the duration of the COVID-19 declaration under Section 564(b)(1) of the Act, 21 U.S.C.section 360bbb-3(b)(1), unless the authorization is terminated  or revoked sooner.       Influenza A by PCR NEGATIVE NEGATIVE Final   Influenza B by PCR NEGATIVE NEGATIVE Final    Comment: (NOTE) The Xpert Xpress SARS-CoV-2/FLU/RSV plus assay is intended as an aid in the diagnosis of influenza from Nasopharyngeal swab specimens and should not be used as a sole basis for treatment. Nasal washings and aspirates are unacceptable for Xpert Xpress SARS-CoV-2/FLU/RSV testing.  Fact Sheet for Patients: EntrepreneurPulse.com.au  Fact Sheet for Healthcare Providers: IncredibleEmployment.be  This test is not yet approved or cleared by the [place] FDA and has been  authorized for detection and/or diagnosis of SARS-CoV-2 by FDA under an Emergency Use Authorization (EUA). This EUA will remain in effect (meaning this test can be used) for the duration of the COVID-19 declaration under Section 564(b)(1) of the Act, 21 U.S.C. section 360bbb-3(b)(1), unless the authorization is terminated or revoked.     Resp Syncytial Virus by PCR POSITIVE (A) NEGATIVE Final    Comment: (NOTE) Fact Sheet for Patients: EntrepreneurPulse.com.au  Fact Sheet for Healthcare Providers: IncredibleEmployment.be  This test is not yet approved or cleared by the [place] FDA and has been authorized for detection and/or diagnosis of SARS-CoV-2 by FDA under an Emergency Use Authorization (EUA). This EUA will remain in effect (meaning this test can be used) for the duration of the COVID-19 declaration under Section 564(b)(1) of the Act, 21 U.S.C. section 360bbb-3(b)(1), unless the authorization is terminated or revoked.  Performed at [facility], [address] [place], [place] [address]     Radiology Studies: DG CHEST PORT 1 VIEW  Result Date: 12/02/2022 CLINICAL DATA:  Shortness of breath EXAM: PORTABLE CHEST 1 VIEW COMPARISON:  CXR 11/30/22 FINDINGS: No pleural effusion. No pneumothorax. Unchanged cardiac and mediastinal contours. Linear bibasilar pulmonary opacities, favored to represent atelectasis. No displaced rib fractures. Visualized upper abdomen is unremarkable. IMPRESSION: Linear bibasilar pulmonary opacities, favored to represent atelectasis and/or scarring. Electronically Signed   By: Marin Roberts M.D.   On: 12/02/2022 08:14    Scheduled Meds:  arformoterol  15 mcg Nebulization BID   budesonide (PULMICORT) nebulizer solution  0.5 mg Nebulization Q12H   carvedilol  3.125 mg Oral BID WC   doxycycline  100 mg Oral Q12H   enoxaparin (LOVENOX) injection  40 mg Subcutaneous Q24H   guaiFENesin  1,200 mg Oral BID    insulin aspart  0-15 Units Subcutaneous TID WC   ipratropium  0.5 mg Nebulization Q4H   levalbuterol  0.63 mg Nebulization Q4H   methylPREDNISolone (SOLU-MEDROL) injection  60 mg Intravenous Q12H   montelukast  10 mg Oral QHS   ondansetron  4 mg Oral Q6H   pantoprazole  40 mg Oral Daily   penicillin v potassium  500 mg  Oral Q6H   polyethylene glycol  17 g Oral BID   senna-docusate  1 tablet Oral BID   Continuous Infusions:  sodium chloride 75 mL/hr at 12/02/22 1110    LOS: 1 day   Raiford Noble, DO [facility] Available via Epic secure chat 7am-7pm After these hours, please refer to coverage provider listed on [email or web address] 12/02/2022, 4:23 PM

## 2022-12-03 ENCOUNTER — Inpatient Hospital Stay (HOSPITAL_COMMUNITY): Payer: Medicare Other

## 2022-12-03 DIAGNOSIS — J441 Chronic obstructive pulmonary disease with (acute) exacerbation: Secondary | ICD-10-CM | POA: Diagnosis not present

## 2022-12-03 DIAGNOSIS — J440 Chronic obstructive pulmonary disease with acute lower respiratory infection: Secondary | ICD-10-CM | POA: Diagnosis not present

## 2022-12-03 DIAGNOSIS — J9601 Acute respiratory failure with hypoxia: Secondary | ICD-10-CM | POA: Diagnosis not present

## 2022-12-03 DIAGNOSIS — J121 Respiratory syncytial virus pneumonia: Secondary | ICD-10-CM | POA: Diagnosis not present

## 2022-12-03 LAB — COMPREHENSIVE METABOLIC PANEL
ALT: 38 U/L (ref 0–44)
AST: 34 U/L (ref 15–41)
Albumin: 3.2 g/dL — ABNORMAL LOW (ref 3.5–5.0)
Alkaline Phosphatase: 51 U/L (ref 38–126)
Anion gap: 13 (ref 5–15)
BUN: 58 mg/dL — ABNORMAL HIGH (ref 8–23)
CO2: 20 mmol/L — ABNORMAL LOW (ref 22–32)
Calcium: 8.6 mg/dL — ABNORMAL LOW (ref 8.9–10.3)
Chloride: 101 mmol/L (ref 98–111)
Creatinine, Ser: 1.5 mg/dL — ABNORMAL HIGH (ref 0.44–1.00)
GFR, Estimated: 38 mL/min — ABNORMAL LOW (ref >60)
Glucose, Bld: 168 mg/dL — ABNORMAL HIGH (ref 70–99)
Potassium: 4.4 mmol/L (ref 3.5–5.1)
Sodium: 134 mmol/L — ABNORMAL LOW (ref 135–145)
Total Bilirubin: 0.6 mg/dL (ref 0.3–1.2)
Total Protein: 7.1 g/dL (ref 6.5–8.1)

## 2022-12-03 LAB — PHOSPHORUS: Phosphorus: 4.8 mg/dL — ABNORMAL HIGH (ref 2.5–4.6)

## 2022-12-03 LAB — CBC WITH DIFFERENTIAL/PLATELET
Abs Immature Granulocytes: 0.07 10*3/uL (ref 0.00–0.07)
Basophils Absolute: 0 10*3/uL (ref 0.0–0.1)
Basophils Relative: 0 %
Eosinophils Absolute: 0 10*3/uL (ref 0.0–0.5)
Eosinophils Relative: 0 %
HCT: 38.1 % (ref 36.0–46.0)
Hemoglobin: 12.4 g/dL (ref 12.0–15.0)
Immature Granulocytes: 1 %
Lymphocytes Relative: 7 %
Lymphs Abs: 1 10*3/uL (ref 0.7–4.0)
MCH: 29.6 pg (ref 26.0–34.0)
MCHC: 32.5 g/dL (ref 30.0–36.0)
MCV: 90.9 fL (ref 80.0–100.0)
Monocytes Absolute: 0.3 10*3/uL (ref 0.1–1.0)
Monocytes Relative: 2 %
Neutro Abs: 11.5 10*3/uL — ABNORMAL HIGH (ref 1.7–7.7)
Neutrophils Relative %: 90 %
Platelets: 365 10*3/uL (ref 150–400)
RBC: 4.19 MIL/uL (ref 3.87–5.11)
RDW: 12.9 % (ref 11.5–15.5)
WBC: 12.8 10*3/uL — ABNORMAL HIGH (ref 4.0–10.5)
nRBC: 0 % (ref 0.0–0.2)

## 2022-12-03 LAB — GLUCOSE, CAPILLARY
Glucose-Capillary: 161 mg/dL — ABNORMAL HIGH (ref 70–99)
Glucose-Capillary: 137 mg/dL — ABNORMAL HIGH (ref 70–99)
Glucose-Capillary: 183 mg/dL — ABNORMAL HIGH (ref 70–99)
Glucose-Capillary: 163 mg/dL — ABNORMAL HIGH (ref 70–99)

## 2022-12-03 LAB — MAGNESIUM: Magnesium: 2.2 mg/dL (ref 1.7–2.4)

## 2022-12-03 MED ORDER — GUAIFENESIN-DM 100-10 MG/5ML PO SYRP: 5.0000 mL | ORAL_SOLUTION | ORAL | Status: DC | PRN

## 2022-12-03 MED ORDER — SODIUM CHLORIDE 0.9 % IV SOLN: INTRAVENOUS | Status: AC

## 2022-12-03 NOTE — Progress Notes (Deleted)
    Durable Medical Equipment  (From admission, onward)           Start     Ordered   12/03/22 1550  For home use only DME Walker rolling  Once       Question Answer Comment  Walker: With 5 Inch Wheels   Patient needs a walker to treat with the following condition Gait instability      12/03/22 1549   12/03/22 1548  For home use only DME Bedside commode  Once       Comments: Pt's generalized weakness and decreased activity tolerance necessitate recommendation for 3n1 to use as shower seat at home for safe completion of full showering/bathing tasks  Question:  Patient needs a bedside commode to treat with the following condition  Answer:  Weakness   12/03/22 1549

## 2022-12-03 NOTE — Progress Notes (Signed)
    Durable Medical Equipment  (From admission, onward)           Start     Ordered   12/03/22 1651  For home use only DME Bedside commode  Once       Comments: Patient needs BSC d/t SOB upon exertion and being unable to make it to the bathroom without becoming Hypoxic, needing a commode at bedside.  Question:  Patient needs a bedside commode to treat with the following condition  Answer:  Weakness   12/03/22 1652   12/03/22 1550  For home use only DME Walker rolling  Once       Question Answer Comment  Walker: With [place]   Patient needs a walker to treat with the following condition Gait instability      12/03/22 1549

## 2022-12-03 NOTE — Evaluation (Signed)
Occupational Therapy Evaluation Patient Details Name: [name] MRN: [id number] DOB: 1956/08/14 Today's Date: 12/03/2022   History of Present Illness Pt is a 67 y.o. female presenting with congestion and wheezing for the past 4 days and has been using nebulizer inhalers without improvement. PMH significnat for arthritis, COPD gold stage II, HTN, asthma, GERD, HTN, obesity.   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 Upon eval, pt presents with DOE , decreased activity tolerance, and generalized weakness. Pt required min guard A for functional mobility and LB ADL within the room this session. Pt desatting to 87% SpO2 after functional mobility to sink and donning nasal cannula at 3LO2 with recovery to 93% within ~1 minute. Pt will continue to benefit from skilled OT services in the acute setting and follow up [place] to optimize safety and indpendence with ADL and IADL.      Recommendations for follow up therapy are one component of a multi-disciplinary discharge planning process, led by the attending physician.  Recommendations may be updated based on patient status, additional functional criteria and insurance authorization.   Follow Up Recommendations  Home health OT     Assistance Recommended at Discharge Intermittent Supervision/Assistance  Patient can return home with the following A little help with walking and/or transfers;A little help with bathing/dressing/bathroom;Assistance with cooking/housework;Assist for transportation;Help with stairs or ramp for entrance    Functional Status Assessment  Patient has had a recent decline in their functional status and demonstrates the ability to make significant improvements in function in a reasonable and predictable amount of time.  Equipment Recommendations  BSC/3in1;Other (comment) (RW)    Recommendations for Other Services       Precautions / Restrictions  Precautions Precautions: Fall Precaution Comments: Fall risk is present, and minimized with RW; chronic back pain with walking at baseline Restrictions Weight Bearing Restrictions: No      Mobility Bed Mobility Overal bed mobility: Needs Assistance             General bed mobility comments: EOB on arrival and departure    Transfers Overall transfer level: Needs assistance Equipment used: Rolling walker (2 wheels) Transfers: Sit to/from Stand Sit to Stand: Min guard           General transfer comment: cues for hand placement      Balance Overall balance assessment: Mild deficits observed, not formally tested                                         ADL either performed or assessed with clinical judgement   ADL Overall ADL's : Needs assistance/impaired Eating/Feeding: Modified independent;Sitting   Grooming: Oral care;Min guard;Standing Grooming Details (indicate cue type and reason): min guard A for safety. Initially performing functional mobility to sink without O2, but with SpO2 dropping to 87 and had to don O2 again prior to performing ADL at sink for pt safety Upper Body Bathing: Set up;Sitting   Lower Body Bathing: Min guard;Sit to/from stand   Upper Body Dressing : Set up;Sitting   Lower Body Dressing: Min guard;Sit to/from stand Lower Body Dressing Details (indicate cue type and reason): donning socks sitting EOB Toilet Transfer: Min guard;Ambulation;Comfort height toilet Toilet Transfer Details (indicate cue type and reason): simulated in room         Functional mobility during ADLs:  Min guard General ADL Comments: may benefit from RW for stability during mobility     Vision Ability to See in Adequate Light: 0 Adequate Patient Visual Report: No change from baseline Vision Assessment?: No apparent visual deficits     Perception     Praxis      Pertinent Vitals/Pain Pain Assessment Pain Assessment: No/denies pain      Hand Dominance     Extremity/Trunk Assessment Upper Extremity Assessment Upper Extremity Assessment: Generalized weakness   Lower Extremity Assessment Lower Extremity Assessment: Defer to PT evaluation   Cervical / Trunk Assessment Cervical / Trunk Assessment: Other exceptions Cervical / Trunk Exceptions: Chronic low back pain   Communication Communication Communication: No difficulties   Cognition Arousal/Alertness: Awake/alert Behavior During Therapy: WFL for tasks assessed/performed Overall Cognitive Status: Within Functional Limits for tasks assessed                                       General Comments  desatting to 87% when not wearing O2, but re-donning for safety and SpO2 93    Exercises     Shoulder Instructions      Home Living Family/patient expects to be discharged to:: Private residence Living Arrangements: Alone Available Help at Discharge: Friend(s);Available PRN/intermittently Type of Home: Apartment Home Access: Stairs to enter Entrance Stairs-Number of Steps: 14 Entrance Stairs-Rails: Right Home Layout: One level     Bathroom Shower/Tub: Teacher, early years/pre: Standard     Home Equipment: Shower seat;Hand held shower head;Grab bars - tub/shower;Cane - single point          Prior Functioning/Environment Prior Level of Function : Independent/Modified Independent;Driving             Mobility Comments: occasional use of cane ADLs Comments: 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        OT Problem List: Decreased strength;Decreased activity tolerance;Impaired balance (sitting and/or standing);Cardiopulmonary status limiting activity      OT Treatment/Interventions: Self-care/ADL training;Therapeutic exercise;DME and/or AE instruction;Therapeutic activities;Patient/family education;Balance training    OT Goals(Current goals  can be found in the care plan section) Acute Rehab OT Goals Patient Stated Goal: go home OT Goal Formulation: With patient Time For Goal Achievement: 12/17/22 Potential to Achieve Goals: Good  OT Frequency: Min 2X/week    Co-evaluation              AM-PAC OT "6 Clicks" Daily Activity     Outcome Measure Help from another person eating meals?: None Help from another person taking care of personal grooming?: A Little Help from another person toileting, which includes using toliet, bedpan, or urinal?: A Little Help from another person bathing (including washing, rinsing, drying)?: A Little Help from another person to put on and taking off regular upper body clothing?: A Little Help from another person to put on and taking off regular lower body clothing?: A Little 6 Click Score: 19   End of Session Equipment Utilized During Treatment: Gait belt Nurse Communication: Mobility status  Activity Tolerance: Patient tolerated treatment well Patient left: in bed;with call bell/phone within reach  OT Visit Diagnosis: Unsteadiness on feet (R26.81);Muscle weakness (generalized) (M62.81);Other abnormalities of gait and mobility (R26.89)                Time: 6568-1275 OT Time Calculation (min): 29 min Charges:  OT General Charges $OT Visit: 1 Visit OT Evaluation $OT Eval Low Complexity: 1 Low OT Treatments $Self Care/Home Management : 8-22 mins  Elder Cyphers, OTR/L [facility] Acute Rehabilitation Office: [phone]   Magnus Ivan 12/03/2022, 2:35 PM

## 2022-12-03 NOTE — TOC Initial Note (Signed)
Transition of Care [facility]) - Initial/Assessment Note    Patient Details  Name: [name] MRN: [id number] Date of Birth: [birth year]  Transition of Care [facility]) CM/SW Contact:    Tom-Johnson, Renea Ee, RN Phone Number: 12/03/2022, 3:55 PM  Clinical Narrative:                  CM spoke with patient at bedside about needs for post hospital transition. Admitted for COPD and found to be RSV+. Currently on 2L O2 acute, does not use home O2. Also on Oral abx.   From home alone, has one son who lives out of state. States all her family members are out of state, mainly in [place]. Has supportive friends that helps when needed.  Independent with care and drive self prior to admission. Currently employed as a Optometrist. Does not have DME's at home. RW and BSC ordered from [employer], Erasmo Downer to deliver at bedside.  Home health referral called in to [facility] per patient's request, Claiborne Billings voiced acceptance and info on AVS.  PCP is [name], MD and uses [facility] on [address].  CM will continue to follow as patient progresses towards discharge.         Expected Discharge Plan: [place] Barriers to Discharge: Continued Medical Work up   Patient Goals and CMS Choice Patient states their goals for this hospitalization and ongoing recovery are:: To return home CMS Medicare.gov Compare Post Acute Care list provided to:: Patient   [place] ownership interest in [facility].provided to:: Patient    Expected Discharge Plan and Services   Discharge Planning Services: CM Consult Post Acute Care Choice: [place] arrangements for the past 2 months: Apartment                 DME Arranged: Bedside commode, Walker rolling DME Agency: [employer] Date DME Agency Contacted: 12/03/22 Time DME Agency Contacted: [phone] Representative spoke with at DME Agency: Erasmo Downer. HH Arranged: PT, OT HH Agency: [place] Date HH Agency Contacted: 12/03/22 Time [place]: [phone] Representative spoke with at [place]: Claiborne Billings  Prior Living Arrangements/Services Living arrangements for the past 2 months: [place] with:: Self Patient language and need for interpreter reviewed:: Yes Do you feel safe going back to the place where you live?: Yes      Need for Family Participation in Patient Care: Yes (Comment) Care giver support system in place?: Yes (comment)   Criminal Activity/Legal Involvement Pertinent to Current Situation/Hospitalization: No - Comment as needed  Activities of Daily Living      Permission Sought/Granted Permission sought to share information with : Case Manager, Customer service manager, Family Supports Permission granted to share information with : Yes, Verbal Permission Granted              Emotional Assessment Appearance:: Appears stated age Attitude/Demeanor/Rapport: Engaged, Gracious Affect (typically observed): Accepting, Appropriate, Calm, Hopeful, Pleasant Orientation: : Oriented to Self, Oriented to Place, Oriented to  Time, Oriented to Situation Alcohol / Substance Use: Not Applicable Psych Involvement: No (comment)  Admission diagnosis:  COPD (chronic obstructive pulmonary disease) (HCC) [J44.9] Dyspnea, unspecified type [R06.00] Patient Active Problem List   Diagnosis Date Noted   COPD with acute exacerbation ([place]) 11/30/2022   RSV (respiratory syncytial virus pneumonia) 11/30/2022   Acute respiratory failure with hypoxia ([place]) 11/30/2022   COPD (chronic obstructive pulmonary disease) ([place]) 11/30/2022   Chronic throat clearing 07/17/2021  Stage 3a chronic kidney disease ([place]) 01/23/2021   Cataract, age-related 04/27/2020   Multiple somatic complaints 01/10/2019   Adenomatous polyp of colon 05/25/2018   History of colonic polyps 03/06/2017   Asthma, chronic, mild persistent, uncomplicated 58/85/0277   Chronic seasonal allergic  rhinitis due to pollen 03/06/2017   Gastroesophageal reflux disease without esophagitis 10/10/2014   Ex-cigarette smoker 09/28/2013   Type 2 diabetes mellitus with complications ([place]) 41/28/7867   Obesity (BMI 30-39.9) 07/12/2012   Chronic back pain 07/12/2012   Hypertension associated with diabetes ([place]) 07/12/2012   PCP:  [name], MD Pharmacy:   [place] ([place]), [place] - [address] [place] [address] Phone: [phone] Fax: [phone]  [place], [place] - [address], [place], [place] [address] Phone: [phone] Fax: [phone]     Social Determinants of Health (SDOH) Social History: SDOH Screenings   Food Insecurity: No Food Insecurity (06/06/2022)  Housing: Medium Risk (01/20/2022)  Transportation Needs: No Transportation Needs (06/06/2022)  Depression (PHQ2-9): Low Risk  (09/17/2022)  Financial Resource Strain: Low Risk  (06/06/2022)  Physical Activity: Inactive (06/06/2022)  Stress: No Stress Concern Present (06/06/2022)  Tobacco Use: Medium Risk (09/17/2022)   SDOH Interventions: Transportation Interventions: Intervention Not Indicated, Inpatient TOC, Patient Resources (Friends/Family)   Readmission Risk Interventions     No data to display

## 2022-12-03 NOTE — Progress Notes (Addendum)
PROGRESS NOTE    [name]  QKM:[id number] DOB: December 26, 1955 DOA: 11/30/2022 PCP: [name], MD   Brief Narrative:  [name] is a 67 y.o. female with medical history significant of COPD stage II, hypertension, chronic back pain presented to hospital with cough congestion and shortness of breath for 4 days with wheezing.  EMS was called in and patient was noted to be hypoxic with pulse ox of 70% on room air.  EMS gave Solu-Medrol and breathing treatment and was brought into the hospital.  In the ED, patient was noted to be RSV positive.  Patient was then admitted hospital for acute hypoxic respiratory failure with COPD exacerbation.   Subjective Today, patient was seen and examined at bedside.  Patient still has significant wheezing and on high-dose IV steroids.  Still has some shortness of breath and cough.  Assessment and Plan:  Acute Hypoxic Respiratory Failure secondary to acute asthma/COPD exacerbation, likely triggered by RSV infection.  Currently on  2 L oxygen by nasal cannula.  Continue oxygen nebulizer and steroids.  Will need to assess for home oxygen on discharge.  Acute COPD Exacerbation On Solu-Medrol IV '60mg'$  twice daily.  On budesonide nebulizer, Mucinex, incentive spirometry, flutter valve.  On doxycycline 100 mg twice daily.  Will consider p.o. prednisone on discharge.   Essential hypertension. Creatinine elevation so HCTZ and lisinopril on hold.  Continue hydralazine as needed.  Blood pressure is better improved today.   AKI on CKD Stage 3a Metabolic Acidosis  Hyperphosphatemia  Recent Labs  Lab 11/30/22 1011 12/01/22 1216 12/02/22 0657 12/03/22 0425  BUN 12 30* 54* 58*  CREATININE 1.14* 1.39* 1.86* 1.50*   -Was started on normal saline at 75 mill per hour since yesterday.  Creatinine has dipped down to 1.5 today.  Continue to monitor closely.  Reassess renal function in AM.   Leukocytosis Mild leukocytosis.  On steroids.  Continue  to monitor. Recent Labs  Lab 11/30/22 1011 12/01/22 1216 12/02/22 0657 12/03/22 0425  WBC 11.9* 16.6* 17.3* 12.8*     Diabetes mellitus type 2. Continue sliding scale insulin, diabetic diet.  Elevated AST Improved. Recent Labs  Lab 12/01/22 1216 12/02/22 0657 12/03/22 0425  AST 42* 43* 34   Constipation Continue Senokot, MiraLAX and bisacodyl.  Tooth Infection Was on penicillin V.  Discontinue.  Hold off with Motrin due to renal insufficiency.   GERD/GI Prophylaxis Continue Protonix.   Obesity Body mass index is 33.89 kg/m.  Would benefit from weight loss as outpatient.  DVT prophylaxis: enoxaparin (LOVENOX) injection 40 mg Start: 11/30/22 1600    Code Status: Full Code  Family Communication:  None at bedside  Disposition Plan:   Status is: Inpatient  Remains inpatient appropriate because: IV fluids, AKI, pending clinical improvement, IV steroids, will need oxygen assessment prior to discharge, PT OT evaluation   Consultants:  None  Procedures:  None  Antimicrobials:  Doxycycline Penicillin Azithromycin  Anti-infectives (From admission, onward)    Start     Dose/Rate Route Frequency Ordered Stop   12/01/22 2200  doxycycline (VIBRA-TABS) tablet 100 mg        100 mg Oral Every 12 hours 12/01/22 1825     11/30/22 2000  penicillin v potassium (VEETID) tablet 500 mg        500 mg Oral Every 6 hours 11/30/22 1943     11/30/22 0000  azithromycin (ZITHROMAX) 250 MG tablet        250 mg Oral Daily  11/30/22 1413         Subjective: Today, patient was seen and examined at bedside.  Patient still has some cough and wheezing and shortness of breath.  Denies any nausea vomiting fever chills or rigor.  Still on supplemental oxygen.  Objective: Vitals:   12/02/22 2042 12/02/22 2051 12/03/22 0307 12/03/22 0514  BP:    (!) 141/85  Pulse: 86  82 77  Resp: '18  16 18  '$ Temp:    (!) 97.5 F (36.4 C)  TempSrc:    Oral  SpO2: 93% 95% 96% 93%  Weight:       Height:        Intake/Output Summary (Last 24 hours) at 12/03/2022 0739 Last data filed at 12/03/2022 0400 Gross per 24 hour  Intake 1846.38 ml  Output 0 ml  Net 1846.38 ml    Filed Weights   11/30/22 1009  Weight: 95.3 kg    Physical Exam: General: Obese built, not in obvious distress on nasal cannula oxygen HENT:   No scleral pallor or icterus noted. Oral mucosa is moist.  Chest:    Diminished breath sounds bilaterally.  CVS: S1 &S2 heard. No murmur.  Regular rate and rhythm. Abdomen: Soft, nontender, nondistended.  Bowel sounds are heard.   Extremities: No cyanosis, clubbing or edema.  Peripheral pulses are palpable. Psych: Alert, awake and oriented, normal mood CNS:  No cranial nerve deficits.  Power equal in all extremities.   Skin: Warm and dry.  No rashes noted.   Data Reviewed: I have personally reviewed following labs and imaging studies  CBC: Recent Labs  Lab 11/30/22 1011 11/30/22 1035 12/01/22 1216 12/02/22 0657 12/03/22 0425  WBC 11.9*  --  16.6* 17.3* 12.8*  NEUTROABS 9.6*  --  15.1* 15.1* 11.5*  HGB 12.4 12.9 13.9 13.2 12.4  HCT 37.7 38.0 43.3 39.0 38.1  MCV 91.3  --  91.9 89.9 90.9  PLT 333  --  381 384 601    Basic Metabolic Panel: Recent Labs  Lab 11/30/22 1011 11/30/22 1035 12/01/22 1216 12/02/22 0657 12/03/22 0425  NA 137 139 136 136 134*  K 3.8 3.8 4.3 4.2 4.4  CL 103  --  101 100 101  CO2 24  --  23 21* 20*  GLUCOSE 183*  --  148* 154* 168*  BUN 12  --  30* 54* 58*  CREATININE 1.14*  --  1.39* 1.86* 1.50*  CALCIUM 9.2  --  9.2 8.8* 8.6*  MG  --   --  2.1 2.1 2.2  PHOS  --   --  4.9* 6.8* 4.8*    GFR: Estimated Creatinine Clearance: 41.8 mL/min (A) (by C-G formula based on SCr of 1.5 mg/dL (H)). Liver Function Tests: Recent Labs  Lab 12/01/22 1216 12/02/22 0657 12/03/22 0425  AST 42* 43* 34  ALT 33 37 38  ALKPHOS 61 55 51  BILITOT 0.5 0.6 0.6  PROT 8.0 7.7 7.1  ALBUMIN 3.7 3.6 3.2*    No results for input(s):  "LIPASE", "AMYLASE" in the last 168 hours. No results for input(s): "AMMONIA" in the last 168 hours. Coagulation Profile: No results for input(s): "INR", "PROTIME" in the last 168 hours. Cardiac Enzymes: No results for input(s): "CKTOTAL", "CKMB", "CKMBINDEX", "TROPONINI" in the last 168 hours. BNP (last 3 results) No results for input(s): "PROBNP" in the last 8760 hours. HbA1C: No results for input(s): "HGBA1C" in the last 72 hours. CBG: Recent Labs  Lab 12/01/22 1700 12/02/22 0734 12/02/22 1125  12/02/22 1645 12/02/22 2023  GLUCAP 159* 135* 96 202* 128*    Lipid Profile: No results for input(s): "CHOL", "HDL", "LDLCALC", "TRIG", "CHOLHDL", "LDLDIRECT" in the last 72 hours. Thyroid Function Tests: No results for input(s): "TSH", "T4TOTAL", "FREET4", "T3FREE", "THYROIDAB" in the last 72 hours. Anemia Panel: No results for input(s): "VITAMINB12", "FOLATE", "FERRITIN", "TIBC", "IRON", "RETICCTPCT" in the last 72 hours. Sepsis Labs: No results for input(s): "PROCALCITON", "LATICACIDVEN" in the last 168 hours.  Recent Results (from the past 240 hour(s))  Resp panel by RT-PCR (RSV, Flu A&B, Covid) Anterior Nasal Swab     Status: Abnormal   Collection Time: 11/30/22 10:11 AM   Specimen: Anterior Nasal Swab  Result Value Ref Range Status   SARS Coronavirus 2 by RT PCR NEGATIVE NEGATIVE Final    Comment: (NOTE) SARS-CoV-2 target nucleic acids are NOT DETECTED.  The SARS-CoV-2 RNA is generally detectable in upper respiratory specimens during the acute phase of infection. The lowest concentration of SARS-CoV-2 viral copies this assay can detect is 138 copies/mL. A negative result does not preclude SARS-Cov-2 infection and should not be used as the sole basis for treatment or other patient management decisions. A negative result may occur with  improper specimen collection/handling, submission of specimen other than nasopharyngeal swab, presence of viral mutation(s) within  the areas targeted by this assay, and inadequate number of viral copies(<138 copies/mL). A negative result must be combined with clinical observations, patient history, and epidemiological information. The expected result is Negative.  Fact Sheet for Patients:  EntrepreneurPulse.com.au  Fact Sheet for Healthcare Providers:  IncredibleEmployment.be  This test is no t yet approved or cleared by the [place] FDA and  has been authorized for detection and/or diagnosis of SARS-CoV-2 by FDA under an Emergency Use Authorization (EUA). This EUA will remain  in effect (meaning this test can be used) for the duration of the COVID-19 declaration under Section 564(b)(1) of the Act, 21 U.S.C.section 360bbb-3(b)(1), unless the authorization is terminated  or revoked sooner.       Influenza A by PCR NEGATIVE NEGATIVE Final   Influenza B by PCR NEGATIVE NEGATIVE Final    Comment: (NOTE) The Xpert Xpress SARS-CoV-2/FLU/RSV plus assay is intended as an aid in the diagnosis of influenza from Nasopharyngeal swab specimens and should not be used as a sole basis for treatment. Nasal washings and aspirates are unacceptable for Xpert Xpress SARS-CoV-2/FLU/RSV testing.  Fact Sheet for Patients: EntrepreneurPulse.com.au  Fact Sheet for Healthcare Providers: IncredibleEmployment.be  This test is not yet approved or cleared by the [place] FDA and has been authorized for detection and/or diagnosis of SARS-CoV-2 by FDA under an Emergency Use Authorization (EUA). This EUA will remain in effect (meaning this test can be used) for the duration of the COVID-19 declaration under Section 564(b)(1) of the Act, 21 U.S.C. section 360bbb-3(b)(1), unless the authorization is terminated or revoked.     Resp Syncytial Virus by PCR POSITIVE (A) NEGATIVE Final    Comment: (NOTE) Fact Sheet for  Patients: EntrepreneurPulse.com.au  Fact Sheet for Healthcare Providers: IncredibleEmployment.be  This test is not yet approved or cleared by the [place] FDA and has been authorized for detection and/or diagnosis of SARS-CoV-2 by FDA under an Emergency Use Authorization (EUA). This EUA will remain in effect (meaning this test can be used) for the duration of the COVID-19 declaration under Section 564(b)(1) of the Act, 21 U.S.C. section 360bbb-3(b)(1), unless the authorization is terminated or revoked.  Performed at [facility], [address] [place], [place] [address]     Radiology Studies: DG CHEST PORT 1 VIEW  Result Date: 12/02/2022 CLINICAL DATA:  Shortness of breath EXAM: PORTABLE CHEST 1 VIEW COMPARISON:  CXR 11/30/22 FINDINGS: No pleural effusion. No pneumothorax. Unchanged cardiac and mediastinal contours. Linear bibasilar pulmonary opacities, favored to represent atelectasis. No displaced rib fractures. Visualized upper abdomen is unremarkable. IMPRESSION: Linear bibasilar pulmonary opacities, favored to represent atelectasis and/or scarring. Electronically Signed   By: Marin Roberts M.D.   On: 12/02/2022 08:14    Scheduled Meds:  arformoterol  15 mcg Nebulization BID   budesonide (PULMICORT) nebulizer solution  0.5 mg Nebulization Q12H   carvedilol  3.125 mg Oral BID WC   doxycycline  100 mg Oral Q12H   enoxaparin (LOVENOX) injection  40 mg Subcutaneous Q24H   guaiFENesin  1,200 mg Oral BID   insulin aspart  0-15 Units Subcutaneous TID WC   ipratropium  0.5 mg Nebulization Q4H   levalbuterol  0.63 mg Nebulization Q4H   methylPREDNISolone (SOLU-MEDROL) injection  60 mg Intravenous Q12H   montelukast  10 mg Oral QHS   ondansetron  4 mg Oral Q6H   pantoprazole  40 mg Oral Daily   penicillin v potassium  500 mg Oral Q6H   polyethylene glycol  17 g Oral BID   senna-docusate  1 tablet Oral BID   Continuous Infusions:   sodium chloride 75 mL/hr at 12/03/22 0206    LOS: 2 days   Flora Lipps, MD [facility] Available via Epic secure chat 7am-7pm After these hours, please refer to coverage provider listed on [email or web address] 12/03/2022, 7:39 AM

## 2022-12-03 NOTE — Evaluation (Signed)
Physical Therapy Evaluation Patient Details Name: [name] MRN: [id number] DOB: 1956-08-11 Today's Date: 12/03/2022  History of Present Illness  Pt is a 67 y.o. female presenting with congestion and wheezing for the past 4 days and has been using nebulizer inhalers without improvement. PMH significnat for arthritis, COPD gold stage II, HTN, asthma, GERD, HTN, obesity.  Clinical Impression   Pt admitted with above diagnosis. Lives at home alone, in a single-level home with 14 steps to enter; Prior to admission, pt was able to live independently, including volunteering as a Optometrist, using a cane occasionally to walk; Presents to PT with decr functional mobility, decr activity tolerance, benefits from a RW to derc work of walking today; Min/minguard assist for standing, walkign with RW;  Pt currently with functional limitations due to the deficits listed below (see PT Problem List). Pt will benefit from skilled PT to increase their independence and safety with mobility to allow discharge to the venue listed below.       Initated walk on 3 L supplemental O2, and noted sats at 98%; decr to 2 L, then last 10 minutes of session on zero; lowest observed O2 sat 92%; Mobility Specialists to follow up      Recommendations for follow up therapy are one component of a multi-disciplinary discharge planning process, led by the attending physician.  Recommendations may be updated based on patient status, additional functional criteria and insurance authorization.  Follow Up Recommendations Home health PT      Assistance Recommended at Discharge PRN (especially with initial walk up the stair to enter her home)  Patient can return home with the following  Help with stairs or ramp for entrance;Assist for transportation    Equipment Recommendations Rolling walker (2 wheels);BSC/3in1  Recommendations for Other Services  Other (comment) (Mobility Team; please follow up for an O2  qualifying walk)    Functional Status Assessment Patient has had a recent decline in their functional status and demonstrates the ability to make significant improvements in function in a reasonable and predictable amount of time.     Precautions / Restrictions Precautions Precautions: Fall Precaution Comments: Fall risk is present, and minimized with RW; chronic back pain with walking at baseline Restrictions Weight Bearing Restrictions: No      Mobility  Bed Mobility Overal bed mobility: Needs Assistance                  Transfers Overall transfer level: Needs assistance Equipment used: Rolling walker (2 wheels) Transfers: Sit to/from Stand Sit to Stand: Min guard (without physical contact)           General transfer comment: cues for hand placement    Ambulation/Gait Ambulation/Gait assistance: Min guard (without physical contact) Gait Distance (Feet): 120 Feet Assistive device: Rolling walker (2 wheels) Gait Pattern/deviations: Step-through pattern, Trunk flexed Gait velocity: slowed     General Gait Details: Cues to self-monitor for activity tolerance;  Walked with bil hips slightly flexed, and incr support from RW; initated walk on 3 L supplemental O2, and noted sats at 98%; decr to 2 L, then last 10 minutes of session on zero; lowest observed O2 sat 92%  Stairs            Wheelchair Mobility    Modified Rankin (Stroke Patients Only)       Balance Overall balance assessment: Mild deficits observed, not formally tested  Pertinent Vitals/Pain Pain Assessment Pain Assessment: No/denies pain    Home Living Family/patient expects to be discharged to:: Private residence Living Arrangements: Alone Available Help at Discharge: Friend(s);Available PRN/intermittently Type of Home: Apartment Home Access: Stairs to enter Entrance Stairs-Rails: Right Entrance Stairs-Number of Steps: 14    Home Layout: One level Home Equipment: Shower seat;Hand held shower head;Grab bars - tub/shower;Cane - single point      Prior Function Prior Level of Function : Independent/Modified Independent;Driving             Mobility Comments: occasional use of cane ADLs Comments: 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     Hand Dominance        Extremity/Trunk Assessment   Upper Extremity Assessment Upper Extremity Assessment: Defer to OT evaluation    Lower Extremity Assessment Lower Extremity Assessment: Generalized weakness    Cervical / Trunk Assessment Cervical / Trunk Assessment: Other exceptions Cervical / Trunk Exceptions: Chronic low back pain  Communication   Communication: No difficulties  Cognition Arousal/Alertness: Awake/alert Behavior During Therapy: WFL for tasks assessed/performed Overall Cognitive Status: Within Functional Limits for tasks assessed                                          General Comments General comments (skin integrity, edema, etc.): initated walk on 3 L supplemental O2, and noted sats at 98%; decr to 2 L, then last 10 minutes of session on zero; lowest observed O2 sat 92%    Exercises     Assessment/Plan    PT Assessment Patient needs continued PT services  PT Problem List Decreased strength;Decreased activity tolerance;Decreased balance;Decreased mobility;Decreased knowledge of use of DME;Cardiopulmonary status limiting activity       PT Treatment Interventions DME instruction;Gait training;Stair training;Functional mobility training;Therapeutic activities;Therapeutic exercise;Patient/family education    PT Goals (Current goals can be found in the Care Plan section)  Acute Rehab PT Goals Patient Stated Goal: Is interested in getting supplemental O2 at home if she needs it PT Goal Formulation: With patient Time For Goal Achievement:  12/17/22 Potential to Achieve Goals: Good    Frequency Min 3X/week     Co-evaluation               AM-PAC PT "6 Clicks" Mobility  Outcome Measure Help needed turning from your back to your side while in a flat bed without using bedrails?: None Help needed moving from lying on your back to sitting on the side of a flat bed without using bedrails?: None Help needed moving to and from a bed to a chair (including a wheelchair)?: A Little Help needed standing up from a chair using your arms (e.g., wheelchair or bedside chair)?: A Little Help needed to walk in hospital room?: A Little Help needed climbing 3-5 steps with a railing? : A Little 6 Click Score: 20    End of Session   Activity Tolerance: Patient tolerated treatment well Patient left: in bed;with call bell/phone within reach (sittin gEOB) Nurse Communication: Mobility status PT Visit Diagnosis: Other abnormalities of gait and mobility (R26.89) (Decr functional capacity)    Time: 3557-3220 PT Time Calculation (min) (ACUTE ONLY): 30 min   Charges:   PT Evaluation $PT Eval Low Complexity: 1 Low PT Treatments $Gait Training: 8-22 mins        Roney Marion,  PT  Acute Rehabilitation Services Office [place] 12/03/2022, 12:00 PM

## 2022-12-04 DIAGNOSIS — J9601 Acute respiratory failure with hypoxia: Secondary | ICD-10-CM | POA: Diagnosis not present

## 2022-12-04 DIAGNOSIS — J440 Chronic obstructive pulmonary disease with acute lower respiratory infection: Secondary | ICD-10-CM | POA: Diagnosis not present

## 2022-12-04 DIAGNOSIS — J441 Chronic obstructive pulmonary disease with (acute) exacerbation: Secondary | ICD-10-CM | POA: Diagnosis not present

## 2022-12-04 DIAGNOSIS — J121 Respiratory syncytial virus pneumonia: Secondary | ICD-10-CM | POA: Diagnosis not present

## 2022-12-04 LAB — BASIC METABOLIC PANEL
Anion gap: 10 (ref 5–15)
BUN: 59 mg/dL — ABNORMAL HIGH (ref 8–23)
CO2: 21 mmol/L — ABNORMAL LOW (ref 22–32)
Calcium: 8.5 mg/dL — ABNORMAL LOW (ref 8.9–10.3)
Chloride: 103 mmol/L (ref 98–111)
Creatinine, Ser: 1.57 mg/dL — ABNORMAL HIGH (ref 0.44–1.00)
GFR, Estimated: 36 mL/min — ABNORMAL LOW (ref >60)
Glucose, Bld: 170 mg/dL — ABNORMAL HIGH (ref 70–99)
Potassium: 4.8 mmol/L (ref 3.5–5.1)
Sodium: 134 mmol/L — ABNORMAL LOW (ref 135–145)

## 2022-12-04 LAB — GLUCOSE, CAPILLARY
Glucose-Capillary: 171 mg/dL — ABNORMAL HIGH (ref 70–99)
Glucose-Capillary: 128 mg/dL — ABNORMAL HIGH (ref 70–99)

## 2022-12-04 LAB — MAGNESIUM: Magnesium: 2.3 mg/dL (ref 1.7–2.4)

## 2022-12-04 LAB — CBC
HCT: 37.4 % (ref 36.0–46.0)
Hemoglobin: 12.5 g/dL (ref 12.0–15.0)
MCH: 30 pg (ref 26.0–34.0)
MCHC: 33.4 g/dL (ref 30.0–36.0)
MCV: 89.7 fL (ref 80.0–100.0)
Platelets: 376 10*3/uL (ref 150–400)
RBC: 4.17 MIL/uL (ref 3.87–5.11)
RDW: 12.8 % (ref 11.5–15.5)
WBC: 14.2 10*3/uL — ABNORMAL HIGH (ref 4.0–10.5)
nRBC: 0 % (ref 0.0–0.2)

## 2022-12-04 MED ORDER — GUAIFENESIN-DM 100-10 MG/5ML PO SYRP: 5.0000 mL | ORAL_SOLUTION | ORAL | 0 refills | Status: DC | PRN

## 2022-12-04 MED ORDER — DOXYCYCLINE HYCLATE 100 MG PO TABS: 100.0000 mg | ORAL_TABLET | Freq: Two times a day (BID) | ORAL | 0 refills | Status: AC

## 2022-12-04 MED ORDER — CARVEDILOL 3.125 MG PO TABS: 3.1250 mg | ORAL_TABLET | Freq: Two times a day (BID) | ORAL | 2 refills | Status: DC

## 2022-12-04 MED ORDER — PREDNISONE 20 MG PO TABS: 40.0000 mg | ORAL_TABLET | Freq: Every day | ORAL | 0 refills | Status: AC

## 2022-12-04 MED ORDER — ALBUTEROL SULFATE HFA 108 (90 BASE) MCG/ACT IN AERS: 2.0000 | INHALATION_SPRAY | RESPIRATORY_TRACT | 2 refills | Status: DC | PRN

## 2022-12-04 NOTE — Progress Notes (Signed)
Physical Therapy Treatment Patient Details Name: [name] MRN: [id number] DOB: 1956/08/03 Today's Date: 12/04/2022   History of Present Illness Pt is a 67 y.o. female presenting with congestion and wheezing for the past 4 days and has been using nebulizer inhalers without improvement. PMH significnat for arthritis, COPD gold stage II, HTN, asthma, GERD, HTN, obesity.    PT Comments    Patient was agreeable to PT. She is requesting home oxygen at discharge.   Sp02 at rest on 2 L02 97% Sp02 at rest on room air 95% Sp02 on room air with ambulation, 89% (briefly) but mostly in the 90-92% range.   Patient ambulated in hallway pushing the IV pole per her preference. Encourage patient to use rolling walker with home ambulation. Educated patient on pursed lip breathing techniques with mild shortness of breath noted with hallway ambulation. No physical assistance required with mobility. Recommend to continue PT to maximize independence.    Recommendations for follow up therapy are one component of a multi-disciplinary discharge planning process, led by the attending physician.  Recommendations may be updated based on patient status, additional functional criteria and insurance authorization.  Follow Up Recommendations  Home health PT     Assistance Recommended at Discharge PRN  Patient can return home with the following Help with stairs or ramp for entrance;Assist for transportation   Equipment Recommendations  Rolling walker (2 wheels);BSC/3in1    Recommendations for Other Services       Precautions / Restrictions Precautions Precautions: Fall Restrictions Weight Bearing Restrictions: No     Mobility  Bed Mobility               General bed mobility comments: not observed as patient sitting up on arrival and post session    Transfers Overall transfer level: Needs assistance Equipment used: None Transfers: Sit to/from Stand Sit to Stand: Supervision            General transfer comment: cues for safety with no physical assistance required    Ambulation/Gait Ambulation/Gait assistance: Supervision Gait Distance (Feet): 125 Feet Assistive device: IV Pole Gait Pattern/deviations: Step-through pattern Gait velocity: decreased     General Gait Details: patient preferred to ambulate using the IV pole. encourage patient to use the rolling walker at home as needed. patient ambulated on room air with Sp02 mostly at 90-92% with one breif bout down to 89%. the patient feels mildly fatigued after walking with mild increased work of breathing noted. educated patient on pursed lip breathing techniques.   Stairs             Wheelchair Mobility    Modified Rankin (Stroke Patients Only)       Balance Overall balance assessment: Mild deficits observed, not formally tested                                          Cognition Arousal/Alertness: Awake/alert Behavior During Therapy: WFL for tasks assessed/performed Overall Cognitive Status: Within Functional Limits for tasks assessed                                          Exercises      General Comments General comments (skin integrity, edema, etc.): supplemtal 02 removed during mobility to assess need for home 02 and replaced  at end of session for comfort per patient preference. Sp02 97% on 2 L02 at rest, 95% at rest on room air, 89% (briefly) with walking on room air- mostly in the 90-92% range.      Pertinent Vitals/Pain Pain Assessment Pain Assessment: No/denies pain    Home Living                          Prior Function            PT Goals (current goals can now be found in the care plan section) Acute Rehab PT Goals Patient Stated Goal: to get oxygen, go home, and return back to the school PT Goal Formulation: With patient Time For Goal Achievement: 12/17/22 Potential to Achieve Goals: Good Progress towards PT goals:  Progressing toward goals    Frequency    Min 3X/week      PT Plan      Co-evaluation              AM-PAC PT "6 Clicks" Mobility   Outcome Measure  Help needed turning from your back to your side while in a flat bed without using bedrails?: None Help needed moving from lying on your back to sitting on the side of a flat bed without using bedrails?: None Help needed moving to and from a bed to a chair (including a wheelchair)?: A Little Help needed standing up from a chair using your arms (e.g., wheelchair or bedside chair)?: A Little Help needed to walk in hospital room?: A Little Help needed climbing 3-5 steps with a railing? : A Little 6 Click Score: 20    End of Session         PT Visit Diagnosis: Other abnormalities of gait and mobility (R26.89)     Time: 6468-0321 PT Time Calculation (min) (ACUTE ONLY): 29 min  Charges:  $Therapeutic Activity: 23-37 mins                     Minna Merritts, PT, MPT   Percell Locus 12/04/2022, 9:28 AM

## 2022-12-04 NOTE — Progress Notes (Signed)
DISCHARGE NOTE HOME [name] to be discharged Home per MD order. Discussed prescriptions and follow up appointments with the patient. Prescriptions given to patient; medication list explained in detail. Patient verbalized understanding.  Skin clean, dry and intact without evidence of skin break down, no evidence of skin tears noted. IV catheter discontinued intact. Site without signs and symptoms of complications. Dressing and pressure applied. Pt denies pain at the site currently. No complaints noted.  Patient free of lines, drains, and wounds.   An After Visit Summary (AVS) was printed and given to the patient. Patient escorted via wheelchair, and discharged home via private auto.  Keenan Bachelor, RN

## 2022-12-04 NOTE — Progress Notes (Signed)
SATURATION QUALIFICATIONS: (This note is used to comply with regulatory documentation for home oxygen)  Patient Saturations on Room Air at Rest = 92%  Patient Saturations on Room Air while Ambulating = 89%  Patient Saturations on 2 Liters of oxygen while Ambulating = 93%  Please briefly explain why patient needs home oxygen:

## 2022-12-04 NOTE — Discharge Summary (Signed)
Physician Discharge Summary  [name] HTD:[id number] DOB: 09/03/1956 DOA: 11/30/2022  PCP: Lisa Lung, MD  Admit date: 11/30/2022 Discharge date: 12/04/2022  Admitted From: Home  Discharge disposition: Home with home PT  Recommendations for Outpatient Follow-Up:   Follow up with your primary care provider in one week.  Check CBC, BMP, magnesium in the next visit. Please monitor next visit.  HCTZ lisinopril has been discontinued due to renal failure/AKI.  Has been started on Coreg.  Please reassess in the next visit.  Discharge Diagnosis:   Principal Problem:   COPD (chronic obstructive pulmonary disease) (HCC) Active Problems:   COPD with acute exacerbation (HCC)   RSV (respiratory syncytial virus pneumonia)   Acute respiratory failure with hypoxia ([place])  Discharge Condition: Improved.  Diet recommendation: Low sodium, heart healthy.  Wound care: None.  Code status: Full.  History of Present Illness:   [name] is a 67 y.o. female with medical history significant of COPD stage II, hypertension, chronic back pain presented to hospital with cough congestion and shortness of breath for 4 days with wheezing.  EMS was called in and patient was noted to be hypoxic with pulse ox of 70% on room air.  EMS gave Solu-Medrol and breathing treatment and was brought into the hospital.  In the ED, patient was noted to be RSV positive.  Patient was then admitted hospital for acute hypoxic respiratory failure with COPD exacerbation.    Hospital Course:   Following conditions were addressed during hospitalization as listed below,  Acute Hypoxic Respiratory Failure secondary to acute asthma/COPD exacerbation, likely triggered by RSV infection.  Received IV steroids frequent nebulizers and supplemental oxygen during hospitalization and had a prolonged course and slow recovery.  At this time patient will be discharged home on inhalers and prednisone course.    Acute COPD Exacerbation High-dose IV steroids.  Patient also was on budesonide nebulizer, Mucinex, incentive spirometry, flutter valve during hospitalization.  Will continue doxycycline and prednisone on discharge to complete the course.  Essential hypertension. Creatinine elevation so HCTZ and lisinopri was discontinued and at this time patient has been started on Coreg.     AKI on CKD Stage 3a/Metabolic Acidosis /Hyperphosphatemia  Received IV fluids.  Will discontinue lisinopril HCTZ at this time and start the patient on Coreg for blood pressure.  Follow-up with renal function as outpatient.  Discontinue Motrin.  Leukocytosis Mild leukocytosis secondary to steroids.  Diabetes mellitus type 2. Continue diabetic diet metformin on discharge.   Elevated AST Improved  Constipation Received Senokot, MiraLAX and bisacodyl during hospitalization.   Tooth Infection Discouraged use of Motrin in the context of renal failure.  Uses penicillin prior to dental procedure.   GERD/GI Prophylaxis Continue Protonix.   Disposition.  At this time, patient is stable for disposition with outpatient PCP follow-up.  Medical Consultants:   None.  Procedures:    None Subjective:   Today, patient seen and examined at bedside.  Continues to feel better.  Has mild wheezing and cough.  Ambulated without any need for oxygen.  Discharge Exam:   Vitals:   12/04/22 0952 12/04/22 1000  BP: (!) 145/68   Pulse: 83   Resp: 18   Temp: 97.8 F (36.6 C)   SpO2: 96% (!) 89%   Vitals:   12/04/22 0719 12/04/22 0723 12/04/22 0952 12/04/22 1000  BP:   (!) 145/68   Pulse:   83   Resp:   18   Temp:  97.8 F (36.6 C)   TempSrc:   Oral   SpO2: 94% 94% 96% (!) 89%  Weight:      Height:       General: Alert awake, not in obvious distress, obese HENT: pupils equally reacting to light,  No scleral pallor or icterus noted. Oral mucosa is moist.  Chest:    Diminished breath sounds bilaterally.  CVS:  S1 &S2 heard. No murmur.  Regular rate and rhythm. Abdomen: Soft, nontender, nondistended.  Bowel sounds are heard.   Extremities: No cyanosis, clubbing or edema.  Peripheral pulses are palpable. Psych: Alert, awake and oriented, normal mood CNS:  No cranial nerve deficits.  Power equal in all extremities.   Skin: Warm and dry.  No rashes noted.  The results of significant diagnostics from this hospitalization (including imaging, microbiology, ancillary and laboratory) are listed below for reference.     Diagnostic Studies:   DG Chest Port 1 View  Result Date: 11/30/2022 CLINICAL DATA:  Shortness of breath.  Cough.  Asthma. EXAM: PORTABLE CHEST 1 VIEW COMPARISON:  01/14/2016 FINDINGS: The heart size and mediastinal contours are within normal limits. Mild scarring again seen in the left lower lobe. No evidence of acute infiltrate or edema. No evidence of pleural effusion. IMPRESSION: Stable mild left Washington scarring.  No active disease. Electronically Signed   By: Marlaine Hind M.D.   On: 11/30/2022 10:32     Labs:   Basic Metabolic Panel: Recent Labs  Lab 11/30/22 1011 11/30/22 1035 12/01/22 1216 12/02/22 0657 12/03/22 0425 12/04/22 0426  NA 137 139 136 136 134* 134*  K 3.8 3.8 4.3 4.2 4.4 4.8  CL 103  --  101 100 101 103  CO2 24  --  23 21* 20* 21*  GLUCOSE 183*  --  148* 154* 168* 170*  BUN 12  --  30* 54* 58* 59*  CREATININE 1.14*  --  1.39* 1.86* 1.50* 1.57*  CALCIUM 9.2  --  9.2 8.8* 8.6* 8.5*  MG  --   --  2.1 2.1 2.2 2.3  PHOS  --   --  4.9* 6.8* 4.8*  --    GFR Estimated Creatinine Clearance: 39.9 mL/min (A) (by C-G formula based on SCr of 1.57 mg/dL (H)). Liver Function Tests: Recent Labs  Lab 12/01/22 1216 12/02/22 0657 12/03/22 0425  AST 42* 43* 34  ALT 33 37 38  ALKPHOS 61 55 51  BILITOT 0.5 0.6 0.6  PROT 8.0 7.7 7.1  ALBUMIN 3.7 3.6 3.2*   No results for input(s): "LIPASE", "AMYLASE" in the last 168 hours. No results for input(s): "AMMONIA" in the  last 168 hours. Coagulation profile No results for input(s): "INR", "PROTIME" in the last 168 hours.  CBC: Recent Labs  Lab 11/30/22 1011 11/30/22 1035 12/01/22 1216 12/02/22 0657 12/03/22 0425 12/04/22 0426  WBC 11.9*  --  16.6* 17.3* 12.8* 14.2*  NEUTROABS 9.6*  --  15.1* 15.1* 11.5*  --   HGB 12.4 12.9 13.9 13.2 12.4 12.5  HCT 37.7 38.0 43.3 39.0 38.1 37.4  MCV 91.3  --  91.9 89.9 90.9 89.7  PLT 333  --  381 384 365 376   Cardiac Enzymes: No results for input(s): "CKTOTAL", "CKMB", "CKMBINDEX", "TROPONINI" in the last 168 hours. BNP: Invalid input(s): "POCBNP" CBG: Recent Labs  Lab 12/03/22 0757 12/03/22 1134 12/03/22 1544 12/03/22 2052 12/04/22 0719  GLUCAP 161* 137* 183* 163* 171*   D-Dimer No results for input(s): "DDIMER" in the last 72 hours.  Hgb A1c No results for input(s): "HGBA1C" in the last 72 hours. Lipid Profile No results for input(s): "CHOL", "HDL", "LDLCALC", "TRIG", "CHOLHDL", "LDLDIRECT" in the last 72 hours. Thyroid function studies No results for input(s): "TSH", "T4TOTAL", "T3FREE", "THYROIDAB" in the last 72 hours.  Invalid input(s): "FREET3" Anemia work up No results for input(s): "VITAMINB12", "FOLATE", "FERRITIN", "TIBC", "IRON", "RETICCTPCT" in the last 72 hours. Microbiology Recent Results (from the past 240 hour(s))  Resp panel by RT-PCR (RSV, Flu A&B, Covid) Anterior Nasal Swab     Status: Abnormal   Collection Time: 11/30/22 10:11 AM   Specimen: Anterior Nasal Swab  Result Value Ref Range Status   SARS Coronavirus 2 by RT PCR NEGATIVE NEGATIVE Final    Comment: (NOTE) SARS-CoV-2 target nucleic acids are NOT DETECTED.  The SARS-CoV-2 RNA is generally detectable in upper respiratory specimens during the acute phase of infection. The lowest concentration of SARS-CoV-2 viral copies this assay can detect is 138 copies/mL. A negative result does not preclude SARS-Cov-2 infection and should not be used as the sole basis for  treatment or other patient management decisions. A negative result may occur with  improper specimen collection/handling, submission of specimen other than nasopharyngeal swab, presence of viral mutation(s) within the areas targeted by this assay, and inadequate number of viral copies(<138 copies/mL). A negative result must be combined with clinical observations, patient history, and epidemiological information. The expected result is Negative.  Fact Sheet for Patients:  EntrepreneurPulse.com.au  Fact Sheet for Healthcare Providers:  IncredibleEmployment.be  This test is no t yet approved or cleared by the [place] FDA and  has been authorized for detection and/or diagnosis of SARS-CoV-2 by FDA under an Emergency Use Authorization (EUA). This EUA will remain  in effect (meaning this test can be used) for the duration of the COVID-19 declaration under Section 564(b)(1) of the Act, 21 U.S.C.section 360bbb-3(b)(1), unless the authorization is terminated  or revoked sooner.       Influenza A by PCR NEGATIVE NEGATIVE Final   Influenza B by PCR NEGATIVE NEGATIVE Final    Comment: (NOTE) The Xpert Xpress SARS-CoV-2/FLU/RSV plus assay is intended as an aid in the diagnosis of influenza from Nasopharyngeal swab specimens and should not be used as a sole basis for treatment. Nasal washings and aspirates are unacceptable for Xpert Xpress SARS-CoV-2/FLU/RSV testing.  Fact Sheet for Patients: EntrepreneurPulse.com.au  Fact Sheet for Healthcare Providers: IncredibleEmployment.be  This test is not yet approved or cleared by the [place] FDA and has been authorized for detection and/or diagnosis of SARS-CoV-2 by FDA under an Emergency Use Authorization (EUA). This EUA will remain in effect (meaning this test can be used) for the duration of the COVID-19 declaration under Section 564(b)(1) of the Act, 21  U.S.C. section 360bbb-3(b)(1), unless the authorization is terminated or revoked.     Resp Syncytial Virus by PCR POSITIVE (A) NEGATIVE Final    Comment: (NOTE) Fact Sheet for Patients: EntrepreneurPulse.com.au  Fact Sheet for Healthcare Providers: IncredibleEmployment.be  This test is not yet approved or cleared by the [place] FDA and has been authorized for detection and/or diagnosis of SARS-CoV-2 by FDA under an Emergency Use Authorization (EUA). This EUA will remain in effect (meaning this test can be used) for the duration of the COVID-19 declaration under Section 564(b)(1) of the Act, 21 U.S.C. section 360bbb-3(b)(1), unless the authorization is terminated or revoked.  Performed at [facility], [place] [address], [place], [place] [address]  Discharge Instructions:   Discharge Instructions     Call MD for:  severe uncontrolled pain   Complete by: As directed    Call MD for:  temperature >100.4   Complete by: As directed    Diet - low sodium heart healthy   Complete by: As directed    Diet Carb Modified   Complete by: As directed    Discharge instructions   Complete by: As directed    Follow-up with your primary care provider in 1 week.  Check kidney function/blood work at that time..  Continue to take medications as prescribed including inhalers.  No overexertion.  Seek medical attention for worsening symptoms.   Increase activity slowly   Complete by: As directed       Allergies as of 12/04/2022   No Known Allergies      Medication List     STOP taking these medications    Accu-Chek Aviva Plus test strip Generic drug: glucose blood   AquaLance Lancets 30G Misc   BC HEADACHE POWDER PO   ibuprofen 800 MG tablet Commonly known as: ADVIL   lisinopril-hydrochlorothiazide 20-12.5 MG tablet Commonly known as: Zestoretic       TAKE these medications    Accu-Chek Aviva Plus w/Device Kit USE AS  DIRECTED TO CHECK BLOOD GLUCOSE 1 TO 2 TIMES DAILY   acetaminophen 500 MG tablet Commonly known as: TYLENOL Take 1,000 mg by mouth every 6 (six) hours as needed for mild pain.   albuterol 108 (90 Base) MCG/ACT inhaler Commonly known as: VENTOLIN HFA Inhale 2 puffs into the lungs every 4 (four) hours as needed for wheezing or shortness of breath. INHALE 2 PUFFS BY MOUTH EVERY 6 HOURS AS NEEDED FOR WHEEZING FOR SHORTNESS OF BREATH Strength: 108 (90 Base) MCG/ACT What changed: See the new instructions.   carvedilol 3.125 MG tablet Commonly known as: COREG Take 1 tablet (3.125 mg total) by mouth 2 (two) times daily with a meal.   diphenhydramine-acetaminophen 25-500 MG Tabs tablet Commonly known as: TYLENOL PM Take 1 tablet by mouth at bedtime as needed (sleep).   doxycycline 100 MG tablet Commonly known as: VIBRA-TABS Take 1 tablet (100 mg total) by mouth every 12 (twelve) hours for 2 days.   guaiFENesin-dextromethorphan 100-10 MG/5ML syrup Commonly known as: ROBITUSSIN DM Take 5 mLs by mouth every 4 (four) hours as needed for cough.   metformin 1000 MG (OSM) 24 hr tablet Commonly known as: FORTAMET Take 1 tablet (1,000 mg total) by mouth daily with breakfast.   montelukast 10 MG tablet Commonly known as: SINGULAIR Take 1 tablet (10 mg total) by mouth at bedtime.   ondansetron 4 MG tablet Commonly known as: ZOFRAN Take 1 tablet (4 mg total) by mouth every 6 (six) hours.   ondansetron 8 MG disintegrating tablet Commonly known as: ZOFRAN-ODT Take 1 tablet (8 mg total) by mouth every 8 (eight) hours as needed for nausea or vomiting.   pantoprazole 40 MG tablet Commonly known as: Protonix Take 1 tablet (40 mg total) by mouth daily.   predniSONE 20 MG tablet Commonly known as: DELTASONE Take 2 tablets (40 mg total) by mouth daily with breakfast for 3 days.   Trelegy Ellipta 200-62.5-25 MCG/ACT Aepb Generic drug: Fluticasone-Umeclidin-Vilant INHALE 1 PUFF ONCE DAILY What  changed: See the new instructions.               Durable Medical Equipment  (From admission, onward)           Start  Ordered   12/03/22 1651  For home use only DME Bedside commode  Once       Comments: Patient needs BSC d/t SOB upon exertion and being unable to make it to the bathroom without becoming Hypoxic, needing a commode at bedside.  Question:  Patient needs a bedside commode to treat with the following condition  Answer:  Weakness   12/03/22 1652   12/03/22 1550  For home use only DME Walker rolling  Once       Question Answer Comment  Walker: With [place]   Patient needs a walker to treat with the following condition Gait instability      12/03/22 1549            Follow-up Information     Lisa Lung, MD .   Specialty: Family Medicine Contact information: [place] [address] [phone]         [facility] [place] Follow up.   Specialty: Home Health Services Why: Someone will call you to schedule first home visit. If you have not received a call after two days of discharging home, call their number listed. If no one comes to assess, call Case Manager at [phone]. Contact information: [address] [place] [address] [phone]                 Time coordinating discharge: 39 minutes  Signed:  Verdis Bassette  [facility] 12/04/2022, 11:28 AM

## 2022-12-04 NOTE — Care Management Important Message (Signed)
Important Message  Patient Details  Name: Yena Tisby MRN: [id number] Date of Birth: [birth year]   Medicare Important Message Given:  Yes Patient has a Contact Precaution order in Place. Called patient room patient advised me to send a copy to home address  Will mail the copy to the patient home address.      Rylei Masella 12/04/2022, 3:01 PM

## 2022-12-04 NOTE — TOC Transition Note (Signed)
Transition of Care [facility]) - CM/SW Discharge Note   Patient Details  Name: [name] MRN: [id number] Date of Birth: 1956-05-08  [facility] CM/SW Contact:  Tom-Johnson, Renea Ee, RN Phone Number: 12/04/2022, 11:47 AM   Clinical Narrative:     Patient is scheduled for discharge today. Home health info on AVS. RW and BSC delivered by [employer] but patient declined BSC.  Friend to transport at discharge. No further TOC needs noted.        Final next level of care: Home w Home Health Services Barriers to Discharge: Barriers Resolved   Patient Goals and CMS Choice CMS Medicare.gov Compare Post Acute Care list provided to:: Patient    Discharge Placement                  Patient to be transferred to facility by: Friend      Discharge Plan and Services Additional resources added to the After Visit Summary for     Discharge Planning Services: CM Consult Post Acute Care Choice: Home Health          DME Arranged: Bedside commode, Walker rolling DME Agency: [employer] Date DME Agency Contacted: 12/03/22 Time DME Agency Contacted: [phone] Representative spoke with at DME Agency: Erasmo Downer. HH Arranged: PT, OT HH Agency: [facility] Date HH Agency Contacted: 12/03/22 Time [place]: [id number] Representative spoke with at [place]: [place] Determinants of Health ([place]) Interventions SDOH Screenings   Food Insecurity: No Food Insecurity (06/06/2022)  Housing: Medium Risk (01/20/2022)  Transportation Needs: No Transportation Needs (06/06/2022)  Depression (PHQ2-9): Low Risk  (09/17/2022)  Financial Resource Strain: Low Risk  (06/06/2022)  Physical Activity: Inactive (06/06/2022)  Stress: No Stress Concern Present (06/06/2022)  Tobacco Use: Medium Risk (09/17/2022)     Readmission Risk Interventions     No data to display

## 2022-12-09 ENCOUNTER — Ambulatory Visit (INDEPENDENT_AMBULATORY_CARE_PROVIDER_SITE_OTHER): Payer: Medicare Other | Admitting: Family Medicine

## 2022-12-09 ENCOUNTER — Encounter: Payer: Self-pay | Admitting: Family Medicine

## 2022-12-09 ENCOUNTER — Other Ambulatory Visit: Payer: Self-pay | Admitting: Family Medicine

## 2022-12-09 VITALS — BP 172/64 | HR 69 | Temp 98.3°F | Resp 12 | Wt 216.0 lb

## 2022-12-09 DIAGNOSIS — E1159 Type 2 diabetes mellitus with other circulatory complications: Secondary | ICD-10-CM | POA: Diagnosis not present

## 2022-12-09 DIAGNOSIS — J441 Chronic obstructive pulmonary disease with (acute) exacerbation: Secondary | ICD-10-CM

## 2022-12-09 DIAGNOSIS — J121 Respiratory syncytial virus pneumonia: Secondary | ICD-10-CM

## 2022-12-09 DIAGNOSIS — J4530 Mild persistent asthma, uncomplicated: Secondary | ICD-10-CM | POA: Diagnosis not present

## 2022-12-09 DIAGNOSIS — E118 Type 2 diabetes mellitus with unspecified complications: Secondary | ICD-10-CM

## 2022-12-09 DIAGNOSIS — J9601 Acute respiratory failure with hypoxia: Secondary | ICD-10-CM

## 2022-12-09 DIAGNOSIS — J440 Chronic obstructive pulmonary disease with acute lower respiratory infection: Secondary | ICD-10-CM | POA: Diagnosis not present

## 2022-12-09 DIAGNOSIS — I152 Hypertension secondary to endocrine disorders: Secondary | ICD-10-CM | POA: Diagnosis not present

## 2022-12-09 MED ORDER — LEVALBUTEROL TARTRATE 45 MCG/ACT IN AERO: 2.0000 | INHALATION_SPRAY | RESPIRATORY_TRACT | 1 refills | Status: DC | PRN

## 2022-12-09 NOTE — Patient Instructions (Signed)
Use the pantoprazole as needed for your acid indigestion Use the Xopenex more often for your wheezing

## 2022-12-09 NOTE — Telephone Encounter (Signed)
Please clarify-they sent a message on the request. Thanks.

## 2022-12-09 NOTE — Progress Notes (Signed)
   Subjective:    Patient ID: [name], female    DOB: [birth year], 67 y.o.   MRN: [id number]  HPI She is here for a posthospitalization visit.  She was admitted December 31 and sent home on January 4.  She had acute hypoxia with respiratory failure, was diagnosed with RSV.  She does have underlying COPD.  She is taking Trelegy and albuterol but apparently in the hospital she did better on Xopenex to reduce some of the anxiety symptoms related to albuterol.  She was given steroids.  Also she had acute renal failure and was switched from lisinopril/HCTZ to Coreg.  We recommend that she follow-up here concerning continuing this. She continues on her diabetes medications.  Review of Systems     Objective:   Physical Exam Alert and in no distress.  Cardiac exam shows regular rhythm without murmurs or gallops.  Lungs show expiratory wheezing. The hospital record including admitting treatment as well as discharge summary, blood work and x-rays was evaluated.      Assessment & Plan:  Acute respiratory failure with hypoxia (HCC) - Plan: CBC with Differential/Platelet, Comprehensive metabolic panel, Magnesium  Asthma, chronic, mild persistent, uncomplicated  COPD with acute exacerbation ([place]) - Plan: CBC with Differential/Platelet, Comprehensive metabolic panel, Magnesium, levalbuterol (XOPENEX HFA) 45 MCG/ACT inhaler, Ambulatory referral to Pulmonology  RSV (respiratory syncytial virus pneumonia) - Plan: CBC with Differential/Platelet, Comprehensive metabolic panel, Magnesium  Chronic obstructive pulmonary disease with acute lower respiratory infection ([place])  Type 2 diabetes mellitus with complications ([place])  Hypertension associated with diabetes ([place]) I do not think that she is adequately controlled with her underlying COPD since Trelegy is not holding her.  I will switch to Xopenex to reduce unacceptable side effects but think pulmonary referral since she is not adequately  controlled is appropriate.  She will continue on her present medication regimen and follow-up here in several months especially for the diabetes.

## 2022-12-10 LAB — COMPREHENSIVE METABOLIC PANEL
ALT: 24 IU/L (ref 0–32)
AST: 14 IU/L (ref 0–40)
Albumin/Globulin Ratio: 1.6 (ref 1.2–2.2)
Albumin: 3.9 g/dL (ref 3.9–4.9)
Alkaline Phosphatase: 55 IU/L (ref 44–121)
BUN/Creatinine Ratio: 21 (ref 12–28)
BUN: 25 mg/dL (ref 8–27)
Bilirubin Total: 0.3 mg/dL (ref 0.0–1.2)
CO2: 19 mmol/L — ABNORMAL LOW (ref 20–29)
Calcium: 9.3 mg/dL (ref 8.7–10.3)
Chloride: 105 mmol/L (ref 96–106)
Creatinine, Ser: 1.21 mg/dL — ABNORMAL HIGH (ref 0.57–1.00)
Globulin, Total: 2.4 g/dL (ref 1.5–4.5)
Glucose: 128 mg/dL — ABNORMAL HIGH (ref 70–99)
Potassium: 4.3 mmol/L (ref 3.5–5.2)
Sodium: 139 mmol/L (ref 134–144)
Total Protein: 6.3 g/dL (ref 6.0–8.5)
eGFR: 49 mL/min/{1.73_m2} — ABNORMAL LOW (ref >59)

## 2022-12-10 LAB — CBC WITH DIFFERENTIAL/PLATELET
Basophils Absolute: 0 10*3/uL (ref 0.0–0.2)
Basos: 0 %
EOS (ABSOLUTE): 0.3 10*3/uL (ref 0.0–0.4)
Eos: 2 %
Hematocrit: 35.2 % (ref 34.0–46.6)
Hemoglobin: 11.6 g/dL (ref 11.1–15.9)
Immature Grans (Abs): 0.2 10*3/uL — ABNORMAL HIGH (ref 0.0–0.1)
Immature Granulocytes: 2 %
Lymphocytes Absolute: 3.4 10*3/uL — ABNORMAL HIGH (ref 0.7–3.1)
Lymphs: 25 %
MCH: 29.5 pg (ref 26.6–33.0)
MCHC: 33 g/dL (ref 31.5–35.7)
MCV: 90 fL (ref 79–97)
Monocytes Absolute: 1.2 10*3/uL — ABNORMAL HIGH (ref 0.1–0.9)
Monocytes: 9 %
Neutrophils Absolute: 8.5 10*3/uL — ABNORMAL HIGH (ref 1.4–7.0)
Neutrophils: 62 %
Platelets: 436 10*3/uL (ref 150–450)
RBC: 3.93 x10E6/uL (ref 3.77–5.28)
RDW: 12.7 % (ref 11.7–15.4)
WBC: 13.7 10*3/uL — ABNORMAL HIGH (ref 3.4–10.8)

## 2022-12-10 LAB — MAGNESIUM: Magnesium: 1.7 mg/dL (ref 1.6–2.3)

## 2022-12-11 ENCOUNTER — Telehealth: Payer: Medicare Other

## 2022-12-19 ENCOUNTER — Ambulatory Visit
Admission: RE | Admit: 2022-12-19 | Discharge: 2022-12-19 | Disposition: A | Discharge status: Home / Self Care | Payer: 59 | Source: Ambulatory Visit | Attending: Family Medicine | Admitting: Family Medicine

## 2022-12-19 ENCOUNTER — Other Ambulatory Visit: Payer: Self-pay | Admitting: Family Medicine

## 2022-12-19 DIAGNOSIS — Z780 Asymptomatic menopausal state: Secondary | ICD-10-CM | POA: Diagnosis not present

## 2022-12-19 DIAGNOSIS — Z1231 Encounter for screening mammogram for malignant neoplasm of breast: Secondary | ICD-10-CM | POA: Diagnosis not present

## 2022-12-19 DIAGNOSIS — M8588 Other specified disorders of bone density and structure, other site: Secondary | ICD-10-CM | POA: Diagnosis not present

## 2022-12-26 ENCOUNTER — Telehealth: Payer: Self-pay | Admitting: Family Medicine

## 2022-12-26 NOTE — Telephone Encounter (Signed)
Pt wants to know what strength of the vit D she needs to be taking

## 2022-12-30 NOTE — Telephone Encounter (Signed)
Bone density test done on 12/19/2022 showed osteopenia. Patient was advised and told to take vitamin D and calcium and physical activity. Patient wants to know the dose of Vitamin D she needs to take. Please advise.

## 2023-01-01 ENCOUNTER — Other Ambulatory Visit: Payer: Self-pay | Admitting: Family Medicine

## 2023-01-01 NOTE — Telephone Encounter (Signed)
Patient advised.

## 2023-01-01 NOTE — Telephone Encounter (Signed)
Patient advised. She states she already purchased the 5,000 units dose of Vitamin D. Patient wants to know if she can take the 5,000 unit dose every other day, since she already has it?

## 2023-01-09 ENCOUNTER — Telehealth: Payer: Self-pay

## 2023-01-09 NOTE — Progress Notes (Signed)
Patient ID: [name], female   DOB: 1956-08-01, 67 y.o.   MRN: [id number]  Care Management & Coordination Services Pharmacy Team  Reason for Encounter: Reschedule Pharmacist visit   Call to patient to reschedule encounter with Pharm D Angela H. Patient in agreement and aware.    Chart Updates:  Recent office visits:  12/09/22 [name], MD - Patient presented for Acute respiratory failure with hypoxia and other concerns. Prescribed Lev albuterol. Stopped Albuterol.  Recent consult visits:  12/19/22 Patient presented to [place] for [facility] visits:  Medication Reconciliation was completed by comparing discharge summary, patient's EMR and Pharmacy list, and upon discussion with patient.  Patient presented to [facility] on 11/30/22 due to COPD. Patient was present for 4 days.   New?Medications Started at [facility] Discharge:?? -started  carvedilol (COREG) doxycycline (VIBRA-TABS) guaiFENesin-dextromethorphan (ROBITUSSIN DM) predniSONE (DELTASONE)  Medication Changes at Hospital Discharge: -Changed  albuterol (VENTOLIN HFA)  Medications Discontinued at Hospital Discharge: -Stopped  Accu-Chek Aviva Plus test strip (glucose blood) AquaLance Lancets 30G Misc BC HEADACHE POWDER PO ibuprofen 800 MG tablet (ADVIL) lisinopril-hydrochlorothiazide 20-12.5 MG tablet Medications that remain the same after Hospital Discharge:??  -All other medications will remain the same.      Medication Reconciliation was completed by comparing discharge summary, patient's EMR and Pharmacy list, and upon discussion with patient.  Patient presented to [facility] on 11/30/22 due to COPD. Patient was present for 4 days.   New?Medications Started at [facility] Discharge:?? -started  ondansetron 4 MG tablet  Medication Changes at Hospital Discharge: -Changed  none  Medications Discontinued at Hospital Discharge: -Stopped   none Medications that remain the same after Hospital Discharge:??  -All other medications will remain the same.     Medications: Outpatient Encounter Medications as of 01/09/2023  Medication Sig   acetaminophen (TYLENOL) 500 MG tablet Take 1,000 mg by mouth every 6 (six) hours as needed for mild pain.   Blood Glucose Monitoring Suppl (ACCU-CHEK AVIVA PLUS) w/Device KIT USE AS DIRECTED TO CHECK BLOOD GLUCOSE 1 TO 2 TIMES DAILY   carvedilol (COREG) 3.125 MG tablet Take 1 tablet (3.125 mg total) by mouth 2 (two) times daily with a meal.   diphenhydramine-acetaminophen (TYLENOL PM) 25-500 MG TABS tablet Take 1 tablet by mouth at bedtime as needed (sleep).   Fluticasone-Umeclidin-Vilant (TRELEGY ELLIPTA) 200-62.5-25 MCG/ACT AEPB INHALE 1 PUFF ONCE DAILY (Patient taking differently: Inhale 1 puff into the lungs daily.)   guaiFENesin-dextromethorphan (ROBITUSSIN DM) 100-10 MG/5ML syrup Take 5 mLs by mouth every 4 (four) hours as needed for cough.   levalbuterol (XOPENEX HFA) 45 MCG/ACT inhaler INHALE 2 PUFFS BY MOUTH EVERY 4 HOURS AS NEEDED FOR WHEEZING   metformin (FORTAMET) 1000 MG (OSM) 24 hr tablet Take 1 tablet by mouth once daily with breakfast   montelukast (SINGULAIR) 10 MG tablet Take 1 tablet (10 mg total) by mouth at bedtime.   ondansetron (ZOFRAN) 4 MG tablet Take 1 tablet (4 mg total) by mouth every 6 (six) hours.   ondansetron (ZOFRAN-ODT) 8 MG disintegrating tablet Take 1 tablet (8 mg total) by mouth every 8 (eight) hours as needed for nausea or vomiting.   pantoprazole (PROTONIX) 40 MG tablet Take 1 tablet (40 mg total) by mouth daily.   No facility-administered encounter medications on file as of 01/09/2023.    Recent vitals BP Readings from Last 3 Encounters:  12/09/22 (!) 172/64  12/04/22 (!) 145/68  10/23/22 (!) 156/68  Pulse Readings from Last 3 Encounters:  12/09/22 69  12/04/22 83  10/23/22 82   Wt Readings from Last 3 Encounters:  12/09/22 216 lb (98 kg)  11/30/22  210 lb (95.3 kg)  10/23/22 214 lb (97.1 kg)   BMI Readings from Last 3 Encounters:  12/09/22 34.86 kg/m  11/30/22 33.89 kg/m  10/23/22 34.54 kg/m    Recent lab results    Component Value Date/Time   NA 139 12/09/2022 0940   K 4.3 12/09/2022 0940   CL 105 12/09/2022 0940   CO2 19 (L) 12/09/2022 0940   GLUCOSE 128 (H) 12/09/2022 0940   GLUCOSE 170 (H) 12/04/2022 0426   BUN 25 12/09/2022 0940   CREATININE 1.21 (H) 12/09/2022 0940   CREATININE 1.11 (H) 03/06/2017 1548   CALCIUM 9.3 12/09/2022 0940    Lab Results  Component Value Date   CREATININE 1.21 (H) 12/09/2022   EGFR 49 (L) 12/09/2022   GFRNONAA 36 (L) 12/04/2022   GFRAA 51 (L) 01/22/2021   Lab Results  Component Value Date/Time   HGBA1C 6.8 (A) 09/17/2022 03:54 PM   HGBA1C 7.0 (A) 06/13/2022 04:36 PM   HGBA1C 5.4 06/09/2019 12:00 AM   HGBA1C 7.7 05/12/2019 12:00 AM   MICROALBUR 41.8 09/17/2022 03:55 PM   MICROALBUR 12.0 02/27/2020 04:27 PM    Lab Results  Component Value Date   CHOL 161 09/17/2022   HDL 51 09/17/2022   LDLCALC 95 09/17/2022   TRIG 79 09/17/2022   CHOLHDL 3.2 09/17/2022    Care Gaps: Dexa - DONE Colonoscopy - Overdue PNA - Overdue Foot Exam - Overdue Flu Vaccine - Overdue TDAP - Overdue AWV- 09/17/22  Star Rating Drugs:  Metformin 1000 mg - Last filled 01/02/23 90 DS at [place] Pharmacist Assistant [phone]

## 2023-01-26 ENCOUNTER — Other Ambulatory Visit: Payer: Self-pay | Admitting: Family Medicine

## 2023-01-26 DIAGNOSIS — J4530 Mild persistent asthma, uncomplicated: Secondary | ICD-10-CM

## 2023-01-28 NOTE — Progress Notes (Unsigned)
Care Management & Coordination Services Pharmacy Note  01/28/2023 Name:  Chosen Wada MRN:  [id number] DOB:  1956/11/12  Summary: ***  Recommendations/Changes made from today's visit: ***  Follow up plan: ***   Subjective: [name] is an 67 y.o. year old female who is a primary patient of [name], MD.  The care coordination team was consulted for assistance with disease management and care coordination needs.    {CCMTELEPHONEFACETOFACE:21091510} for follow up visit.  Recent office visits: 12/09/22 [name], MD - Patient presented for Acute respiratory failure with hypoxia and other concerns. Prescribed Lev albuterol. Stopped Albuterol   Recent consult visits: 12/19/22 Patient presented to [place] for [facility] visits: 11/30/22 [facility] for COPD. LOS 4 days. START Carvedilol, Doxycycline, Mucinex DM, and Prednisone. STOP lisinopril-hctz, ibuprofen, BC powder   Objective:  Lab Results  Component Value Date   CREATININE 1.21 (H) 12/09/2022   BUN 25 12/09/2022   EGFR 49 (L) 12/09/2022   GFRNONAA 36 (L) 12/04/2022   GFRAA 51 (L) 01/22/2021   NA 139 12/09/2022   K 4.3 12/09/2022   CALCIUM 9.3 12/09/2022   CO2 19 (L) 12/09/2022   GLUCOSE 128 (H) 12/09/2022    Lab Results  Component Value Date/Time   HGBA1C 6.8 (A) 09/17/2022 03:54 PM   HGBA1C 7.0 (A) 06/13/2022 04:36 PM   HGBA1C 5.4 06/09/2019 12:00 AM   HGBA1C 7.7 05/12/2019 12:00 AM   MICROALBUR 41.8 09/17/2022 03:55 PM   MICROALBUR 12.0 02/27/2020 04:27 PM    Last diabetic Eye exam:  Lab Results  Component Value Date/Time   HMDIABEYEEXA No Retinopathy 08/05/2022 12:00 AM    Last diabetic Foot exam: No results found for: "HMDIABFOOTEX"   Lab Results  Component Value Date   CHOL 161 09/17/2022   HDL 51 09/17/2022   LDLCALC 95 09/17/2022   TRIG 79 09/17/2022   CHOLHDL 3.2 09/17/2022       Latest Ref Rng & Units 12/09/2022    9:40 AM  12/03/2022    4:25 AM 12/02/2022    6:57 AM  Hepatic Function  Total Protein 6.0 - 8.5 g/dL 6.3  7.1  7.7   Albumin 3.9 - 4.9 g/dL 3.9  3.2  3.6   AST 0 - 40 IU/L 14  34  43   ALT 0 - 32 IU/L 24  38  37   Alk Phosphatase 44 - 121 IU/L 55  51  55   Total Bilirubin 0.0 - 1.2 mg/dL 0.3  0.6  0.6     No results found for: "TSH", "FREET4"     Latest Ref Rng & Units 12/09/2022    9:40 AM 12/04/2022    4:26 AM 12/03/2022    4:25 AM  CBC  WBC 3.4 - 10.8 x10E3/uL 13.7  14.2  12.8   Hemoglobin 11.1 - 15.9 g/dL 11.6  12.5  12.4   Hematocrit 34.0 - 46.6 % 35.2  37.4  38.1   Platelets 150 - 450 x10E3/uL 436  376  365     No results found for: "VD25OH", "VITAMINB12"  Clinical ASCVD: No  The 10-year ASCVD risk score (Arnett DK, et al., 2019) is: 34.4%   Values used to calculate the score:     Age: 83 years     Sex: Female     Is Non-Hispanic African American: Yes     Diabetic: Yes     Tobacco smoker: No  Systolic Blood Pressure: Q000111Q mmHg     Is BP treated: Yes     HDL Cholesterol: 51 mg/dL     Total Cholesterol: 161 mg/dL    DEXA: 12/19/22, showed osteopenia     09/17/2022    2:49 PM 06/06/2022    3:38 PM 11/07/2021    2:55 PM  Depression screen PHQ 2/9  Decreased Interest 0 0 0  Down, Depressed, Hopeless 0 0 0  PHQ - 2 Score 0 0 0  Altered sleeping  3   Tired, decreased energy  1   Change in appetite  0   Feeling bad or failure about yourself   0   Trouble concentrating  0   Moving slowly or fidgety/restless  0   Suicidal thoughts  0   PHQ-9 Score  4   Difficult doing work/chores  Somewhat difficult      Social History   Tobacco Use  Smoking Status Former   Packs/day: 0.25   Years: 10.00   Total pack years: 2.50   Types: Cigarettes  Smokeless Tobacco Never  Tobacco Comments   quit oct 2018   BP Readings from Last 3 Encounters:  12/09/22 (!) 172/64  12/04/22 (!) 145/68  10/23/22 (!) 156/68   Pulse Readings from Last 3 Encounters:  12/09/22 69  12/04/22 83   10/23/22 82   Wt Readings from Last 3 Encounters:  12/09/22 216 lb (98 kg)  11/30/22 210 lb (95.3 kg)  10/23/22 214 lb (97.1 kg)   BMI Readings from Last 3 Encounters:  12/09/22 34.86 kg/m  11/30/22 33.89 kg/m  10/23/22 34.54 kg/m    No Known Allergies  Medications Reviewed Today     Reviewed by Randal Buba, CMA (Certified Medical Assistant) on 12/09/22 at [facility] List Status: <None>   Medication Order Taking? Sig Documenting Provider Last Dose Status Informant  acetaminophen (TYLENOL) 500 MG tablet [id number] Yes Take 1,000 mg by mouth every 6 (six) hours as needed for mild pain. [provider] Taking Active Self  albuterol (VENTOLIN HFA) 108 (90 Base) MCG/ACT inhaler [id number] Yes Inhale 2 puffs into the lungs every 4 (four) hours as needed for wheezing or shortness of breath. INHALE 2 PUFFS BY MOUTH EVERY 6 HOURS AS NEEDED FOR WHEEZING FOR SHORTNESS OF BREATH Strength: 108 (90 Base) MCG/ACT Pokhrel, Laxman, MD Taking Active   Blood Glucose Monitoring Suppl (ACCU-CHEK AVIVA PLUS) w/Device KIT [id number] Yes USE AS DIRECTED TO CHECK BLOOD GLUCOSE 1 TO 2 TIMES DAILY [name], MD Taking Active Self  carvedilol (COREG) 3.125 MG tablet [id number] Yes Take 1 tablet (3.125 mg total) by mouth 2 (two) times daily with a meal. Pokhrel, Laxman, MD Taking Active   diphenhydramine-acetaminophen (TYLENOL PM) 25-500 MG TABS tablet [id number] Yes Take 1 tablet by mouth at bedtime as needed (sleep). [provider] Taking Active Self  Fluticasone-Umeclidin-Vilant (TRELEGY ELLIPTA) 200-62.5-25 MCG/ACT AEPB [id number] Yes INHALE 1 PUFF ONCE DAILY  Patient taking differently: Inhale 1 puff into the lungs daily.   [name], MD Taking Active Self  guaiFENesin-dextromethorphan [facility] DM) 100-10 MG/5ML syrup [id number] Yes Take 5 mLs by mouth every 4 (four) hours as needed for cough. Pokhrel, Laxman, MD Taking Active   metformin (FORTAMET) 1000 MG (OSM) 24 hr  tablet [id number] Yes Take 1 tablet (1,000 mg total) by mouth daily with breakfast. [name], MD Taking Active Self  montelukast (SINGULAIR) 10 MG tablet [id number] Yes Take 1 tablet (10 mg total) by mouth at  bedtime. [name], MD Taking Active Self  ondansetron [facility]) 4 MG tablet [id number] Yes Take 1 tablet (4 mg total) by mouth every 6 (six) hours. Kemper Durie, DO Taking Active Self  ondansetron (ZOFRAN-ODT) 8 MG disintegrating tablet [id number] Yes Take 1 tablet (8 mg total) by mouth every 8 (eight) hours as needed for nausea or vomiting. Peri Jefferson, PA-C Taking Active Self  pantoprazole (PROTONIX) 40 MG tablet [id number] Yes Take 1 tablet (40 mg total) by mouth daily. [name], MD Taking Active Self            SDOH:  (Social Determinants of Health) assessments and interventions performed: Yes SDOH Interventions    Flowsheet Row ED to Hosp-Admission (Discharged) from 11/30/2022 in [place] from 06/06/2022 in [place] Management from 01/20/2022 in [place] Management from 11/15/2021 in [place] Management from 10/10/2021 in [place] Interventions -- -- Other (Comment)  [patient already engaged with section 8 caseworker] Other (Comment)  [patients apartment manager has advised they will no longer accept section 8] --  Transportation Interventions Intervention Not Indicated, Inpatient TOC, Patient Resources (Friends/Family) -- -- -- --  [patient is working on SCAT]  Depression Interventions/Treatment  -- PHQ2-9 Score <4 Follow-up Not Indicated -- -- --       Medication Assistance: {MEDASSISTANCEINFO:25044}  Medication Access: Within the past 30 days, how often has patient missed a dose of medication? *** Is a pillbox or other method used to improve adherence? {YES/NO:21197} Factors  that may affect medication adherence? {CHL DESC; BARRIERS:21522} Are meds synced by current pharmacy? {YES/NO:21197} Are meds delivered by current pharmacy? {YES/NO:21197} Does patient experience delays in picking up medications due to transportation concerns? {YES/NO:21197}  [facility] Services Reviewed: Is patient disadvantaged to use [facility]?: Yes  Current Rx insurance plan: [facility] Name and location of Current pharmacy:  [place] ([place]), [place] - [address] [place] [address]) [place] [address] Phone: [phone] Fax: [phone]  [place], [place] - [address], [place], [place] [address] Phone: [phone] Fax: [phone]  [facility] services reviewed with patient today?: No  Patient requests to transfer care to [facility]?: No  Reason patient declined to change pharmacies: Disadvantaged due to insurance/mail order  Compliance/Adherence/Medication fill history: Care Gaps: Dexa - DONE Colonoscopy - Overdue PNA - Overdue Foot Exam - Overdue Flu Vaccine - Overdue TDAP - Overdue AWV- 09/17/22  Star-Rating Drugs: Metformin '1000mg'$  PDC 70% LFD 01/02/23, had hospital stay so had extra on hand   Assessment/Plan   Hypertension (BP goal <130/80) -{US controlled/uncontrolled:25276} -Current treatment: Carvedilol 3.'125mg'$  BID -Medications previously tried: Lisinopril-HCTZ (2/2 AKI at hospital 12/04/22)  -Current home readings: *** -Current dietary habits: *** -Current exercise habits: *** -{ACTIONS;DENIES/REPORTS:21021675::"Denies"} hypotensive/hypertensive symptoms -Educated on {CCM BP Counseling:25124} -Counseled to monitor BP at home ***, document, and provide log at future appointments -{CCMPHARMDINTERVENTION:25122}  Hyperlipidemia: (LDL goal < ***) -{US controlled/uncontrolled:25276} -Current treatment: *** -Medications previously  tried: ***  -Current dietary patterns: *** -Current exercise habits: *** -Educated on {CCM HLD Counseling:25126} -{CCMPHARMDINTERVENTION:25122}  COPD (Goal: control symptoms and prevent exacerbations) -{US controlled/uncontrolled:25276} -Current treatment  *** -Medications previously tried: ***  -Gold Grade: {CHL HP [facility] COPD Gold WW:[id number] -Current COPD Classification:  {CHL [facility]  COPD Class (Updated):28375} -MMRC/CAT score: *** -Pulmonary function testing: *** -Exacerbations requiring treatment in last 6 months: *** -Patient {Actions; denies-reports:120008} consistent use of maintenance inhaler -Frequency of rescue inhaler use: *** -Counseled on {CCMINHALERCOUNSELING:25121} -{CCMPHARMDINTERVENTION:25122}  Maren Reamer Clinical Pharmacist [phone]

## 2023-02-04 ENCOUNTER — Telehealth: Payer: Self-pay

## 2023-02-04 NOTE — Progress Notes (Signed)
Patient ID: [name], female   DOB: September 25, 1956, 67 y.o.   MRN: [id number]  Care Management & Coordination Services Pharmacy Team  Reason for Encounter: Appointment Reminder  Contacted patient to confirm telephone appointment with Theo Dills, PharmD on 02/05/23 at 3:30. Spoke with patient on 02/04/2023     Care Gaps: Colonoscopy - Overdue PNA - Overdue Foot Exam - Overdue Flu Vaccine - Overdue TDAP - Overdue AWV- 09/17/22   Star Rating Drugs:  Metformin 1000 mg - Last filled 01/02/23 90 DS at [place] Pharmacist Assistant [phone]

## 2023-02-05 ENCOUNTER — Ambulatory Visit: Payer: 59

## 2023-02-05 NOTE — Progress Notes (Signed)
Care Management & Coordination Services Pharmacy Note  02/05/2023 Name:  [name] MRN:  [id number] DOB:  [birth year]  Summary: BP elevated with recent med changes, pt reports it has come down since restarting lisinopril/hctz LDL not at goal >70  Recommendations/Changes made from today's visit: -Check BP once weekly to ensure BP stays <130/80 with resumed lisinopril/hctz -Consider starting rosuvastatin at next appt if patient willing, if not, suggest zetia  Follow up plan: BP call in 2 months Pharmacist call in 4 months   Subjective: [name] is an 67 y.o. year old female who is a primary patient of [name], MD.  The care coordination team was consulted for assistance with disease management and care coordination needs.    Engaged with patient by telephone for follow up visit.  Recent office visits: 12/09/22 [name], MD - Patient presented for Acute respiratory failure with hypoxia and other concerns. Prescribed Lev albuterol. Stopped Albuterol   Recent consult visits: 12/19/22 Patient presented to [facility] visits: 11/30/22 [facility] for COPD. LOS 4 days. START Carvedilol, Doxycycline, Mucinex DM, and Prednisone. STOP lisinopril-hctz, ibuprofen, BC powder   Objective:  Lab Results  Component Value Date   CREATININE 1.21 (H) 12/09/2022   BUN 25 12/09/2022   EGFR 49 (L) 12/09/2022   GFRNONAA 36 (L) 12/04/2022   GFRAA 51 (L) 01/22/2021   NA 139 12/09/2022   K 4.3 12/09/2022   CALCIUM 9.3 12/09/2022   CO2 19 (L) 12/09/2022   GLUCOSE 128 (H) 12/09/2022    Lab Results  Component Value Date/Time   HGBA1C 6.8 (A) 09/17/2022 03:54 PM   HGBA1C 7.0 (A) 06/13/2022 04:36 PM   HGBA1C 5.4 06/09/2019 12:00 AM   HGBA1C 7.7 05/12/2019 12:00 AM   MICROALBUR 41.8 09/17/2022 03:55 PM   MICROALBUR 12.0 02/27/2020 04:27 PM    Last diabetic Eye exam:  Lab Results  Component Value Date/Time    HMDIABEYEEXA No Retinopathy 08/05/2022 12:00 AM    Last diabetic Foot exam: No results found for: "HMDIABFOOTEX"   Lab Results  Component Value Date   CHOL 161 09/17/2022   HDL 51 09/17/2022   LDLCALC 95 09/17/2022   TRIG 79 09/17/2022   CHOLHDL 3.2 09/17/2022       Latest Ref Rng & Units 12/09/2022    9:40 AM 12/03/2022    4:25 AM 12/02/2022    6:57 AM  Hepatic Function  Total Protein 6.0 - 8.5 g/dL 6.3  7.1  7.7   Albumin 3.9 - 4.9 g/dL 3.9  3.2  3.6   AST 0 - 40 IU/L 14  34  43   ALT 0 - 32 IU/L 24  38  37   Alk Phosphatase 44 - 121 IU/L 55  51  55   Total Bilirubin 0.0 - 1.2 mg/dL 0.3  0.6  0.6     No results found for: "TSH", "FREET4"     Latest Ref Rng & Units 12/09/2022    9:40 AM 12/04/2022    4:26 AM 12/03/2022    4:25 AM  CBC  WBC 3.4 - 10.8 x10E3/uL 13.7  14.2  12.8   Hemoglobin 11.1 - 15.9 g/dL 11.6  12.5  12.4   Hematocrit 34.0 - 46.6 % 35.2  37.4  38.1   Platelets 150 - 450 x10E3/uL 436  376  365     No results found for: "VD25OH", "VITAMINB12"  Clinical ASCVD: No  The 10-year ASCVD risk score (Arnett DK, et al., 2019) is: 34.4%   Values used to calculate the score:     Age: 89 years     Sex: Female     Is Non-Hispanic African American: Yes     Diabetic: Yes     Tobacco smoker: No     Systolic Blood Pressure: [id number] mmHg     Is BP treated: Yes     HDL Cholesterol: 51 mg/dL     Total Cholesterol: 161 mg/dL    DEXA: 12/19/22, showed osteopenia     09/17/2022    2:49 PM 06/06/2022    3:38 PM 11/07/2021    2:55 PM  Depression screen PHQ 2/9  Decreased Interest 0 0 0  Down, Depressed, Hopeless 0 0 0  PHQ - 2 Score 0 0 0  Altered sleeping  3   Tired, decreased energy  1   Change in appetite  0   Feeling bad or failure about yourself   0   Trouble concentrating  0   Moving slowly or fidgety/restless  0   Suicidal thoughts  0   PHQ-9 Score  4   Difficult doing work/chores  Somewhat difficult      Social History   Tobacco Use  Smoking Status Former    Packs/day: 0.25   Years: 10.00   Total pack years: 2.50   Types: Cigarettes  Smokeless Tobacco Never  Tobacco Comments   quit oct 2018   BP Readings from Last 3 Encounters:  12/09/22 (!) 172/64  12/04/22 (!) 145/68  10/23/22 (!) 156/68   Pulse Readings from Last 3 Encounters:  12/09/22 69  12/04/22 83  10/23/22 82   Wt Readings from Last 3 Encounters:  12/09/22 216 lb (98 kg)  11/30/22 210 lb (95.3 kg)  10/23/22 214 lb (97.1 kg)   BMI Readings from Last 3 Encounters:  12/09/22 34.86 kg/m  11/30/22 33.89 kg/m  10/23/22 34.54 kg/m    No Known Allergies  Medications Reviewed Today     Reviewed by Randal Buba, CMA (Certified Medical Assistant) on 12/09/22 at [facility] [place] List Status: <None>   Medication Order Taking? Sig Documenting Provider Last Dose Status Informant  acetaminophen (TYLENOL) 500 MG tablet [id number] Yes Take 1,000 mg by mouth every 6 (six) hours as needed for mild pain. [provider] Taking Active Self  albuterol (VENTOLIN HFA) 108 (90 Base) MCG/ACT inhaler [id number] Yes Inhale 2 puffs into the lungs every 4 (four) hours as needed for wheezing or shortness of breath. INHALE 2 PUFFS BY MOUTH EVERY 6 HOURS AS NEEDED FOR WHEEZING FOR SHORTNESS OF BREATH Strength: 108 (90 Base) MCG/ACT Pokhrel, Laxman, MD Taking Active   Blood Glucose Monitoring Suppl (ACCU-CHEK AVIVA PLUS) w/Device KIT [id number] Yes USE AS DIRECTED TO CHECK BLOOD GLUCOSE 1 TO 2 TIMES DAILY [name], MD Taking Active Self  carvedilol (COREG) 3.125 MG tablet [id number] Yes Take 1 tablet (3.125 mg total) by mouth 2 (two) times daily with a meal. Pokhrel, Laxman, MD Taking Active   diphenhydramine-acetaminophen (TYLENOL PM) 25-500 MG TABS tablet [id number] Yes Take 1 tablet by mouth at bedtime as needed (sleep). [provider] Taking Active Self  Fluticasone-Umeclidin-Vilant (TRELEGY ELLIPTA) 200-62.5-25 MCG/ACT AEPB [id number] Yes INHALE 1 PUFF ONCE DAILY  Patient  taking differently: Inhale 1 puff into the lungs daily.   [name], MD Taking Active Self  guaiFENesin-dextromethorphan [facility] DM) 100-10 MG/5ML syrup [id number] Yes Take 5 mLs by mouth every  4 (four) hours as needed for cough. Pokhrel, Laxman, MD Taking Active   metformin (FORTAMET) 1000 MG (OSM) 24 hr tablet [id number] Yes Take 1 tablet (1,000 mg total) by mouth daily with breakfast. [name], MD Taking Active Self  montelukast (SINGULAIR) 10 MG tablet [id number] Yes Take 1 tablet (10 mg total) by mouth at bedtime. [name], MD Taking Active Self  ondansetron [facility]) 4 MG tablet [id number] Yes Take 1 tablet (4 mg total) by mouth every 6 (six) hours. Kemper Durie, DO Taking Active Self  ondansetron (ZOFRAN-ODT) 8 MG disintegrating tablet [id number] Yes Take 1 tablet (8 mg total) by mouth every 8 (eight) hours as needed for nausea or vomiting. Peri Jefferson, PA-C Taking Active Self  pantoprazole (PROTONIX) 40 MG tablet [id number] Yes Take 1 tablet (40 mg total) by mouth daily. [name], MD Taking Active Self            SDOH:  (Social Determinants of Health) assessments and interventions performed: Yes SDOH Interventions    Flowsheet Row ED to Hosp-Admission (Discharged) from 11/30/2022 in [facility] from 06/06/2022 in [facility] from 01/20/2022 in [facility] from 11/15/2021 in [facility] from 10/10/2021 in [facility] Interventions -- -- Other (Comment)  [patient already engaged with section 8 caseworker] Other (Comment)  [patients apartment manager has advised they will no longer accept section 8] --  Transportation Interventions Intervention Not Indicated, Inpatient TOC, Patient Resources (Friends/Family) -- -- -- --  [patient is working on SCAT]  Depression  Interventions/Treatment  -- PHQ2-9 Score <4 Follow-up Not Indicated -- -- --       Medication Assistance: None required.  Patient affirms current coverage meets needs.  Medication Access: Within the past 30 days, how often has patient missed a dose of medication? None Is a pillbox or other method used to improve adherence? No  Factors that may affect medication adherence? no barriers identified Are meds synced by current pharmacy? No  Are meds delivered by current pharmacy? No  Does patient experience delays in picking up medications due to transportation concerns? No   [facility] Reviewed: Is patient disadvantaged to use [facility]?: Yes  Current Rx insurance plan: [facility] Name and location of Current pharmacy:  [facility] ([place]), [place] - [address] [place] ([place]) [place] [address] Phone: [phone] Fax: [place], [place] - [address], [place], [place] [address] Phone: [phone] Fax: [phone]  [facility] services reviewed with patient today?: No  Patient requests to transfer care to [facility]?: No  Reason patient declined to change pharmacies: Disadvantaged due to insurance/mail order  Compliance/Adherence/Medication fill history: Care Gaps: Dexa - DONE Colonoscopy - Overdue PNA - Overdue Foot Exam - Overdue Flu Vaccine - Overdue TDAP - Overdue AWV- 09/17/22  Star-[facility] Drugs: Metformin '1000mg'$  PDC 70% LFD 01/02/23, had hospital stay so had extra on hand   Assessment/Plan   Hypertension (BP goal <130/80) -Not ideally controlled -Current treatment: Lisinopril-HCTZ 20-12.'5mg'$  1 qd Appropriate, Effective, Safe, Accessible -Medications previously tried: Carvedilol -Current home readings: not checking -Current dietary habits: mindful of salt, not adding any to food -Current exercise habits: Limited,  tries to walk some -Denies hypotensive/hypertensive symptoms -Educated on  BP goals and benefits of medications for prevention of heart attack, stroke and kidney damage; Daily salt intake goal < 2300 mg; Exercise goal of 150 minutes per week; Importance of home blood pressure monitoring; Proper BP monitoring technique; -Counseled to monitor BP at home once weekly, document, and provide log at future appointments -Was switched to carvedilol in hospital, pt reports she has since seen cardiology and was switched back to lisinopril/hctz -Recommended to continue current medication  Hyperlipidemia: (LDL goal < 70) -Uncontrolled -Current treatment: None -Medications previously tried: atorvastatin (muscle cramps)  -Current dietary patterns: Limit fried, fatty foods -Current exercise habits: see above -Educated on Cholesterol goals;  Benefits of statin for ASCVD risk reduction; Importance of limiting foods high in cholesterol; - Recommend trying another statin in the future or the addition of zetia. Declines med changes at this time    [place] Pharmacist [phone]

## 2023-02-17 ENCOUNTER — Other Ambulatory Visit: Payer: Self-pay | Admitting: Family Medicine

## 2023-02-23 ENCOUNTER — Other Ambulatory Visit: Payer: Self-pay | Admitting: Family Medicine

## 2023-02-23 DIAGNOSIS — F17200 Nicotine dependence, unspecified, uncomplicated: Secondary | ICD-10-CM

## 2023-02-25 ENCOUNTER — Other Ambulatory Visit: Payer: Self-pay | Admitting: Family Medicine

## 2023-02-25 DIAGNOSIS — H25812 Combined forms of age-related cataract, left eye: Secondary | ICD-10-CM | POA: Diagnosis not present

## 2023-02-25 DIAGNOSIS — H25811 Combined forms of age-related cataract, right eye: Secondary | ICD-10-CM | POA: Diagnosis not present

## 2023-02-25 DIAGNOSIS — F17200 Nicotine dependence, unspecified, uncomplicated: Secondary | ICD-10-CM

## 2023-02-25 DIAGNOSIS — H47393 Other disorders of optic disc, bilateral: Secondary | ICD-10-CM | POA: Diagnosis not present

## 2023-02-26 ENCOUNTER — Other Ambulatory Visit: Payer: Self-pay | Admitting: Family Medicine

## 2023-02-26 ENCOUNTER — Telehealth: Payer: Self-pay | Admitting: Family Medicine

## 2023-02-26 DIAGNOSIS — F17200 Nicotine dependence, unspecified, uncomplicated: Secondary | ICD-10-CM

## 2023-02-26 NOTE — Telephone Encounter (Signed)
[name] called and is asking for a refill on ibuprofin 800 mg, she says her back is starting to be in pain again and she is out and she wanted to try and have some before we will be closed for the weekend. She  prefers  [facility] ([place]), [place] - [address]

## 2023-02-26 NOTE — Telephone Encounter (Signed)
Are you ok with me refilling 800mg  of ibuprofen?

## 2023-02-26 NOTE — Telephone Encounter (Signed)
Sent Dr. Redmond School a message about this already in another telephone call

## 2023-02-26 NOTE — Telephone Encounter (Signed)
refilled 

## 2023-03-29 ENCOUNTER — Other Ambulatory Visit: Payer: Self-pay | Admitting: Family Medicine

## 2023-03-29 DIAGNOSIS — K219 Gastro-esophageal reflux disease without esophagitis: Secondary | ICD-10-CM

## 2023-04-09 ENCOUNTER — Telehealth: Payer: Self-pay

## 2023-04-09 ENCOUNTER — Other Ambulatory Visit: Payer: Self-pay | Admitting: Family Medicine

## 2023-04-09 ENCOUNTER — Other Ambulatory Visit: Payer: Self-pay

## 2023-04-09 DIAGNOSIS — E118 Type 2 diabetes mellitus with unspecified complications: Secondary | ICD-10-CM

## 2023-04-09 DIAGNOSIS — J4530 Mild persistent asthma, uncomplicated: Secondary | ICD-10-CM

## 2023-04-09 DIAGNOSIS — J441 Chronic obstructive pulmonary disease with (acute) exacerbation: Secondary | ICD-10-CM

## 2023-04-09 MED ORDER — METFORMIN HCL 500 MG PO TABS: 500.0000 mg | ORAL_TABLET | Freq: Two times a day (BID) | ORAL | 4 refills | Status: DC

## 2023-04-09 MED ORDER — LEVALBUTEROL TARTRATE 45 MCG/ACT IN AERO: INHALATION_SPRAY | RESPIRATORY_TRACT | 1 refills | Status: DC

## 2023-04-09 NOTE — Telephone Encounter (Signed)
Pt called to advise the 1,000 metformin pills are too big and she has started back taking the old prescription for metformin 500 mg twice a day. Ok to send rx for this?

## 2023-04-09 NOTE — Telephone Encounter (Signed)
Refill sent. Also sent refill for inhaler. We received a request via in basket. The requested inhaler was discontinued in December. I spoke with the pt and she requested to have the recently prescribed one sent in instead.

## 2023-04-09 NOTE — Telephone Encounter (Signed)
error 

## 2023-05-03 ENCOUNTER — Other Ambulatory Visit: Payer: Self-pay | Admitting: Family Medicine

## 2023-05-03 DIAGNOSIS — R609 Edema, unspecified: Secondary | ICD-10-CM

## 2023-05-07 ENCOUNTER — Telehealth: Payer: Self-pay

## 2023-05-07 MED ORDER — FUROSEMIDE 20 MG PO TABS: 20.0000 mg | ORAL_TABLET | Freq: Every day | ORAL | 3 refills | Status: DC

## 2023-05-07 NOTE — Telephone Encounter (Signed)
Pt called back again to check on status. Pt states that he son passed away and she has to fly out of town tomorrow at 6:00 pm.  She is afraid that her legs will be ready bad after flying.

## 2023-05-07 NOTE — Telephone Encounter (Signed)
Patient called requesting a refill on Furosemide. She reports retaining fluid  in both legs. Her son just passed away, and she is trying to get his final arrangements taken care of. She will be traveling to [place] tomorrow, and would like a refill on Furosemide before she leaves. This medication is not listed on her active medication list. Please advise on refill request.

## 2023-05-07 NOTE — Telephone Encounter (Signed)
Patient advised.

## 2023-05-15 ENCOUNTER — Ambulatory Visit (INDEPENDENT_AMBULATORY_CARE_PROVIDER_SITE_OTHER): Payer: 59 | Admitting: Podiatry

## 2023-05-15 DIAGNOSIS — L600 Ingrowing nail: Secondary | ICD-10-CM | POA: Diagnosis not present

## 2023-05-17 NOTE — Progress Notes (Signed)
       Subjective:  Patient ID: [name], female    DOB: 1956-06-02,  MRN: [id number]  [name] presents to clinic today for:  Chief Complaint  Patient presents with   Ingrown Toenail    LEFT GREAT TOE INGROWN- Medial border. Denies any pus or bleeding. Pre-diabetic.   Marland Kitchen  PCP is Ronnald Nian, MD. PCP was last seen on 12/09/2022.  No Known Allergies  Review of Systems: Negative except as noted in the HPI.  Objective:  There were no vitals filed for this visit.  [name] is a pleasant 66 y.o. female in NAD. AAO x 3.  Vascular Examination: Capillary refill time is 3-5 seconds to toes bilateral. Palpable pedal pulses b/l LE. Digital hair present b/l. No pedal edema b/l. Skin temperature gradient WNL b/l. No varicosities b/l. No cyanosis or clubbing noted b/l.   Dermatological Examination: There is incurvation of the left hallux medial nail border.  There is pain on palpation of the affected nail border.  There is no purulence.  There is minimal localized edema and erythema present.  Neurological Examination: Protective sensation intact with Semmes-Weinstein 10 gram monofilament b/l LE.      Latest Ref Rng & Units 09/17/2022    3:54 PM 06/13/2022    4:36 PM  Hemoglobin A1C  Hemoglobin-A1c 4.0 - 5.6 % 6.8  7.0     Assessment/Plan: 1. Ingrown toenail     Discussed patient's condition today.  After obtaining patient consent, the left great toe was anesthetized with a 50:50 mixture of 1% lidocaine plain and 0.5% bupivacaine plain for a total of 3cc's administered.  Upon confirmation of anesthesia, a freer elevator was utilized to free the left hallux medial nail border from the nail bed.  The nail border was then avulsed proximal to the eponychium and removed in toto.  The area was inspected for any remaining spicules.  A chemical matrixectomy was performed with phenol and neutralized with alcohol solution.  Antibiotic ointment and a DSD were  applied, followed by a Coban dressing.  Patient tolerated the anesthetic and procedure well and will f/u in 2-3 weeks for recheck.  Patient given post-procedure instructions for Epsom salt soaks, antibiotic ointment and daily use of Bandaids until toe starts to dry / form eschar.    Return in about 2 weeks (around 05/29/2023) for PNA recheck L-1 med.   Clerance Lav, DPM, FACFAS [facility]     [address] [place], [place] [address]                Office [phone]  Fax [phone]

## 2023-05-25 DIAGNOSIS — H2513 Age-related nuclear cataract, bilateral: Secondary | ICD-10-CM | POA: Diagnosis not present

## 2023-05-25 DIAGNOSIS — H25811 Combined forms of age-related cataract, right eye: Secondary | ICD-10-CM | POA: Diagnosis not present

## 2023-05-25 HISTORY — PX: CATARACT EXTRACTION: SUR2

## 2023-06-03 ENCOUNTER — Telehealth: Payer: Self-pay | Admitting: Podiatry

## 2023-06-03 NOTE — Telephone Encounter (Signed)
Pt left message today at 237pm stating she had procedure on toe and talked with the doctor on Monday and was told to soak toe in iodine. She has been and toe is turning yellow and till sore and she thinks she may need to see the doctor. Please advise. Pt left 2 numbers the home and [phone] which is her cell.

## 2023-06-08 NOTE — Telephone Encounter (Signed)
Returned patient's call.  Informed her the yellow discoloration is from iodine staining the skin, and that's fine.  It will fade away once she stops.  She may continue to use a very small amount of neosporin on the ingrown toenail area, but we want to encourage it to dry out and scab over.    [name] is not for the ingrown toenail procedure right now.  Don't apply that to the ingrown nail area.  F/U as scheduled.

## 2023-06-11 ENCOUNTER — Ambulatory Visit: Payer: 59

## 2023-06-16 ENCOUNTER — Ambulatory Visit: Payer: 59

## 2023-06-16 VITALS — Ht 66.5 in | Wt 232.0 lb

## 2023-06-16 DIAGNOSIS — Z0000 Encounter for general adult medical examination without abnormal findings: Secondary | ICD-10-CM

## 2023-06-16 NOTE — Patient Instructions (Signed)
Ms. [name] , Thank you for taking time to come for your Medicare Wellness Visit. I appreciate your ongoing commitment to your health goals. Please review the following plan we discussed and let me know if I can assist you in the future.   These are the goals we discussed:  Goals      Monitor and Manage My Blood Sugar-Diabetes Type 2     Timeframe:  Long-Range Goal Priority:  High Start Date:                             Expected End Date:                       Follow Up Date 12/10/21    - check blood sugar at prescribed times - check blood sugar if I feel it is too high or too low    Why is this important?   Checking your blood sugar at home helps to keep it from getting very high or very low.  Writing the results in a diary or log helps the doctor know how to care for you.  Your blood sugar log should have the time, date and the results.  Also, write down the amount of insulin or other medicine that you take.  Other information, like what you ate, exercise done and how you were feeling, will also be helpful.     Notes:      Patient Stated     06/06/2022, wants to be able to be pain free     Patient Stated     06/16/2023, wants to lose weight     Track and Manage My Blood Pressure-Hypertension     Timeframe:  Long-Range Goal Priority:  Medium Start Date:                             Expected End Date:                       Follow Up Date 12/10/21    - check blood pressure weekly - choose a place to take my blood pressure (home, clinic or office, retail store) - write blood pressure results in a log or diary    Why is this important?   You won't feel high blood pressure, but it can still hurt your blood vessels.  High blood pressure can cause heart or kidney problems. It can also cause a stroke.  Making lifestyle changes like losing a little weight or eating less salt will help.  Checking your blood pressure at home and at different times of the day can help to control  blood pressure.  If the doctor prescribes medicine remember to take it the way the doctor ordered.  Call the office if you cannot afford the medicine or if there are questions about it.     Notes:         This is a list of the screening recommended for you and due dates:  Health Maintenance  Topic Date Due   Colon Cancer Screening  07/01/2021   Pneumonia Vaccine (3 of 3 - PPSV23 or PCV20) 03/06/2022   DTaP/Tdap/Td vaccine (2 - Td or Tdap) 08/13/2022   COVID-19 Vaccine (6 - 2023-24 season) 03/08/2023   Hemoglobin A1C  03/19/2023   Flu Shot  07/02/2023   Eye exam for diabetics  08/06/2023   Yearly kidney health urinalysis for diabetes  09/18/2023   Yearly kidney function blood test for diabetes  12/10/2023   Complete foot exam   05/14/2024   Medicare Annual Wellness Visit  06/15/2024   Mammogram  12/19/2024   DEXA scan (bone density measurement)  Completed   Hepatitis C Screening  Completed   Zoster (Shingles) Vaccine  Completed   HPV Vaccine  Aged Out    Advanced directives: Advance directive discussed with you today.   Conditions/risks identified: none  Next appointment: Follow up in one year for your annual wellness visit    Preventive Care 65 Years and Older, Female Preventive care refers to lifestyle choices and visits with your health care provider that can promote health and wellness. What does preventive care include? A yearly physical exam. This is also called an annual well check. Dental exams once or twice a year. Routine eye exams. Ask your health care provider how often you should have your eyes checked. Personal lifestyle choices, including: Daily care of your teeth and gums. Regular physical activity. Eating a healthy diet. Avoiding tobacco and drug use. Limiting alcohol use. Practicing safe sex. Taking low-dose aspirin every day. Taking vitamin and mineral supplements as recommended by your health care provider. What happens during an annual well  check? The services and screenings done by your health care provider during your annual well check will depend on your age, overall health, lifestyle risk factors, and family history of disease. Counseling  Your health care provider may ask you questions about your: Alcohol use. Tobacco use. Drug use. Emotional well-being. Home and relationship well-being. Sexual activity. Eating habits. History of falls. Memory and ability to understand (cognition). Work and work Astronomer. Reproductive health. Screening  You may have the following tests or measurements: Height, weight, and BMI. Blood pressure. Lipid and cholesterol levels. These may be checked every 5 years, or more frequently if you are over 76 years old. Skin check. Lung cancer screening. You may have this screening every year starting at age 64 if you have a 30-pack-year history of smoking and currently smoke or have quit within the past 15 years. Fecal occult blood test (FOBT) of the stool. You may have this test every year starting at age 42. Flexible sigmoidoscopy or colonoscopy. You may have a sigmoidoscopy every 5 years or a colonoscopy every 10 years starting at age 19. Hepatitis C blood test. Hepatitis B blood test. Sexually transmitted disease (STD) testing. Diabetes screening. This is done by checking your blood sugar (glucose) after you have not eaten for a while (fasting). You may have this done every 1-3 years. Bone density scan. This is done to screen for osteoporosis. You may have this done starting at age 43. Mammogram. This may be done every 1-2 years. Talk to your health care provider about how often you should have regular mammograms. Talk with your health care provider about your test results, treatment options, and if necessary, the need for more tests. Vaccines  Your health care provider may recommend certain vaccines, such as: Influenza vaccine. This is recommended every year. Tetanus, diphtheria, and  acellular pertussis (Tdap, Td) vaccine. You may need a Td booster every 10 years. Zoster vaccine. You may need this after age 38. Pneumococcal 13-valent conjugate (PCV13) vaccine. One dose is recommended after age 67. Pneumococcal polysaccharide (PPSV23) vaccine. One dose is recommended after age 32. Talk to your health care provider about which screenings and vaccines you need and how often you need  them. This information is not intended to replace advice given to you by your health care provider. Make sure you discuss any questions you have with your health care provider. Document Released: 12/14/2015 Document Revised: 08/06/2016 Document Reviewed: 09/18/2015 Elsevier Interactive Patient Education  2017 [employer].  Fall Prevention in the Home Falls can cause injuries. They can happen to people of all ages. There are many things you can do to make your home safe and to help prevent falls. What can I do on the outside of my home? Regularly fix the edges of walkways and driveways and fix any cracks. Remove anything that might make you trip as you walk through a door, such as a raised step or threshold. Trim any bushes or trees on the path to your home. Use bright outdoor lighting. Clear any walking paths of anything that might make someone trip, such as rocks or tools. Regularly check to see if handrails are loose or broken. Make sure that both sides of any steps have handrails. Any raised decks and porches should have guardrails on the edges. Have any leaves, snow, or ice cleared regularly. Use sand or salt on walking paths during winter. Clean up any spills in your garage right away. This includes oil or grease spills. What can I do in the bathroom? Use night lights. Install grab bars by the toilet and in the tub and shower. Do not use towel bars as grab bars. Use non-skid mats or decals in the tub or shower. If you need to sit down in the shower, use a plastic, non-slip stool. Keep  the floor dry. Clean up any water that spills on the floor as soon as it happens. Remove soap buildup in the tub or shower regularly. Attach bath mats securely with double-sided non-slip rug tape. Do not have throw rugs and other things on the floor that can make you trip. What can I do in the bedroom? Use night lights. Make sure that you have a light by your bed that is easy to reach. Do not use any sheets or blankets that are too big for your bed. They should not hang down onto the floor. Have a firm chair that has side arms. You can use this for support while you get dressed. Do not have throw rugs and other things on the floor that can make you trip. What can I do in the kitchen? Clean up any spills right away. Avoid walking on wet floors. Keep items that you use a lot in easy-to-reach places. If you need to reach something above you, use a strong step stool that has a grab bar. Keep electrical cords out of the way. Do not use floor polish or wax that makes floors slippery. If you must use wax, use non-skid floor wax. Do not have throw rugs and other things on the floor that can make you trip. What can I do with my stairs? Do not leave any items on the stairs. Make sure that there are handrails on both sides of the stairs and use them. Fix handrails that are broken or loose. Make sure that handrails are as long as the stairways. Check any carpeting to make sure that it is firmly attached to the stairs. Fix any carpet that is loose or worn. Avoid having throw rugs at the top or bottom of the stairs. If you do have throw rugs, attach them to the floor with carpet tape. Make sure that you have a light switch at the  top of the stairs and the bottom of the stairs. If you do not have them, ask someone to add them for you. What else can I do to help prevent falls? Wear shoes that: Do not have high heels. Have rubber bottoms. Are comfortable and fit you well. Are closed at the toe. Do not  wear sandals. If you use a stepladder: Make sure that it is fully opened. Do not climb a closed stepladder. Make sure that both sides of the stepladder are locked into place. Ask someone to hold it for you, if possible. Clearly mark and make sure that you can see: Any grab bars or handrails. First and last steps. Where the edge of each step is. Use tools that help you move around (mobility aids) if they are needed. These include: Canes. Walkers. Scooters. Crutches. Turn on the lights when you go into a dark area. Replace any light bulbs as soon as they burn out. Set up your furniture so you have a clear path. Avoid moving your furniture around. If any of your floors are uneven, fix them. If there are any pets around you, be aware of where they are. Review your medicines with your doctor. Some medicines can make you feel dizzy. This can increase your chance of falling. Ask your doctor what other things that you can do to help prevent falls. This information is not intended to replace advice given to you by your health care provider. Make sure you discuss any questions you have with your health care provider. Document Released: 09/13/2009 Document Revised: 04/24/2016 Document Reviewed: 12/22/2014 Elsevier Interactive Patient Education  2017 [employer].

## 2023-06-16 NOTE — Progress Notes (Signed)
Subjective:   [name] is a 67 y.o. female who presents for Medicare Annual (Subsequent) preventive examination.  Visit Complete: Virtual  I connected with  [name] on 06/16/23 by a audio enabled telemedicine application and verified that I am speaking with the correct person using two identifiers.  Patient Location: Home  Provider Location: Office/Clinic  I discussed the limitations of evaluation and management by telemedicine. The patient expressed understanding and agreed to proceed.  Per patient no change in vitals since last visit, unable to obtain new vitals due to telehealth visit  Review of Systems     Cardiac Risk Factors include: advanced age (>45men, >58 women);diabetes mellitus;hypertension;obesity (BMI >30kg/m2)     Objective:    Today's Vitals   06/16/23 1457 06/16/23 1459  Weight: 232 lb (105.2 kg)   Height: 5' 6.5" (1.689 m)   PainSc:  3    Body mass index is 36.88 kg/m.     06/16/2023    3:10 PM 11/30/2022   10:10 AM 06/06/2022    3:35 PM 05/27/2021    2:54 PM 08/30/2020    9:38 AM 06/25/2018    6:32 PM 05/25/2018   10:41 AM  Advanced Directives  Does Patient Have a Medical Advance Directive? No No No No No No No  Would patient like information on creating a medical advance directive?    Yes (MAU/Ambulatory/Procedural Areas - Information given) Yes (ED - Information included in AVS)  Yes (MAU/Ambulatory/Procedural Areas - Information given)    Current Medications (verified) Outpatient Encounter Medications as of 06/16/2023  Medication Sig   acetaminophen (TYLENOL) 500 MG tablet Take 1,000 mg by mouth every 6 (six) hours as needed for mild pain.   Blood Glucose Monitoring Suppl (ACCU-CHEK AVIVA PLUS) w/Device KIT USE AS DIRECTED TO CHECK BLOOD GLUCOSE 1 TO 2 TIMES DAILY   diphenhydramine-acetaminophen (TYLENOL PM) 25-500 MG TABS tablet Take 1 tablet by mouth at bedtime as needed (sleep).   Fluticasone-Umeclidin-Vilant (TRELEGY  ELLIPTA) 200-62.5-25 MCG/ACT AEPB INHALE 1 PUFF ONCE DAILY   furosemide (LASIX) 20 MG tablet Take 1 tablet (20 mg total) by mouth daily.   guaiFENesin-dextromethorphan (ROBITUSSIN DM) 100-10 MG/5ML syrup Take 5 mLs by mouth every 4 (four) hours as needed for cough.   ibuprofen (ADVIL) 800 MG tablet Take 1 tablet (800 mg total) by mouth every 8 (eight) hours as needed for mild pain.   levalbuterol (XOPENEX HFA) 45 MCG/ACT inhaler INHALE 2 PUFFS BY MOUTH EVERY 4 HOURS AS NEEDED FOR WHEEZING   lisinopril-hydrochlorothiazide (ZESTORETIC) 20-12.5 MG tablet Take 1 tablet by mouth daily.   metFORMIN (GLUCOPHAGE) 500 MG tablet Take 1 tablet (500 mg total) by mouth 2 (two) times daily with a meal.   montelukast (SINGULAIR) 10 MG tablet TAKE 1 TABLET BY MOUTH AT BEDTIME   ondansetron (ZOFRAN) 4 MG tablet Take 1 tablet (4 mg total) by mouth every 6 (six) hours.   ondansetron (ZOFRAN-ODT) 8 MG disintegrating tablet Take 1 tablet (8 mg total) by mouth every 8 (eight) hours as needed for nausea or vomiting.   pantoprazole (PROTONIX) 40 MG tablet Take 1 tablet by mouth once daily   No facility-administered encounter medications on file as of 06/16/2023.    Allergies (verified) Patient has no known allergies.   History: Past Medical History:  Diagnosis Date   Arthritis    Asthma    Colonic polyp    GERD (gastroesophageal reflux disease)    Hypertension    Obesity  Pain in limb    Smoker    Swelling of limb    Past Surgical History:  Procedure Laterality Date   BIOPSY BREAST Left 09/22/2018   no maglignancy   CATARACT EXTRACTION Right 05/25/2023   COLONOSCOPY  2009   Dr. Marina Goodell   POLYPECTOMY     Family History  Problem Relation Age of Onset   Lung cancer Father    Cirrhosis Mother    Cirrhosis Brother    Colon cancer Neg Hx    Colon polyps Neg Hx    Esophageal cancer Neg Hx    Rectal cancer Neg Hx    Stomach cancer Neg Hx    Social History   Socioeconomic History   Marital  status: Widowed    Spouse name: Not on file   Number of children: Not on file   Years of education: Not on file   Highest education level: Not on file  Occupational History   Not on file  Tobacco Use   Smoking status: Former    Current packs/day: 0.25    Average packs/day: 0.3 packs/day for 10.0 years (2.5 ttl pk-yrs)    Types: Cigarettes   Smokeless tobacco: Never   Tobacco comments:    quit oct 2018  Vaping Use   Vaping status: Never Used  Substance and Sexual Activity   Alcohol use: Not Currently    Comment: socially   Drug use: Not Currently    Types: Marijuana    Comment: social   Sexual activity: Not Currently  Other Topics Concern   Not on file  Social History Narrative   Not on file   Social Determinants of Health   Financial Resource Strain: Low Risk  (06/16/2023)   Overall Financial Resource Strain (CARDIA)    Difficulty of Paying Living Expenses: Not hard at all  Food Insecurity: No Food Insecurity (06/16/2023)   Hunger Vital Sign    Worried About Running Out of Food in the Last Year: Never true    Ran Out of Food in the Last Year: Never true  Transportation Needs: No Transportation Needs (06/16/2023)   PRAPARE - Administrator, Civil Service (Medical): No    Lack of Transportation (Non-Medical): No  Physical Activity: Inactive (06/16/2023)   Exercise Vital Sign    Days of Exercise per Week: 0 days    Minutes of Exercise per Session: 0 min  Stress: Stress Concern Present (06/16/2023)   [employer] of Occupational Health - Occupational Stress Questionnaire    Feeling of Stress : To some extent  Social Connections: Socially Isolated (06/16/2023)   Social Connection and Isolation Panel [NHANES]    Frequency of Communication with Friends and Family: More than three times a week    Frequency of Social Gatherings with Friends and Family: Never    Attends Religious Services: Never    Database administrator or Organizations: No    Attends Tax inspector Meetings: Never    Marital Status: Widowed    Tobacco Counseling Counseling given: Not Answered Tobacco comments: quit oct 2018   Clinical Intake:  Pre-visit preparation completed: Yes  Pain : No/denies pain Pain Score: 3  Pain Type: Chronic pain Pain Location: Back Pain Orientation: Lower Pain Descriptors / Indicators: Aching Pain Onset: More than a month ago Pain Frequency: Constant     Nutritional Status: BMI > 30  Obese Nutritional Risks: None Diabetes: Yes CBG done?: No Did pt. bring in CBG monitor from home?: No  How often do you need to have someone help you when you read instructions, pamphlets, or other written materials from your doctor or pharmacy?: 1 - Never  Interpreter Needed?: No  Information entered by :: NAllen LPN   Activities of Daily Living    06/16/2023    3:01 PM  In your present state of health, do you have any difficulty performing the following activities:  Hearing? 0  Vision? 0  Difficulty concentrating or making decisions? 0  Walking or climbing stairs? 1  Dressing or bathing? 0  Doing errands, shopping? 0  Preparing Food and eating ? N  Using the Toilet? N  In the past six months, have you accidently leaked urine? Y  Do you have problems with loss of bowel control? N  Managing your Medications? N  Managing your Finances? N  Housekeeping or managing your Housekeeping? N    Patient Care Team: Ronnald Nian, MD as PCP - General (Family Medicine) Bevelyn Ngo as [facility] Care Management Ewen, Milas Kocher, [facility] (Inactive) (Pharmacist)  Indicate any recent Medical Services you may have received from other than [facility] providers in the past year (date may be approximate).     Assessment:   This is a routine wellness examination for Gwendlyon.  Hearing/Vision screen Hearing Screening - Comments:: Denies hearing issues Vision Screening - Comments:: Regular eye exams, [facility]  Dietary issues  and exercise activities discussed:     Goals Addressed             This Visit's Progress    Patient Stated       06/16/2023, wants to lose weight       Depression Screen    06/16/2023    3:12 PM 09/17/2022    2:49 PM 06/06/2022    3:38 PM 11/07/2021    2:55 PM 08/30/2020    9:41 AM 05/25/2018   10:06 AM 08/18/2017    3:16 PM  PHQ 2/9 Scores  PHQ - 2 Score 1 0 0 0 0 0 0  PHQ- 9 Score 5  4        Fall Risk    06/16/2023    3:11 PM 09/17/2022    2:49 PM 06/06/2022    3:37 PM 03/19/2022    4:26 PM 08/30/2020    9:40 AM  Fall Risk   Falls in the past year? 0 0 0 0 0  Number falls in past yr: 0 0 0 0   Injury with Fall? 0 0 0 0   Risk for fall due to : Medication side effect No Fall Risks Medication side effect No Fall Risks No Fall Risks  Follow up Falls prevention discussed;Falls evaluation completed Falls evaluation completed Falls evaluation completed;Education provided;Falls prevention discussed Falls evaluation completed     MEDICARE RISK AT HOME:  Medicare Risk at Home - 06/16/23 1511     Any stairs in or around the home? Yes    If so, are there any without handrails? No    Home free of loose throw rugs in walkways, pet beds, electrical cords, etc? Yes    Adequate lighting in your home to reduce risk of falls? Yes    Life alert? No    Use of a cane, walker or w/c? No    Grab bars in the bathroom? Yes    Shower chair or bench in shower? Yes    Elevated toilet seat or a handicapped toilet? No  TIMED UP AND GO:  Was the test performed?  No    Cognitive Function:        06/16/2023    3:13 PM 06/06/2022    3:42 PM  6CIT Screen  What Year? 0 points 0 points  What month? 0 points 0 points  What time? 0 points 0 points  Count back from 20 0 points 0 points  Months in reverse 0 points 0 points  Repeat phrase 0 points 2 points  Total Score 0 points 2 points    Immunizations Immunization History  Administered Date(s) Administered   COVID-19,  mRNA, vaccine(Comirnaty)12 years and older 11/06/2022   Hepatitis B 08/13/2012, 09/10/2012   Hepatitis B, ADULT 09/28/2013   Moderna Covid-19 Vaccine Bivalent Booster 29yrs & up 11/07/2021   Moderna SARS-COV2 Booster Vaccination 11/08/2020   Moderna Sars-Covid-2 Vaccination 03/08/2020, 04/03/2020   PPD Test 08/08/2014, 09/10/2015, 09/01/2016   Pneumococcal Conjugate-13 03/06/2017   Pneumococcal Polysaccharide-23 08/13/2012   Tdap 08/13/2012   Zoster Recombinant(Shingrix) 01/12/2017, 07/18/2017   Zoster, Live 10/10/2014    TDAP status: Up to date  Flu Vaccine status: Up to date  Pneumococcal vaccine status: Up to date  Covid-19 vaccine status: Completed vaccines  Qualifies for Shingles Vaccine? Yes   Zostavax completed Yes   Shingrix Completed?: Yes  Screening Tests Health Maintenance  Topic Date Due   Colonoscopy  07/01/2021   Pneumonia Vaccine 64+ Years old (3 of 3 - PPSV23 or PCV20) 03/06/2022   DTaP/Tdap/Td (2 - Td or Tdap) 08/13/2022   COVID-19 Vaccine (6 - 2023-24 season) 03/08/2023   HEMOGLOBIN A1C  03/19/2023   INFLUENZA VACCINE  07/02/2023   OPHTHALMOLOGY EXAM  08/06/2023   Diabetic kidney evaluation - Urine ACR  09/18/2023   Diabetic kidney evaluation - eGFR measurement  12/10/2023   FOOT EXAM  05/14/2024   Medicare Annual Wellness (AWV)  06/15/2024   MAMMOGRAM  12/19/2024   DEXA SCAN  Completed   Hepatitis C Screening  Completed   Zoster Vaccines- Shingrix  Completed   HPV VACCINES  Aged Out    Health Maintenance  Health Maintenance Due  Topic Date Due   Colonoscopy  07/01/2021   Pneumonia Vaccine 10+ Years old (3 of 3 - PPSV23 or PCV20) 03/06/2022   DTaP/Tdap/Td (2 - Td or Tdap) 08/13/2022   COVID-19 Vaccine (6 - 2023-24 season) 03/08/2023   HEMOGLOBIN A1C  03/19/2023    Colorectal cancer screening: Type of screening: Colonoscopy. Completed 07/01/2018. Repeat every 3 years  Mammogram status: Completed 12/19/2022. Repeat every year  Bone Density  status: Completed 12/19/2022.  Lung Cancer Screening: (Low Dose CT Chest recommended if Age 45-80 years, 20 pack-year currently smoking OR have quit w/in 15years.) does not qualify.   Lung Cancer Screening Referral: no  Additional Screening:  Hepatitis C Screening: does qualify; Completed 08/30/2020  Vision Screening: Recommended annual ophthalmology exams for early detection of glaucoma and other disorders of the eye. Is the patient up to date with their annual eye exam?  Yes  Who is the provider or what is the name of the office in which the patient attends annual eye exams? [facility] If pt is not established with a provider, would they like to be referred to a provider to establish care? No .   Dental Screening: Recommended annual dental exams for proper oral hygiene  Diabetic Foot Exam: Diabetic Foot Exam: Completed 05/15/2023  Community Resource Referral / Chronic Care Management: CRR required this visit?  No   CCM required  this visit?  No     Plan:     I have personally reviewed and noted the following in the patient's chart:   Medical and social history Use of alcohol, tobacco or illicit drugs  Current medications and supplements including opioid prescriptions. Patient is not currently taking opioid prescriptions. Functional ability and status Nutritional status Physical activity Advanced directives List of other physicians Hospitalizations, surgeries, and ER visits in previous 12 months Vitals Screenings to include cognitive, depression, and falls Referrals and appointments  In addition, I have reviewed and discussed with patient certain preventive protocols, quality metrics, and best practice recommendations. A written personalized care plan for preventive services as well as general preventive health recommendations were provided to patient.     Barb Merino, LPN   [id number]/[id number]   After Visit Summary: (MyChart) Due to this being a telephonic visit, the after  visit summary with patients personalized plan was offered to patient via MyChart   Nurse Notes: none

## 2023-06-20 ENCOUNTER — Other Ambulatory Visit: Payer: Self-pay | Admitting: Family Medicine

## 2023-06-20 DIAGNOSIS — F17200 Nicotine dependence, unspecified, uncomplicated: Secondary | ICD-10-CM

## 2023-06-29 DIAGNOSIS — H2513 Age-related nuclear cataract, bilateral: Secondary | ICD-10-CM | POA: Diagnosis not present

## 2023-06-29 DIAGNOSIS — H25812 Combined forms of age-related cataract, left eye: Secondary | ICD-10-CM | POA: Diagnosis not present

## 2023-06-29 LAB — HM DIABETES EYE EXAM

## 2023-07-13 ENCOUNTER — Telehealth: Payer: Self-pay | Admitting: Family Medicine

## 2023-07-13 DIAGNOSIS — E118 Type 2 diabetes mellitus with unspecified complications: Secondary | ICD-10-CM

## 2023-07-13 MED ORDER — METFORMIN HCL 500 MG PO TABS: 500.0000 mg | ORAL_TABLET | Freq: Two times a day (BID) | ORAL | 0 refills | Status: DC

## 2023-07-13 NOTE — Telephone Encounter (Signed)
Fax from [place]  Metformin 500 mg  Pt requesting #[phone] for 90 day supply

## 2023-07-13 NOTE — Telephone Encounter (Signed)
Sent in med

## 2023-07-15 DIAGNOSIS — G729 Myopathy, unspecified: Secondary | ICD-10-CM | POA: Insufficient documentation

## 2023-07-15 NOTE — Progress Notes (Deleted)
o

## 2023-07-16 ENCOUNTER — Other Ambulatory Visit: Payer: Self-pay | Admitting: Family Medicine

## 2023-07-17 NOTE — Telephone Encounter (Signed)
Last apt 12/09/22.

## 2023-07-20 ENCOUNTER — Ambulatory Visit (INDEPENDENT_AMBULATORY_CARE_PROVIDER_SITE_OTHER): Payer: 59 | Admitting: Podiatry

## 2023-07-20 ENCOUNTER — Encounter: Payer: Self-pay | Admitting: Podiatry

## 2023-07-20 DIAGNOSIS — L600 Ingrowing nail: Secondary | ICD-10-CM | POA: Diagnosis not present

## 2023-07-22 NOTE — Progress Notes (Signed)
Subjective:   Patient ID: [name], female   DOB: 67 y.o.   MRN: [id number]   HPI Patient states still having some redness and irritation in the left big toe and states that it is moderately tender   ROS      Objective:  Physical Exam  Neurovascular status is unchanged from previous with the patient who is a diabetic does have inflammation around this corner but it is localized with crusted tissue no proximal edema erythema or drainage was noted     Assessment:  Appears to be a scab-like formation left hallux medial border localized with the possibility of low-grade paronychia     Plan:  Sterile prep and I went ahead using sterile instrumentation debrided the area I did not note any current drainage I advised on continued soaks bandage usage should heal uneventfully reappoint as needed

## 2023-08-31 ENCOUNTER — Telehealth: Payer: Self-pay

## 2023-08-31 NOTE — Progress Notes (Signed)
08/31/2023  Patient ID: [name], female   DOB: 01/22/1956, 67 y.o.   MRN: [id number]  Attempted to contact patient for scheduled appointment for medication management. Left HIPAA compliant message for patient to return my call at their convenience.   Sherrill Raring, PharmD Clinical Pharmacist [phone]

## 2023-09-24 ENCOUNTER — Ambulatory Visit (INDEPENDENT_AMBULATORY_CARE_PROVIDER_SITE_OTHER): Payer: 59 | Admitting: Family Medicine

## 2023-09-24 ENCOUNTER — Encounter: Payer: Self-pay | Admitting: Family Medicine

## 2023-09-24 VITALS — BP 128/68 | HR 76 | Ht 64.0 in | Wt 236.0 lb

## 2023-09-24 DIAGNOSIS — M5489 Other dorsalgia: Secondary | ICD-10-CM | POA: Diagnosis not present

## 2023-09-24 DIAGNOSIS — Z9849 Cataract extraction status, unspecified eye: Secondary | ICD-10-CM

## 2023-09-24 DIAGNOSIS — J4530 Mild persistent asthma, uncomplicated: Secondary | ICD-10-CM

## 2023-09-24 DIAGNOSIS — Z860100 Personal history of colon polyps, unspecified: Secondary | ICD-10-CM

## 2023-09-24 DIAGNOSIS — H25019 Cortical age-related cataract, unspecified eye: Secondary | ICD-10-CM

## 2023-09-24 DIAGNOSIS — G8929 Other chronic pain: Secondary | ICD-10-CM

## 2023-09-24 DIAGNOSIS — J301 Allergic rhinitis due to pollen: Secondary | ICD-10-CM

## 2023-09-24 DIAGNOSIS — E669 Obesity, unspecified: Secondary | ICD-10-CM | POA: Diagnosis not present

## 2023-09-24 DIAGNOSIS — I152 Hypertension secondary to endocrine disorders: Secondary | ICD-10-CM

## 2023-09-24 DIAGNOSIS — E1159 Type 2 diabetes mellitus with other circulatory complications: Secondary | ICD-10-CM

## 2023-09-24 DIAGNOSIS — E1169 Type 2 diabetes mellitus with other specified complication: Secondary | ICD-10-CM

## 2023-09-24 DIAGNOSIS — N1831 Chronic kidney disease, stage 3a: Secondary | ICD-10-CM | POA: Diagnosis not present

## 2023-09-24 DIAGNOSIS — E118 Type 2 diabetes mellitus with unspecified complications: Secondary | ICD-10-CM | POA: Diagnosis not present

## 2023-09-24 DIAGNOSIS — J438 Other emphysema: Secondary | ICD-10-CM | POA: Diagnosis not present

## 2023-09-24 DIAGNOSIS — E785 Hyperlipidemia, unspecified: Secondary | ICD-10-CM

## 2023-09-24 DIAGNOSIS — K219 Gastro-esophageal reflux disease without esophagitis: Secondary | ICD-10-CM

## 2023-09-24 DIAGNOSIS — J441 Chronic obstructive pulmonary disease with (acute) exacerbation: Secondary | ICD-10-CM

## 2023-09-24 DIAGNOSIS — Z0000 Encounter for general adult medical examination without abnormal findings: Secondary | ICD-10-CM | POA: Diagnosis not present

## 2023-09-24 DIAGNOSIS — D126 Benign neoplasm of colon, unspecified: Secondary | ICD-10-CM

## 2023-09-24 DIAGNOSIS — Z23 Encounter for immunization: Secondary | ICD-10-CM | POA: Diagnosis not present

## 2023-09-24 LAB — POCT UA - MICROALBUMIN
Albumin/Creatinine Ratio, Urine, POC: 9.4
Creatinine, POC: 148.4 mg/dL
Microalbumin Ur, POC: 130 mg/L

## 2023-09-24 LAB — POCT GLYCOSYLATED HEMOGLOBIN (HGB A1C): Hemoglobin A1C: 7 % — AB (ref 4.0–5.6)

## 2023-09-24 MED ORDER — METFORMIN HCL 500 MG PO TABS: 500.0000 mg | ORAL_TABLET | Freq: Two times a day (BID) | ORAL | 1 refills | Status: AC

## 2023-09-24 MED ORDER — PANTOPRAZOLE SODIUM 40 MG PO TBEC: 40.0000 mg | DELAYED_RELEASE_TABLET | Freq: Every day | ORAL | 1 refills | Status: DC

## 2023-09-24 MED ORDER — LEVALBUTEROL TARTRATE 45 MCG/ACT IN AERO: INHALATION_SPRAY | RESPIRATORY_TRACT | 1 refills | Status: DC

## 2023-09-24 MED ORDER — LISINOPRIL-HYDROCHLOROTHIAZIDE 20-12.5 MG PO TABS: 1.0000 | ORAL_TABLET | Freq: Every day | ORAL | 3 refills | Status: DC

## 2023-09-24 MED ORDER — FUROSEMIDE 20 MG PO TABS: 20.0000 mg | ORAL_TABLET | Freq: Every day | ORAL | 3 refills | Status: AC

## 2023-09-24 MED ORDER — TRELEGY ELLIPTA 200-62.5-25 MCG/ACT IN AEPB: 1.0000 | INHALATION_SPRAY | Freq: Every day | RESPIRATORY_TRACT | 5 refills | Status: DC

## 2023-09-24 MED ORDER — ROSUVASTATIN CALCIUM 20 MG PO TABS: 20.0000 mg | ORAL_TABLET | Freq: Every day | ORAL | 3 refills | Status: DC

## 2023-09-24 MED ORDER — MONTELUKAST SODIUM 10 MG PO TABS: 10.0000 mg | ORAL_TABLET | Freq: Every day | ORAL | 0 refills | Status: AC

## 2023-09-24 NOTE — Progress Notes (Signed)
Complete physical exam  Patient: [name]   DOB: [birth year]   67 y.o. Female  MRN: [id number]  Subjective:    Chief Complaint  Patient presents with   Annual Exam    Annual exam. Patient will give urine on the way out. Patient has been having pain in her right side buttock/hip/lbp. Will take covid booster.     [name] is a 67 y.o. female who presents today for a complete physical exam. She reports consuming a general diet. The patient does not participate in regular exercise at present. She generally feels well. She reports sleeping fairly well. She does not have additional problems to discuss today.  She states that she did have a colonoscopy done in October of last year.  She does have asthma and continues on Trelegy and also uses Xopenex.  Difficult to get her to commit as to how often she is using the Xopenex but it appears to be once every 3 days or so.  She continues on Singulair for her allergies and for her asthma.  Her reflux seems to be under good control on as needed pantoprazole.  She recently had cataract surgery and is happy with this.  She continues on lisinopril/HCTZ for her blood pressure.  Her weight is essentially unchanged.  She does use ibuprofen on an as-needed basis for various aches and pains.  Continues on metformin for her diabetes.  Her only son died within the last several months and there is still no reason as to exactly why he died.  She does seem to be handling that fairly well.   Most recent fall risk assessment:    06/16/2023    3:11 PM  Fall Risk   Falls in the past year? 0  Number falls in past yr: 0  Injury with Fall? 0  Risk for fall due to : Medication side effect  Follow up Falls prevention discussed;Falls evaluation completed     Most recent depression screenings:    06/16/2023    3:12 PM 09/17/2022    2:49 PM  PHQ 2/9 Scores  PHQ - 2 Score 1 0  PHQ- 9 Score 5     Vision:Within last year    Patient Care  Team: Ronnald Nian, MD as PCP - General (Family Medicine) Bevelyn Ngo as [facility] Care Management [employer], Milas Kocher, [facility] (Pharmacist)  [facility] [address] Nada Libman  Outpatient Medications Prior to Visit  Medication Sig Note   acetaminophen (TYLENOL) 500 MG tablet Take 1,000 mg by mouth every 6 (six) hours as needed for mild pain. 09/24/2023: Took two today   Blood Glucose Monitoring Suppl (ACCU-CHEK AVIVA PLUS) w/Device KIT USE AS DIRECTED TO CHECK BLOOD GLUCOSE 1 TO 2 TIMES DAILY    ibuprofen (ADVIL) 800 MG tablet TAKE 1 TABLET BY MOUTH EVERY 8 HOURS AS NEEDED FOR MILD PAIN    diphenhydramine-acetaminophen (TYLENOL PM) 25-500 MG TABS tablet Take 1 tablet by mouth at bedtime as needed (sleep). (Patient not taking: Reported on 09/24/2023)    [DISCONTINUED] Fluticasone-Umeclidin-Vilant (TRELEGY ELLIPTA) 200-62.5-25 MCG/ACT AEPB INHALE 1 PUFF ONCE DAILY    [DISCONTINUED] furosemide (LASIX) 20 MG tablet Take 1 tablet (20 mg total) by mouth daily. (Patient not taking: Reported on 09/24/2023)    [DISCONTINUED] guaiFENesin-dextromethorphan (ROBITUSSIN DM) 100-10 MG/5ML syrup Take 5 mLs by mouth every 4 (four) hours as needed for cough.    [DISCONTINUED] levalbuterol (XOPENEX HFA) 45 MCG/ACT inhaler INHALE 2 PUFFS BY MOUTH EVERY  4 HOURS AS NEEDED FOR WHEEZING (Patient not taking: Reported on 09/24/2023) 09/24/2023: As needed   [DISCONTINUED] lisinopril-hydrochlorothiazide (ZESTORETIC) 20-12.5 MG tablet Take 1 tablet by mouth daily.    [DISCONTINUED] metFORMIN (GLUCOPHAGE) 500 MG tablet Take 1 tablet (500 mg total) by mouth 2 (two) times daily with a meal.    [DISCONTINUED] montelukast (SINGULAIR) 10 MG tablet TAKE 1 TABLET BY MOUTH AT BEDTIME    [DISCONTINUED] ondansetron (ZOFRAN) 4 MG tablet Take 1 tablet (4 mg total) by mouth every 6 (six) hours.    [DISCONTINUED] ondansetron (ZOFRAN-ODT) 8 MG disintegrating tablet Take 1 tablet (8 mg total) by mouth every 8 (eight) hours as  needed for nausea or vomiting.    [DISCONTINUED] pantoprazole (PROTONIX) 40 MG tablet Take 1 tablet by mouth once daily    No facility-administered medications prior to visit.    Review of Systems  All other systems reviewed and are negative.         Objective:     BP 128/68   Pulse 76   Ht 5\' 4"  (1.626 m)   Wt 236 lb (107 kg)   BMI 40.51 kg/m    Physical Exam     Alert and in no distress. Tympanic membranes and canals are normal. Pharyngeal area is normal. Neck is supple without adenopathy or thyromegaly. Cardiac exam shows a regular sinus rhythm without murmurs or gallops. Lungs are clear to auscultation. Hemoglobin A1c is 7.0 Assessment & Plan:    Routine general medical examination at a health care facility  Adenomatous polyp of colon, unspecified part of colon  Asthma, chronic, mild persistent, uncomplicated - Plan: montelukast (SINGULAIR) 10 MG tablet  History of cataract extraction, unspecified laterality  Other chronic back pain  Other emphysema (HCC)  Stage 3a chronic kidney disease (HCC) - Plan: Comprehensive metabolic panel  Type 2 diabetes mellitus with complications (HCC) - Plan: CBC with Differential/Platelet, Comprehensive metabolic panel, Lipid panel, POCT glycosylated hemoglobin (Hb A1C), POCT UA - Microalbumin, metFORMIN (GLUCOPHAGE) 500 MG tablet  Obesity (BMI 30-39.9)  Hypertension associated with diabetes (HCC) - Plan: CBC with Differential/Platelet, Comprehensive metabolic panel, furosemide (LASIX) 20 MG tablet, lisinopril-hydrochlorothiazide (ZESTORETIC) 20-12.5 MG tablet  History of colonic polyps  Gastroesophageal reflux disease without esophagitis - Plan: pantoprazole (PROTONIX) 40 MG tablet  Need for COVID-19 vaccine - Plan: Pfizer Comirnaty Covid -19 Vaccine 7yrs and older  Need for influenza vaccination  Hyperlipidemia associated with type 2 diabetes mellitus (HCC) - Plan: Lipid panel  COPD with acute exacerbation (HCC) -  Plan: Fluticasone-Umeclidin-Vilant (TRELEGY ELLIPTA) 200-62.5-25 MCG/ACT AEPB, levalbuterol (XOPENEX HFA) 45 MCG/ACT inhaler  Chronic seasonal allergic rhinitis due to pollen  Immunization History  Administered Date(s) Administered   Hepatitis B 08/13/2012, 09/10/2012   Hepatitis B, ADULT 09/28/2013   Moderna Covid-19 Vaccine Bivalent Booster 81yrs & up 11/07/2021   Moderna SARS-COV2 Booster Vaccination 11/08/2020   Moderna Sars-Covid-2 Vaccination 03/08/2020, 04/03/2020   PPD Test 08/08/2014, 09/10/2015, 09/01/2016   Pfizer(Comirnaty)Fall Seasonal Vaccine 12 years and older 11/06/2022   Pneumococcal Conjugate-13 03/06/2017   Pneumococcal Polysaccharide-23 08/13/2012   Tdap 08/13/2012   Zoster Recombinant(Shingrix) 01/12/2017, 07/18/2017   Zoster, Live 10/10/2014    Health Maintenance  Topic Date Due   Pneumonia Vaccine 30+ Years old (3 of 3 - PPSV23 or PCV20) 06/12/2019   Colonoscopy  07/01/2021   DTaP/Tdap/Td (2 - Td or Tdap) 08/13/2022   HEMOGLOBIN A1C  03/19/2023   INFLUENZA VACCINE  Never done   COVID-19 Vaccine (6 - 2023-24 season) 08/02/2023  OPHTHALMOLOGY EXAM  08/06/2023   Diabetic kidney evaluation - Urine ACR  09/18/2023   Diabetic kidney evaluation - eGFR measurement  12/10/2023   Medicare Annual Wellness (AWV)  06/15/2024   FOOT EXAM  09/23/2024   MAMMOGRAM  12/19/2024   DEXA SCAN  Completed   Hepatitis C Screening  Completed   Zoster Vaccines- Shingrix  Completed   HPV VACCINES  Aged Out     Problem List Items Addressed This Visit     Adenomatous polyp of colon   Asthma, chronic, mild persistent, uncomplicated   Relevant Medications   Fluticasone-Umeclidin-Vilant (TRELEGY ELLIPTA) 200-62.5-25 MCG/ACT AEPB   levalbuterol (XOPENEX HFA) 45 MCG/ACT inhaler   montelukast (SINGULAIR) 10 MG tablet   Chronic back pain   Chronic seasonal allergic rhinitis due to pollen   COPD (chronic obstructive pulmonary disease) (HCC)   Relevant Medications    Fluticasone-Umeclidin-Vilant (TRELEGY ELLIPTA) 200-62.5-25 MCG/ACT AEPB   levalbuterol (XOPENEX HFA) 45 MCG/ACT inhaler   montelukast (SINGULAIR) 10 MG tablet   Gastroesophageal reflux disease without esophagitis   Relevant Medications   pantoprazole (PROTONIX) 40 MG tablet   History of cataract surgery   RESOLVED: History of colonic polyps   Hypertension associated with diabetes (HCC)   Relevant Medications   furosemide (LASIX) 20 MG tablet   metFORMIN (GLUCOPHAGE) 500 MG tablet   lisinopril-hydrochlorothiazide (ZESTORETIC) 20-12.5 MG tablet   rosuvastatin (CRESTOR) 20 MG tablet   Other Relevant Orders   CBC with Differential/Platelet   Comprehensive metabolic panel   Obesity (BMI 40-34.9)   Relevant Medications   metFORMIN (GLUCOPHAGE) 500 MG tablet   Stage 3a chronic kidney disease (HCC)   Relevant Orders   Comprehensive metabolic panel   Type 2 diabetes mellitus with complications (HCC)   Relevant Medications   metFORMIN (GLUCOPHAGE) 500 MG tablet   lisinopril-hydrochlorothiazide (ZESTORETIC) 20-12.5 MG tablet   rosuvastatin (CRESTOR) 20 MG tablet   Other Relevant Orders   CBC with Differential/Platelet   Comprehensive metabolic panel   Lipid panel   POCT glycosylated hemoglobin (Hb A1C)   POCT UA - Microalbumin   Other Visit Diagnoses     Routine general medical examination at a health care facility    -  Primary   Need for COVID-19 vaccine       Relevant Orders   Pfizer Comirnaty Covid -19 Vaccine 65yrs and older   Need for influenza vaccination       Hyperlipidemia associated with type 2 diabetes mellitus (HCC)       Relevant Medications   furosemide (LASIX) 20 MG tablet   metFORMIN (GLUCOPHAGE) 500 MG tablet   lisinopril-hydrochlorothiazide (ZESTORETIC) 20-12.5 MG tablet   rosuvastatin (CRESTOR) 20 MG tablet   Other Relevant Orders   Lipid panel   COPD with acute exacerbation (HCC)       Relevant Medications   Fluticasone-Umeclidin-Vilant (TRELEGY ELLIPTA)  200-62.5-25 MCG/ACT AEPB   levalbuterol (XOPENEX HFA) 45 MCG/ACT inhaler   montelukast (SINGULAIR) 10 MG tablet     Offered condolences for the death of her son.  She will continue her present medication regimen and to be as physically active as she can.  Her immunizations were all updated.  Overall she seems to doing fairly well taking care of herself although weight is always going to be an issue with her. Return in about 6 months (around 03/24/2024).     Sharlot Gowda, MD

## 2023-09-25 LAB — CBC WITH DIFFERENTIAL/PLATELET
Basophils Absolute: 0.1 10*3/uL (ref 0.0–0.2)
Basos: 1 %
EOS (ABSOLUTE): 1.1 10*3/uL — ABNORMAL HIGH (ref 0.0–0.4)
Eos: 10 %
Hematocrit: 36.6 % (ref 34.0–46.6)
Hemoglobin: 11.9 g/dL (ref 11.1–15.9)
Immature Grans (Abs): 0 10*3/uL (ref 0.0–0.1)
Immature Granulocytes: 0 %
Lymphocytes Absolute: 2.8 10*3/uL (ref 0.7–3.1)
Lymphs: 26 %
MCH: 30.5 pg (ref 26.6–33.0)
MCHC: 32.5 g/dL (ref 31.5–35.7)
MCV: 94 fL (ref 79–97)
Monocytes Absolute: 1 10*3/uL — ABNORMAL HIGH (ref 0.1–0.9)
Monocytes: 9 %
Neutrophils Absolute: 6.1 10*3/uL (ref 1.4–7.0)
Neutrophils: 54 %
Platelets: 391 10*3/uL (ref 150–450)
RBC: 3.9 x10E6/uL (ref 3.77–5.28)
RDW: 12.6 % (ref 11.7–15.4)
WBC: 11.1 10*3/uL — ABNORMAL HIGH (ref 3.4–10.8)

## 2023-09-25 LAB — COMPREHENSIVE METABOLIC PANEL
ALT: 16 [IU]/L (ref 0–32)
AST: 20 [IU]/L (ref 0–40)
Albumin: 4.3 g/dL (ref 3.9–4.9)
Alkaline Phosphatase: 72 [IU]/L (ref 44–121)
BUN/Creatinine Ratio: 19 (ref 12–28)
BUN: 27 mg/dL (ref 8–27)
Bilirubin Total: 0.2 mg/dL (ref 0.0–1.2)
CO2: 21 mmol/L (ref 20–29)
Calcium: 10.2 mg/dL (ref 8.7–10.3)
Chloride: 100 mmol/L (ref 96–106)
Creatinine, Ser: 1.41 mg/dL — ABNORMAL HIGH (ref 0.57–1.00)
Globulin, Total: 3.2 g/dL (ref 1.5–4.5)
Glucose: 106 mg/dL — ABNORMAL HIGH (ref 70–99)
Potassium: 4.7 mmol/L (ref 3.5–5.2)
Sodium: 139 mmol/L (ref 134–144)
Total Protein: 7.5 g/dL (ref 6.0–8.5)
eGFR: 40 mL/min/{1.73_m2} — ABNORMAL LOW (ref >59)

## 2023-09-25 LAB — LIPID PANEL
Chol/HDL Ratio: 3.4 ratio (ref 0.0–4.4)
Cholesterol, Total: 185 mg/dL (ref 100–199)
HDL: 54 mg/dL (ref >39)
LDL Chol Calc (NIH): 115 mg/dL — ABNORMAL HIGH (ref 0–99)
Triglycerides: 88 mg/dL (ref 0–149)
VLDL Cholesterol Cal: 16 mg/dL (ref 5–40)

## 2023-09-25 MED ORDER — ROSUVASTATIN CALCIUM 40 MG PO TABS: 40.0000 mg | ORAL_TABLET | Freq: Every day | ORAL | 3 refills | Status: DC

## 2023-09-25 NOTE — Addendum Note (Signed)
Addended by: Ronnald Nian on: 09/25/2023 02:25 PM   Modules accepted: Orders

## 2023-10-08 ENCOUNTER — Encounter: Payer: Self-pay | Admitting: Medical

## 2023-10-08 ENCOUNTER — Telehealth: Payer: 59 | Admitting: Medical

## 2023-10-08 ENCOUNTER — Other Ambulatory Visit: Payer: Medicare Other

## 2023-10-08 VITALS — BP 120/66 | Wt 229.0 lb

## 2023-10-08 DIAGNOSIS — R058 Other specified cough: Secondary | ICD-10-CM | POA: Diagnosis not present

## 2023-10-08 DIAGNOSIS — J988 Other specified respiratory disorders: Secondary | ICD-10-CM

## 2023-10-08 MED ORDER — CEFUROXIME AXETIL 500 MG PO TABS: 500.0000 mg | ORAL_TABLET | Freq: Two times a day (BID) | ORAL | 0 refills | Status: AC

## 2023-10-08 NOTE — Progress Notes (Signed)
Subjective:     Patient ID: [name], female   DOB: February 05, 1956, 67 y.o.   MRN: [id number]  This visit type was conducted due to national recommendations for restrictions regarding the COVID-19 Pandemic (e.g. social distancing) in an effort to limit this patient's exposure and mitigate transmission in our community.  Due to their co-morbid illnesses, this patient is at least at moderate risk for complications without adequate follow up.  This format is felt to be most appropriate for this patient at this time.    Documentation for virtual audio and video telecommunications through [facility] encounter:  The patient was located at home. The provider was located in the office. The patient did consent to this visit and is aware of possible charges through their insurance for this visit.  The other persons participating in this telemedicine service were none. Time spent on call was 20 minutes and in review of previous records 20 minutes total.  This virtual service is not related to other E/M service within previous 7 days.   HPI Chief Complaint  Patient presents with   other    Cough since last Friday, cold symptoms, no fever, no ST, no stomach issues, phlegm is white and yellowish,    Virtual for illness.  She notes having a cold over the weekend.  Got her covid shot on 09/24/23.   The next day started getting sick.   So over the past week had cold for about a week, seemed to get better, but now worse now, never really cleared up.   Current symptoms cough, head and chest congestion.  No sore throat. No fever.  No chills, but has had some aches in hips.  No NVD.  Has a  little SOB.  Using her inhalers as usual, having to use nebulizer or handheld xopenex a little more than usual this past week.  Using mucinex and coricidin PM at night.  No other aggravating or relieving factors. No other complaint.   Past Medical History:  Diagnosis Date   Arthritis    Asthma    Colonic polyp     GERD (gastroesophageal reflux disease)    Hypertension    Obesity    Pain in limb    Smoker    Swelling of limb    Current Outpatient Medications on File Prior to Visit  Medication Sig Dispense Refill   acetaminophen (TYLENOL) 500 MG tablet Take 1,000 mg by mouth every 6 (six) hours as needed for mild pain.     Blood Glucose Monitoring Suppl (ACCU-CHEK AVIVA PLUS) w/Device KIT USE AS DIRECTED TO CHECK BLOOD GLUCOSE 1 TO 2 TIMES DAILY 1 kit 0   diphenhydramine-acetaminophen (TYLENOL PM) 25-500 MG TABS tablet Take 1 tablet by mouth at bedtime as needed (sleep).     Fluticasone-Umeclidin-Vilant (TRELEGY ELLIPTA) 200-62.5-25 MCG/ACT AEPB Take 1 Inhalation by mouth daily. 60 each 5   furosemide (LASIX) 20 MG tablet Take 1 tablet (20 mg total) by mouth daily. 30 tablet 3   ibuprofen (ADVIL) 800 MG tablet TAKE 1 TABLET BY MOUTH EVERY 8 HOURS AS NEEDED FOR MILD PAIN 90 tablet 0   levalbuterol (XOPENEX HFA) 45 MCG/ACT inhaler INHALE 2 PUFFS BY MOUTH EVERY 4 HOURS AS NEEDED FOR WHEEZING 15 g 1   lisinopril-hydrochlorothiazide (ZESTORETIC) 20-12.5 MG tablet Take 1 tablet by mouth daily. 90 tablet 3   metFORMIN (GLUCOPHAGE) 500 MG tablet Take 1 tablet (500 mg total) by mouth 2 (two) times daily with a meal. 180 tablet 1  montelukast (SINGULAIR) 10 MG tablet Take 1 tablet (10 mg total) by mouth at bedtime. 30 tablet 0   pantoprazole (PROTONIX) 40 MG tablet Take 1 tablet (40 mg total) by mouth daily. 90 tablet 1   rosuvastatin (CRESTOR) 40 MG tablet Take 1 tablet (40 mg total) by mouth daily. 90 tablet 3   No current facility-administered medications on file prior to visit.     Review of Systems As in subjective    Objective:   Physical Exam Due to coronavirus pandemic stay at home measures, patient visit was virtual and they were not examined in person.   BP 120/66   Wt 229 lb (103.9 kg)   BMI 39.31 kg/m   Gen: wd, wn, nad No labored breathing or wheezing      Assessment:      Encounter Diagnoses  Name Primary?   Cough productive of purulent sputum Yes   Respiratory tract infection        Plan:     Advised continued rest, she can continue Mucinex in the daytime and Coricidin PM at night that she has been doing, continue with good water intake, begin antibiotic below.  As long as things improve over the next few days then great, but if worse over the next few days particular with problems breathing or overall weakness then get reevaluated.   [name] was seen today for other.  Diagnoses and all orders for this visit:  Cough productive of purulent sputum  Respiratory tract infection  Other orders -     cefUROXime (CEFTIN) 500 MG tablet; Take 1 tablet (500 mg total) by mouth 2 (two) times daily with a meal for 10 days.    F/u prn

## 2023-10-25 ENCOUNTER — Other Ambulatory Visit: Payer: Self-pay | Admitting: Family Medicine

## 2023-10-25 DIAGNOSIS — F17200 Nicotine dependence, unspecified, uncomplicated: Secondary | ICD-10-CM

## 2023-10-26 NOTE — Telephone Encounter (Signed)
Last apt 09/24/23.

## 2023-11-10 ENCOUNTER — Ambulatory Visit (INDEPENDENT_AMBULATORY_CARE_PROVIDER_SITE_OTHER): Payer: 59 | Admitting: Family Medicine

## 2023-11-10 ENCOUNTER — Encounter: Payer: Self-pay | Admitting: Family Medicine

## 2023-11-10 VITALS — BP 134/70 | HR 74 | Ht 64.0 in | Wt 237.0 lb

## 2023-11-10 DIAGNOSIS — M545 Low back pain, unspecified: Secondary | ICD-10-CM | POA: Diagnosis not present

## 2023-11-10 NOTE — Progress Notes (Signed)
   Subjective:    Patient ID: [name], female    DOB: 03-07-1956, 67 y.o.   MRN: [id number]  HPI She came initially for evaluation of a spot on her gluteal area but subsequently after removing the scab that there is nothing to be seen.  She then discussed the fact she has chronic back pain and is using ibuprofen and topical medications with good results for the left side of the back but over the last month she has noted some right gluteal discomfort with radiation down to the posterior thigh.  She is using a cane to get around as she sometimes feels as if she will fall. Review of Systems     Objective:    Physical Exam Exam of the buttock area shows a healing lesion.  Right gluteal area shows no palpable tenderness over the spine or the SI joint.  Normal hip motion.  Negative straight leg raising.  Difficulty assessing DTRs but they.  Fairly normal.       Assessment & Plan:  Acute right-sided low back pain, unspecified whether sciatica present - Plan: DG Lumbar Spine Complete

## 2023-11-18 ENCOUNTER — Ambulatory Visit
Admission: RE | Admit: 2023-11-18 | Discharge: 2023-11-18 | Disposition: A | Discharge status: Home / Self Care | Payer: 59 | Source: Ambulatory Visit | Attending: Family Medicine | Admitting: Family Medicine

## 2023-11-18 DIAGNOSIS — M51362 Other intervertebral disc degeneration, lumbar region with discogenic back pain and lower extremity pain: Secondary | ICD-10-CM | POA: Diagnosis not present

## 2023-11-27 ENCOUNTER — Encounter: Payer: 59 | Admitting: Internal Medicine

## 2023-11-29 NOTE — Addendum Note (Signed)
Addended by: Ronnald Nian on: 11/29/2023 02:10 PM   Modules accepted: Orders

## 2023-12-04 ENCOUNTER — Ambulatory Visit (INDEPENDENT_AMBULATORY_CARE_PROVIDER_SITE_OTHER): Payer: 59 | Admitting: Physical Medicine and Rehabilitation

## 2023-12-04 ENCOUNTER — Encounter: Payer: Self-pay | Admitting: Physical Medicine and Rehabilitation

## 2023-12-04 VITALS — BP 150/80 | HR 69

## 2023-12-04 DIAGNOSIS — M5441 Lumbago with sciatica, right side: Secondary | ICD-10-CM | POA: Diagnosis not present

## 2023-12-04 DIAGNOSIS — M48061 Spinal stenosis, lumbar region without neurogenic claudication: Secondary | ICD-10-CM

## 2023-12-04 DIAGNOSIS — M5416 Radiculopathy, lumbar region: Secondary | ICD-10-CM | POA: Diagnosis not present

## 2023-12-04 DIAGNOSIS — G8929 Other chronic pain: Secondary | ICD-10-CM | POA: Diagnosis not present

## 2023-12-04 MED ORDER — METHOCARBAMOL 500 MG PO TABS
500.0000 mg | ORAL_TABLET | Freq: Three times a day (TID) | ORAL | 0 refills | Status: DC
Start: 2023-12-04 — End: 2024-07-21

## 2023-12-04 MED ORDER — ACETAMINOPHEN-CODEINE 300-30 MG PO TABS: 1.0000 | ORAL_TABLET | Freq: Three times a day (TID) | ORAL | 0 refills | Status: DC | PRN

## 2023-12-04 NOTE — Progress Notes (Signed)
 Functional Pain Scale - descriptive words and definitions  Immobilizing (10)   Unable to move or talk due to intensity of pain/unable to sleep and unable to use distraction. Severe range order  Average Pain 10  Acute Right side LBP, right sided buttocks pain that goes into leg. She has muscle spasms and pain that make it harm for her to walk. She has increased OTC pain medication. It hurts when she is standing and sitting.

## 2023-12-04 NOTE — Progress Notes (Signed)
 [name] - 68 y.o. female MRN [id number]  Date of birth: 1956/06/13  Office Visit Note: Visit Date: 12/04/2023 PCP: Lisa Norleen BROCKS, MD Referred by: Lisa Norleen BROCKS, MD  Subjective: Chief Complaint  Patient presents with   Lower Back - Pain   Right Leg - Pain   HPI: [name] is a 68 y.o. female who comes in today per the request of Dr. Norleen Lisa for evaluation of chronic, worsening and severe right sided lower back pain radiating to posterolateral thigh down to knee. Pain ongoing for several months, worsens with standing and walking. She describes pain as sore and aching, feels like she is being punched in her buttock. Currently rates as 10 out of 10. Some relief of pain with home exercise regimen, rest and use of medications. History of formal physical therapy several years ago with some relief of pain. Lumbar MRI imaging from 2022 shows facet and ligamentous hypertrophy more pronounced on the right than the left at L4-L5. There is bilateral foraminal stenosis at L5-S1, either L5 nerve root could be compressed. There is also facet arthritis at this level that could contribute to lower back pain. No high grade spinal canal stenosis noted. No history of lumbar surgery/injections. She is managed by Dr. Ozell Cummins from orthopedic standpoint. Patient currently using cane to assist with ambulation. Patient denies focal weakness, numbness and tingling. No recent trauma or falls.      Review of Systems  Musculoskeletal:  Positive for back pain.  Neurological:  Negative for tingling, sensory change, focal weakness and weakness.  All other systems reviewed and are negative.  Otherwise per HPI.  Assessment & Plan: Visit Diagnoses:    ICD-10-CM   1. Chronic right-sided low back pain with right-sided sciatica  M54.41    G89.29     2. Radiculopathy, lumbar region  M54.16     3. Foraminal stenosis of lumbar region  M48.061        Plan: Findings:  Chronic,  worsening and severe right sided lower back pain radiating to posterolateral thigh down to knee. Patient continues to have severe pain despite good conservative therapies such as formal physical therapy, home exercise regimen, rest and use of medications. Patients clinical presentation and exam are consistent with L5 nerve pattern. We discussed treatment plan in detail today, would recommend lumbar epidural steroid injection, however patient would like to try medication therapy first. I prescribed Tylenol  #3 and Robaxin  for her to try. Should her pain persist would consider either interlaminar injection on the right at L5-S1 or right L5 transforaminal approach. Could also look at re-grouping with formal physical therapy. Patient requesting note to excuse her from jury duty, she can follow up with us  as needed. No red flag symptoms noted upon exam today.     Meds & Orders:  Meds ordered this encounter  Medications   methocarbamol  (ROBAXIN ) 500 MG tablet    Sig: Take 1 tablet (500 mg total) by mouth 3 (three) times daily.    Dispense:  90 tablet    Refill:  0   acetaminophen -codeine  (TYLENOL  #3) 300-30 MG tablet    Sig: Take 1 tablet by mouth every 8 (eight) hours as needed for moderate pain (pain score 4-6) or severe pain (pain score 7-10).    Dispense:  15 tablet    Refill:  0   No orders of the defined types were placed in this encounter.   Follow-up: Return if symptoms worsen or fail  to improve.   Procedures: No procedures performed      Clinical History: CLINICAL DATA:  Low back pain with bilateral hip and bilateral leg pain.   EXAM: MRI LUMBAR SPINE WITHOUT CONTRAST   TECHNIQUE: Multiplanar, multisequence MR imaging of the lumbar spine was performed. No intravenous contrast was administered.   COMPARISON:  Radiography 12/25/2020.   FINDINGS: Segmentation:  5 lumbar type vertebral bodies.   Alignment:  Normal   Vertebrae: No fracture or primary bone lesion. Chronic  discogenic endplate changes at L5-S1 without active edema.   Conus medullaris and cauda equina: Conus extends to the L1-2 level. Conus and cauda equina appear normal.   Paraspinal and other soft tissues: Negative   Disc levels:   T12-L1 and L1-2 are normal.   L2-3 and L3-4 show minimal non-compressive bulging of the discs.   L4-5: Moderate bulging of the disc. Facet and ligamentous hypertrophy more pronounced on the right than the left. Small synovial cyst projecting inward from the right. Stenosis of the lateral recesses and foramina right more than left. Neural compression could occur at this level, particularly on the right. Facet arthritis could contribute to back pain or referred facet syndrome.   L5-S1: Chronic disc degeneration with loss of disc height. Endplate osteophytes and bulging of the disc. Facet and ligamentous hypertrophy. No central canal stenosis. Bilateral foraminal stenosis right more than left. Either L5 nerve could be compressed, more likely the right. The facet arthritis could contribute to back pain or referred facet syndrome.   IMPRESSION: 1. L4-5: Moderate bulging of the disc. Facet and ligamentous hypertrophy more pronounced on the right than the left. Small synovial cyst projecting inward from the right. Stenosis of the lateral recesses and foramina right more than left. Neural compression could occur at this level, particularly on the right. 2. L5-S1: Chronic disc degeneration with loss of disc height. Endplate osteophytes and bulging of the disc. Facet and ligamentous hypertrophy. Bilateral foraminal stenosis right more than left. Either L5 nerve could be compressed, more likely the right. 3. 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     Electronically Signed   By: Oneil Officer M.D.   On: 01/21/2021 02:55   She reports that she has quit smoking. Her smoking  use included cigarettes. She has a 2.5 pack-year smoking history. She has never used smokeless tobacco.  Recent Labs    09/24/23 1620  HGBA1C 7.0*    Objective:  VS:  HT:    WT:   BMI:     BP:(!) 150/80  HR:69bpm  TEMP: ( )  RESP:  Physical Exam Vitals and nursing note reviewed.  HENT:     Head: Normocephalic and atraumatic.     Right Ear: External ear normal.     Left Ear: External ear normal.     Nose: Nose normal.     Mouth/Throat:     Mouth: Mucous membranes are moist.  Eyes:     Extraocular Movements: Extraocular movements intact.  Cardiovascular:     Rate and Rhythm: Normal rate.     Pulses: Normal pulses.  Pulmonary:     Effort: Pulmonary effort is normal.  Abdominal:     General: Abdomen is flat. There is no distension.  Musculoskeletal:        General: Tenderness present.     Cervical back: Normal range of motion.     Comments: Patient is  slow to rise from seated position to standing. Good lumbar range of motion. No pain noted with facet loading. 5/5 strength noted with bilateral hip flexion, knee flexion/extension, ankle dorsiflexion/plantarflexion and EHL. No clonus noted bilaterally. No pain upon palpation of greater trochanters. No pain with internal/external rotation of bilateral hips. Sensation intact bilaterally. Dysesthesias noted to right L5 dermatome. Negative slump test bilaterally. Ambulates with cane, antalgia gait.  Skin:    General: Skin is warm and dry.     Capillary Refill: Capillary refill takes less than 2 seconds.  Neurological:     Mental Status: She is alert and oriented to person, place, and time.     Gait: Gait abnormal.  Psychiatric:        Mood and Affect: Mood normal.        Behavior: Behavior normal.     Ortho Exam  Imaging: No results found.  Past Medical/Family/Surgical/Social History: Medications & Allergies reviewed per EMR, new medications updated. Patient Active Problem List   Diagnosis Date Noted   Myopathy,  unspecified 07/15/2023   COPD (chronic obstructive pulmonary disease) (HCC) 11/30/2022   Stage 3a chronic kidney disease (HCC) 01/23/2021   History of cataract surgery 04/27/2020   Multiple somatic complaints 01/10/2019   Adenomatous polyp of colon 05/25/2018   Asthma, chronic, mild persistent, uncomplicated 03/06/2017   Chronic seasonal allergic rhinitis due to pollen 03/06/2017   Gastroesophageal reflux disease without esophagitis 10/10/2014   Ex-cigarette smoker 09/28/2013   Type 2 diabetes mellitus with complications (HCC) 07/12/2012   Obesity (BMI 30-39.9) 07/12/2012   Chronic back pain 07/12/2012   Hypertension associated with diabetes (HCC) 07/12/2012   Past Medical History:  Diagnosis Date   Arthritis    Asthma    Colonic polyp    GERD (gastroesophageal reflux disease)    Hypertension    Obesity    Pain in limb    Smoker    Swelling of limb    Family History  Problem Relation Age of Onset   Lung cancer Father    Cirrhosis Mother    Cirrhosis Brother    Colon cancer Neg Hx    Colon polyps Neg Hx    Esophageal cancer Neg Hx    Rectal cancer Neg Hx    Stomach cancer Neg Hx    Past Surgical History:  Procedure Laterality Date   BIOPSY BREAST Left 09/22/2018   no maglignancy   CATARACT EXTRACTION Right 05/25/2023   COLONOSCOPY  2009   Dr. Abran   POLYPECTOMY     Social History   Occupational History   Not on file  Tobacco Use   Smoking status: Former    Current packs/day: 0.25    Average packs/day: 0.3 packs/day for 10.0 years (2.5 ttl pk-yrs)    Types: Cigarettes   Smokeless tobacco: Never   Tobacco comments:    quit oct 2018  Vaping Use   Vaping status: Never Used  Substance and Sexual Activity   Alcohol use: Not Currently    Comment: socially   Drug use: Not Currently    Types: Marijuana    Comment: social   Sexual activity: Not Currently

## 2023-12-11 ENCOUNTER — Telehealth: Payer: Self-pay | Admitting: Family Medicine

## 2023-12-11 NOTE — Telephone Encounter (Signed)
 Pt called and states the water pills she is taking is making her have cramps and she is asking if you can prescribe some potassium to take. She states she doesn't eat bananas. She uses  [id number] - [place] ([place]), [place] - [address]

## 2023-12-15 ENCOUNTER — Other Ambulatory Visit: Payer: Self-pay | Admitting: Physical Medicine and Rehabilitation

## 2023-12-18 ENCOUNTER — Telehealth: Payer: Self-pay

## 2023-12-18 NOTE — Telephone Encounter (Signed)
Patient called and would like to discuss back injection with you again. She is having a lot of pain and the medication is not helping. Is there something stronger that she can take until she has injection? Call her at [phone]

## 2023-12-21 ENCOUNTER — Other Ambulatory Visit: Payer: Self-pay | Admitting: Physical Medicine and Rehabilitation

## 2023-12-21 MED ORDER — TRAMADOL HCL 50 MG PO TABS: 50.0000 mg | ORAL_TABLET | Freq: Three times a day (TID) | ORAL | 0 refills | Status: DC | PRN

## 2023-12-22 ENCOUNTER — Ambulatory Visit: Payer: 59 | Admitting: Family Medicine

## 2023-12-23 ENCOUNTER — Other Ambulatory Visit: Payer: Self-pay | Admitting: Physical Medicine and Rehabilitation

## 2023-12-23 DIAGNOSIS — M5416 Radiculopathy, lumbar region: Secondary | ICD-10-CM

## 2023-12-23 DIAGNOSIS — G8929 Other chronic pain: Secondary | ICD-10-CM

## 2023-12-23 NOTE — Telephone Encounter (Signed)
Patient would like to go through with injection.

## 2023-12-24 ENCOUNTER — Ambulatory Visit: Payer: 59 | Admitting: Family Medicine

## 2023-12-24 ENCOUNTER — Encounter: Payer: Self-pay | Admitting: Family Medicine

## 2023-12-24 ENCOUNTER — Ambulatory Visit (INDEPENDENT_AMBULATORY_CARE_PROVIDER_SITE_OTHER): Payer: 59 | Admitting: Family Medicine

## 2023-12-24 VITALS — BP 130/76 | HR 78 | Temp 98.2°F | Wt 235.2 lb

## 2023-12-24 DIAGNOSIS — G8929 Other chronic pain: Secondary | ICD-10-CM | POA: Diagnosis not present

## 2023-12-24 DIAGNOSIS — E878 Other disorders of electrolyte and fluid balance, not elsewhere classified: Secondary | ICD-10-CM | POA: Diagnosis not present

## 2023-12-24 DIAGNOSIS — M5441 Lumbago with sciatica, right side: Secondary | ICD-10-CM

## 2023-12-24 NOTE — Progress Notes (Signed)
   Subjective:    Patient ID: [name], female    DOB: 1956/03/03, 68 y.o.   MRN: [id number]  HPI She is here for concerns of low potassium.  She is on a diuretic and was told that she might need potassium supplement.  She also has been having chronic right-sided low back pain and epidural injections have apparently been recommended and she is curious as to my thoughts on this.  Presently she is taking tramadol for the pain and also apparently using a cane at home.   Review of Systems     Objective:    Physical Exam Alert and in no distress.  Exam of her medical record shows that she has had normal potassium levels.       Assessment & Plan:  Chronic right-sided low back pain with right-sided sciatica  Imbalance in body salts like potassium and sodium I explained that since she shows no evidence of decreased potassium, supplementing that really would not be of any benefit.  She then mentioned magnesium and I certainly said that is okay for her to be taking it. Then discussed the epidural and strongly encouraged her to get the epidural injection and try it more than once before giving up on that

## 2023-12-25 ENCOUNTER — Other Ambulatory Visit: Payer: Self-pay | Admitting: Family Medicine

## 2023-12-25 DIAGNOSIS — F17200 Nicotine dependence, unspecified, uncomplicated: Secondary | ICD-10-CM

## 2023-12-30 ENCOUNTER — Ambulatory Visit: Payer: 59 | Admitting: Family Medicine

## 2024-01-07 ENCOUNTER — Ambulatory Visit: Payer: 59 | Admitting: Physical Medicine and Rehabilitation

## 2024-01-07 ENCOUNTER — Other Ambulatory Visit: Payer: Self-pay

## 2024-01-07 VITALS — BP 153/83 | HR 74

## 2024-01-07 DIAGNOSIS — M5416 Radiculopathy, lumbar region: Secondary | ICD-10-CM | POA: Diagnosis not present

## 2024-01-07 MED ORDER — METHYLPREDNISOLONE ACETATE 40 MG/ML IJ SUSP
40.0000 mg | Freq: Once | INTRAMUSCULAR | Status: AC
  Administered 2024-01-07: 40 mg

## 2024-01-07 NOTE — Progress Notes (Signed)
Pain scale--10  No allergies to Contrast No blood thinners

## 2024-01-15 NOTE — Progress Notes (Signed)
 [name] - 68 y.o. female MRN [id number]  Date of birth: May 03, 1956  Office Visit Note: Visit Date: 01/07/2024 PCP: Joyce Norleen BROCKS, MD Referred by: Joyce Norleen BROCKS, MD  Subjective: Chief Complaint  Patient presents with   Lower Back - Pain   HPI:  [name] is a 68 y.o. female who comes in today at the request of Duwaine Pouch, FNP for planned Right L5-S1 Lumbar Interlaminar epidural steroid injection with fluoroscopic guidance.  The patient has failed conservative care including home exercise, medications, time and activity modification.  This injection will be diagnostic and hopefully therapeutic.  Please see requesting physician notes for further details and justification.   ROS Otherwise per HPI.  Assessment & Plan: Visit Diagnoses:    ICD-10-CM   1. Radiculopathy, lumbar region  M54.16 XR C-ARM NO REPORT    Epidural Steroid injection    methylPREDNISolone  acetate (DEPO-MEDROL ) injection 40 mg      Plan: No additional findings.   Meds & Orders:  Meds ordered this encounter  Medications   methylPREDNISolone  acetate (DEPO-MEDROL ) injection 40 mg    Orders Placed This Encounter  Procedures   XR C-ARM NO REPORT   Epidural Steroid injection    Follow-up: Return if symptoms worsen or fail to improve.   Procedures: No procedures performed  Lumbar Epidural Steroid Injection - Interlaminar Approach with Fluoroscopic Guidance  Patient: [name]      Date of Birth: 1956/11/25 MRN: [id number] PCP: Joyce Norleen BROCKS, MD      Visit Date: 01/07/2024   Universal Protocol:     Consent Given By: the patient  Position: PRONE  Additional Comments: Vital signs were monitored before and after the procedure. Patient was prepped and draped in the usual sterile fashion. The correct patient, procedure, and site was verified.   Injection Procedure Details:   Procedure diagnoses: Radiculopathy, lumbar region [M54.16]   Meds Administered:   Meds ordered this encounter  Medications   methylPREDNISolone  acetate (DEPO-MEDROL ) injection 40 mg     Laterality: Right  Location/Site:  L5-S1  Needle: 3.5 in., 20 ga. Tuohy  Needle Placement: Paramedian epidural  Findings:   -Comments: Excellent flow of contrast into the epidural space.  Procedure Details: Using a paramedian approach from the side mentioned above, the region overlying the inferior lamina was localized under fluoroscopic visualization and the soft tissues overlying this structure were infiltrated with 4 ml. of 1% Lidocaine without Epinephrine . The Tuohy needle was inserted into the epidural space using a paramedian approach.   The epidural space was localized using loss of resistance along with counter oblique bi-planar fluoroscopic views.  After negative aspirate for air, blood, and CSF, a 2 ml. volume of Isovue-250 was injected into the epidural space and the flow of contrast was observed. Radiographs were obtained for documentation purposes.    The injectate was administered into the level noted above.   Additional Comments:  No complications occurred Dressing: 2 x 2 sterile gauze and Band-Aid    Post-procedure details: Patient was observed during the procedure. Post-procedure instructions were reviewed.  Patient left the clinic in stable condition.   Clinical History: CLINICAL DATA:  Low back pain with bilateral hip and bilateral leg pain.   EXAM: MRI LUMBAR SPINE WITHOUT CONTRAST   TECHNIQUE: Multiplanar, multisequence MR imaging of the lumbar spine was performed. No intravenous contrast was administered.   COMPARISON:  Radiography 12/25/2020.   FINDINGS: Segmentation:  5 lumbar type vertebral bodies.  Alignment:  Normal   Vertebrae: No fracture or primary bone lesion. Chronic discogenic endplate changes at L5-S1 without active edema.   Conus medullaris and cauda equina: Conus extends to the L1-2 level. Conus and cauda equina appear  normal.   Paraspinal and other soft tissues: Negative   Disc levels:   T12-L1 and L1-2 are normal.   L2-3 and L3-4 show minimal non-compressive bulging of the discs.   L4-5: Moderate bulging of the disc. Facet and ligamentous hypertrophy more pronounced on the right than the left. Small synovial cyst projecting inward from the right. Stenosis of the lateral recesses and foramina right more than left. Neural compression could occur at this level, particularly on the right. Facet arthritis could contribute to back pain or referred facet syndrome.   L5-S1: Chronic disc degeneration with loss of disc height. Endplate osteophytes and bulging of the disc. Facet and ligamentous hypertrophy. No central canal stenosis. Bilateral foraminal stenosis right more than left. Either L5 nerve could be compressed, more likely the right. The facet arthritis could contribute to back pain or referred facet syndrome.   IMPRESSION: 1. L4-5: Moderate bulging of the disc. Facet and ligamentous hypertrophy more pronounced on the right than the left. Small synovial cyst projecting inward from the right. Stenosis of the lateral recesses and foramina right more than left. Neural compression could occur at this level, particularly on the right. 2. L5-S1: Chronic disc degeneration with loss of disc height. Endplate osteophytes and bulging of the disc. Facet and ligamentous hypertrophy. Bilateral foraminal stenosis right more than left. Either L5 nerve could be compressed, more likely the right. 3. 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     Electronically Signed   By: Oneil Officer M.D.   On: 01/21/2021 02:55     Objective:  VS:  HT:    WT:   BMI:     BP:(!) 153/83  HR:74bpm  TEMP: ( )  RESP:  Physical Exam Vitals and nursing note reviewed.  Constitutional:      General: She is not in acute distress.     Appearance: Normal appearance. She is not ill-appearing.  HENT:     Head: Normocephalic and atraumatic.     Right Ear: External ear normal.     Left Ear: External ear normal.  Eyes:     Extraocular Movements: Extraocular movements intact.  Cardiovascular:     Rate and Rhythm: Normal rate.     Pulses: Normal pulses.  Pulmonary:     Effort: Pulmonary effort is normal. No respiratory distress.  Abdominal:     General: There is no distension.     Palpations: Abdomen is soft.  Musculoskeletal:        General: Tenderness present.     Cervical back: Neck supple.     Right lower leg: No edema.     Left lower leg: No edema.     Comments: Patient has good distal strength with no pain over the greater trochanters.  No clonus or focal weakness.  Skin:    Findings: No erythema, lesion or rash.  Neurological:     General: No focal deficit present.     Mental Status: She is alert and oriented to person, place, and time.     Sensory: No sensory deficit.     Motor: No weakness or abnormal muscle tone.     Coordination: Coordination normal.  Psychiatric:  Mood and Affect: Mood normal.        Behavior: Behavior normal.      Imaging: No results found.

## 2024-01-15 NOTE — Procedures (Signed)
 Lumbar Epidural Steroid Injection - Interlaminar Approach with Fluoroscopic Guidance  Patient: [name]      Date of Birth: 23-Aug-1956 MRN: [id number] PCP: Joyce Norleen BROCKS, MD      Visit Date: 01/07/2024   Universal Protocol:     Consent Given By: the patient  Position: PRONE  Additional Comments: Vital signs were monitored before and after the procedure. Patient was prepped and draped in the usual sterile fashion. The correct patient, procedure, and site was verified.   Injection Procedure Details:   Procedure diagnoses: Radiculopathy, lumbar region [M54.16]   Meds Administered:  Meds ordered this encounter  Medications   methylPREDNISolone  acetate (DEPO-MEDROL ) injection 40 mg     Laterality: Right  Location/Site:  L5-S1  Needle: 3.5 in., 20 ga. Tuohy  Needle Placement: Paramedian epidural  Findings:   -Comments: Excellent flow of contrast into the epidural space.  Procedure Details: Using a paramedian approach from the side mentioned above, the region overlying the inferior lamina was localized under fluoroscopic visualization and the soft tissues overlying this structure were infiltrated with 4 ml. of 1% Lidocaine without Epinephrine . The Tuohy needle was inserted into the epidural space using a paramedian approach.   The epidural space was localized using loss of resistance along with counter oblique bi-planar fluoroscopic views.  After negative aspirate for air, blood, and CSF, a 2 ml. volume of Isovue-250 was injected into the epidural space and the flow of contrast was observed. Radiographs were obtained for documentation purposes.    The injectate was administered into the level noted above.   Additional Comments:  No complications occurred Dressing: 2 x 2 sterile gauze and Band-Aid    Post-procedure details: Patient was observed during the procedure. Post-procedure instructions were reviewed.  Patient left the clinic in stable  condition.

## 2024-01-20 ENCOUNTER — Encounter: Payer: Self-pay | Admitting: Family Medicine

## 2024-01-20 ENCOUNTER — Encounter: Payer: Self-pay | Admitting: Internal Medicine

## 2024-01-20 ENCOUNTER — Telehealth: Payer: Self-pay

## 2024-01-20 ENCOUNTER — Telehealth (INDEPENDENT_AMBULATORY_CARE_PROVIDER_SITE_OTHER): Payer: 59 | Admitting: Family Medicine

## 2024-01-20 VITALS — Ht 66.0 in | Wt 226.0 lb

## 2024-01-20 DIAGNOSIS — J4530 Mild persistent asthma, uncomplicated: Secondary | ICD-10-CM

## 2024-01-20 DIAGNOSIS — J069 Acute upper respiratory infection, unspecified: Secondary | ICD-10-CM

## 2024-01-20 NOTE — Telephone Encounter (Signed)
I have noted this.  

## 2024-01-20 NOTE — Progress Notes (Signed)
   Subjective:    Patient ID: [name], female    DOB: 1956-06-13, 68 y.o.   MRN: [id number]  HPI Documentation for virtual audio and video telecommunications through Caregility encounter:  The patient was located at home. 2 patient identifiers used.  The provider was located in the office. The patient did consent to this visit and is aware of possible charges through their insurance for this visit. The other persons participating in this telemedicine service were none. Time spent on call was 5 minutes and in review of previous records >20 minutes total for counseling and coordination of care. This virtual service is not related to other E/M service within previous 7 days.  She developed URI symptoms Friday with nasal congestion, rhinorrhea and sneezing which made her asthma worse.  She has been taking her inhaler and is also using nebulizer 2-3 times per day.  The nebulizer did help.  She found some prednisone and took 20 mg of that yesterday and states that it helped her symptoms.  Review of Systems     Objective:    Physical Exam Alert and in no distress otherwise not examined       Assessment & Plan:  Asthma, chronic, mild persistent, uncomplicated  URI with cough and congestion Since she is already started with the prednisone I recommend that she take the 20 mg today and for the next couple of days to help with her breathing and hopefully after that she will not need it.  She was comfortable with that.

## 2024-01-20 NOTE — Telephone Encounter (Signed)
Copied from CRM [id number]. Topic: General - Other >> Jan 20, 2024 11:42 AM Franchot Heidelberg wrote: Reason for CRM: Pt called back reporting that she does not want to see Dr. Benjiman Core and prefers to only see Dr. Susann Givens.

## 2024-01-26 ENCOUNTER — Encounter: Payer: Self-pay | Admitting: Internal Medicine

## 2024-01-28 ENCOUNTER — Ambulatory Visit: Payer: Self-pay | Admitting: Family Medicine

## 2024-01-28 NOTE — Telephone Encounter (Signed)
  Chief Complaint: flank pain  Symptoms: pain  Frequency: comes  and goes   Disposition: [] ED /[] Urgent Care (no appt availability in office) / [x] Appointment(In office/virtual)/ []  [facility]/ [] Home Care/ [] Refused Recommended Disposition /[] [facility]/ []  Follow-up with PCP Additional Notes: Pt complaining of right flank pain. Pt rated pain 5. When up moving around, pt feels her back is straining. "I've had this same pain many years ago and they gave me an antibiotic. I just need that again." Pt denies any difficulty with urination, fever, SOB, or chest pains. Per protocol, pt to be seen within 3 days . Pt has appt 2/28 @ 1530. RN gave care advice and pt verbalized understanding.               Copied from CRM [id number]. Topic: Clinical - Red Word Triage >> Jan 28, 2024  9:56 AM Shon Hale wrote: Red Word that prompted transfer to Nurse Triage: Antibiotic requested, patient has pain in side. Feels like someone is punching her in her kidneys. Reason for Disposition  [1] MILD pain (i.e., scale 1-3; does not interfere with normal activities) AND [2] present > 3 days  Answer Assessment - Initial Assessment Questions 1. LOCATION: "Where does it hurt?" (e.g., left, right)     Right  2. ONSET: "When did the pain start?"     2 days ago 3. SEVERITY: "How bad is the pain?" (e.g., Scale 1-10; mild, moderate, or severe)   - MILD (1-3): doesn't interfere with normal activities    - MODERATE (4-7): interferes with normal activities or awakens from sleep    - SEVERE (8-10): excruciating pain and patient unable to do normal activities (stays in bed)       5 4. PATTERN: "Does the pain come and go, or is it constant?"      Comes and goes  5. CAUSE: "What do you think is causing the pain?"     Not  6. OTHER SYMPTOMS:  "Do you have any other symptoms?" (e.g., fever, abdomen pain, vomiting, leg weakness, burning with urination, blood in urine)     Denies all  Protocols  used: Flank Pain-A-AH

## 2024-01-29 ENCOUNTER — Ambulatory Visit: Payer: 59 | Admitting: Nurse Practitioner

## 2024-02-02 ENCOUNTER — Ambulatory Visit: Payer: 59 | Admitting: Medical

## 2024-02-02 ENCOUNTER — Other Ambulatory Visit: Payer: Self-pay | Admitting: Family Medicine

## 2024-02-02 DIAGNOSIS — F17200 Nicotine dependence, unspecified, uncomplicated: Secondary | ICD-10-CM

## 2024-02-02 DIAGNOSIS — J441 Chronic obstructive pulmonary disease with (acute) exacerbation: Secondary | ICD-10-CM

## 2024-02-08 ENCOUNTER — Ambulatory Visit: Admitting: Medical

## 2024-02-15 ENCOUNTER — Ambulatory Visit: Admitting: Medical

## 2024-02-15 VITALS — BP 120/70 | HR 73 | Wt 234.6 lb

## 2024-02-15 DIAGNOSIS — M545 Low back pain, unspecified: Secondary | ICD-10-CM | POA: Diagnosis not present

## 2024-02-15 DIAGNOSIS — E118 Type 2 diabetes mellitus with unspecified complications: Secondary | ICD-10-CM

## 2024-02-15 DIAGNOSIS — E669 Obesity, unspecified: Secondary | ICD-10-CM | POA: Diagnosis not present

## 2024-02-15 DIAGNOSIS — G8929 Other chronic pain: Secondary | ICD-10-CM | POA: Diagnosis not present

## 2024-02-15 DIAGNOSIS — N1831 Chronic kidney disease, stage 3a: Secondary | ICD-10-CM

## 2024-02-15 MED ORDER — TIRZEPATIDE 2.5 MG/0.5ML ~~LOC~~ SOAJ: 2.5000 mg | SUBCUTANEOUS | 0 refills | Status: DC

## 2024-02-15 MED ORDER — DOXYCYCLINE HYCLATE 100 MG PO TABS: 100.0000 mg | ORAL_TABLET | Freq: Two times a day (BID) | ORAL | 0 refills | Status: DC

## 2024-02-15 MED ORDER — TIRZEPATIDE 5 MG/0.5ML ~~LOC~~ SOAJ: 5.0000 mg | SUBCUTANEOUS | 0 refills | Status: DC

## 2024-02-15 MED ORDER — TRAMADOL HCL 50 MG PO TABS: 50.0000 mg | ORAL_TABLET | Freq: Two times a day (BID) | ORAL | 0 refills | Status: AC | PRN

## 2024-02-15 NOTE — Progress Notes (Signed)
 Subjective:  [name] is a 68 y.o. female who presents for Chief Complaint  Patient presents with   follow-up    Follow-up on sciatica and butt pain. Has patches for pain to put on. Would like antibiotic and prednisone     Here for follow-up on sciatica.  She has chronic low back pain.  She had recently seen her PCP here and orthopedics about the same issue.  She has been prescribed pain medicine.  She recently had a lumbar epidural injection she says that only really has been for relief for 2 weeks.  She has pain that radiates down her buttocks into her legs.  She is not doing a lot of stretching.  She has not seen physical therapist in a while  She also would like another round of antibiotics.  She has been having a few weeks of mucus and drainage and cough, some chest congestion.  No fever no bodyaches or chills.  No wheezing or shortness of breath  No other aggravating or relieving factors.    No other c/o.  Past Medical History:  Diagnosis Date   Arthritis    Asthma    Colonic polyp    GERD (gastroesophageal reflux disease)    Hypertension    Obesity    Pain in limb    Smoker    Swelling of limb    Current Outpatient Medications on File Prior to Visit  Medication Sig Dispense Refill   acetaminophen (TYLENOL) 500 MG tablet Take 1,000 mg by mouth every 6 (six) hours as needed for mild pain (pain score 1-3).     Blood Glucose Monitoring Suppl (ACCU-CHEK AVIVA PLUS) w/Device KIT USE AS DIRECTED TO CHECK BLOOD GLUCOSE 1 TO 2 TIMES DAILY 1 kit 0   diphenhydramine-acetaminophen (TYLENOL PM) 25-500 MG TABS tablet Take 1 tablet by mouth at bedtime as needed (sleep).     Fluticasone-Umeclidin-Vilant (TRELEGY ELLIPTA) 200-62.5-25 MCG/ACT AEPB Take 1 Inhalation by mouth daily. 60 each 5   ibuprofen (ADVIL) 800 MG tablet TAKE 1 TABLET BY MOUTH EVERY 8 HOURS AS NEEDED FOR MILD PAIN 90 tablet 0   levalbuterol (XOPENEX HFA) 45 MCG/ACT inhaler INHALE 2 PUFFS BY MOUTH EVERY 4  HOURS AS NEEDED FOR WHEEZING 15 g 0   lisinopril-hydrochlorothiazide (ZESTORETIC) 20-12.5 MG tablet Take 1 tablet by mouth daily. 90 tablet 3   metFORMIN (GLUCOPHAGE) 500 MG tablet Take 1 tablet (500 mg total) by mouth 2 (two) times daily with a meal. 180 tablet 1   methocarbamol (ROBAXIN) 500 MG tablet Take 1 tablet (500 mg total) by mouth 3 (three) times daily. 90 tablet 0   montelukast (SINGULAIR) 10 MG tablet Take 1 tablet (10 mg total) by mouth at bedtime. 30 tablet 0   pantoprazole (PROTONIX) 40 MG tablet Take 1 tablet (40 mg total) by mouth daily. 90 tablet 1   furosemide (LASIX) 20 MG tablet Take 1 tablet (20 mg total) by mouth daily. 30 tablet 3   No current facility-administered medications on file prior to visit.     The following portions of the patient's history were reviewed and updated as appropriate: allergies, current medications, past family history, past medical history, past social history, past surgical history and problem list.  ROS Otherwise as in subjective above    Objective: BP 120/70   Pulse 73   Wt 234 lb 9.6 oz (106.4 kg)   BMI 37.87 kg/m   BP Readings from Last 3 Encounters:  02/15/24 120/70  01/07/24 [name] Kitchen)  153/83  12/24/23 130/76   Wt Readings from Last 3 Encounters:  02/15/24 234 lb 9.6 oz (106.4 kg)  01/20/24 226 lb 0.6 oz (102.5 kg)  12/24/23 235 lb 3.2 oz (106.7 kg)   General appearance: alert, no distress, well developed, well nourished HEENT unremarkable Neck supple, nontender, no lymphadenopathy Lungs: Few rhonchi, some coarse breath sounds, otherwise no wheezing Tender lumbar paraspinal region bilaterally, pain with range of motion mostly confined to the low back Obese Legs nontender with relatively normal range of motion Legs neurovascularly intact      Assessment: Encounter Diagnoses  Name Primary?   Chronic low back pain, unspecified back pain laterality, unspecified whether sciatica present Yes   Type 2 diabetes mellitus  with complications (HCC)    Stage 3a chronic kidney disease (HCC)    Obesity (BMI 30-39.9)      Plan: Discussed concerns  We discussed as a way to help with her back pain to try work on weight loss  She is agreeable to trial of Mounjaro for both diabetes and to help with weight.  Assuming we can get this covered by insurance we will begin trial Mounjaro  I refilled her tramadol she can use for as needed use.  Referral to physical therapy.  Work on stretching.   Recommendations To help control diabetes and lose weight, begin trial of Mounjaro weekly self injection Begin Mounjaro 2.5mg  weekly for 1 month After 1 month, increase to [facility] 5mg  weekly Check your sugars every morning.  Goal is 80-130 fasting in the morning Once you start the [facility] 5mg  dose after 30 days, we may ended up stopping Metformin If your blood sugars are running fasting below 101 in the next month, then you will need to stop metformin as we go up on the Mounjaro dose If you can get some weight off, it is likely that some of your back pains will improve We are placing referral back to physical therapy today Try to do some stretches every day I recommend you consider low impact exercise such as stationary bike or elliptical or doing water aerobics at the [facility] You can use over-the-counter Tylenol 325 mg or 500 mg up to twice a day for mild pain You can use your tramadol sparingly for worse pain up to twice a day only given day You can use your Robaxin muscle laxer that you have at home up to twice a day as needed for spasm and tightness of the muscles You can use Mucinex over-the-counter twice a day for the next few days to help with mucus.  Begin 5 days of doxycycline antibiotic  [name] was seen today for follow-up.  Diagnoses and all orders for this visit:  Chronic low back pain, unspecified back pain laterality, unspecified whether sciatica present -     Ambulatory referral to Physical Therapy  Type 2  diabetes mellitus with complications (HCC)  Stage 3a chronic kidney disease (HCC)  Obesity (BMI 30-39.9)  Other orders -     tirzepatide [facility]) 2.5 MG/0.5ML Pen; Inject 2.5 mg into the skin once a week. -     tirzepatide [facility]) 5 MG/0.5ML Pen; Inject 5 mg into the skin once a week. -     traMADol (ULTRAM) 50 MG tablet; Take 1 tablet (50 mg total) by mouth 2 (two) times daily as needed. -     doxycycline (VIBRA-TABS) 100 MG tablet; Take 1 tablet (100 mg total) by mouth 2 (two) times daily.    Follow up: 4-6 weeks  with Dr. Susann Givens

## 2024-02-15 NOTE — Patient Instructions (Signed)
 Recommendations To help control diabetes and lose weight, begin trial of Mounjaro weekly self injection Begin Mounjaro 2.5mg  weekly for 1 month After 1 month, increase to [facility] 5mg  weekly Check your sugars every morning.  Goal is 80-130 fasting in the morning Once you start the [facility] 5mg  dose after 30 days, we may ended up stopping Metformin If your blood sugars are running fasting below 101 in the next month, then you will need to stop metformin as we go up on the Mounjaro dose If you can get some weight off, it is likely that some of your back pains will improve We are placing referral back to physical therapy today Try to do some stretches every day I recommend you consider low impact exercise such as stationary bike or elliptical or doing water aerobics at the [facility] You can use over-the-counter Tylenol 325 mg or 500 mg up to twice a day for mild pain You can use your tramadol sparingly for worse pain up to twice a day only given day You can use your Robaxin muscle laxer that you have at home up to twice a day as needed for spasm and tightness of the muscles You can use Mucinex over-the-counter twice a day for the next few days to help with mucus.  Begin 5 days of doxycycline antibiotic

## 2024-02-18 ENCOUNTER — Telehealth: Payer: Self-pay

## 2024-02-18 NOTE — Telephone Encounter (Signed)
 Patient called and she says she's been taking ibuprofen for back pain. Advised it's ok to take ibuprofen while taking mounjaro and to take it with food. She says she does eat when she takes ibuprofen and she was told she can take Tylenol as well.    Copied from CRM [id number]. Topic: Clinical - Medication Question >> Feb 18, 2024  3:27 PM Turkey B wrote: Reason for CRM: pt called in wants to know if its ok to take ibuprofen with Monjauro.

## 2024-02-19 ENCOUNTER — Telehealth: Payer: Self-pay | Admitting: Internal Medicine

## 2024-02-19 NOTE — Telephone Encounter (Signed)
 Spoke to patient and she will see if its the battery, if not she will see if pharmacist can help and if she needs a new one then she will call us back to get a new one sent in  Copied from CRM [id number]. Topic: Clinical - Medical Advice >> Feb 19, 2024  3:09 PM Marlow Baars wrote: Reason for CRM: The patient called in stating she was having difficulty with her lood Glucose Monitoring Suppl (ACCU-CHEK AVIVA PLUS. She says it is not working properly and she is getting ready to get [facility] and needs it to work. She said she may speak with her pharmacist but if someone could call her to assist her further she would appreciate it.

## 2024-02-22 ENCOUNTER — Telehealth: Payer: Self-pay | Admitting: Physical Medicine and Rehabilitation

## 2024-02-22 NOTE — Telephone Encounter (Signed)
 Patient called and said that she still in pain from the needle. CB#[phone]

## 2024-02-23 ENCOUNTER — Ambulatory Visit: Attending: Medical

## 2024-02-23 ENCOUNTER — Other Ambulatory Visit: Payer: Self-pay

## 2024-02-23 DIAGNOSIS — M545 Low back pain, unspecified: Secondary | ICD-10-CM | POA: Diagnosis not present

## 2024-02-23 DIAGNOSIS — G8929 Other chronic pain: Secondary | ICD-10-CM | POA: Insufficient documentation

## 2024-02-23 DIAGNOSIS — R2689 Other abnormalities of gait and mobility: Secondary | ICD-10-CM | POA: Insufficient documentation

## 2024-02-23 DIAGNOSIS — M5459 Other low back pain: Secondary | ICD-10-CM | POA: Diagnosis not present

## 2024-02-23 DIAGNOSIS — M6281 Muscle weakness (generalized): Secondary | ICD-10-CM | POA: Diagnosis not present

## 2024-02-23 NOTE — Therapy (Signed)
 OUTPATIENT PHYSICAL THERAPY THORACOLUMBAR EVALUATION   Patient Name: [name] MRN: [id number] DOB:08-May-1956, 68 y.o., female Today's Date: 02/24/2024  END OF SESSION:  PT End of Session - 02/24/24 0900     Visit Number 1    Number of Visits 17    Date for PT Re-Evaluation 04/20/24    Authorization Type [employer] Dual Complete    PT Start Time 1445    PT Stop Time 1525    PT Time Calculation (min) 40 min    Activity Tolerance Patient tolerated treatment well    Behavior During Therapy WFL for tasks assessed/performed             Past Medical History:  Diagnosis Date   Arthritis    Asthma    Colonic polyp    GERD (gastroesophageal reflux disease)    Hypertension    Obesity    Pain in limb    Smoker    Swelling of limb    Past Surgical History:  Procedure Laterality Date   BIOPSY BREAST Left 09/22/2018   no maglignancy   CATARACT EXTRACTION Right 05/25/2023   COLONOSCOPY  2009   Dr. Marina Goodell   POLYPECTOMY     Patient Active Problem List   Diagnosis Date Noted   Myopathy, unspecified 07/15/2023   COPD (chronic obstructive pulmonary disease) (HCC) 11/30/2022   Stage 3a chronic kidney disease (HCC) 01/23/2021   History of cataract surgery 04/27/2020   Multiple somatic complaints 01/10/2019   Adenomatous polyp of colon 05/25/2018   Asthma, chronic, mild persistent, uncomplicated 03/06/2017   Chronic seasonal allergic rhinitis due to pollen 03/06/2017   Gastroesophageal reflux disease without esophagitis 10/10/2014   Ex-cigarette smoker 09/28/2013   Type 2 diabetes mellitus with complications (HCC) 07/12/2012   Obesity (BMI 30-39.9) 07/12/2012   Chronic back pain 07/12/2012   Hypertension associated with diabetes (HCC) 07/12/2012    PCP: Ronnald Nian, MD  REFERRING PROVIDER: Jac Canavan, PA-C   REFERRING DIAG: [phone] (ICD-10-CM) - Chronic low back pain, unspecified back pain laterality, unspecified whether sciatica present    Rationale for Evaluation and Treatment: Rehabilitation  THERAPY DIAG:  Other low back pain - Plan: PT plan of care cert/re-cert  Muscle weakness (generalized) - Plan: PT plan of care cert/re-cert  Other abnormalities of gait and mobility - Plan: PT plan of care cert/re-cert  ONSET DATE: Chronic  SUBJECTIVE:                                                                                                                                                                                           SUBJECTIVE STATEMENT: Pt  presents to PT with reports of chronic LBP with refers into posterior LE, R>L. Pt is well known to clinic, has had past success to PT. Denies bowel/bladder changes or saddle anesthesia. Also denies N/T down LE and pain does not go distal to knee. Has had a lot of issues with LE cramping lately, also is frustrated by her inability to walk or stand longer than 10 minutes.   PERTINENT HISTORY:  HTN, DM II, COPD  PAIN:  Are you having pain?  Yes: NPRS scale: 8/10 Pain location: lower back R>L, posterior hip R>L Pain description: sharp, tight, sore Aggravating factors: mornings, prolonged standing, walking Relieving factors: massage, medication  PRECAUTIONS: None  RED FLAGS: None   WEIGHT BEARING RESTRICTIONS: No  FALLS:  Has patient fallen in last 6 months? No  LIVING ENVIRONMENT: Lives with: lives alone Lives in: House/apartment Stairs: Yes: External: 12 steps; on right going up Has following equipment at home: Single point cane and Walker - 2 wheeled  OCCUPATION: Retired - Does [employer] a few times a week  PLOF: Independent  PATIENT GOALS: pt wants to decrease pain in her lower back to improve comfort with walking and shopping  NEXT MD VISIT: 03/01/2024  OBJECTIVE:  Note: Objective measures were completed at Evaluation unless otherwise noted.  DIAGNOSTIC FINDINGS:  See imaging  PATIENT SURVEYS:  ODI: 74% disability  (37/50)  COGNITION: Overall cognitive status: Within functional limits for tasks assessed     SENSATION: [facility]  MUSCLE LENGTH: Thomas test: Right (+); Left (+)  POSTURE: rounded shoulders, forward head, and increased lumbar lordosis  PALPATION: TTP to bilateral lumbar paraspinals  LUMBAR ROM:   AROM eval  Flexion 50%  Extension 25%  Right lateral flexion   Left lateral flexion   Right rotation   Left rotation    (Blank rows = not tested)  LOWER EXTREMITY MMT:    MMT Right eval Left eval  Hip flexion 3+/5 3+/5  Hip extension    Hip abduction 3+/5 3+/5  Hip adduction    Hip internal rotation    Hip external rotation    Knee flexion 4/5 4/5  Knee extension 4/5 4/5  Ankle dorsiflexion    Ankle plantarflexion    Ankle inversion    Ankle eversion     (Blank rows = not tested)  LUMBAR SPECIAL TESTS:  Straight leg raise test: Negative and Slump test: Negative  FUNCTIONAL TESTS:  30 Second Sit to Stand: 8 reps  GAIT: Distance walked: 110ft Assistive device utilized: Single point cane Level of assistance: SBA Comments: trunk flexed, decreased gait speed  TREATMENT: [facility] Adult PT Treatment:                                                DATE: 02/24/2024 Therapeutic Exercise: LTR x 10 PPT x 5 - 5" hold Modified thomas x 30" R Seated fig 4 stretch x 30" each STS x 10 Seated hamstring stretch x 30" each  PATIENT EDUCATION:  Education details: eval findings, ODI, HEP, POC Person educated: Patient Education method: Explanation, Demonstration, and Handouts Education comprehension: verbalized understanding and returned demonstration  HOME EXERCISE PROGRAM: Access Code: [email or web address] URL: [email or web address] Date: 02/23/2024 Prepared by: Edwinna Areola  Exercises - Supine Lower Trunk Rotation  - 1-2 x daily - 7 x weekly - 2 sets - 10 reps - Supine Posterior Pelvic  Tilt  - 1-2 x daily - 7 x weekly - 2 sets - 10 reps - 5 sec hold - Modified Thomas  Stretch  - 1-2 x daily - 7 x weekly - 2 reps - 30 sec hold - Seated Figure 4 Piriformis Stretch  - 1-2 x daily - 7 x weekly - 2 reps - 30 sec hold - Sit to Stand  - 1-2 x daily - 7 x weekly - 3 sets - 10 reps - Seated Hamstring Stretch  - 1-2 x daily - 7 x weekly - 2 reps - 30 sec hold  ASSESSMENT:  CLINICAL IMPRESSION: Patient is a 68 y.o. F who was seen today for physical therapy evaluation and treatment for chronic lower back pain and discomfort that radiates into bilateral LE;. Physical findings are consistent with referring provider impression as pt demonstrates decrease in core and hip strength as well as sharp decline in functional mobility. Oswestry score indicates severe disability in performance of home ADLs and higher level community activities. Pt would benefit from skilled PT services working on improving core/hip strength in order to decrease pain.    OBJECTIVE IMPAIRMENTS: Abnormal gait, decreased activity tolerance, decreased balance, decreased endurance, decreased mobility, difficulty walking, decreased ROM, decreased strength, improper body mechanics, and pain   ACTIVITY LIMITATIONS: carrying, lifting, sitting, standing, squatting, stairs, transfers, reach over head, and locomotion level  PARTICIPATION LIMITATIONS: meal prep, cleaning, driving, shopping, community activity, occupation, and yard work  PERSONAL FACTORS: Time since onset of injury/illness/exacerbation and 3+ comorbidities: HTN, DM II, COPD  are also affecting patient's functional outcome.   REHAB POTENTIAL: Good  CLINICAL DECISION MAKING: Evolving/moderate complexity  EVALUATION COMPLEXITY: Moderate   GOALS: Goals reviewed with patient? No  SHORT TERM GOALS: Target date: 03/15/2024   Pt will be compliant and knowledgeable with initial HEP for improved comfort and carryover Baseline: initial HEP given  Goal status: INITIAL  2.  Pt will self report low back and LE pain no greater than 7/10 for improved  comfort and functional ability Baseline: 10/10 at worst Goal status: INITIAL   LONG TERM GOALS: Target date: 04/20/2024   Pt will be decrease ODI disability score to no greater than 50% (25/50) as proxy for functional improvement with home ADLs and community activites Baseline: 74% disability (37/50) Goal status: INITIAL  2.  Pt will self report low back and LE pain no greater than 3/10 for improved comfort and functional ability Baseline: 10/10 at worst Goal status: INITIAL   3.  Pt will increase 30 Second Sit to Stand rep count to no less than 10 reps for improved balance, strength, and functional mobility Baseline: 8 reps  Goal status: INITIAL   4.  Pt will be able to improve standing and walking activity tolerance to at least 20 minutes for improved functional ability with community navigation Baseline: 10 minutes Goal status: INITIAL   PLAN:  PT FREQUENCY: 2x/week  PT DURATION: 8 weeks  PLANNED INTERVENTIONS: 97164- PT Re-evaluation, 97110-Therapeutic exercises, 97530- Therapeutic activity, [id number]- Neuromuscular re-education, 97535- Self Care, [id number]- Manual therapy, [id number]- Aquatic Therapy, [id number]- Electrical stimulation (unattended), [id number]- Electrical stimulation (manual), Dry Needling, Cryotherapy, and Moist heat.  PLAN FOR NEXT SESSION: assess HEP response, core/hip strengthening, hip flexor stretching   Eloy End, PT 02/24/2024, 10:21 AM

## 2024-02-24 ENCOUNTER — Encounter: Payer: Self-pay | Admitting: Family Medicine

## 2024-02-29 ENCOUNTER — Ambulatory Visit

## 2024-02-29 DIAGNOSIS — M6281 Muscle weakness (generalized): Secondary | ICD-10-CM | POA: Diagnosis not present

## 2024-02-29 DIAGNOSIS — M545 Low back pain, unspecified: Secondary | ICD-10-CM | POA: Diagnosis not present

## 2024-02-29 DIAGNOSIS — G8929 Other chronic pain: Secondary | ICD-10-CM | POA: Diagnosis not present

## 2024-02-29 DIAGNOSIS — M5459 Other low back pain: Secondary | ICD-10-CM

## 2024-02-29 DIAGNOSIS — R2689 Other abnormalities of gait and mobility: Secondary | ICD-10-CM | POA: Diagnosis not present

## 2024-02-29 NOTE — Therapy (Signed)
 OUTPATIENT PHYSICAL THERAPY TREATMENT   Patient Name: [name] MRN: [id number] DOB:[birth year], 68 y.o., female Today's Date: 02/29/2024  END OF SESSION:  PT End of Session - 02/29/24 1447     Visit Number 2    Number of Visits 17    Date for PT Re-Evaluation 04/20/24    Authorization Type [employer] Dual Complete    PT Start Time 1530    PT Stop Time 1612    PT Time Calculation (min) 42 min    Activity Tolerance Patient tolerated treatment well    Behavior During Therapy WFL for tasks assessed/performed              Past Medical History:  Diagnosis Date   Arthritis    Asthma    Colonic polyp    GERD (gastroesophageal reflux disease)    Hypertension    Obesity    Pain in limb    Smoker    Swelling of limb    Past Surgical History:  Procedure Laterality Date   BIOPSY BREAST Left 09/22/2018   no maglignancy   CATARACT EXTRACTION Right 05/25/2023   COLONOSCOPY  2009   Dr. Marina Goodell   POLYPECTOMY     Patient Active Problem List   Diagnosis Date Noted   Myopathy, unspecified 07/15/2023   COPD (chronic obstructive pulmonary disease) (HCC) 11/30/2022   Stage 3a chronic kidney disease (HCC) 01/23/2021   History of cataract surgery 04/27/2020   Multiple somatic complaints 01/10/2019   Adenomatous polyp of colon 05/25/2018   Asthma, chronic, mild persistent, uncomplicated 03/06/2017   Chronic seasonal allergic rhinitis due to pollen 03/06/2017   Gastroesophageal reflux disease without esophagitis 10/10/2014   Ex-cigarette smoker 09/28/2013   Type 2 diabetes mellitus with complications (HCC) 07/12/2012   Obesity (BMI 30-39.9) 07/12/2012   Chronic back pain 07/12/2012   Hypertension associated with diabetes (HCC) 07/12/2012    PCP: Ronnald Nian, MD  REFERRING PROVIDER: Jac Canavan, PA-C   REFERRING DIAG: [phone] (ICD-10-CM) - Chronic low back pain, unspecified back pain laterality, unspecified whether sciatica present   Rationale for  Evaluation and Treatment: Rehabilitation  THERAPY DIAG:  Other low back pain  Muscle weakness (generalized)  ONSET DATE: Chronic  SUBJECTIVE:                                                                                                                                                                                           SUBJECTIVE STATEMENT: Pt presents to PT with continued reports of hip and lower back pain. Has been compliant with HEP.   EVAL: Pt presents to PT with reports of chronic  LBP with refers into posterior LE, R>L. Pt is well known to clinic, has had past success to PT. Denies bowel/bladder changes or saddle anesthesia. Also denies N/T down LE and pain does not go distal to knee. Has had a lot of issues with LE cramping lately, also is frustrated by her inability to walk or stand longer than 10 minutes.   PERTINENT HISTORY:  HTN, DM II, COPD  PAIN:  Are you having pain?  Yes: NPRS scale: 8/10 Pain location: lower back R>L, posterior hip R>L Pain description: sharp, tight, sore Aggravating factors: mornings, prolonged standing, walking Relieving factors: massage, medication  PRECAUTIONS: None  RED FLAGS: None   WEIGHT BEARING RESTRICTIONS: No  FALLS:  Has patient fallen in last 6 months? No  LIVING ENVIRONMENT: Lives with: lives alone Lives in: House/apartment Stairs: Yes: External: 12 steps; on right going up Has following equipment at home: Single point cane and Walker - 2 wheeled  OCCUPATION: Retired - Does [employer] a few times a week  PLOF: Independent  PATIENT GOALS: pt wants to decrease pain in her lower back to improve comfort with walking and shopping  NEXT MD VISIT: 03/01/2024  OBJECTIVE:  Note: Objective measures were completed at Evaluation unless otherwise noted.  DIAGNOSTIC FINDINGS:  See imaging  PATIENT SURVEYS:  ODI: 74% disability (37/50)  COGNITION: Overall cognitive status: Within functional limits for  tasks assessed     SENSATION: [facility]  MUSCLE LENGTH: Thomas test: Right (+); Left (+)  POSTURE: rounded shoulders, forward head, and increased lumbar lordosis  PALPATION: TTP to bilateral lumbar paraspinals  LUMBAR ROM:   AROM eval  Flexion 50%  Extension 25%  Right lateral flexion   Left lateral flexion   Right rotation   Left rotation    (Blank rows = not tested)  LOWER EXTREMITY MMT:    MMT Right eval Left eval  Hip flexion 3+/5 3+/5  Hip extension    Hip abduction 3+/5 3+/5  Hip adduction    Hip internal rotation    Hip external rotation    Knee flexion 4/5 4/5  Knee extension 4/5 4/5  Ankle dorsiflexion    Ankle plantarflexion    Ankle inversion    Ankle eversion     (Blank rows = not tested)  LUMBAR SPECIAL TESTS:  Straight leg raise test: Negative and Slump test: Negative  FUNCTIONAL TESTS:  30 Second Sit to Stand: 8 reps  GAIT: Distance walked: 58ft Assistive device utilized: Single point cane Level of assistance: SBA Comments: trunk flexed, decreased gait speed  TREATMENT: [facility] Adult PT Treatment:                                                DATE: 02/29/2024 Therapeutic Exercise: Seated hamstring stretch 2x30" each PPT x 10 - 5" hold PPT with ball x 10 - 5" hold LTR x 10 Seated clamshell 2x15 blue band Repeated flexion in sitting x 15 with physioball Seated fig 4 stretch 2x30" each LAQ 2x10 2.5# Therapeutic Activity:  NuStep lvl 5 UE/LE x 5 min for improving functional activity tolerance STS 2x10  [facility] Adult PT Treatment:  DATE: 02/24/2024 Therapeutic Exercise: LTR x 10 PPT x 5 - 5" hold Modified thomas x 30" R Seated fig 4 stretch x 30" each STS x 10 Seated hamstring stretch x 30" each  PATIENT EDUCATION:  Education details: eval findings, ODI, HEP, POC Person educated: Patient Education method: Explanation, Demonstration, and Handouts Education comprehension: verbalized  understanding and returned demonstration  HOME EXERCISE PROGRAM: Access Code: [email or web address] URL: [email or web address] Date: 02/23/2024 Prepared by: Edwinna Areola  Exercises - Supine Lower Trunk Rotation  - 1-2 x daily - 7 x weekly - 2 sets - 10 reps - Supine Posterior Pelvic Tilt  - 1-2 x daily - 7 x weekly - 2 sets - 10 reps - 5 sec hold - Modified Thomas Stretch  - 1-2 x daily - 7 x weekly - 2 reps - 30 sec hold - Seated Figure 4 Piriformis Stretch  - 1-2 x daily - 7 x weekly - 2 reps - 30 sec hold - Sit to Stand  - 1-2 x daily - 7 x weekly - 3 sets - 10 reps - Seated Hamstring Stretch  - 1-2 x daily - 7 x weekly - 2 reps - 30 sec hold  ASSESSMENT:  CLINICAL IMPRESSION: Pt was able to complete all prescribed exercises with no adverse effect. Therapy focused on improving core and hip strength in order to decrease pain and improve mobility. Also focused on functional activities for improving comfort with transfers and ADLs. Pt continues to benefit from skilled PT services, will continue to progress as able.   EVAL: Patient is a 68 y.o. F who was seen today for physical therapy evaluation and treatment for chronic lower back pain and discomfort that radiates into bilateral LE;. Physical findings are consistent with referring provider impression as pt demonstrates decrease in core and hip strength as well as sharp decline in functional mobility. Oswestry score indicates severe disability in performance of home ADLs and higher level community activities. Pt would benefit from skilled PT services working on improving core/hip strength in order to decrease pain.    OBJECTIVE IMPAIRMENTS: Abnormal gait, decreased activity tolerance, decreased balance, decreased endurance, decreased mobility, difficulty walking, decreased ROM, decreased strength, improper body mechanics, and pain   ACTIVITY LIMITATIONS: carrying, lifting, sitting, standing, squatting, stairs, transfers, reach over head,  and locomotion level  PARTICIPATION LIMITATIONS: meal prep, cleaning, driving, shopping, community activity, occupation, and yard work  PERSONAL FACTORS: Time since onset of injury/illness/exacerbation and 3+ comorbidities: HTN, DM II, COPD  are also affecting patient's functional outcome.   REHAB POTENTIAL: Good  CLINICAL DECISION MAKING: Evolving/moderate complexity  EVALUATION COMPLEXITY: Moderate   GOALS: Goals reviewed with patient? No  SHORT TERM GOALS: Target date: 03/15/2024   Pt will be compliant and knowledgeable with initial HEP for improved comfort and carryover Baseline: initial HEP given  Goal status: INITIAL  2.  Pt will self report low back and LE pain no greater than 7/10 for improved comfort and functional ability Baseline: 10/10 at worst Goal status: INITIAL   LONG TERM GOALS: Target date: 04/20/2024   Pt will be decrease ODI disability score to no greater than 50% (25/50) as proxy for functional improvement with home ADLs and community activites Baseline: 74% disability (37/50) Goal status: INITIAL  2.  Pt will self report low back and LE pain no greater than 3/10 for improved comfort and functional ability Baseline: 10/10 at worst Goal status: INITIAL   3.  Pt will increase 30 Second Sit to Stand rep count to  no less than 10 reps for improved balance, strength, and functional mobility Baseline: 8 reps  Goal status: INITIAL   4.  Pt will be able to improve standing and walking activity tolerance to at least 20 minutes for improved functional ability with community navigation Baseline: 10 minutes Goal status: INITIAL   PLAN:  PT FREQUENCY: 2x/week  PT DURATION: 8 weeks  PLANNED INTERVENTIONS: 97164- PT Re-evaluation, 97110-Therapeutic exercises, 97530- Therapeutic activity, [id number]- Neuromuscular re-education, 97535- Self Care, [id number]- Manual therapy, [id number]- Aquatic Therapy, [id number]- Electrical stimulation (unattended), [id number]- Electrical stimulation  (manual), Dry Needling, Cryotherapy, and Moist heat.  PLAN FOR NEXT SESSION: assess HEP response, core/hip strengthening, hip flexor stretching   Eloy End, PT 02/29/2024, 5:02 PM

## 2024-03-01 ENCOUNTER — Ambulatory Visit: Admitting: Physical Medicine and Rehabilitation

## 2024-03-01 ENCOUNTER — Encounter: Payer: Self-pay | Admitting: Physical Medicine and Rehabilitation

## 2024-03-01 DIAGNOSIS — G8929 Other chronic pain: Secondary | ICD-10-CM | POA: Diagnosis not present

## 2024-03-01 DIAGNOSIS — M7918 Myalgia, other site: Secondary | ICD-10-CM

## 2024-03-01 DIAGNOSIS — M5416 Radiculopathy, lumbar region: Secondary | ICD-10-CM

## 2024-03-01 DIAGNOSIS — M48061 Spinal stenosis, lumbar region without neurogenic claudication: Secondary | ICD-10-CM

## 2024-03-01 NOTE — Progress Notes (Signed)
 Pain Scale   Average Pain 8 Patient states she is having pain in right hip and left hip       +Driver, -BT, -Dye Allergies.

## 2024-03-01 NOTE — Progress Notes (Signed)
 [name] - 68 y.o. female MRN [id number]  Date of birth: May 21, 1956  Office Visit Note: Visit Date: 03/01/2024 PCP: Ronnald Nian, MD Referred by: Ronnald Nian, MD  Subjective: Chief Complaint  Patient presents with   Right Hip - Pain   Left Hip - Pain   HPI: [name] is a 68 y.o. female who comes in today for evaluation of chronic, worsening and severe bilateral buttock pain radiating to posterior thighs down to knees. Pain ongoing for several years, worsened with activity and movement. She describes pain as sore and aching, feels like she is being punched in her buttock. Currently rates as 10 out of 10. Some relief of pain with home exercise regimen, rest and use of medications. History of formal physical therapy several years ago with some relief of pain.  Lumbar MRI imaging from 2022 shows facet and ligamentous hypertrophy more pronounced on the right than the left at L4-L5. There is bilateral foraminal stenosis at L5-S1, either L5 nerve root could be compressed. There is also facet arthritis at this level that could contribute to lower back pain. No high grade spinal canal stenosis noted. Patient underwent right L5-S1 interlaminar epidural steroid injection in our office on 01/07/2024, no relief of pain with this injection. She is currently using cane to assist with ambulation. Patient denies focal weakness, numbness and tingling. No recent trauma or falls.      Review of Systems  Musculoskeletal:  Positive for back pain.  Neurological:  Negative for tingling, sensory change, focal weakness and weakness.  All other systems reviewed and are negative.  Otherwise per HPI.  Assessment & Plan: Visit Diagnoses:    ICD-10-CM   1. Chronic buttock pain  M79.18 MR LUMBAR SPINE WO CONTRAST   G89.29     2. Lumbar radiculopathy  M54.16 MR LUMBAR SPINE WO CONTRAST    3. Foraminal stenosis of lumbar region  M48.061 MR LUMBAR SPINE WO CONTRAST       Plan:  Findings:  Chronic, worsening and severe bilateral buttock pain radiating to posterior thighs down to knees. Patient continues to have severe pain despite good conservative therapies such as formal physical therapy, home exercise regimen, rest and use of medications. Her pain is now bilateral and seems to be most consistent with S1 nerve pattern. Does seem her pain has changed since our prior office visit in February. No relief with recent right L5-S1 interlaminar epidural steroid injection in our office. We discussed treatment options in detail today, next step is to obtain new lumbar MRI imaging. Depending on results of MRI imaging we discussed possibility of performing lumbar injections. Patient has no questions at this time. I will see her back for lumbar MRI review. No red flag symptoms noted upon exam today.     Meds & Orders: No orders of the defined types were placed in this encounter.   Orders Placed This Encounter  Procedures   MR LUMBAR SPINE WO CONTRAST    Follow-up: Return for Lumbar MRI review.   Procedures: No procedures performed      Clinical History: CLINICAL DATA:  Low back pain with bilateral hip and bilateral leg pain.   EXAM: MRI LUMBAR SPINE WITHOUT CONTRAST   TECHNIQUE: Multiplanar, multisequence MR imaging of the lumbar spine was performed. No intravenous contrast was administered.   COMPARISON:  Radiography 12/25/2020.   FINDINGS: Segmentation:  5 lumbar type vertebral bodies.   Alignment:  Normal   Vertebrae: No fracture  or primary bone lesion. Chronic discogenic endplate changes at L5-S1 without active edema.   Conus medullaris and cauda equina: Conus extends to the L1-2 level. Conus and cauda equina appear normal.   Paraspinal and other soft tissues: Negative   Disc levels:   T12-L1 and L1-2 are normal.   L2-3 and L3-4 show minimal non-compressive bulging of the discs.   L4-5: Moderate bulging of the disc. Facet and ligamentous hypertrophy  more pronounced on the right than the left. Small synovial cyst projecting inward from the right. Stenosis of the lateral recesses and foramina right more than left. Neural compression could occur at this level, particularly on the right. Facet arthritis could contribute to back pain or referred facet syndrome.   L5-S1: Chronic disc degeneration with loss of disc height. Endplate osteophytes and bulging of the disc. Facet and ligamentous hypertrophy. No central canal stenosis. Bilateral foraminal stenosis right more than left. Either L5 nerve could be compressed, more likely the right. The facet arthritis could contribute to back pain or referred facet syndrome.   IMPRESSION: 1. L4-5: Moderate bulging of the disc. Facet and ligamentous hypertrophy more pronounced on the right than the left. Small synovial cyst projecting inward from the right. Stenosis of the lateral recesses and foramina right more than left. Neural compression could occur at this level, particularly on the right. 2. L5-S1: Chronic disc degeneration with loss of disc height. Endplate osteophytes and bulging of the disc. Facet and ligamentous hypertrophy. Bilateral foraminal stenosis right more than left. Either L5 nerve could be compressed, more likely the right. 3. 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     Electronically Signed   By: Paulina Fusi M.D.   On: 01/21/2021 02:55   She reports that she has quit smoking. Her smoking use included cigarettes. She has a 2.5 pack-year smoking history. She has never used smokeless tobacco.  Recent Labs    09/24/23 1620  HGBA1C 7.0*    Objective:  VS:  HT:    WT:   BMI:     BP:   HR: bpm  TEMP: ( )  RESP:  Physical Exam Vitals and nursing note reviewed.  HENT:     Head: Normocephalic and atraumatic.     Right Ear: External ear normal.     Left Ear: External ear normal.      Nose: Nose normal.     Mouth/Throat:     Mouth: Mucous membranes are moist.  Eyes:     Extraocular Movements: Extraocular movements intact.  Cardiovascular:     Rate and Rhythm: Normal rate.     Pulses: Normal pulses.  Pulmonary:     Effort: Pulmonary effort is normal.  Abdominal:     General: Abdomen is flat. There is no distension.  Musculoskeletal:        General: Tenderness present.     Cervical back: Normal range of motion.     Comments: Patient rises from seated position to standing without difficulty. Good lumbar range of motion. No pain noted with facet loading. 5/5 strength noted with bilateral hip flexion, knee flexion/extension, ankle dorsiflexion/plantarflexion and EHL. No clonus noted bilaterally. No pain upon palpation of greater trochanters. No pain with internal/external rotation of bilateral hips. Sensation intact bilaterally. Dysesthesias noted to bilateral S1 dermatomes. Negative slump test bilaterally. Ambulates without aid, gait steady.     Skin:    General: Skin is  warm and dry.     Capillary Refill: Capillary refill takes less than 2 seconds.  Neurological:     Mental Status: She is alert and oriented to person, place, and time.     Gait: Gait abnormal.  Psychiatric:        Mood and Affect: Mood normal.        Behavior: Behavior normal.     Ortho Exam  Imaging: No results found.  Past Medical/Family/Surgical/Social History: Medications & Allergies reviewed per EMR, new medications updated. Patient Active Problem List   Diagnosis Date Noted   Myopathy, unspecified 07/15/2023   COPD (chronic obstructive pulmonary disease) (HCC) 11/30/2022   Stage 3a chronic kidney disease (HCC) 01/23/2021   History of cataract surgery 04/27/2020   Multiple somatic complaints 01/10/2019   Adenomatous polyp of colon 05/25/2018   Asthma, chronic, mild persistent, uncomplicated 03/06/2017   Chronic seasonal allergic rhinitis due to pollen 03/06/2017   Gastroesophageal  reflux disease without esophagitis 10/10/2014   Ex-cigarette smoker 09/28/2013   Type 2 diabetes mellitus with complications (HCC) 07/12/2012   Obesity (BMI 30-39.9) 07/12/2012   Chronic back pain 07/12/2012   Hypertension associated with diabetes (HCC) 07/12/2012   Past Medical History:  Diagnosis Date   Arthritis    Asthma    Colonic polyp    GERD (gastroesophageal reflux disease)    Hypertension    Obesity    Pain in limb    Smoker    Swelling of limb    Family History  Problem Relation Age of Onset   Lung cancer Father    Cirrhosis Mother    Cirrhosis Brother    Colon cancer Neg Hx    Colon polyps Neg Hx    Esophageal cancer Neg Hx    Rectal cancer Neg Hx    Stomach cancer Neg Hx    Past Surgical History:  Procedure Laterality Date   BIOPSY BREAST Left 09/22/2018   no maglignancy   CATARACT EXTRACTION Right 05/25/2023   COLONOSCOPY  2009   Dr. Marina Goodell   POLYPECTOMY     Social History   Occupational History   Not on file  Tobacco Use   Smoking status: Former    Current packs/day: 0.25    Average packs/day: 0.3 packs/day for 10.0 years (2.5 ttl pk-yrs)    Types: Cigarettes   Smokeless tobacco: Never   Tobacco comments:    quit oct 2018  Vaping Use   Vaping status: Never Used  Substance and Sexual Activity   Alcohol use: Not Currently    Comment: socially   Drug use: Not Currently    Types: Marijuana    Comment: social   Sexual activity: Not Currently

## 2024-03-02 ENCOUNTER — Encounter: Payer: Self-pay | Admitting: Family Medicine

## 2024-03-02 DIAGNOSIS — Z0000 Encounter for general adult medical examination without abnormal findings: Secondary | ICD-10-CM

## 2024-03-07 ENCOUNTER — Encounter: Payer: Self-pay | Admitting: Physical Therapy

## 2024-03-07 ENCOUNTER — Ambulatory Visit: Admitting: Physical Therapy

## 2024-03-07 ENCOUNTER — Ambulatory Visit: Attending: Medical | Admitting: Physical Therapy

## 2024-03-07 DIAGNOSIS — R2689 Other abnormalities of gait and mobility: Secondary | ICD-10-CM | POA: Insufficient documentation

## 2024-03-07 DIAGNOSIS — M6281 Muscle weakness (generalized): Secondary | ICD-10-CM | POA: Diagnosis not present

## 2024-03-07 DIAGNOSIS — M5459 Other low back pain: Secondary | ICD-10-CM | POA: Insufficient documentation

## 2024-03-07 NOTE — Therapy (Signed)
 OUTPATIENT PHYSICAL THERAPY TREATMENT   Patient Name: [name] MRN: [id number] DOB:[birth year], 68 y.o., female Today's Date: 03/07/2024  END OF SESSION:  PT End of Session - 03/07/24 1543     Visit Number 3    Number of Visits 17    Date for PT Re-Evaluation 04/20/24    PT Start Time 1520    PT Stop Time 1600    PT Time Calculation (min) 40 min               Past Medical History:  Diagnosis Date   Arthritis    Asthma    Colonic polyp    GERD (gastroesophageal reflux disease)    Hypertension    Obesity    Pain in limb    Smoker    Swelling of limb    Past Surgical History:  Procedure Laterality Date   BIOPSY BREAST Left 09/22/2018   no maglignancy   CATARACT EXTRACTION Right 05/25/2023   COLONOSCOPY  2009   Dr. Marina Goodell   POLYPECTOMY     Patient Active Problem List   Diagnosis Date Noted   Myopathy, unspecified 07/15/2023   COPD (chronic obstructive pulmonary disease) (HCC) 11/30/2022   Stage 3a chronic kidney disease (HCC) 01/23/2021   History of cataract surgery 04/27/2020   Multiple somatic complaints 01/10/2019   Adenomatous polyp of colon 05/25/2018   Asthma, chronic, mild persistent, uncomplicated 03/06/2017   Chronic seasonal allergic rhinitis due to pollen 03/06/2017   Gastroesophageal reflux disease without esophagitis 10/10/2014   Ex-cigarette smoker 09/28/2013   Type 2 diabetes mellitus with complications (HCC) 07/12/2012   Obesity (BMI 30-39.9) 07/12/2012   Chronic back pain 07/12/2012   Hypertension associated with diabetes (HCC) 07/12/2012    PCP: Ronnald Nian, MD  REFERRING PROVIDER: Jac Canavan, PA-C   REFERRING DIAG: [phone] (ICD-10-CM) - Chronic low back pain, unspecified back pain laterality, unspecified whether sciatica present   Rationale for Evaluation and Treatment: Rehabilitation  THERAPY DIAG:  Other low back pain  Muscle weakness (generalized)  Other abnormalities of gait and  mobility  ONSET DATE: Chronic  SUBJECTIVE:                                                                                                                                                                                           SUBJECTIVE STATEMENT: Stated that her pain doctors want to do another MRI after having no relief from previous injection on 01/07/2024. Hurts the most when she wakes up, and continues at random throughout the day. Feels like someone is "kicking her in the but."  EVAL: Pt presents to PT  with reports of chronic LBP with refers into posterior LE, R>L. Pt is well known to clinic, has had past success to PT. Denies bowel/bladder changes or saddle anesthesia. Also denies N/T down LE and pain does not go distal to knee. Has had a lot of issues with LE cramping lately, also is frustrated by her inability to walk or stand longer than 10 minutes.   PERTINENT HISTORY:  HTN, DM II, COPD  PAIN:  Are you having pain?  Yes: NPRS scale: 8/10 Pain location: lower back R>L, posterior hip R>L Pain description: sharp, tight, sore Aggravating factors: mornings, prolonged standing, walking Relieving factors: massage, medication  PRECAUTIONS: None  RED FLAGS: None   WEIGHT BEARING RESTRICTIONS: No  FALLS:  Has patient fallen in last 6 months? No  LIVING ENVIRONMENT: Lives with: lives alone Lives in: House/apartment Stairs: Yes: External: 12 steps; on right going up Has following equipment at home: Single point cane and Walker - 2 wheeled  OCCUPATION: Retired - Does [employer] a few times a week  PLOF: Independent  PATIENT GOALS: pt wants to decrease pain in her lower back to improve comfort with walking and shopping  NEXT MD VISIT: 03/01/2024  OBJECTIVE:  Note: Objective measures were completed at Evaluation unless otherwise noted.  DIAGNOSTIC FINDINGS:  See imaging  PATIENT SURVEYS:  ODI: 74% disability (37/50)  COGNITION: Overall cognitive  status: Within functional limits for tasks assessed     SENSATION: [facility]  MUSCLE LENGTH: Thomas test: Right (+); Left (+)  POSTURE: rounded shoulders, forward head, and increased lumbar lordosis  PALPATION: TTP to bilateral lumbar paraspinals  LUMBAR ROM:   AROM eval  Flexion 50%  Extension 25%  Right lateral flexion   Left lateral flexion   Right rotation   Left rotation    (Blank rows = not tested)  LOWER EXTREMITY MMT:    MMT Right eval Left eval  Hip flexion 3+/5 3+/5  Hip extension    Hip abduction 3+/5 3+/5  Hip adduction    Hip internal rotation    Hip external rotation    Knee flexion 4/5 4/5  Knee extension 4/5 4/5  Ankle dorsiflexion    Ankle plantarflexion    Ankle inversion    Ankle eversion     (Blank rows = not tested)  LUMBAR SPECIAL TESTS:  Straight leg raise test: Negative and Slump test: Negative  FUNCTIONAL TESTS:  30 Second Sit to Stand: 8 reps  GAIT: Distance walked: 61ft Assistive device utilized: Single point cane Level of assistance: SBA Comments: trunk flexed, decreased gait speed  TREATMENT: [facility] Adult PT Treatment:                                                DATE: 03/07/2024 Therapeutic exercise NuStep 5' lvl 5'  Seated Hamstring stretch w/APT 2x1'  Therapeutic Activity: Seated Flexion row w/ GTB and dowel 2x12, 5s hold Supine bridge w/ PPT 2x10, 3s hold Pt education on DME use and modification using tennis balls for FWW   [facility] Adult PT Treatment:                                                DATE: 02/29/2024 Therapeutic Exercise: Seated hamstring  stretch 2x30" each PPT x 10 - 5" hold PPT with ball x 10 - 5" hold LTR x 10 Seated clamshell 2x15 blue band Repeated flexion in sitting x 15 with physioball Seated fig 4 stretch 2x30" each LAQ 2x10 2.5# Therapeutic Activity:  NuStep lvl 5 UE/LE x 5 min for improving functional activity tolerance STS 2x10  PATIENT EDUCATION:  Education details: eval findings, ODI,  HEP, POC Person educated: Patient Education method: Explanation, Demonstration, and Handouts Education comprehension: verbalized understanding and returned demonstration  HOME EXERCISE PROGRAM: Access Code: [email or web address] URL: [email or web address] Date: 02/23/2024 Prepared by: Edwinna Areola  Exercises - Supine Lower Trunk Rotation  - 1-2 x daily - 7 x weekly - 2 sets - 10 reps - Supine Posterior Pelvic Tilt  - 1-2 x daily - 7 x weekly - 2 sets - 10 reps - 5 sec hold - Modified Thomas Stretch  - 1-2 x daily - 7 x weekly - 2 reps - 30 sec hold - Seated Figure 4 Piriformis Stretch  - 1-2 x daily - 7 x weekly - 2 reps - 30 sec hold - Sit to Stand  - 1-2 x daily - 7 x weekly - 3 sets - 10 reps - Seated Hamstring Stretch  - 1-2 x daily - 7 x weekly - 2 reps - 30 sec hold  ASSESSMENT:  CLINICAL IMPRESSION: Pt attended physical therapy session for continuation of treatment regarding low back pain w/ a "cramping" sensation in posterior thigh. Today's treatment focused on improvement of  hamstring length,posterior chain strength, dynamic core stability, and activity tolerance. Pt showed good tolerance to treatment however demonstrated minimal change in symptoms throughout session.  Pt required moderate verbal cuing alongside minimal physical assistance for safe and appropriate performance of today's activities. Pt demonstrated on LOB during ambulation to treatment area requiring min assistance for recovery. Continue with therapeutic focus on Hamstring strength in full ROM, global BLE strength, dynamic core stability, an hip/lumbar mobility. Pt requires more time allotted for activity performance.  EVAL: Patient is a 68 y.o. F who was seen today for physical therapy evaluation and treatment for chronic lower back pain and discomfort that radiates into bilateral LE;. Physical findings are consistent with referring provider impression as pt demonstrates decrease in core and hip strength as well as  sharp decline in functional mobility. Oswestry score indicates severe disability in performance of home ADLs and higher level community activities. Pt would benefit from skilled PT services working on improving core/hip strength in order to decrease pain.    OBJECTIVE IMPAIRMENTS: Abnormal gait, decreased activity tolerance, decreased balance, decreased endurance, decreased mobility, difficulty walking, decreased ROM, decreased strength, improper body mechanics, and pain   ACTIVITY LIMITATIONS: carrying, lifting, sitting, standing, squatting, stairs, transfers, reach over head, and locomotion level  PARTICIPATION LIMITATIONS: meal prep, cleaning, driving, shopping, community activity, occupation, and yard work  PERSONAL FACTORS: Time since onset of injury/illness/exacerbation and 3+ comorbidities: HTN, DM II, COPD  are also affecting patient's functional outcome.   REHAB POTENTIAL: Good  CLINICAL DECISION MAKING: Evolving/moderate complexity  EVALUATION COMPLEXITY: Moderate   GOALS: Goals reviewed with patient? No  SHORT TERM GOALS: Target date: 03/15/2024   Pt will be compliant and knowledgeable with initial HEP for improved comfort and carryover Baseline: initial HEP given  Goal status: INITIAL  2.  Pt will self report low back and LE pain no greater than 7/10 for improved comfort and functional ability Baseline: 10/10 at worst Goal status: INITIAL   LONG TERM GOALS:  Target date: 04/20/2024   Pt will be decrease ODI disability score to no greater than 50% (25/50) as proxy for functional improvement with home ADLs and community activites Baseline: 74% disability (37/50) Goal status: INITIAL  2.  Pt will self report low back and LE pain no greater than 3/10 for improved comfort and functional ability Baseline: 10/10 at worst Goal status: INITIAL   3.  Pt will increase 30 Second Sit to Stand rep count to no less than 10 reps for improved balance, strength, and functional  mobility Baseline: 8 reps  Goal status: INITIAL   4.  Pt will be able to improve standing and walking activity tolerance to at least 20 minutes for improved functional ability with community navigation Baseline: 10 minutes Goal status: INITIAL   PLAN:  PT FREQUENCY: 2x/week  PT DURATION: 8 weeks  PLANNED INTERVENTIONS: 97164- PT Re-evaluation, 97110-Therapeutic exercises, 97530- Therapeutic activity, [id number]- Neuromuscular re-education, 97535- Self Care, 96045- Manual therapy, [id number]- Aquatic Therapy, [id number]- Electrical stimulation (unattended), [id number]- Electrical stimulation (manual), Dry Needling, Cryotherapy, and Moist heat.  PLAN FOR NEXT SESSION: Continue with therapeutic focus on Hamstring strength in full ROM, global BLE strength, dynamic core stability, an hip/lumbar mobility.   Luis Abed, PT 03/07/2024, 4:28 PM

## 2024-03-09 ENCOUNTER — Encounter: Payer: Self-pay | Admitting: Physical Therapy

## 2024-03-09 ENCOUNTER — Ambulatory Visit: Admitting: Physical Therapy

## 2024-03-09 DIAGNOSIS — M6281 Muscle weakness (generalized): Secondary | ICD-10-CM | POA: Diagnosis not present

## 2024-03-09 DIAGNOSIS — R2689 Other abnormalities of gait and mobility: Secondary | ICD-10-CM

## 2024-03-09 DIAGNOSIS — M5459 Other low back pain: Secondary | ICD-10-CM

## 2024-03-09 NOTE — Therapy (Signed)
 OUTPATIENT PHYSICAL THERAPY TREATMENT   Patient Name: [name] MRN: [id number] DOB:1955/12/11, 68 y.o., female Today's Date: 03/09/2024  END OF SESSION:  PT End of Session - 03/09/24 1453     Visit Number 4    Number of Visits 17    Date for PT Re-Evaluation 04/20/24    Authorization Type [employer] Dual Complete    PT Start Time 0250    PT Stop Time 0328    PT Time Calculation (min) 38 min               Past Medical History:  Diagnosis Date   Arthritis    Asthma    Colonic polyp    GERD (gastroesophageal reflux disease)    Hypertension    Obesity    Pain in limb    Smoker    Swelling of limb    Past Surgical History:  Procedure Laterality Date   BIOPSY BREAST Left 09/22/2018   no maglignancy   CATARACT EXTRACTION Right 05/25/2023   COLONOSCOPY  2009   Dr. Marina Goodell   POLYPECTOMY     Patient Active Problem List   Diagnosis Date Noted   Myopathy, unspecified 07/15/2023   COPD (chronic obstructive pulmonary disease) (HCC) 11/30/2022   Stage 3a chronic kidney disease (HCC) 01/23/2021   History of cataract surgery 04/27/2020   Multiple somatic complaints 01/10/2019   Adenomatous polyp of colon 05/25/2018   Asthma, chronic, mild persistent, uncomplicated 03/06/2017   Chronic seasonal allergic rhinitis due to pollen 03/06/2017   Gastroesophageal reflux disease without esophagitis 10/10/2014   Ex-cigarette smoker 09/28/2013   Type 2 diabetes mellitus with complications (HCC) 07/12/2012   Obesity (BMI 30-39.9) 07/12/2012   Chronic back pain 07/12/2012   Hypertension associated with diabetes (HCC) 07/12/2012    PCP: Ronnald Nian, MD  REFERRING PROVIDER: Jac Canavan, PA-C   REFERRING DIAG: [phone] (ICD-10-CM) - Chronic low back pain, unspecified back pain laterality, unspecified whether sciatica present   Rationale for Evaluation and Treatment: Rehabilitation  THERAPY DIAG:  Other low back pain  Muscle weakness (generalized)  Other  abnormalities of gait and mobility  ONSET DATE: Chronic  SUBJECTIVE:                                                                                                                                                                                           SUBJECTIVE STATEMENT: Pt reports the tennis ball is helping the pain in buttock.    Stated that her pain doctors want to do another MRI after having no relief from previous injection on 01/07/2024. Hurts the most when she wakes up,  and continues at random throughout the day. Feels like someone is "kicking her in the but."  EVAL: Pt presents to PT with reports of chronic LBP with refers into posterior LE, R>L. Pt is well known to clinic, has had past success to PT. Denies bowel/bladder changes or saddle anesthesia. Also denies N/T down LE and pain does not go distal to knee. Has had a lot of issues with LE cramping lately, also is frustrated by her inability to walk or stand longer than 10 minutes.   PERTINENT HISTORY:  HTN, DM II, COPD  PAIN:  Are you having pain?  Yes: NPRS scale: 7/10 Pain location: lower back R>L, posterior hip R>L Pain description: sharp, tight, sore Aggravating factors: mornings, prolonged standing, walking Relieving factors: massage, medication  PRECAUTIONS: None  RED FLAGS: None   WEIGHT BEARING RESTRICTIONS: No  FALLS:  Has patient fallen in last 6 months? No  LIVING ENVIRONMENT: Lives with: lives alone Lives in: House/apartment Stairs: Yes: External: 12 steps; on right going up Has following equipment at home: Single point cane and Walker - 2 wheeled  OCCUPATION: Retired - Does [employer] a few times a week  PLOF: Independent  PATIENT GOALS: pt wants to decrease pain in her lower back to improve comfort with walking and shopping  NEXT MD VISIT: 03/01/2024  OBJECTIVE:  Note: Objective measures were completed at Evaluation unless otherwise noted.  DIAGNOSTIC FINDINGS:  See  imaging  PATIENT SURVEYS:  ODI: 74% disability (37/50)  COGNITION: Overall cognitive status: Within functional limits for tasks assessed     SENSATION: [facility]  MUSCLE LENGTH: Thomas test: Right (+); Left (+)  POSTURE: rounded shoulders, forward head, and increased lumbar lordosis  PALPATION: TTP to bilateral lumbar paraspinals  LUMBAR ROM:   AROM eval  Flexion 50%  Extension 25%  Right lateral flexion   Left lateral flexion   Right rotation   Left rotation    (Blank rows = not tested)  LOWER EXTREMITY MMT:    MMT Right eval Left eval  Hip flexion 3+/5 3+/5  Hip extension    Hip abduction 3+/5 3+/5  Hip adduction    Hip internal rotation    Hip external rotation    Knee flexion 4/5 4/5  Knee extension 4/5 4/5  Ankle dorsiflexion    Ankle plantarflexion    Ankle inversion    Ankle eversion     (Blank rows = not tested)  LUMBAR SPECIAL TESTS:  Straight leg raise test: Negative and Slump test: Negative  FUNCTIONAL TESTS:  30 Second Sit to Stand: 8 reps  GAIT: Distance walked: 83ft Assistive device utilized: Single point cane Level of assistance: SBA Comments: trunk flexed, decreased gait speed  TREATMENT: [facility] Adult PT Treatment:                                                DATE: 03/09/24 Therapeutic Exercise: Seated fig 4 pull and push -tennis ball under hip, bilat  Seated hamstring stretches  x 3 each  Supine with feet on ball , h/s curls  Supine with feet over ball , mini bridge  Supine marching- increased back pain Standing march with UE support      [facility] Adult PT Treatment:  DATE: 03/07/2024 Therapeutic exercise NuStep 5' lvl 5'  Seated Hamstring stretch w/APT 2x1'  Therapeutic Activity: Seated Flexion row w/ GTB and dowel 2x12, 5s hold Supine bridge w/ PPT 2x10, 3s hold Pt education on DME use and modification using tennis balls for FWW   [facility] Adult PT Treatment:                                                 DATE: 02/29/2024 Therapeutic Exercise: Seated hamstring stretch 2x30" each PPT x 10 - 5" hold PPT with ball x 10 - 5" hold LTR x 10 Seated clamshell 2x15 blue band Repeated flexion in sitting x 15 with physioball Seated fig 4 stretch 2x30" each LAQ 2x10 2.5# Therapeutic Activity:  NuStep lvl 5 UE/LE x 5 min for improving functional activity tolerance STS 2x10  PATIENT EDUCATION:  Education details: eval findings, ODI, HEP, POC Person educated: Patient Education method: Explanation, Demonstration, and Handouts Education comprehension: verbalized understanding and returned demonstration  HOME EXERCISE PROGRAM: Access Code: [email or web address] URL: [email or web address] Date: 02/23/2024 Prepared by: Edwinna Areola  Exercises - Supine Lower Trunk Rotation  - 1-2 x daily - 7 x weekly - 2 sets - 10 reps - Supine Posterior Pelvic Tilt  - 1-2 x daily - 7 x weekly - 2 sets - 10 reps - 5 sec hold - Modified Thomas Stretch  - 1-2 x daily - 7 x weekly - 2 reps - 30 sec hold - Seated Figure 4 Piriformis Stretch  - 1-2 x daily - 7 x weekly - 2 reps - 30 sec hold - Sit to Stand  - 1-2 x daily - 7 x weekly - 3 sets - 10 reps - Seated Hamstring Stretch  - 1-2 x daily - 7 x weekly - 2 reps - 30 sec hold  ASSESSMENT:  CLINICAL IMPRESSION: Pt attended physical therapy session for continuation of treatment regarding low back pain with bilateral buttock pain. Reports tennis ball for self TPR has been helpful. She also notes decreased feeling of tension and hardness in hamstrings.  Today's treatment focused on improvement of  hamstring length,posterior chain strength, dynamic core stability, and activity tolerance.  Continue with therapeutic focus on Hamstring strength in full ROM, global BLE strength, dynamic core stability, an hip/lumbar mobility. Pt requires more time allotted for activity performance.  EVAL: Patient is a 68 y.o. F who was seen today for physical therapy  evaluation and treatment for chronic lower back pain and discomfort that radiates into bilateral LE;. Physical findings are consistent with referring provider impression as pt demonstrates decrease in core and hip strength as well as sharp decline in functional mobility. Oswestry score indicates severe disability in performance of home ADLs and higher level community activities. Pt would benefit from skilled PT services working on improving core/hip strength in order to decrease pain.    OBJECTIVE IMPAIRMENTS: Abnormal gait, decreased activity tolerance, decreased balance, decreased endurance, decreased mobility, difficulty walking, decreased ROM, decreased strength, improper body mechanics, and pain   ACTIVITY LIMITATIONS: carrying, lifting, sitting, standing, squatting, stairs, transfers, reach over head, and locomotion level  PARTICIPATION LIMITATIONS: meal prep, cleaning, driving, shopping, community activity, occupation, and yard work  PERSONAL FACTORS: Time since onset of injury/illness/exacerbation and 3+ comorbidities: HTN, DM II, COPD  are also affecting patient's functional outcome.   REHAB POTENTIAL: Good  CLINICAL DECISION MAKING: Evolving/moderate  complexity  EVALUATION COMPLEXITY: Moderate   GOALS: Goals reviewed with patient? No  SHORT TERM GOALS: Target date: 03/15/2024   Pt will be compliant and knowledgeable with initial HEP for improved comfort and carryover Baseline: initial HEP given  Goal status: INITIAL  2.  Pt will self report low back and LE pain no greater than 7/10 for improved comfort and functional ability Baseline: 10/10 at worst Goal status: INITIAL   LONG TERM GOALS: Target date: 04/20/2024   Pt will be decrease ODI disability score to no greater than 50% (25/50) as proxy for functional improvement with home ADLs and community activites Baseline: 74% disability (37/50) Goal status: INITIAL  2.  Pt will self report low back and LE pain no greater than  3/10 for improved comfort and functional ability Baseline: 10/10 at worst Goal status: INITIAL   3.  Pt will increase 30 Second Sit to Stand rep count to no less than 10 reps for improved balance, strength, and functional mobility Baseline: 8 reps  Goal status: INITIAL   4.  Pt will be able to improve standing and walking activity tolerance to at least 20 minutes for improved functional ability with community navigation Baseline: 10 minutes Goal status: INITIAL   PLAN:  PT FREQUENCY: 2x/week  PT DURATION: 8 weeks  PLANNED INTERVENTIONS: 97164- PT Re-evaluation, 97110-Therapeutic exercises, 97530- Therapeutic activity, [id number]- Neuromuscular re-education, 97535- Self Care, [id number]- Manual therapy, [id number]- Aquatic Therapy, G4010- Electrical stimulation (unattended), [id number]- Electrical stimulation (manual), Dry Needling, Cryotherapy, and Moist heat.  PLAN FOR NEXT SESSION: Continue with therapeutic focus on Hamstring strength in full ROM, global BLE strength, dynamic core stability, an hip/lumbar mobility.   Jannette Spanner, PTA 03/09/24 3:39 PM Phone: [phone] Fax: [phone]

## 2024-03-11 ENCOUNTER — Ambulatory Visit
Admission: RE | Admit: 2024-03-11 | Discharge: 2024-03-11 | Disposition: A | Discharge status: Home / Self Care | Source: Ambulatory Visit | Attending: Physical Medicine and Rehabilitation | Admitting: Physical Medicine and Rehabilitation

## 2024-03-11 DIAGNOSIS — M48061 Spinal stenosis, lumbar region without neurogenic claudication: Secondary | ICD-10-CM

## 2024-03-11 DIAGNOSIS — M47816 Spondylosis without myelopathy or radiculopathy, lumbar region: Secondary | ICD-10-CM | POA: Diagnosis not present

## 2024-03-11 DIAGNOSIS — M5126 Other intervertebral disc displacement, lumbar region: Secondary | ICD-10-CM | POA: Diagnosis not present

## 2024-03-11 DIAGNOSIS — G8929 Other chronic pain: Secondary | ICD-10-CM

## 2024-03-11 DIAGNOSIS — M5416 Radiculopathy, lumbar region: Secondary | ICD-10-CM

## 2024-03-14 ENCOUNTER — Encounter: Payer: Self-pay | Admitting: Physical Therapy

## 2024-03-14 ENCOUNTER — Ambulatory Visit: Admitting: Physical Therapy

## 2024-03-14 DIAGNOSIS — M6281 Muscle weakness (generalized): Secondary | ICD-10-CM

## 2024-03-14 DIAGNOSIS — R2689 Other abnormalities of gait and mobility: Secondary | ICD-10-CM

## 2024-03-14 DIAGNOSIS — M5459 Other low back pain: Secondary | ICD-10-CM | POA: Diagnosis not present

## 2024-03-14 NOTE — Therapy (Signed)
 OUTPATIENT PHYSICAL THERAPY TREATMENT   Patient Name: [name] MRN: [id number] DOB:August 03, 1956, 68 y.o., female Today's Date: 03/14/2024  END OF SESSION:  PT End of Session - 03/14/24 1442     Visit Number 5    Number of Visits 17    Date for PT Re-Evaluation 04/20/24    Authorization Type [employer] Dual Complete    PT Start Time 0242    PT Stop Time 0330    PT Time Calculation (min) 48 min               Past Medical History:  Diagnosis Date   Arthritis    Asthma    Colonic polyp    GERD (gastroesophageal reflux disease)    Hypertension    Obesity    Pain in limb    Smoker    Swelling of limb    Past Surgical History:  Procedure Laterality Date   BIOPSY BREAST Left 09/22/2018   no maglignancy   CATARACT EXTRACTION Right 05/25/2023   COLONOSCOPY  2009   Dr. Elvin Hammer   POLYPECTOMY     Patient Active Problem List   Diagnosis Date Noted   Myopathy, unspecified 07/15/2023   COPD (chronic obstructive pulmonary disease) (HCC) 11/30/2022   Stage 3a chronic kidney disease (HCC) 01/23/2021   History of cataract surgery 04/27/2020   Multiple somatic complaints 01/10/2019   Adenomatous polyp of colon 05/25/2018   Asthma, chronic, mild persistent, uncomplicated 03/06/2017   Chronic seasonal allergic rhinitis due to pollen 03/06/2017   Gastroesophageal reflux disease without esophagitis 10/10/2014   Ex-cigarette smoker 09/28/2013   Type 2 diabetes mellitus with complications (HCC) 07/12/2012   Obesity (BMI 30-39.9) 07/12/2012   Chronic back pain 07/12/2012   Hypertension associated with diabetes (HCC) 07/12/2012    PCP: Watson Hacking, MD  REFERRING PROVIDER: Claudene Crystal, PA-C   REFERRING DIAG: [phone] (ICD-10-CM) - Chronic low back pain, unspecified back pain laterality, unspecified whether sciatica present   Rationale for Evaluation and Treatment: Rehabilitation  THERAPY DIAG:  Other low back pain  Muscle weakness (generalized)  Other  abnormalities of gait and mobility  ONSET DATE: Chronic  SUBJECTIVE:                                                                                                                                                                                           SUBJECTIVE STATEMENT: My pain meds have not kicked in. My legs feel like they will give out.    Stated that her pain doctors want to do another MRI after having no relief from previous injection on 01/07/2024. Hurts the most  when she wakes up, and continues at random throughout the day. Feels like someone is "kicking her in the but."  EVAL: Pt presents to PT with reports of chronic LBP with refers into posterior LE, R>L. Pt is well known to clinic, has had past success to PT. Denies bowel/bladder changes or saddle anesthesia. Also denies N/T down LE and pain does not go distal to knee. Has had a lot of issues with LE cramping lately, also is frustrated by her inability to walk or stand longer than 10 minutes.   PERTINENT HISTORY:  HTN, DM II, COPD  PAIN:  Are you having pain?  Yes: NPRS scale: 9/10 Pain location: lower back R>L, posterior hip R>L Pain description: sharp, tight, sore Aggravating factors: mornings, prolonged standing, walking Relieving factors: massage, medication  PRECAUTIONS: None  RED FLAGS: None   WEIGHT BEARING RESTRICTIONS: No  FALLS:  Has patient fallen in last 6 months? No  LIVING ENVIRONMENT: Lives with: lives alone Lives in: House/apartment Stairs: Yes: External: 12 steps; on right going up Has following equipment at home: Single point cane and Walker - 2 wheeled  OCCUPATION: Retired - Does [employer] a few times a week  PLOF: Independent  PATIENT GOALS: pt wants to decrease pain in her lower back to improve comfort with walking and shopping  NEXT MD VISIT: 03/01/2024  OBJECTIVE:  Note: Objective measures were completed at Evaluation unless otherwise noted.  DIAGNOSTIC FINDINGS:   See imaging  PATIENT SURVEYS:  ODI: 74% disability (37/50)  COGNITION: Overall cognitive status: Within functional limits for tasks assessed     SENSATION: [facility]  MUSCLE LENGTH: Thomas test: Right (+); Left (+)  POSTURE: rounded shoulders, forward head, and increased lumbar lordosis  PALPATION: TTP to bilateral lumbar paraspinals  LUMBAR ROM:   AROM eval  Flexion 50%  Extension 25%  Right lateral flexion   Left lateral flexion   Right rotation   Left rotation    (Blank rows = not tested)  LOWER EXTREMITY MMT:    MMT Right eval Left eval  Hip flexion 3+/5 3+/5  Hip extension    Hip abduction 3+/5 3+/5  Hip adduction    Hip internal rotation    Hip external rotation    Knee flexion 4/5 4/5  Knee extension 4/5 4/5  Ankle dorsiflexion    Ankle plantarflexion    Ankle inversion    Ankle eversion     (Blank rows = not tested)  LUMBAR SPECIAL TESTS:  Straight leg raise test: Negative and Slump test: Negative  FUNCTIONAL TESTS:  30 Second Sit to Stand: 8 reps 03/14/24: 30 sec standing test 6 reps   GAIT: Distance walked: 55ft Assistive device utilized: Single point cane Level of assistance: SBA Comments: trunk flexed, decreased gait speed  TREATMENT: [facility] Adult PT Treatment:                                                DATE: 03/14/24 Therapeutic Exercise: Seated hamstring stretch SLR 2 x 5 each  LTR SKTC Supine h/s stretch with strap x 2 each   Therapeutic Activity: Nustep L5 UE/LE x 5 minutes  Repeated lumbar flexion with ball  STS x 10 without UE  Modalities: Estim: IFC x 10 minutes low lumbar seated  HMP x 10 minutes      [facility] Adult PT Treatment:  DATE: 03/09/24 Therapeutic Exercise: Seated fig 4 pull and push -tennis ball under hip, bilat  Seated hamstring stretches  x 3 each  Supine with feet on ball , h/s curls  Supine with feet over ball , mini bridge  Supine marching- increased back  pain Standing march with UE support      [facility] Adult PT Treatment:                                                DATE: 03/07/2024 Therapeutic exercise NuStep 5' lvl 5'  Seated Hamstring stretch w/APT 2x1'  Therapeutic Activity: Seated Flexion row w/ GTB and dowel 2x12, 5s hold Supine bridge w/ PPT 2x10, 3s hold Pt education on DME use and modification using tennis balls for FWW   [facility] Adult PT Treatment:                                                DATE: 02/29/2024 Therapeutic Exercise: Seated hamstring stretch 2x30" each PPT x 10 - 5" hold PPT with ball x 10 - 5" hold LTR x 10 Seated clamshell 2x15 blue band Repeated flexion in sitting x 15 with physioball Seated fig 4 stretch 2x30" each LAQ 2x10 2.5# Therapeutic Activity:  NuStep lvl 5 UE/LE x 5 min for improving functional activity tolerance STS 2x10  PATIENT EDUCATION:  Education details: eval findings, ODI, HEP, POC Person educated: Patient Education method: Explanation, Demonstration, and Handouts Education comprehension: verbalized understanding and returned demonstration  HOME EXERCISE PROGRAM: Access Code: [email or web address] URL: [email or web address] Date: 02/23/2024 Prepared by: Edwinna Areola  Exercises - Supine Lower Trunk Rotation  - 1-2 x daily - 7 x weekly - 2 sets - 10 reps - Supine Posterior Pelvic Tilt  - 1-2 x daily - 7 x weekly - 2 sets - 10 reps - 5 sec hold - Modified Thomas Stretch  - 1-2 x daily - 7 x weekly - 2 reps - 30 sec hold - Seated Figure 4 Piriformis Stretch  - 1-2 x daily - 7 x weekly - 2 reps - 30 sec hold - Sit to Stand  - 1-2 x daily - 7 x weekly - 3 sets - 10 reps - Seated Hamstring Stretch  - 1-2 x daily - 7 x weekly - 2 reps - 30 sec hold  ASSESSMENT:  CLINICAL IMPRESSION: Pt attended physical therapy session for continuation of treatment regarding low back pain with bilateral buttock pain. Continues to use tennis ball for relief of buttock pain. Today her lumbar pain  increased during supine therex  and pt requested Estim as she has had a positive response to this in previous therapy episodes. She reports reduced lumbar pain. She was given TENS handout and wants to try the estim on her glutes and upper hamstrings.  Continue with therapeutic focus on Hamstring strength in full ROM, global BLE strength, dynamic core stability, an hip/lumbar mobility. Pt requires more time allotted for activity performance.  EVAL: Patient is a 68 y.o. F who was seen today for physical therapy evaluation and treatment for chronic lower back pain and discomfort that radiates into bilateral LE;. Physical findings are consistent with referring provider impression as pt demonstrates decrease in core and hip strength as well  as sharp decline in functional mobility. Oswestry score indicates severe disability in performance of home ADLs and higher level community activities. Pt would benefit from skilled PT services working on improving core/hip strength in order to decrease pain.    OBJECTIVE IMPAIRMENTS: Abnormal gait, decreased activity tolerance, decreased balance, decreased endurance, decreased mobility, difficulty walking, decreased ROM, decreased strength, improper body mechanics, and pain   ACTIVITY LIMITATIONS: carrying, lifting, sitting, standing, squatting, stairs, transfers, reach over head, and locomotion level  PARTICIPATION LIMITATIONS: meal prep, cleaning, driving, shopping, community activity, occupation, and yard work  PERSONAL FACTORS: Time since onset of injury/illness/exacerbation and 3+ comorbidities: HTN, DM II, COPD  are also affecting patient's functional outcome.   REHAB POTENTIAL: Good  CLINICAL DECISION MAKING: Evolving/moderate complexity  EVALUATION COMPLEXITY: Moderate   GOALS: Goals reviewed with patient? No  SHORT TERM GOALS: Target date: 03/15/2024   Pt will be compliant and knowledgeable with initial HEP for improved comfort and carryover Baseline:  initial HEP given  Goal status: INITIAL  2.  Pt will self report low back and LE pain no greater than 7/10 for improved comfort and functional ability Baseline: 10/10 at worst Goal status: INITIAL   LONG TERM GOALS: Target date: 04/20/2024   Pt will be decrease ODI disability score to no greater than 50% (25/50) as proxy for functional improvement with home ADLs and community activites Baseline: 74% disability (37/50) Goal status: INITIAL  2.  Pt will self report low back and LE pain no greater than 3/10 for improved comfort and functional ability Baseline: 10/10 at worst Goal status: INITIAL   3.  Pt will increase 30 Second Sit to Stand rep count to no less than 10 reps for improved balance, strength, and functional mobility Baseline: 8 reps  Goal status: INITIAL   4.  Pt will be able to improve standing and walking activity tolerance to at least 20 minutes for improved functional ability with community navigation Baseline: 10 minutes Goal status: INITIAL   PLAN:  PT FREQUENCY: 2x/week  PT DURATION: 8 weeks  PLANNED INTERVENTIONS: 97164- PT Re-evaluation, 97110-Therapeutic exercises, 97530- Therapeutic activity, [id number]- Neuromuscular re-education, 97535- Self Care, 78469- Manual therapy, [id number]- Aquatic Therapy, [id number]- Electrical stimulation (unattended), [id number]- Electrical stimulation (manual), Dry Needling, Cryotherapy, and Moist heat.  PLAN FOR NEXT SESSION: Continue with therapeutic focus on Hamstring strength in full ROM, global BLE strength, dynamic core stability, an hip/lumbar mobility.   Gasper Karst, PTA 03/14/24 3:29 PM Phone: [phone] Fax: [phone]

## 2024-03-16 ENCOUNTER — Ambulatory Visit

## 2024-03-18 ENCOUNTER — Other Ambulatory Visit: Payer: Self-pay | Admitting: Family Medicine

## 2024-03-18 DIAGNOSIS — J441 Chronic obstructive pulmonary disease with (acute) exacerbation: Secondary | ICD-10-CM

## 2024-03-21 ENCOUNTER — Ambulatory Visit (INDEPENDENT_AMBULATORY_CARE_PROVIDER_SITE_OTHER): Admitting: Family Medicine

## 2024-03-21 ENCOUNTER — Telehealth: Payer: Self-pay

## 2024-03-21 VITALS — BP 120/72 | HR 75 | Wt 223.4 lb

## 2024-03-21 DIAGNOSIS — J4530 Mild persistent asthma, uncomplicated: Secondary | ICD-10-CM | POA: Diagnosis not present

## 2024-03-21 DIAGNOSIS — Z23 Encounter for immunization: Secondary | ICD-10-CM

## 2024-03-21 DIAGNOSIS — K219 Gastro-esophageal reflux disease without esophagitis: Secondary | ICD-10-CM

## 2024-03-21 DIAGNOSIS — G8929 Other chronic pain: Secondary | ICD-10-CM | POA: Diagnosis not present

## 2024-03-21 DIAGNOSIS — N1831 Chronic kidney disease, stage 3a: Secondary | ICD-10-CM | POA: Diagnosis not present

## 2024-03-21 DIAGNOSIS — E669 Obesity, unspecified: Secondary | ICD-10-CM | POA: Diagnosis not present

## 2024-03-21 DIAGNOSIS — J301 Allergic rhinitis due to pollen: Secondary | ICD-10-CM

## 2024-03-21 DIAGNOSIS — I152 Hypertension secondary to endocrine disorders: Secondary | ICD-10-CM | POA: Diagnosis not present

## 2024-03-21 DIAGNOSIS — E118 Type 2 diabetes mellitus with unspecified complications: Secondary | ICD-10-CM | POA: Diagnosis not present

## 2024-03-21 DIAGNOSIS — M5441 Lumbago with sciatica, right side: Secondary | ICD-10-CM

## 2024-03-21 DIAGNOSIS — E1159 Type 2 diabetes mellitus with other circulatory complications: Secondary | ICD-10-CM | POA: Diagnosis not present

## 2024-03-21 DIAGNOSIS — D126 Benign neoplasm of colon, unspecified: Secondary | ICD-10-CM

## 2024-03-21 LAB — POCT GLYCOSYLATED HEMOGLOBIN (HGB A1C): Hemoglobin A1C: 7.4 % — AB (ref 4.0–5.6)

## 2024-03-21 MED ORDER — ONDANSETRON 4 MG PO TBDP: 4.0000 mg | ORAL_TABLET | Freq: Three times a day (TID) | ORAL | 0 refills | Status: DC | PRN

## 2024-03-21 NOTE — Telephone Encounter (Signed)
 Pt asked at checkout if you would send medicine for nausea when taking mounjaro shot, she thinks it is zofran  to [facility]

## 2024-03-21 NOTE — Patient Instructions (Signed)
 I taken the metformin  again and if it causes trouble let me know.

## 2024-03-21 NOTE — Progress Notes (Signed)
   Subjective:    Patient ID: [name], female    DOB: 11/11/1956, 68 y.o.   MRN: [id number]  HPI She is here for an interval evaluation.  She is now taking Mounjaro 5 mg and admits to not taking the metformin  on a regular basis.  She thinks that that might be causing some of her back pain.  She is in the process of getting back pain further evaluated.  She did have an injection and had an MRI scheduled and that is pending.  She is using ibuprofen  and states that the tramadol  has not worked that well.  She is using Trelegy and does occasionally uses Xopenex  for her asthma.  She is also taking Singulair  for her allergies and asthma.  She does have underlying reflux and does use medication on an every other day basis.  She does have a history of adenomatous colonic polyp but has not followed up with a colonoscopy.  The last one was done in 2019.   Review of Systems     Objective:    Physical Exam Alert and in no distress.  Hemoglobin A1c is 7.4       Assessment & Plan:  Type 2 diabetes mellitus with complications (HCC) - Plan: POCT glycosylated hemoglobin (Hb A1C)  Stage 3a chronic kidney disease (HCC)  Obesity (BMI 30-39.9)  Gastroesophageal reflux disease without esophagitis  Hypertension associated with diabetes (HCC)  Adenomatous polyp of colon, unspecified part of colon - Plan: Ambulatory referral to Gastroenterology  Asthma, chronic, mild persistent, uncomplicated  Chronic seasonal allergic rhinitis due to pollen  Chronic bilateral low back pain with right-sided sciatica  Need for vaccination against Streptococcus pneumoniae Referral was made to get her colonoscopy done.  Encouraged her to take the metformin  again but if she has difficulty with any symptoms she might relate to that to, let me know.  Recommend she get Tdap at the drugstore.  Continue on all of her other medications.

## 2024-03-22 ENCOUNTER — Encounter: Payer: Self-pay | Admitting: Physical Therapy

## 2024-03-22 ENCOUNTER — Ambulatory Visit: Admitting: Physical Therapy

## 2024-03-22 DIAGNOSIS — R2689 Other abnormalities of gait and mobility: Secondary | ICD-10-CM | POA: Diagnosis not present

## 2024-03-22 DIAGNOSIS — M6281 Muscle weakness (generalized): Secondary | ICD-10-CM | POA: Diagnosis not present

## 2024-03-22 DIAGNOSIS — M5459 Other low back pain: Secondary | ICD-10-CM

## 2024-03-22 NOTE — Therapy (Signed)
 OUTPATIENT PHYSICAL THERAPY TREATMENT   Patient Name: [name] MRN: [id number] DOB:02/27/1956, 68 y.o., female Today's Date: 03/22/2024  END OF SESSION:  PT End of Session - 03/22/24 1448     Visit Number 6    Number of Visits 17    Date for PT Re-Evaluation 04/20/24    Authorization Type [employer] Dual Complete    PT Start Time 0245    PT Stop Time 0337    PT Time Calculation (min) 52 min               Past Medical History:  Diagnosis Date   Arthritis    Asthma    Colonic polyp    GERD (gastroesophageal reflux disease)    Hypertension    Obesity    Pain in limb    Smoker    Swelling of limb    Past Surgical History:  Procedure Laterality Date   BIOPSY BREAST Left 09/22/2018   no maglignancy   CATARACT EXTRACTION Right 05/25/2023   COLONOSCOPY  2009   Dr. Elvin Hammer   POLYPECTOMY     Patient Active Problem List   Diagnosis Date Noted   Myopathy, unspecified 07/15/2023   COPD (chronic obstructive pulmonary disease) (HCC) 11/30/2022   Stage 3a chronic kidney disease (HCC) 01/23/2021   History of cataract surgery 04/27/2020   Multiple somatic complaints 01/10/2019   Adenomatous polyp of colon 05/25/2018   Asthma, chronic, mild persistent, uncomplicated 03/06/2017   Chronic seasonal allergic rhinitis due to pollen 03/06/2017   Gastroesophageal reflux disease without esophagitis 10/10/2014   Ex-cigarette smoker 09/28/2013   Type 2 diabetes mellitus with complications (HCC) 07/12/2012   Obesity (BMI 30-39.9) 07/12/2012   Chronic back pain 07/12/2012   Hypertension associated with diabetes (HCC) 07/12/2012    PCP: Watson Hacking, MD  REFERRING PROVIDER: Claudene Crystal, PA-C   REFERRING DIAG: [phone] (ICD-10-CM) - Chronic low back pain, unspecified back pain laterality, unspecified whether sciatica present   Rationale for Evaluation and Treatment: Rehabilitation  THERAPY DIAG:  Other low back pain  Muscle weakness (generalized)  Other  abnormalities of gait and mobility  ONSET DATE: Chronic  SUBJECTIVE:                                                                                                                                                                                           SUBJECTIVE STATEMENT: It still feels like aching and sharp pains in my butt. I want to try the TENS on hip hips and then my hamstrings. I am going to order one.    Stated that her pain doctors want to  do another MRI after having no relief from previous injection on 01/07/2024. Hurts the most when she wakes up, and continues at random throughout the day. Feels like someone is "kicking her in the but."  EVAL: Pt presents to PT with reports of chronic LBP with refers into posterior LE, R>L. Pt is well known to clinic, has had past success to PT. Denies bowel/bladder changes or saddle anesthesia. Also denies N/T down LE and pain does not go distal to knee. Has had a lot of issues with LE cramping lately, also is frustrated by her inability to walk or stand longer than 10 minutes.   PERTINENT HISTORY:  HTN, DM II, COPD  PAIN:  Are you having pain?  Yes: NPRS scale: 9/10 Pain location: lower back R>L, posterior hip R>L Pain description: sharp, tight, sore Aggravating factors: mornings, prolonged standing, walking Relieving factors: massage, medication  PRECAUTIONS: None  RED FLAGS: None   WEIGHT BEARING RESTRICTIONS: No  FALLS:  Has patient fallen in last 6 months? No  LIVING ENVIRONMENT: Lives with: lives alone Lives in: House/apartment Stairs: Yes: External: 12 steps; on right going up Has following equipment at home: Single point cane and Walker - 2 wheeled  OCCUPATION: Retired - Does [employer] a few times a week  PLOF: Independent  PATIENT GOALS: pt wants to decrease pain in her lower back to improve comfort with walking and shopping  NEXT MD VISIT: 03/01/2024  OBJECTIVE:  Note: Objective measures were  completed at Evaluation unless otherwise noted.  DIAGNOSTIC FINDINGS:  See imaging  PATIENT SURVEYS:  ODI: 74% disability (37/50)  COGNITION: Overall cognitive status: Within functional limits for tasks assessed     SENSATION: [facility]  MUSCLE LENGTH: Thomas test: Right (+); Left (+)  POSTURE: rounded shoulders, forward head, and increased lumbar lordosis  PALPATION: TTP to bilateral lumbar paraspinals  LUMBAR ROM:   AROM eval  Flexion 50%  Extension 25%  Right lateral flexion   Left lateral flexion   Right rotation   Left rotation    (Blank rows = not tested)  LOWER EXTREMITY MMT:    MMT Right eval Left eval  Hip flexion 3+/5 3+/5  Hip extension    Hip abduction 3+/5 3+/5  Hip adduction    Hip internal rotation    Hip external rotation    Knee flexion 4/5 4/5  Knee extension 4/5 4/5  Ankle dorsiflexion    Ankle plantarflexion    Ankle inversion    Ankle eversion     (Blank rows = not tested)  LUMBAR SPECIAL TESTS:  Straight leg raise test: Negative and Slump test: Negative  FUNCTIONAL TESTS:  30 Second Sit to Stand: 8 reps 03/14/24: 30 sec standing test 6 reps  03/22/24: 7 reps  GAIT: Distance walked: 11ft Assistive device utilized: Single point cane Level of assistance: SBA Comments: trunk flexed, decreased gait speed  TREATMENT: [facility] Adult PT Treatment:                                                DATE: 03/22/24 Therapeutic Exercise: Seated h/s stretch Seated figure 4 SLR 1 x 10 each  Bridge with legs over ball H/s curls with feet on ball  Side clams x 10 each, reverse clam x 10 each Side hip abdct x 10 each    Therapeutic Activity: Repeated lumbar flexion with  ball  STS 10 x 2  Nustep L5 x 5 minutes  Modalities: Estim: IFC x 15 premod to tolerance low back and lateral hips  HMP 15 min seated lumbar concurrent with estim      [facility] Adult PT Treatment:                                                DATE: 03/14/24 Therapeutic  Exercise: Seated hamstring stretch SLR 2 x 5 each  LTR SKTC Supine h/s stretch with strap x 2 each   Therapeutic Activity: Nustep L5 UE/LE x 5 minutes  Repeated lumbar flexion with ball  STS x 10 without UE  Modalities: Estim: IFC x 10 minutes low lumbar seated  HMP x 10 minutes      [facility] Adult PT Treatment:                                                DATE: 03/09/24 Therapeutic Exercise: Seated fig 4 pull and push -tennis ball under hip, bilat  Seated hamstring stretches  x 3 each  Supine with feet on ball , h/s curls  Supine with feet over ball , mini bridge  Supine marching- increased back pain Standing march with UE support      [facility] Adult PT Treatment:                                                DATE: 03/07/2024 Therapeutic exercise NuStep 5' lvl 5'  Seated Hamstring stretch w/APT 2x1'  Therapeutic Activity: Seated Flexion row w/ GTB and dowel 2x12, 5s hold Supine bridge w/ PPT 2x10, 3s hold Pt education on DME use and modification using tennis balls for FWW   [facility] Adult PT Treatment:                                                DATE: 02/29/2024 Therapeutic Exercise: Seated hamstring stretch 2x30" each PPT x 10 - 5" hold PPT with ball x 10 - 5" hold LTR x 10 Seated clamshell 2x15 blue band Repeated flexion in sitting x 15 with physioball Seated fig 4 stretch 2x30" each LAQ 2x10 2.5# Therapeutic Activity:  NuStep lvl 5 UE/LE x 5 min for improving functional activity tolerance STS 2x10  PATIENT EDUCATION:  Education details: eval findings, ODI, HEP, POC Person educated: Patient Education method: Explanation, Demonstration, and Handouts Education comprehension: verbalized understanding and returned demonstration  HOME EXERCISE PROGRAM: Access Code: [email or web address] URL: [email or web address] Date: 02/23/2024 Prepared by: Loral Roch  Exercises - Supine Lower Trunk Rotation  - 1-2 x daily - 7 x weekly - 2 sets - 10 reps - Supine  Posterior Pelvic Tilt  - 1-2 x daily - 7 x weekly - 2 sets - 10 reps - 5 sec hold - Modified Thomas Stretch  - 1-2 x daily - 7 x weekly - 2 reps - 30 sec hold - Seated Figure 4 Piriformis Stretch  - 1-2 x daily -  7 x weekly - 2 reps - 30 sec hold - Sit to Stand  - 1-2 x daily - 7 x weekly - 3 sets - 10 reps - Seated Hamstring Stretch  - 1-2 x daily - 7 x weekly - 2 reps - 30 sec hold  ASSESSMENT:  CLINICAL IMPRESSION: Pt attended physical therapy session for continuation of treatment regarding low back pain with bilateral buttock pain. Continues to use tennis ball for relief of buttock pain. Felt relief with lumbar pain last session using TENS and would like to try her lateral hips today and possibly her upper hamstrings. Continued with core and hip strength with pt reporting fatigue, no increased pain. Trial of TENS on lateral hips, combined with low back and she reported relief at end of session.    EVAL: Patient is a 68 y.o. F who was seen today for physical therapy evaluation and treatment for chronic lower back pain and discomfort that radiates into bilateral LE;. Physical findings are consistent with referring provider impression as pt demonstrates decrease in core and hip strength as well as sharp decline in functional mobility. Oswestry score indicates severe disability in performance of home ADLs and higher level community activities. Pt would benefit from skilled PT services working on improving core/hip strength in order to decrease pain.    OBJECTIVE IMPAIRMENTS: Abnormal gait, decreased activity tolerance, decreased balance, decreased endurance, decreased mobility, difficulty walking, decreased ROM, decreased strength, improper body mechanics, and pain   ACTIVITY LIMITATIONS: carrying, lifting, sitting, standing, squatting, stairs, transfers, reach over head, and locomotion level  PARTICIPATION LIMITATIONS: meal prep, cleaning, driving, shopping, community activity, occupation, and  yard work  PERSONAL FACTORS: Time since onset of injury/illness/exacerbation and 3+ comorbidities: HTN, DM II, COPD  are also affecting patient's functional outcome.   REHAB POTENTIAL: Good  CLINICAL DECISION MAKING: Evolving/moderate complexity  EVALUATION COMPLEXITY: Moderate   GOALS: Goals reviewed with patient? No  SHORT TERM GOALS: Target date: 03/15/2024   Pt will be compliant and knowledgeable with initial HEP for improved comfort and carryover Baseline: initial HEP given  Goal status: INITIAL  2.  Pt will self report low back and LE pain no greater than 7/10 for improved comfort and functional ability Baseline: 10/10 at worst Goal status: INITIAL   LONG TERM GOALS: Target date: 04/20/2024   Pt will be decrease ODI disability score to no greater than 50% (25/50) as proxy for functional improvement with home ADLs and community activites Baseline: 74% disability (37/50) Goal status: INITIAL  2.  Pt will self report low back and LE pain no greater than 3/10 for improved comfort and functional ability Baseline: 10/10 at worst Goal status: INITIAL   3.  Pt will increase 30 Second Sit to Stand rep count to no less than 10 reps for improved balance, strength, and functional mobility Baseline: 8 reps  Goal status: INITIAL   4.  Pt will be able to improve standing and walking activity tolerance to at least 20 minutes for improved functional ability with community navigation Baseline: 10 minutes Goal status: INITIAL   PLAN:  PT FREQUENCY: 2x/week  PT DURATION: 8 weeks  PLANNED INTERVENTIONS: 97164- PT Re-evaluation, 97110-Therapeutic exercises, 97530- Therapeutic activity, [id number]- Neuromuscular re-education, 97535- Self Care, 16109- Manual therapy, [id number]- Aquatic Therapy, U0454- Electrical stimulation (unattended), [id number]- Electrical stimulation (manual), Dry Needling, Cryotherapy, and Moist heat.  PLAN FOR NEXT SESSION: Continue with therapeutic focus on Hamstring  strength in full ROM, global BLE strength, dynamic core stability, an hip/lumbar mobility.  Gasper Karst, PTA 03/22/24 3:49 PM Phone: [phone] Fax: [phone]

## 2024-03-24 ENCOUNTER — Ambulatory Visit

## 2024-03-24 ENCOUNTER — Encounter: Payer: 59 | Admitting: Family Medicine

## 2024-03-24 DIAGNOSIS — M6281 Muscle weakness (generalized): Secondary | ICD-10-CM

## 2024-03-24 DIAGNOSIS — M5459 Other low back pain: Secondary | ICD-10-CM | POA: Diagnosis not present

## 2024-03-24 DIAGNOSIS — R2689 Other abnormalities of gait and mobility: Secondary | ICD-10-CM

## 2024-03-24 NOTE — Therapy (Signed)
 OUTPATIENT PHYSICAL THERAPY TREATMENT   Patient Name: [name] MRN: [id number] DOB:May 18, 1956, 68 y.o., female Today's Date: 03/24/2024  END OF SESSION:  PT End of Session - 03/24/24 1453     Visit Number 7    Number of Visits 17    Date for PT Re-Evaluation 04/20/24    Authorization Type [employer] Dual Complete    PT Start Time 1453   arrived late   PT Stop Time 1520    PT Time Calculation (min) 27 min                Past Medical History:  Diagnosis Date   Arthritis    Asthma    Colonic polyp    GERD (gastroesophageal reflux disease)    Hypertension    Obesity    Pain in limb    Smoker    Swelling of limb    Past Surgical History:  Procedure Laterality Date   BIOPSY BREAST Left 09/22/2018   no maglignancy   CATARACT EXTRACTION Right 05/25/2023   COLONOSCOPY  2009   Dr. Elvin Hammer   POLYPECTOMY     Patient Active Problem List   Diagnosis Date Noted   Myopathy, unspecified 07/15/2023   COPD (chronic obstructive pulmonary disease) (HCC) 11/30/2022   Stage 3a chronic kidney disease (HCC) 01/23/2021   History of cataract surgery 04/27/2020   Multiple somatic complaints 01/10/2019   Adenomatous polyp of colon 05/25/2018   Asthma, chronic, mild persistent, uncomplicated 03/06/2017   Chronic seasonal allergic rhinitis due to pollen 03/06/2017   Gastroesophageal reflux disease without esophagitis 10/10/2014   Ex-cigarette smoker 09/28/2013   Type 2 diabetes mellitus with complications (HCC) 07/12/2012   Obesity (BMI 30-39.9) 07/12/2012   Chronic back pain 07/12/2012   Hypertension associated with diabetes (HCC) 07/12/2012    PCP: Watson Hacking, MD  REFERRING PROVIDER: Claudene Crystal, PA-C   REFERRING DIAG: [phone] (ICD-10-CM) - Chronic low back pain, unspecified back pain laterality, unspecified whether sciatica present   Rationale for Evaluation and Treatment: Rehabilitation  THERAPY DIAG:  Other low back pain  Muscle weakness  (generalized)  Other abnormalities of gait and mobility  ONSET DATE: Chronic  SUBJECTIVE:                                                                                                                                                                                           SUBJECTIVE STATEMENT: Pt presents to PT with continued posterior hip and thigh pain. Has been fairly compliant with HEP.   EVAL: Pt presents to PT with reports of chronic LBP with refers into posterior LE, R>L. Pt  is well known to clinic, has had past success to PT. Denies bowel/bladder changes or saddle anesthesia. Also denies N/T down LE and pain does not go distal to knee. Has had a lot of issues with LE cramping lately, also is frustrated by her inability to walk or stand longer than 10 minutes.   PERTINENT HISTORY:  HTN, DM II, COPD  PAIN:  Are you having pain?  Yes: NPRS scale: 9/10 Pain location: lower back R>L, posterior hip R>L Pain description: sharp, tight, sore Aggravating factors: mornings, prolonged standing, walking Relieving factors: massage, medication  PRECAUTIONS: None  RED FLAGS: None   WEIGHT BEARING RESTRICTIONS: No  FALLS:  Has patient fallen in last 6 months? No  LIVING ENVIRONMENT: Lives with: lives alone Lives in: House/apartment Stairs: Yes: External: 12 steps; on right going up Has following equipment at home: Single point cane and Walker - 2 wheeled  OCCUPATION: Retired - Does [employer] a few times a week  PLOF: Independent  PATIENT GOALS: pt wants to decrease pain in her lower back to improve comfort with walking and shopping  NEXT MD VISIT: 03/01/2024  OBJECTIVE:  Note: Objective measures were completed at Evaluation unless otherwise noted.  DIAGNOSTIC FINDINGS:  See imaging  PATIENT SURVEYS:  ODI: 74% disability (37/50)  COGNITION: Overall cognitive status: Within functional limits for tasks assessed     SENSATION: [facility]  MUSCLE  LENGTH: Thomas test: Right (+); Left (+)  POSTURE: rounded shoulders, forward head, and increased lumbar lordosis  PALPATION: TTP to bilateral lumbar paraspinals  LUMBAR ROM:   AROM eval  Flexion 50%  Extension 25%  Right lateral flexion   Left lateral flexion   Right rotation   Left rotation    (Blank rows = not tested)  LOWER EXTREMITY MMT:    MMT Right eval Left eval  Hip flexion 3+/5 3+/5  Hip extension    Hip abduction 3+/5 3+/5  Hip adduction    Hip internal rotation    Hip external rotation    Knee flexion 4/5 4/5  Knee extension 4/5 4/5  Ankle dorsiflexion    Ankle plantarflexion    Ankle inversion    Ankle eversion     (Blank rows = not tested)  LUMBAR SPECIAL TESTS:  Straight leg raise test: Negative and Slump test: Negative  FUNCTIONAL TESTS:  30 Second Sit to Stand: 8 reps 03/14/24: 30 sec standing test 6 reps  03/22/24: 7 reps  GAIT: Distance walked: 70ft Assistive device utilized: Single point cane Level of assistance: SBA Comments: trunk flexed, decreased gait speed  TREATMENT: [facility] Adult PT Treatment:                                                DATE: 03/24/24 Therapeutic Exercise: Seated h/s stretch 2x30" Seated figure 4 2x30" Repeated lumbar flexion with ball x 15 STS 2x10 Nustep L5 UE/LE x 4 minutes for functional activity tolerance Modalities: Estim: PREMOD x 15 premod to tolerance bilateral glute and proximal hamstrings  HMP: L hip in R sidelying concurrent with estim (removed at 10 minutes)  PATIENT EDUCATION:  Education details: continue HEP Person educated: Patient Education method: Explanation, Demonstration, and Handouts Education comprehension: verbalized understanding and returned demonstration  HOME EXERCISE PROGRAM: Access Code: [email or web address] URL: [email or web address] Date: 02/23/2024 Prepared by: Loral Roch  Exercises - Supine Lower Trunk Rotation  -  1-2 x daily - 7 x weekly - 2 sets - 10  reps - Supine Posterior Pelvic Tilt  - 1-2 x daily - 7 x weekly - 2 sets - 10 reps - 5 sec hold - Modified Thomas Stretch  - 1-2 x daily - 7 x weekly - 2 reps - 30 sec hold - Seated Figure 4 Piriformis Stretch  - 1-2 x daily - 7 x weekly - 2 reps - 30 sec hold - Sit to Stand  - 1-2 x daily - 7 x weekly - 3 sets - 10 reps - Seated Hamstring Stretch  - 1-2 x daily - 7 x weekly - 2 reps - 30 sec hold  ASSESSMENT:  CLINICAL IMPRESSION: Pt was able to complete prescribed exercises but did get nauseous, decreased with ice water and cold wash cloth to face. Responded well to ESTIM noting decreased pain post. Progressing with therapy, will continue per POC.    EVAL: Patient is a 68 y.o. F who was seen today for physical therapy evaluation and treatment for chronic lower back pain and discomfort that radiates into bilateral LE;. Physical findings are consistent with referring provider impression as pt demonstrates decrease in core and hip strength as well as sharp decline in functional mobility. Oswestry score indicates severe disability in performance of home ADLs and higher level community activities. Pt would benefit from skilled PT services working on improving core/hip strength in order to decrease pain.    OBJECTIVE IMPAIRMENTS: Abnormal gait, decreased activity tolerance, decreased balance, decreased endurance, decreased mobility, difficulty walking, decreased ROM, decreased strength, improper body mechanics, and pain   ACTIVITY LIMITATIONS: carrying, lifting, sitting, standing, squatting, stairs, transfers, reach over head, and locomotion level  PARTICIPATION LIMITATIONS: meal prep, cleaning, driving, shopping, community activity, occupation, and yard work  PERSONAL FACTORS: Time since onset of injury/illness/exacerbation and 3+ comorbidities: HTN, DM II, COPD  are also affecting patient's functional outcome.   REHAB POTENTIAL: Good  CLINICAL DECISION MAKING: Evolving/moderate  complexity  EVALUATION COMPLEXITY: Moderate   GOALS: Goals reviewed with patient? No  SHORT TERM GOALS: Target date: 03/15/2024   Pt will be compliant and knowledgeable with initial HEP for improved comfort and carryover Baseline: initial HEP given  Goal status: INITIAL  2.  Pt will self report low back and LE pain no greater than 7/10 for improved comfort and functional ability Baseline: 10/10 at worst Goal status: INITIAL   LONG TERM GOALS: Target date: 04/20/2024   Pt will be decrease ODI disability score to no greater than 50% (25/50) as proxy for functional improvement with home ADLs and community activites Baseline: 74% disability (37/50) Goal status: INITIAL  2.  Pt will self report low back and LE pain no greater than 3/10 for improved comfort and functional ability Baseline: 10/10 at worst Goal status: INITIAL   3.  Pt will increase 30 Second Sit to Stand rep count to no less than 10 reps for improved balance, strength, and functional mobility Baseline: 8 reps  Goal status: INITIAL   4.  Pt will be able to improve standing and walking activity tolerance to at least 20 minutes for improved functional ability with community navigation Baseline: 10 minutes Goal status: INITIAL   PLAN:  PT FREQUENCY: 2x/week  PT DURATION: 8 weeks  PLANNED INTERVENTIONS: 97164- PT Re-evaluation, 97110-Therapeutic exercises, 97530- Therapeutic activity, [id number]- Neuromuscular re-education, 97535- Self Care, 16109- Manual therapy, [id number]- Aquatic Therapy, U0454- Electrical stimulation (unattended), [id number]- Electrical stimulation (manual), Dry Needling, Cryotherapy, and Moist heat.  PLAN FOR NEXT SESSION: Continue with therapeutic focus on Hamstring strength in full ROM, global BLE strength, dynamic core stability, an hip/lumbar mobility.   Ivor Mars PT  03/24/24 4:53 PM

## 2024-03-28 ENCOUNTER — Ambulatory Visit

## 2024-03-29 ENCOUNTER — Ambulatory Visit: Payer: Self-pay

## 2024-03-29 ENCOUNTER — Encounter: Payer: Self-pay | Admitting: Physician Assistant

## 2024-03-29 ENCOUNTER — Ambulatory Visit: Admitting: Physician Assistant

## 2024-03-29 VITALS — BP 148/60 | HR 69 | Ht 66.0 in | Wt 218.0 lb

## 2024-03-29 DIAGNOSIS — I10 Essential (primary) hypertension: Secondary | ICD-10-CM

## 2024-03-29 DIAGNOSIS — T7840XA Allergy, unspecified, initial encounter: Secondary | ICD-10-CM

## 2024-03-29 DIAGNOSIS — T783XXA Angioneurotic edema, initial encounter: Secondary | ICD-10-CM | POA: Diagnosis not present

## 2024-03-29 MED ORDER — EPINEPHRINE 0.3 MG/0.3ML IJ SOAJ: 0.3000 mg | INTRAMUSCULAR | 0 refills | Status: AC | PRN

## 2024-03-29 MED ORDER — LOSARTAN POTASSIUM 25 MG PO TABS: 25.0000 mg | ORAL_TABLET | Freq: Every day | ORAL | 0 refills | Status: DC

## 2024-03-29 MED ORDER — HYDROCHLOROTHIAZIDE 12.5 MG PO TABS: 12.5000 mg | ORAL_TABLET | Freq: Every day | ORAL | 1 refills | Status: DC

## 2024-03-29 NOTE — Telephone Encounter (Signed)
 Pt has an appt at 1 today on the [facility] for swelling in her face, lips. She thinks the cashews or lisinopril - hydrochlorothiazide  caused this. She does not want to take the lisinopril -hydrochlorothiazide  anymore. Please advise

## 2024-03-29 NOTE — Patient Instructions (Addendum)
 VISIT SUMMARY:  Today, you came in because of swelling in your lips that started early this morning. You took Benadryl, which helped a bit. You mentioned eating cashews last night and have a history of similar swelling episodes many years ago. You also shared that your sister had similar issues while on lisinopril , a medication you have been taking for several years. You have recently lost weight and are managing your diabetes with Mounjaro. Your blood sugar levels are stable, and you have no breathing or swallowing difficulties.  YOUR PLAN:  -ANGIOEDEMA DUE TO LISINOPRIL : Angioedema is swelling that can occur under the skin, often due to an allergic reaction. In your case, it is likely caused by lisinopril , a medication you have been taking for hypertension. You should avoid taking lisinopril  and cashews until further evaluation. You have been prescribed an EpiPen for emergency use and should call 911 if your symptoms worsen. Continue using diphenhydramine as needed and follow up with an allergist for allergy testing.  -HYPERTENSION: Hypertension is high blood pressure. Since lisinopril  may be causing your swelling, it has been discontinued. You have been prescribed hydrochlorothiazide  and losartan  to manage your blood pressure. Please start taking these new medications today.  -OBESITY: Obesity is a condition where you have an excessive amount of body fat. You have been managing your weight with tirzepatide and have successfully lost weight, going from 250 lbs to 223 lbs.  INSTRUCTIONS:  Please follow up with an allergist for allergy testing. If your symptoms worsen, use the EpiPen and call 911 immediately. Start taking your new blood pressure medications, hydrochlorothiazide  and losartan , today.      Angioedema Angioedema is the sudden swelling of tissue in the body. Angioedema can affect any part of the body, including the legs, hands, genitals, face, mouth, lips, and internal organs, like  your intestines. Depending on the cause, angioedema may happen just once. However, some people may have repeated bouts of angioedema during their lives. Symptoms may be mild and may occur along with other allergic symptoms such as itchy, red, swollen areas of skin (hives). Severe angioedema can be life-threatening if it affects the air passages and blocks breathing. What are the causes? This condition may be caused by: Foods, such as milk, eggs, shellfish, wheat, or nuts. Certain medicines, such as ACE inhibitors, birth control pills, dyes used in X-rays, or NSAIDs, such as ibuprofen . Hereditary angioedema (HAE) is genetic. Episodes can be triggered by: Illness, infection, or emotional or physical stress. Changes in hormone levels. Exercise. Minor surgical or dental procedures. In some cases, the cause of this condition is not known. What increases the risk? You are more likely to develop HAE if you have family members with this condition.  What are the signs or symptoms?  Symptoms of this condition depend on where the swelling happens.  Symptoms of this condition include: Swollen skin. Hives. Pain, pressure, or tenderness in the affected area. Swollen eyelids, face, lips, or tongue. Trouble drinking, swallowing, or closing the mouth completely. Hoarseness or sore throat. Wheezing or trouble breathing. If your internal organs are affected, symptoms may also include: Nausea. Pain in the abdomen. Vomiting or diarrhea. Trouble swallowing. Trouble passing urine. How is this diagnosed? This condition may be diagnosed based on: An exam of the affected area. Your medical history. Whether anyone in your family has had this condition before. A review of any medicines you have been taking. Tests, including: Allergy skin tests to see if the condition was caused by an allergic reaction.  Blood tests to see if the condition was caused by certain inherited or genetic diseases. How is this  treated? Treatment for this condition depends on the cause and severity of your symptoms. It may involve any of the following: Avoiding triggers, if they are known. Triggers may include foods or environmental allergens. Stopping medicines permanently if they cause the condition. These include ACE inhibitors. Taking medicines to treat symptoms or prevent future episodes. These may include: Antihistamines. Epinephrine injections. Steroids. Blood products to treat specific types of non-allergic angioedema. Breathing tubes or ventilators in severe cases in which breathing is affected. Severe cases of angioedema are treated at the hospital. Mild to moderate angioedema usually gets better in 24-48 hours. Follow these instructions at home:  Take over-the-counter and prescription medicines only as told by your health care provider. If you were given medicines for emergency allergy treatment, always carry them with you. This includes epinephrine injector kits. Wear a medical bracelet as told by your health care provider. If something triggers your condition, avoid the trigger. Triggers can be foods, environmental allergens, stress, or exercise. Avoid all medicines that caused your angioedema. This is for your entire life. If your condition is inherited and you are thinking about having children, talk to your health care provider. It is important to discuss the risks of passing on the condition to your children. Where to find more information American Academy of Allergy Asthma & Immunology: www.aaaai.org Contact a health care provider if: You continue to have repeated episodes of angioedema. Episodes of angioedema start to happen more often than they used to, even after you take steps to prevent them. You have episodes of angioedema that are more severe than they have been before, even after you take steps to prevent them. You are thinking about having children. Get help right away if: You have  severe swelling of your mouth, tongue, or lips. Your swelling gets worse. You have trouble breathing, swallowing, or talking. You have chest pain, dizziness or light-headedness, or you pass out. These symptoms may represent a serious problem that is an emergency. Do not wait to see if the symptoms will go away. Get medical help right away. Call your local emergency services (911 in the [place] Do not drive yourself to the hospital. Summary Angioedema is the sudden swelling of tissues. It is important to be aware of all triggers or causes for your angioedema and to avoid them. Treatment for this condition depends on the cause and severity of your symptoms. Severe angioedema can be life-threatening if it blocks the air passages. This information is not intended to replace advice given to you by your health care provider. Make sure you discuss any questions you have with your health care provider. Document Revised: 03/20/2021 Document Reviewed: 03/20/2021 Elsevier Patient Education  2024 [employer].

## 2024-03-29 NOTE — Progress Notes (Unsigned)
 New Patient Office Visit  Subjective    Patient ID: [name], female    DOB: [birth year]  Age: 68 y.o. MRN: [id number]  CC:  Chief Complaint  Patient presents with   Allergic Reaction    Patient states she was eating cashews last night before bed and woke up with facial swelling. Took 2 benadryl at 7 am     Discussed the use of AI scribe software for clinical note transcription with the patient, who gave verbal consent to proceed.  History of Present Illness   [name] is a 68 year old female with hypertension who presents with lip swelling.  She awoke at 4 AM with swollen lips and took two Benadryl pills at 7 AM, which led to some improvement. She consumed cashews the night before the swelling and denies previous nut allergies. Her sister experienced similar swelling while on lisinopril . She has been on lisinopril  for over four to five years and did not take it today due to the swelling. She recalls similar swelling episodes in different body parts about twenty years ago, requiring clinic visits. She has lost weight recently, from 250 to 223 pounds, and is on Mounjaro (tirzepatide) for diabetes management. Her blood sugar levels range from 116 to 150 mg/dL. She has no current breathing or swallowing difficulties and can eat soup without issues.    Outpatient Encounter Medications as of 03/29/2024  Medication Sig   acetaminophen  (TYLENOL ) 500 MG tablet Take 1,000 mg by mouth every 6 (six) hours as needed for mild pain (pain score 1-3).   Blood Glucose Monitoring Suppl (ACCU-CHEK AVIVA PLUS) w/Device KIT USE AS DIRECTED TO CHECK BLOOD GLUCOSE 1 TO 2 TIMES DAILY   diphenhydramine-acetaminophen  (TYLENOL  PM) 25-500 MG TABS tablet Take 1 tablet by mouth at bedtime as needed (sleep).   EPINEPHrine 0.3 mg/0.3 mL IJ SOAJ injection Inject 0.3 mg into the muscle as needed for anaphylaxis.   Fluticasone -Umeclidin-Vilant (TRELEGY ELLIPTA ) 200-62.5-25 MCG/ACT AEPB Take 1  Inhalation by mouth daily.   furosemide  (LASIX ) 20 MG tablet Take 1 tablet (20 mg total) by mouth daily.   hydrochlorothiazide  (HYDRODIURIL ) 12.5 MG tablet Take 1 tablet (12.5 mg total) by mouth daily.   ibuprofen  (ADVIL ) 800 MG tablet TAKE 1 TABLET BY MOUTH EVERY 8 HOURS AS NEEDED FOR MILD PAIN   levalbuterol  (XOPENEX  HFA) 45 MCG/ACT inhaler INHALE 2 PUFFS BY MOUTH EVERY 4 HOURS AS NEEDED FOR WHEEZING   losartan  (COZAAR ) 25 MG tablet Take 1 tablet (25 mg total) by mouth daily.   metFORMIN  (GLUCOPHAGE ) 500 MG tablet Take 1 tablet (500 mg total) by mouth 2 (two) times daily with a meal.   methocarbamol  (ROBAXIN ) 500 MG tablet Take 1 tablet (500 mg total) by mouth 3 (three) times daily.   montelukast  (SINGULAIR ) 10 MG tablet Take 1 tablet (10 mg total) by mouth at bedtime.   ondansetron  (ZOFRAN -ODT) 4 MG disintegrating tablet Take 1 tablet (4 mg total) by mouth every 8 (eight) hours as needed for nausea or vomiting.   pantoprazole  (PROTONIX ) 40 MG tablet Take 1 tablet (40 mg total) by mouth daily.   tirzepatide (MOUNJARO) 2.5 MG/0.5ML Pen Inject 2.5 mg into the skin once a week.   tirzepatide (MOUNJARO) 5 MG/0.5ML Pen Inject 5 mg into the skin once a week.   traMADol  (ULTRAM ) 50 MG tablet Take 1 tablet (50 mg total) by mouth 2 (two) times daily as needed.   [DISCONTINUED] lisinopril -hydrochlorothiazide  (ZESTORETIC ) 20-12.5 MG tablet Take 1 tablet  by mouth daily.   doxycycline  (VIBRA -TABS) 100 MG tablet Take 1 tablet (100 mg total) by mouth 2 (two) times daily. (Patient not taking: Reported on 03/29/2024)   No facility-administered encounter medications on file as of 03/29/2024.    Past Medical History:  Diagnosis Date   Arthritis    Asthma    Colonic polyp    GERD (gastroesophageal reflux disease)    Hypertension    Obesity    Pain in limb    Smoker    Swelling of limb     Past Surgical History:  Procedure Laterality Date   BIOPSY BREAST Left 09/22/2018   no maglignancy   CATARACT  EXTRACTION Right 05/25/2023   COLONOSCOPY  2009   Dr. Elvin Hammer   POLYPECTOMY      Family History  Problem Relation Age of Onset   Lung cancer Father    Cirrhosis Mother    Cirrhosis Brother    Colon cancer Neg Hx    Colon polyps Neg Hx    Esophageal cancer Neg Hx    Rectal cancer Neg Hx    Stomach cancer Neg Hx     Social History   Socioeconomic History   Marital status: Widowed    Spouse name: Not on file   Number of children: Not on file   Years of education: Not on file   Highest education level: Not on file  Occupational History   Not on file  Tobacco Use   Smoking status: Former    Current packs/day: 0.25    Average packs/day: 0.3 packs/day for 10.0 years (2.5 ttl pk-yrs)    Types: Cigarettes   Smokeless tobacco: Never   Tobacco comments:    quit oct 2018  Vaping Use   Vaping status: Never Used  Substance and Sexual Activity   Alcohol use: Not Currently    Comment: socially   Drug use: Not Currently    Types: Marijuana    Comment: social   Sexual activity: Not Currently  Other Topics Concern   Not on file  Social History Narrative   Not on file   Social Drivers of Health   Financial Resource Strain: Low Risk  (06/16/2023)   Overall Financial Resource Strain (CARDIA)    Difficulty of Paying Living Expenses: Not hard at all  Food Insecurity: No Food Insecurity (06/16/2023)   Hunger Vital Sign    Worried About Running Out of Food in the Last Year: Never true    Ran Out of Food in the Last Year: Never true  Transportation Needs: No Transportation Needs (06/16/2023)   PRAPARE - Administrator, Civil Service (Medical): No    Lack of Transportation (Non-Medical): No  Physical Activity: Inactive (06/16/2023)   Exercise Vital Sign    Days of Exercise per Week: 0 days    Minutes of Exercise per Session: 0 min  Stress: Stress Concern Present (06/16/2023)   [employer] of Occupational Health - Occupational Stress Questionnaire    Feeling of  Stress : To some extent  Social Connections: Socially Isolated (06/16/2023)   Social Connection and Isolation Panel [NHANES]    Frequency of Communication with Friends and Family: More than three times a week    Frequency of Social Gatherings with Friends and Family: Never    Attends Religious Services: Never    Database administrator or Organizations: No    Attends Banker Meetings: Never    Marital Status: Widowed  Intimate Partner Violence: Not  At Risk (06/16/2023)   Humiliation, Afraid, Rape, and Kick questionnaire    Fear of Current or Ex-Partner: No    Emotionally Abused: No    Physically Abused: No    Sexually Abused: No    Review of Systems  Constitutional:  Negative for chills and fever.  HENT: Negative.    Eyes: Negative.   Respiratory:  Negative for shortness of breath.   Cardiovascular:  Negative for chest pain.  Gastrointestinal: Negative.   Genitourinary: Negative.   Musculoskeletal: Negative.   Skin: Negative.   Neurological: Negative.   Endo/Heme/Allergies: Negative.   Psychiatric/Behavioral: Negative.          Objective    BP (!) 148/60 (BP Location: Left Arm, Patient Position: Sitting, Cuff Size: Large)   Pulse 69   Ht 5\' 6"  (1.676 m)   Wt 218 lb (98.9 kg)   BMI 35.19 kg/m   Physical Exam Vitals and nursing note reviewed.  HENT:     Mouth/Throat:     Mouth: Angioedema present.    GENERAL: Alert, cooperative, well developed, no acute distress. HEENT: Normocephalic, normal oropharynx, moist mucous membranes, adenoids midway. CHEST: Clear to auscultation bilaterally, no rhonchi or crackles. CARDIOVASCULAR: Normal heart rate and rhythm, S1 and S2 normal without murmurs. ABDOMEN: Soft, non-tender, non-distended, without organomegaly, normal bowel sounds. EXTREMITIES: No cyanosis or edema. NEUROLOGICAL: Cranial nerves grossly intact, moves all extremities without gross motor or sensory deficit.    Assessment & Plan:   Problem List  Items Addressed This Visit   None Visit Diagnoses       Angioedema, initial encounter    -  Primary     Allergic reaction, initial encounter       Relevant Medications   EPINEPHrine 0.3 mg/0.3 mL IJ SOAJ injection   Other Relevant Orders   Ambulatory referral to Allergy     Essential hypertension       Relevant Medications   EPINEPHrine 0.3 mg/0.3 mL IJ SOAJ injection   losartan  (COZAAR ) 25 MG tablet   hydrochlorothiazide  (HYDRODIURIL ) 12.5 MG tablet      1. Angioedema, initial encounter (Primary)  Angioedema due to lisinopril  Acute angioedema likely due to lisinopril . Differential includes nut allergy. Symptoms improved with diphenhydramine. - Prescribed EpiPen for emergency use. - Instructed to avoid lisinopril  and cashews/nuts until further evaluation. - Referred to allergist for allergy testing. - Advised to call 911 and use EpiPen if symptoms worsen. - Recommended continued use of diphenhydramine as needed.  2. Allergic reaction, initial encounter  - Ambulatory referral to Allergy - EPINEPHrine 0.3 mg/0.3 mL IJ SOAJ injection; Inject 0.3 mg into the muscle as needed for anaphylaxis.  Dispense: 1 each; Refill: 0  3. Essential hypertension Hypertension management requires adjustment due to potential angioedema from lisinopril . Blood pressure slightly elevated. - Discontinued lisinopril . - Prescribed hydrochlorothiazide  and losartan  as separate medications. - Advised to take new antihypertensive medications today due to elevated blood pressure - losartan  (COZAAR ) 25 MG tablet; Take 1 tablet (25 mg total) by mouth daily.  Dispense: 30 tablet; Refill: 0 - hydrochlorothiazide  (HYDRODIURIL ) 12.5 MG tablet; Take 1 tablet (12.5 mg total) by mouth daily.  Dispense: 30 tablet; Refill: 1   I have reviewed the patient's medical history (PMH, PSH, Social History, Family History, Medications, and allergies) , and have been updated if relevant. I spent 30 minutes reviewing chart and   face to face time with patient.    Return if symptoms worsen or fail to improve.   Breda Bond S Mayers,  PA-C

## 2024-03-29 NOTE — Telephone Encounter (Signed)
 [facility] due to swelling. Medication was changed  Hypertension Hypertension management requires adjustment due to potential angioedema from lisinopril . Blood pressure slightly elevated. - Discontinued lisinopril . - Prescribed hydrochlorothiazide  and losartan  as separate medications. - Advised to take new antihypertensive medications today due to elevated blood pressure.

## 2024-03-29 NOTE — Telephone Encounter (Signed)
  Chief Complaint: facial swelling Symptoms: none Frequency: 0405 this am  Pertinent Negatives: Patient denies difficulty breathing, wheezing, difficulty swallowing, itching or rash Disposition: [] ED /[] Urgent Care (no appt availability in office) / [] Appointment(In office/virtual)/ []  [facility]/ [] Home Care/ [] Refused Recommended Disposition /[x] [facility]/ []  Follow-up with PCP Additional Notes: no appts until tomorrow. Advised UC or [facility]. Address and hours of operation provided to pt. Pt took 50 mg Benadryl 2 hours ago (0922) and is putting ice pack to lips. Advised to call 911 if worsens or has difficulty breathing or mouth or tongue swelling Copied from CRM [id number]. Topic: Clinical - Red Word Triage >> Mar 29, 2024 11:04 AM Everlene Hobby D wrote: Patient's face and lips and nose are swollen she says she doesn't know if its a reaction from peanuts or a medication. Since 4am this morning Reason for Disposition  SEVERE swelling of the entire face  Answer Assessment - Initial Assessment Questions 1. ONSET: "When did the swelling start?" (e.g., minutes, hours, days)     0405 2. LOCATION: "What part of the face is swollen?"     Face, lips, nose  3. SEVERITY: "How swollen is it?"     Lips very swollen  4. ITCHING: "Is there any itching?" If Yes, ask: "How much?"   (Scale 1-10; mild, moderate or severe)     no 5. PAIN: "Is the swelling painful to touch?" If Yes, ask: "How painful is it?"   (Scale 1-10; mild, moderate or severe)   - NONE (0): no pain   - MILD (1-3): doesn't interfere with normal activities    - MODERATE (4-7): interferes with normal activities or awakens from sleep    - SEVERE (8-10): excruciating pain, unable to do any normal activities      no 6. FEVER: "Do you have a fever?" If Yes, ask: "What is it, how was it measured, and when did it start?"      No  7. CAUSE: "What do you think is causing the face swelling?"     cashews or a medication  (lisinopril )   9. OTHER SYMPTOMS: "Do you have any other symptoms?" (e.g., toothache, leg swelling)     no  Protocols used: Face Swelling-A-AH

## 2024-03-30 ENCOUNTER — Encounter: Payer: Self-pay | Admitting: Physician Assistant

## 2024-03-30 ENCOUNTER — Ambulatory Visit: Admitting: Physical Therapy

## 2024-03-30 ENCOUNTER — Encounter: Payer: Self-pay | Admitting: Physical Therapy

## 2024-03-30 DIAGNOSIS — M6281 Muscle weakness (generalized): Secondary | ICD-10-CM

## 2024-03-30 DIAGNOSIS — M5459 Other low back pain: Secondary | ICD-10-CM | POA: Diagnosis not present

## 2024-03-30 DIAGNOSIS — R2689 Other abnormalities of gait and mobility: Secondary | ICD-10-CM

## 2024-03-30 NOTE — Therapy (Signed)
 OUTPATIENT PHYSICAL THERAPY TREATMENT   Patient Name: [name] MRN: [id number] DOB:09-01-1956, 68 y.o., female Today's Date: 03/30/2024  END OF SESSION:  PT End of Session - 03/30/24 1446     Visit Number 8    Number of Visits 17    Date for PT Re-Evaluation 04/20/24    Authorization Type [employer] Dual Complete    PT Start Time 0245    PT Stop Time 0337    PT Time Calculation (min) 52 min                Past Medical History:  Diagnosis Date   Arthritis    Asthma    Colonic polyp    GERD (gastroesophageal reflux disease)    Hypertension    Obesity    Pain in limb    Smoker    Swelling of limb    Past Surgical History:  Procedure Laterality Date   BIOPSY BREAST Left 09/22/2018   no maglignancy   CATARACT EXTRACTION Right 05/25/2023   COLONOSCOPY  2009   Dr. Elvin Hammer   POLYPECTOMY     Patient Active Problem List   Diagnosis Date Noted   Myopathy, unspecified 07/15/2023   COPD (chronic obstructive pulmonary disease) (HCC) 11/30/2022   Stage 3a chronic kidney disease (HCC) 01/23/2021   History of cataract surgery 04/27/2020   Multiple somatic complaints 01/10/2019   Adenomatous polyp of colon 05/25/2018   Asthma, chronic, mild persistent, uncomplicated 03/06/2017   Chronic seasonal allergic rhinitis due to pollen 03/06/2017   Gastroesophageal reflux disease without esophagitis 10/10/2014   Ex-cigarette smoker 09/28/2013   Type 2 diabetes mellitus with complications (HCC) 07/12/2012   Obesity (BMI 30-39.9) 07/12/2012   Chronic back pain 07/12/2012   Hypertension associated with diabetes (HCC) 07/12/2012    PCP: Watson Hacking, MD  REFERRING PROVIDER: Claudene Crystal, PA-C   REFERRING DIAG: [phone] (ICD-10-CM) - Chronic low back pain, unspecified back pain laterality, unspecified whether sciatica present   Rationale for Evaluation and Treatment: Rehabilitation  THERAPY DIAG:  Other low back pain  Muscle weakness  (generalized)  Other abnormalities of gait and mobility  ONSET DATE: Chronic  SUBJECTIVE:                                                                                                                                                                                           SUBJECTIVE STATEMENT: Pt presents to PT with continued posterior hip and thigh pain. Has been fairly compliant with HEP.   EVAL: Pt presents to PT with reports of chronic LBP with refers into posterior LE, R>L. Pt is well known  to clinic, has had past success to PT. Denies bowel/bladder changes or saddle anesthesia. Also denies N/T down LE and pain does not go distal to knee. Has had a lot of issues with LE cramping lately, also is frustrated by her inability to walk or stand longer than 10 minutes.   PERTINENT HISTORY:  HTN, DM II, COPD  PAIN:  Are you having pain?  Yes: NPRS scale: 9/10 Pain location: lower back R>L, posterior hip R>L Pain description: sharp, tight, sore Aggravating factors: mornings, prolonged standing, walking Relieving factors: massage, medication  PRECAUTIONS: None  RED FLAGS: None   WEIGHT BEARING RESTRICTIONS: No  FALLS:  Has patient fallen in last 6 months? No  LIVING ENVIRONMENT: Lives with: lives alone Lives in: House/apartment Stairs: Yes: External: 12 steps; on right going up Has following equipment at home: Single point cane and Walker - 2 wheeled  OCCUPATION: Retired - Does [employer] a few times a week  PLOF: Independent  PATIENT GOALS: pt wants to decrease pain in her lower back to improve comfort with walking and shopping  NEXT MD VISIT: 03/01/2024  OBJECTIVE:  Note: Objective measures were completed at Evaluation unless otherwise noted.  DIAGNOSTIC FINDINGS:  See imaging  PATIENT SURVEYS:  ODI: 74% disability (37/50)  COGNITION: Overall cognitive status: Within functional limits for tasks assessed     SENSATION: [facility]  MUSCLE  LENGTH: Thomas test: Right (+); Left (+)  POSTURE: rounded shoulders, forward head, and increased lumbar lordosis  PALPATION: TTP to bilateral lumbar paraspinals  LUMBAR ROM:   AROM eval  Flexion 50%  Extension 25%  Right lateral flexion   Left lateral flexion   Right rotation   Left rotation    (Blank rows = not tested)  LOWER EXTREMITY MMT:    MMT Right eval Left eval  Hip flexion 3+/5 3+/5  Hip extension    Hip abduction 3+/5 3+/5  Hip adduction    Hip internal rotation    Hip external rotation    Knee flexion 4/5 4/5  Knee extension 4/5 4/5  Ankle dorsiflexion    Ankle plantarflexion    Ankle inversion    Ankle eversion     (Blank rows = not tested)  LUMBAR SPECIAL TESTS:  Straight leg raise test: Negative and Slump test: Negative  FUNCTIONAL TESTS:  30 Second Sit to Stand: 8 reps 03/14/24: 30 sec standing test 6 reps  03/22/24: 7 reps  GAIT: Distance walked: 37ft Assistive device utilized: Single point cane Level of assistance: SBA Comments: trunk flexed, decreased gait speed  TREATMENT: [facility] Adult PT Treatment:                                                DATE: 03/30/24 Therapeutic Exercise: Seated lumbar flexion with ball x 20 Single Long sitting h/s stretches on mat , x 3 bilateral  STS 10 x 2 , added 10#  Figure 4 stretch seated bilateral  Nustep    Modalities: Estim : pre mod 1 channel low back , channel 2 bilateral hips x 1 5 min    [facility] Adult PT Treatment:  DATE: 03/24/24 Therapeutic Exercise: Seated h/s stretch 2x30" Seated figure 4 2x30" Repeated lumbar flexion with ball x 15 STS 2x10 Nustep L5 UE/LE x 4 minutes for functional activity tolerance Modalities: Estim: PREMOD x 15 premod to tolerance bilateral glute and proximal hamstrings  HMP: L hip in R sidelying concurrent with estim (removed at 10 minutes)  PATIENT EDUCATION:  Education details: continue HEP Person educated:  Patient Education method: Explanation, Demonstration, and Handouts Education comprehension: verbalized understanding and returned demonstration  HOME EXERCISE PROGRAM: Access Code: [email or web address] URL: [email or web address] Date: 02/23/2024 Prepared by: Loral Roch  Exercises - Supine Lower Trunk Rotation  - 1-2 x daily - 7 x weekly - 2 sets - 10 reps - Supine Posterior Pelvic Tilt  - 1-2 x daily - 7 x weekly - 2 sets - 10 reps - 5 sec hold - Modified Thomas Stretch  - 1-2 x daily - 7 x weekly - 2 reps - 30 sec hold - Seated Figure 4 Piriformis Stretch  - 1-2 x daily - 7 x weekly - 2 reps - 30 sec hold - Sit to Stand  - 1-2 x daily - 7 x weekly - 3 sets - 10 reps - Seated Hamstring Stretch  - 1-2 x daily - 7 x weekly - 2 reps - 30 sec hold  ASSESSMENT:  CLINICAL IMPRESSION: Pt reports she was able to dance a little this past weekend however she does still have episodes of pain to 10/10. She continues to feel relief in clinic after estim however she has not yet ordered her own. Added 10# to STS with good tolerance today. Will plan to progress to more closed chain over remaining 2 sessions and assess response. Pt with difficulty scheduling due to appointment unavailability and need for 245 appts only. Was able to schedule for 2 more visits in the remaining 3 weeks of POC. Could consider extension if indicated.    EVAL: Patient is a 68 y.o. F who was seen today for physical therapy evaluation and treatment for chronic lower back pain and discomfort that radiates into bilateral LE;. Physical findings are consistent with referring provider impression as pt demonstrates decrease in core and hip strength as well as sharp decline in functional mobility. Oswestry score indicates severe disability in performance of home ADLs and higher level community activities. Pt would benefit from skilled PT services working on improving core/hip strength in order to decrease pain.    OBJECTIVE  IMPAIRMENTS: Abnormal gait, decreased activity tolerance, decreased balance, decreased endurance, decreased mobility, difficulty walking, decreased ROM, decreased strength, improper body mechanics, and pain   ACTIVITY LIMITATIONS: carrying, lifting, sitting, standing, squatting, stairs, transfers, reach over head, and locomotion level  PARTICIPATION LIMITATIONS: meal prep, cleaning, driving, shopping, community activity, occupation, and yard work  PERSONAL FACTORS: Time since onset of injury/illness/exacerbation and 3+ comorbidities: HTN, DM II, COPD  are also affecting patient's functional outcome.   REHAB POTENTIAL: Good  CLINICAL DECISION MAKING: Evolving/moderate complexity  EVALUATION COMPLEXITY: Moderate   GOALS: Goals reviewed with patient? No  SHORT TERM GOALS: Target date: 03/15/2024   Pt will be compliant and knowledgeable with initial HEP for improved comfort and carryover Baseline: initial HEP given  03/30/24: reports compliance  Goal status: MET  2.  Pt will self report low back and LE pain no greater than 7/10 for improved comfort and functional ability Baseline: 10/10 at worst    03/30/24: 10/10 at worst  Goal status: ONGOING   LONG TERM GOALS: Target date: 04/20/2024  Pt will be decrease ODI disability score to no greater than 50% (25/50) as proxy for functional improvement with home ADLs and community activites Baseline: 74% disability (37/50) Goal status: INITIAL  2.  Pt will self report low back and LE pain no greater than 3/10 for improved comfort and functional ability Baseline: 10/10 at worst Goal status: INITIAL   3.  Pt will increase 30 Second Sit to Stand rep count to no less than 10 reps for improved balance, strength, and functional mobility Baseline: 8 reps  Goal status: INITIAL   4.  Pt will be able to improve standing and walking activity tolerance to at least 20 minutes for improved functional ability with community navigation Baseline: 10  minutes Goal status: INITIAL   PLAN:  PT FREQUENCY: 2x/week  PT DURATION: 8 weeks  PLANNED INTERVENTIONS: 97164- PT Re-evaluation, 97110-Therapeutic exercises, 97530- Therapeutic activity, [id number]- Neuromuscular re-education, 97535- Self Care, 47829- Manual therapy, [id number]- Aquatic Therapy, F6213- Electrical stimulation (unattended), [id number]- Electrical stimulation (manual), Dry Needling, Cryotherapy, and Moist heat.  PLAN FOR NEXT SESSION: Continue with therapeutic focus on Hamstring strength in full ROM, global BLE strength, dynamic core stability, an hip/lumbar mobility.   Susana Enter PTA  03/30/24 3:47 PM

## 2024-04-13 ENCOUNTER — Encounter: Payer: Self-pay | Admitting: Physical Medicine and Rehabilitation

## 2024-04-13 ENCOUNTER — Ambulatory Visit: Admitting: Physical Medicine and Rehabilitation

## 2024-04-13 ENCOUNTER — Ambulatory Visit: Payer: Self-pay

## 2024-04-13 DIAGNOSIS — M48062 Spinal stenosis, lumbar region with neurogenic claudication: Secondary | ICD-10-CM

## 2024-04-13 DIAGNOSIS — M47816 Spondylosis without myelopathy or radiculopathy, lumbar region: Secondary | ICD-10-CM

## 2024-04-13 DIAGNOSIS — M48061 Spinal stenosis, lumbar region without neurogenic claudication: Secondary | ICD-10-CM | POA: Diagnosis not present

## 2024-04-13 DIAGNOSIS — M7918 Myalgia, other site: Secondary | ICD-10-CM

## 2024-04-13 DIAGNOSIS — G8929 Other chronic pain: Secondary | ICD-10-CM

## 2024-04-13 DIAGNOSIS — M5441 Lumbago with sciatica, right side: Secondary | ICD-10-CM

## 2024-04-13 DIAGNOSIS — M5442 Lumbago with sciatica, left side: Secondary | ICD-10-CM | POA: Diagnosis not present

## 2024-04-13 NOTE — Progress Notes (Signed)
 Pain Scale   Average Pain 8 Patient advising she has chronic lower back pain. Patient here for MRI review        +Driver, -BT, -Dye Allergies.

## 2024-04-13 NOTE — Progress Notes (Signed)
 [name] - 68 y.o. female MRN [id number]  Date of birth: 17-Jun-1956  Office Visit Note: Visit Date: 04/13/2024 PCP: Watson Hacking, MD Referred by: Watson Hacking, MD  Subjective: Chief Complaint  Patient presents with   Lower Back - Pain   HPI: [name] is a 68 y.o. female who comes in today for evaluation of chronic, worsening and severe bilateral buttock pain radiating to posterior thighs down to knees. Pain ongoing for several years, worsens with activity and movement. Standing and walking for prolonged periods of time is severely painful. She describes her pain as sore and aching sensation, currently rates as 8 out of 10. Some relief of pain with home exercise regimen, rest and use of medications. History of formal physical therapy several years ago with some relief of pain. Recent lumbar MRI imaging shows moderate to severe canal stenosis at L4-L5 secondary to disc bulging and facet arthropathy. There is also severe bilateral foraminal stenosis at L5-S1.  Patient underwent right L5-S1 interlaminar epidural steroid injection in our office on 01/07/2024, no relief of pain with this injection. Patient denies focal weakness, numbness and tingling. No recent trauma or falls.       Review of Systems  Musculoskeletal:  Positive for back pain.  Neurological:  Negative for tingling, sensory change, focal weakness and weakness.  All other systems reviewed and are negative.  Otherwise per HPI.  Assessment & Plan: Visit Diagnoses:    ICD-10-CM   1. Chronic bilateral low back pain with bilateral sciatica  M54.42 Ambulatory referral to Physical Medicine Rehab   M54.41    G89.29     2. Chronic buttock pain  M79.18 Ambulatory referral to Physical Medicine Rehab   G89.29     3. Spinal stenosis of lumbar region with neurogenic claudication  M48.062 Ambulatory referral to Physical Medicine Rehab    4. Facet arthropathy, lumbar  M47.816 Ambulatory referral to  Physical Medicine Rehab    5. Foraminal stenosis of lumbar region  M48.061 Ambulatory referral to Physical Medicine Rehab       Plan: Findings:  Chronic, worsening and severe bilateral buttock pain radiating to posterior thighs down to knees. Patient continues to have severe pain despite good conservative therapies such as formal physical therapy, home exercise regimen, rest and use of medications. I discussed lumbar MRI with her today using imaging and spine model. Patients clinical presentation and exam are consistent with neurogenic claudication as a result of spinal canal stenosis. There is moderate to severe canal stenosis at the level of L4-L5. We discussed treatment plan in detail today, next step is to perform diagnostic and hopefully therapeutic L4-L5 interlaminar epidural steroid injection under fluoroscopic guidance. She is not currently taking anticoagulant medications. If good relief of pain with injection we can repeat this procedure infrequently as needed. She has no questions regarding injection procedure. Should her pain persist would consider referral to our spine surgeon Dr. Colette Davies to discuss surgical options. No red flag symptoms noted upon exam today.     Meds & Orders: No orders of the defined types were placed in this encounter.   Orders Placed This Encounter  Procedures   Ambulatory referral to Physical Medicine Rehab    Follow-up: Return for L4-L5 interlaminar epidural steroid injection.   Procedures: No procedures performed      Clinical History: CLINICAL DATA:  Low back pain   EXAM: MRI LUMBAR SPINE WITHOUT CONTRAST   TECHNIQUE: Multiplanar, multisequence MR imaging of  the lumbar spine was performed. No intravenous contrast was administered.   COMPARISON:  MRI of the lumbar spine dated 01/20/2021   FINDINGS: Segmentation: Standard.   Alignment:  Grade 1 anterolisthesis of L4 on L5.   Vertebrae: Vertebral bodies demonstrate normal signal  intensity. No compression fractures.   Conus medullaris and cauda equina: The conus medullaris terminates at the level of L1-L2. The distal spinal cord signal intensity is normal.   Paraspinal and other soft tissues: The visualized abdomen and pelvis show no soft tissue abnormality. The visualized aorta is normal.   Disc levels:   L1-L2: Disc is normal in configuration. Mild bilateral facet arthropathy. No neuroforaminal stenosis. No spinal canal stenosis.   L2-L3: Disc is normal in configuration. Mild bilateral facet arthropathy. No neuroforaminal stenosis. No spinal canal stenosis.   L3-L4: Disc is normal in configuration. Mild bilateral facet arthropathy. No neuroforaminal stenosis. No spinal canal stenosis.   L4-L5: Disc bulge. Severe bilateral facet arthropathy. Severe bilateral neuroforaminal stenosis. Moderate to severe spinal canal stenosis.   L5-S1: Disc bulge. Severe bilateral facet arthropathy. Severe bilateral neuroforaminal stenosis. Mild spinal canal stenosis.   IMPRESSION: 1. Moderate to severe canal stenosis at L4-L5 secondary to disc bulging and facet arthropathy. 2. Severe bilateral foraminal stenosis at L4-L5 and L5-S secondary to disc bulging and facet arthropathy.     Electronically Signed   By: Johnanna Mylar M.D.   On: 04/05/2024 09:55   She reports that she has quit smoking. Her smoking use included cigarettes. She has a 2.5 pack-year smoking history. She has never used smokeless tobacco.  Recent Labs    09/24/23 1620 03/21/24 1706  HGBA1C 7.0* 7.4*    Objective:  VS:  HT:    WT:   BMI:     BP:   HR: bpm  TEMP: ( )  RESP:  Physical Exam Vitals and nursing note reviewed.  HENT:     Head: Normocephalic and atraumatic.     Right Ear: External ear normal.     Left Ear: External ear normal.     Nose: Nose normal.     Mouth/Throat:     Mouth: Mucous membranes are moist.  Eyes:     Extraocular Movements: Extraocular movements intact.   Cardiovascular:     Rate and Rhythm: Normal rate.     Pulses: Normal pulses.  Pulmonary:     Effort: Pulmonary effort is normal.  Abdominal:     General: Abdomen is flat. There is no distension.  Musculoskeletal:        General: Tenderness present.     Cervical back: Normal range of motion.     Comments: Patient rises from seated position to standing without difficulty. Good lumbar range of motion. No pain noted with facet loading. 5/5 strength noted with bilateral hip flexion, knee flexion/extension, ankle dorsiflexion/plantarflexion and EHL. No clonus noted bilaterally. No pain upon palpation of greater trochanters. No pain with internal/external rotation of bilateral hips. Sensation intact bilaterally. Negative slump test bilaterally. Ambulates without aid, gait steady.     Skin:    General: Skin is warm and dry.     Capillary Refill: Capillary refill takes less than 2 seconds.  Neurological:     General: No focal deficit present.     Mental Status: She is alert and oriented to person, place, and time.  Psychiatric:        Mood and Affect: Mood normal.        Behavior: Behavior normal.  Ortho Exam  Imaging: No results found.  Past Medical/Family/Surgical/Social History: Medications & Allergies reviewed per EMR, new medications updated. Patient Active Problem List   Diagnosis Date Noted   Myopathy, unspecified 07/15/2023   COPD (chronic obstructive pulmonary disease) (HCC) 11/30/2022   Stage 3a chronic kidney disease (HCC) 01/23/2021   History of cataract surgery 04/27/2020   Multiple somatic complaints 01/10/2019   Adenomatous polyp of colon 05/25/2018   Asthma, chronic, mild persistent, uncomplicated 03/06/2017   Chronic seasonal allergic rhinitis due to pollen 03/06/2017   Gastroesophageal reflux disease without esophagitis 10/10/2014   Ex-cigarette smoker 09/28/2013   Type 2 diabetes mellitus with complications (HCC) 07/12/2012   Obesity (BMI 30-39.9) 07/12/2012    Chronic back pain 07/12/2012   Hypertension associated with diabetes (HCC) 07/12/2012   Past Medical History:  Diagnosis Date   Arthritis    Asthma    Colonic polyp    GERD (gastroesophageal reflux disease)    Hypertension    Obesity    Pain in limb    Smoker    Swelling of limb    Family History  Problem Relation Age of Onset   Lung cancer Father    Cirrhosis Mother    Cirrhosis Brother    Colon cancer Neg Hx    Colon polyps Neg Hx    Esophageal cancer Neg Hx    Rectal cancer Neg Hx    Stomach cancer Neg Hx    Past Surgical History:  Procedure Laterality Date   BIOPSY BREAST Left 09/22/2018   no maglignancy   CATARACT EXTRACTION Right 05/25/2023   COLONOSCOPY  2009   Dr. Elvin Hammer   POLYPECTOMY     Social History   Occupational History   Not on file  Tobacco Use   Smoking status: Former    Current packs/day: 0.25    Average packs/day: 0.3 packs/day for 10.0 years (2.5 ttl pk-yrs)    Types: Cigarettes   Smokeless tobacco: Never   Tobacco comments:    quit oct 2018  Vaping Use   Vaping status: Never Used  Substance and Sexual Activity   Alcohol use: Not Currently    Comment: socially   Drug use: Not Currently    Types: Marijuana    Comment: social   Sexual activity: Not Currently

## 2024-04-14 ENCOUNTER — Telehealth: Payer: Self-pay | Admitting: Physical Medicine and Rehabilitation

## 2024-04-14 NOTE — Telephone Encounter (Signed)
 Pt returning a phone call back. Pt stated she can be scheduled any day and any time after 2:30pm. Also a voice meail can be left if she doesn't answer.

## 2024-04-18 ENCOUNTER — Ambulatory Visit: Attending: Medical

## 2024-04-18 DIAGNOSIS — M6281 Muscle weakness (generalized): Secondary | ICD-10-CM | POA: Diagnosis not present

## 2024-04-18 DIAGNOSIS — R2689 Other abnormalities of gait and mobility: Secondary | ICD-10-CM | POA: Insufficient documentation

## 2024-04-18 DIAGNOSIS — M5459 Other low back pain: Secondary | ICD-10-CM | POA: Insufficient documentation

## 2024-04-18 NOTE — Therapy (Signed)
 OUTPATIENT PHYSICAL THERAPY TREATMENT/RECERTIFICATION   Patient Name: [name] MRN: [id number] DOB:07/07/1956, 68 y.o., female Today's Date: 04/19/2024  END OF SESSION:  PT End of Session - 04/18/24 1458     Visit Number 9    Number of Visits 17    Date for PT Re-Evaluation 05/23/24    Authorization Type [employer] Dual Complete    PT Start Time 1458    PT Stop Time 1515    PT Time Calculation (min) 17 min                 Past Medical History:  Diagnosis Date   Arthritis    Asthma    Colonic polyp    GERD (gastroesophageal reflux disease)    Hypertension    Obesity    Pain in limb    Smoker    Swelling of limb    Past Surgical History:  Procedure Laterality Date   BIOPSY BREAST Left 09/22/2018   no maglignancy   CATARACT EXTRACTION Right 05/25/2023   COLONOSCOPY  2009   Dr. Elvin Hammer   POLYPECTOMY     Patient Active Problem List   Diagnosis Date Noted   Myopathy, unspecified 07/15/2023   COPD (chronic obstructive pulmonary disease) (HCC) 11/30/2022   Stage 3a chronic kidney disease (HCC) 01/23/2021   History of cataract surgery 04/27/2020   Multiple somatic complaints 01/10/2019   Adenomatous polyp of colon 05/25/2018   Asthma, chronic, mild persistent, uncomplicated 03/06/2017   Chronic seasonal allergic rhinitis due to pollen 03/06/2017   Gastroesophageal reflux disease without esophagitis 10/10/2014   Ex-cigarette smoker 09/28/2013   Type 2 diabetes mellitus with complications (HCC) 07/12/2012   Obesity (BMI 30-39.9) 07/12/2012   Chronic back pain 07/12/2012   Hypertension associated with diabetes (HCC) 07/12/2012    PCP: Watson Hacking, MD  REFERRING PROVIDER: Claudene Crystal, PA-C   REFERRING DIAG: [id number] (ICD-10-CM) - Chronic low back pain, unspecified back pain laterality, unspecified whether sciatica present   Rationale for Evaluation and Treatment: Rehabilitation  THERAPY DIAG:  Other low back pain  Muscle weakness  (generalized)  Other abnormalities of gait and mobility  ONSET DATE: Chronic  SUBJECTIVE:                                                                                                                                                                                           SUBJECTIVE STATEMENT: Pt presents to PT with continued reports of pain and recent diagnosis of spinal stenosis on MRI. Pt has acquired a TENS unit and notes some relief with use at home. Wants to schedule a few more visits  since it has been a bit since she could get in.   EVAL: Pt presents to PT with reports of chronic LBP with refers into posterior LE, R>L. Pt is well known to clinic, has had past success to PT. Denies bowel/bladder changes or saddle anesthesia. Also denies N/T down LE and pain does not go distal to knee. Has had a lot of issues with LE cramping lately, also is frustrated by her inability to walk or stand longer than 10 minutes.   PERTINENT HISTORY:  HTN, DM II, COPD  PAIN:  Are you having pain?  Yes: NPRS scale: 9/10 Pain location: lower back R>L, posterior hip R>L Pain description: sharp, tight, sore Aggravating factors: mornings, prolonged standing, walking Relieving factors: massage, medication  PRECAUTIONS: None  RED FLAGS: None   WEIGHT BEARING RESTRICTIONS: No  FALLS:  Has patient fallen in last 6 months? No  LIVING ENVIRONMENT: Lives with: lives alone Lives in: House/apartment Stairs: Yes: External: 12 steps; on right going up Has following equipment at home: Single point cane and Walker - 2 wheeled  OCCUPATION: Retired - Does [employer] a few times a week  PLOF: Independent  PATIENT GOALS: pt wants to decrease pain in her lower back to improve comfort with walking and shopping  NEXT MD VISIT: 03/01/2024  OBJECTIVE:  Note: Objective measures were completed at Evaluation unless otherwise noted.  DIAGNOSTIC FINDINGS:  See imaging  PATIENT SURVEYS:  ODI:  74% disability (37/50) 04/18/24: 30/50 (60% disability)  COGNITION: Overall cognitive status: Within functional limits for tasks assessed     SENSATION: [facility]  MUSCLE LENGTH: Thomas test: Right (+); Left (+)  POSTURE: rounded shoulders, forward head, and increased lumbar lordosis  PALPATION: TTP to bilateral lumbar paraspinals  LUMBAR ROM:   AROM eval  Flexion 50%  Extension 25%  Right lateral flexion   Left lateral flexion   Right rotation   Left rotation    (Blank rows = not tested)  LOWER EXTREMITY MMT:    MMT Right eval Left eval  Hip flexion 3+/5 3+/5  Hip extension    Hip abduction 3+/5 3+/5  Hip adduction    Hip internal rotation    Hip external rotation    Knee flexion 4/5 4/5  Knee extension 4/5 4/5  Ankle dorsiflexion    Ankle plantarflexion    Ankle inversion    Ankle eversion     (Blank rows = not tested)  LUMBAR SPECIAL TESTS:  Straight leg raise test: Negative and Slump test: Negative  FUNCTIONAL TESTS:  30 Second Sit to Stand: 8 reps 03/14/24: 6 reps  03/22/24: 7 reps 04/18/24: 8 reps   GAIT: Distance walked: 23ft Assistive device utilized: Single point cane Level of assistance: SBA Comments: trunk flexed, decreased gait speed  TREATMENT: [facility] Adult PT Treatment:                                                DATE: 04/18/24 Therapeutic Activity: Assessment of tests/measures, goals, and outcomes Modalities: Estim : pre mod 1 channel low back , channel 2 bilateral hips x 1 5 min  [facility] Adult PT Treatment:  DATE: 03/30/24 Therapeutic Exercise: Seated lumbar flexion with ball x 20 Single Long sitting h/s stretches on mat , x 3 bilateral  STS 10 x 2 , added 10#  Figure 4 stretch seated bilateral  Nustep   Modalities: Estim : pre mod 1 channel low back , channel 2 bilateral hips x 1 5 min  [facility] Adult PT Treatment:                                                DATE: 03/24/24 Therapeutic  Exercise: Seated h/s stretch 2x30" Seated figure 4 2x30" Repeated lumbar flexion with ball x 15 STS 2x10 Nustep L5 UE/LE x 4 minutes for functional activity tolerance Modalities: Estim: PREMOD x 15 premod to tolerance bilateral glute and proximal hamstrings  HMP: L hip in R sidelying concurrent with estim (removed at 10 minutes)  PATIENT EDUCATION:  Education details: continue HEP Person educated: Patient Education method: Explanation, Demonstration, and Handouts Education comprehension: verbalized understanding and returned demonstration  HOME EXERCISE PROGRAM: Access Code: [email or web address] URL: [email or web address] Date: 02/23/2024 Prepared by: Loral Roch  Exercises - Supine Lower Trunk Rotation  - 1-2 x daily - 7 x weekly - 2 sets - 10 reps - Supine Posterior Pelvic Tilt  - 1-2 x daily - 7 x weekly - 2 sets - 10 reps - 5 sec hold - Modified Thomas Stretch  - 1-2 x daily - 7 x weekly - 2 reps - 30 sec hold - Seated Figure 4 Piriformis Stretch  - 1-2 x daily - 7 x weekly - 2 reps - 30 sec hold - Sit to Stand  - 1-2 x daily - 7 x weekly - 3 sets - 10 reps - Seated Hamstring Stretch  - 1-2 x daily - 7 x weekly - 2 reps - 30 sec hold  ASSESSMENT:  CLINICAL IMPRESSION: Pt demonstrates slight improvement in ODI score and 30 Second Sit to Stand. Responded well to ESTIM noting decrease in pain. PT will extend and work on lumbar flexion based stretches and improve core/hip strength. Will continue to progress as able per POC.    EVAL: Patient is a 68 y.o. F who was seen today for physical therapy evaluation and treatment for chronic lower back pain and discomfort that radiates into bilateral LE;. Physical findings are consistent with referring provider impression as pt demonstrates decrease in core and hip strength as well as sharp decline in functional mobility. Oswestry score indicates severe disability in performance of home ADLs and higher level community activities.  Pt would benefit from skilled PT services working on improving core/hip strength in order to decrease pain.    OBJECTIVE IMPAIRMENTS: Abnormal gait, decreased activity tolerance, decreased balance, decreased endurance, decreased mobility, difficulty walking, decreased ROM, decreased strength, improper body mechanics, and pain   ACTIVITY LIMITATIONS: carrying, lifting, sitting, standing, squatting, stairs, transfers, reach over head, and locomotion level  PARTICIPATION LIMITATIONS: meal prep, cleaning, driving, shopping, community activity, occupation, and yard work  PERSONAL FACTORS: Time since onset of injury/illness/exacerbation and 3+ comorbidities: HTN, DM II, COPD are also affecting patient's functional outcome.   REHAB POTENTIAL: Good  CLINICAL DECISION MAKING: Evolving/moderate complexity  EVALUATION COMPLEXITY: Moderate   GOALS: Goals reviewed with patient? No  SHORT TERM GOALS: Target date: 03/15/2024   Pt will be compliant and knowledgeable with initial HEP for improved comfort and carryover Baseline:  initial HEP given  03/30/24: reports compliance  Goal status: MET  2.  Pt will self report low back and LE pain no greater than 7/10 for improved comfort and functional ability Baseline: 10/10 at worst    03/30/24: 10/10 at worst  Goal status: ONGOING   LONG TERM GOALS: Target date: 05/23/2024   Pt will be decrease ODI disability score to no greater than 50% (25/50) as proxy for functional improvement with home ADLs and community activites Baseline: 74% disability (37/50) 04/18/24: 30/50 (60% disability) Goal status: IN PROGRESS  2.  Pt will self report low back and LE pain no greater than 3/10 for improved comfort and functional ability Baseline: 10/10 at worst Goal status: IN PROGRESS   3.  Pt will increase 30 Second Sit to Stand rep count to no less than 10 reps for improved balance, strength, and functional mobility Baseline: 8 reps  03/14/24: 6 reps  03/22/24: 7  reps 04/18/24: 8 reps Goal status: IN PROGRESS   4.  Pt will be able to improve standing and walking activity tolerance to at least 20 minutes for improved functional ability with community navigation Baseline: 10 minutes Goal status: IN PROGRESS   PLAN:  PT FREQUENCY: 2x/week  PT DURATION: 8 weeks  PLANNED INTERVENTIONS: 97164- PT Re-evaluation, 97110-Therapeutic exercises, 97530- Therapeutic activity, [id number]- Neuromuscular re-education, 97535- Self Care, [id number]- Manual therapy, [id number]- Aquatic Therapy, [id number]- Electrical stimulation (unattended), [id number]- Electrical stimulation (manual), Dry Needling, Cryotherapy, and Moist heat.  PLAN FOR NEXT SESSION: Continue with therapeutic focus on Hamstring strength in full ROM, global BLE strength, dynamic core stability, an hip/lumbar mobility.   Ivor Mars PT  04/19/24 1:02 PM

## 2024-04-25 ENCOUNTER — Other Ambulatory Visit: Payer: Self-pay | Admitting: Physician Assistant

## 2024-04-25 DIAGNOSIS — I10 Essential (primary) hypertension: Secondary | ICD-10-CM

## 2024-04-27 ENCOUNTER — Encounter: Payer: Self-pay | Admitting: Physical Therapy

## 2024-04-27 ENCOUNTER — Ambulatory Visit: Admitting: Physical Therapy

## 2024-04-27 DIAGNOSIS — M6281 Muscle weakness (generalized): Secondary | ICD-10-CM | POA: Diagnosis not present

## 2024-04-27 DIAGNOSIS — M5459 Other low back pain: Secondary | ICD-10-CM

## 2024-04-27 DIAGNOSIS — R2689 Other abnormalities of gait and mobility: Secondary | ICD-10-CM | POA: Diagnosis not present

## 2024-04-27 NOTE — Therapy (Signed)
 OUTPATIENT PHYSICAL THERAPY TREATMENT   Patient Name: [name] MRN: [id number] DOB:1956-02-07, 68 y.o., female Today's Date: 04/27/2024  END OF SESSION:  PT End of Session - 04/27/24 1445     Visit Number 10    Number of Visits 17    Date for PT Re-Evaluation 05/23/24    Authorization Type [employer] Dual Complete    PT Start Time 0245    PT Stop Time 0338    PT Time Calculation (min) 53 min                 Past Medical History:  Diagnosis Date   Arthritis    Asthma    Colonic polyp    GERD (gastroesophageal reflux disease)    Hypertension    Obesity    Pain in limb    Smoker    Swelling of limb    Past Surgical History:  Procedure Laterality Date   BIOPSY BREAST Left 09/22/2018   no maglignancy   CATARACT EXTRACTION Right 05/25/2023   COLONOSCOPY  2009   Dr. Elvin Hammer   POLYPECTOMY     Patient Active Problem List   Diagnosis Date Noted   Myopathy, unspecified 07/15/2023   COPD (chronic obstructive pulmonary disease) (HCC) 11/30/2022   Stage 3a chronic kidney disease (HCC) 01/23/2021   History of cataract surgery 04/27/2020   Multiple somatic complaints 01/10/2019   Adenomatous polyp of colon 05/25/2018   Asthma, chronic, mild persistent, uncomplicated 03/06/2017   Chronic seasonal allergic rhinitis due to pollen 03/06/2017   Gastroesophageal reflux disease without esophagitis 10/10/2014   Ex-cigarette smoker 09/28/2013   Type 2 diabetes mellitus with complications (HCC) 07/12/2012   Obesity (BMI 30-39.9) 07/12/2012   Chronic back pain 07/12/2012   Hypertension associated with diabetes (HCC) 07/12/2012    PCP: Watson Hacking, MD  REFERRING PROVIDER: Claudene Crystal, PA-C   REFERRING DIAG: [phone] (ICD-10-CM) - Chronic low back pain, unspecified back pain laterality, unspecified whether sciatica present   Rationale for Evaluation and Treatment: Rehabilitation  THERAPY DIAG:  Other low back pain  Muscle weakness  (generalized)  Other abnormalities of gait and mobility  ONSET DATE: Chronic  SUBJECTIVE:                                                                                                                                                                                           SUBJECTIVE STATEMENT: Pt presents to PT with continued reports of pain and recent diagnosis of spinal stenosis on MRI. Pt has acquired a TENS unit and notes some relief with use at home. Wants to schedule a few more visits  since it has been a bit since she could get in.   EVAL: Pt presents to PT with reports of chronic LBP with refers into posterior LE, R>L. Pt is well known to clinic, has had past success to PT. Denies bowel/bladder changes or saddle anesthesia. Also denies N/T down LE and pain does not go distal to knee. Has had a lot of issues with LE cramping lately, also is frustrated by her inability to walk or stand longer than 10 minutes.   PERTINENT HISTORY:  HTN, DM II, COPD  PAIN:  Are you having pain?  Yes: NPRS scale: 9/10 Pain location: lower back R>L, posterior hip R>L Pain description: sharp, tight, sore Aggravating factors: mornings, prolonged standing, walking Relieving factors: massage, medication  PRECAUTIONS: None  RED FLAGS: None   WEIGHT BEARING RESTRICTIONS: No  FALLS:  Has patient fallen in last 6 months? No  LIVING ENVIRONMENT: Lives with: lives alone Lives in: House/apartment Stairs: Yes: External: 12 steps; on right going up Has following equipment at home: Single point cane and Walker - 2 wheeled  OCCUPATION: Retired - Does [employer] a few times a week  PLOF: Independent  PATIENT GOALS: pt wants to decrease pain in her lower back to improve comfort with walking and shopping  NEXT MD VISIT: 03/01/2024  OBJECTIVE:  Note: Objective measures were completed at Evaluation unless otherwise noted.  DIAGNOSTIC FINDINGS:  See imaging  PATIENT SURVEYS:  ODI:  74% disability (37/50) 04/18/24: 30/50 (60% disability)  COGNITION: Overall cognitive status: Within functional limits for tasks assessed     SENSATION: [facility]  MUSCLE LENGTH: Thomas test: Right (+); Left (+)  POSTURE: rounded shoulders, forward head, and increased lumbar lordosis  PALPATION: TTP to bilateral lumbar paraspinals  LUMBAR ROM:   AROM eval  Flexion 50%  Extension 25%  Right lateral flexion   Left lateral flexion   Right rotation   Left rotation    (Blank rows = not tested)  LOWER EXTREMITY MMT:    MMT Right eval Left eval  Hip flexion 3+/5 3+/5  Hip extension    Hip abduction 3+/5 3+/5  Hip adduction    Hip internal rotation    Hip external rotation    Knee flexion 4/5 4/5  Knee extension 4/5 4/5  Ankle dorsiflexion    Ankle plantarflexion    Ankle inversion    Ankle eversion     (Blank rows = not tested)  LUMBAR SPECIAL TESTS:  Straight leg raise test: Negative and Slump test: Negative  FUNCTIONAL TESTS:  30 Second Sit to Stand: 8 reps 03/14/24: 6 reps  03/22/24: 7 reps 04/18/24: 8 reps   GAIT: Distance walked: 29ft Assistive device utilized: Single point cane Level of assistance: SBA Comments: trunk flexed, decreased gait speed  TREATMENT: [facility] Adult PT Treatment:                                                DATE: 04/27/24  Nustep L5 6 minutes  Seated lumbar flexion x 10 with ball Supine LTR Supine h/s curls with feet on ball Supine marching  Childs pose rocking Qped Ab draw in x 10 Prone manual STW/compression to hamstrings bilat Prone alternating h/s curls  Seated h/s curls with foot prop of step 2 x 10 sec each  Seated figure 4 stretch    [facility] Adult PT Treatment:  DATE: 04/18/24 Therapeutic Activity: Assessment of tests/measures, goals, and outcomes Modalities: Estim : pre mod 1 channel low back , channel 2 bilateral hips x 1 5 min  [facility] Adult PT Treatment:                                                 DATE: 03/30/24 Therapeutic Exercise: Seated lumbar flexion with ball x 20 Single Long sitting h/s stretches on mat , x 3 bilateral  STS 10 x 2 , added 10#  Figure 4 stretch seated bilateral  Nustep   Modalities: Estim : pre mod 1 channel low back , channel 2 bilateral hips x 1 5 min  [facility] Adult PT Treatment:                                                DATE: 03/24/24 Therapeutic Exercise: Seated h/s stretch 2x30" Seated figure 4 2x30" Repeated lumbar flexion with ball x 15 STS 2x10 Nustep L5 UE/LE x 4 minutes for functional activity tolerance Modalities: Estim: PREMOD x 15 premod to tolerance bilateral glute and proximal hamstrings  HMP: L hip in R sidelying concurrent with estim (removed at 10 minutes)  PATIENT EDUCATION:  Education details: continue HEP Person educated: Patient Education method: Explanation, Demonstration, and Handouts Education comprehension: verbalized understanding and returned demonstration  HOME EXERCISE PROGRAM: Access Code: [email or web address] URL: [email or web address] Date: 02/23/2024 Prepared by: Loral Roch  Exercises - Supine Lower Trunk Rotation  - 1-2 x daily - 7 x weekly - 2 sets - 10 reps - Supine Posterior Pelvic Tilt  - 1-2 x daily - 7 x weekly - 2 sets - 10 reps - 5 sec hold - Modified Thomas Stretch  - 1-2 x daily - 7 x weekly - 2 reps - 30 sec hold - Seated Figure 4 Piriformis Stretch  - 1-2 x daily - 7 x weekly - 2 reps - 30 sec hold - Sit to Stand  - 1-2 x daily - 7 x weekly - 3 sets - 10 reps - Seated Hamstring Stretch  - 1-2 x daily - 7 x weekly - 2 reps - 30 sec hold  ASSESSMENT:  CLINICAL IMPRESSION: Pt reports lumbar and hamstring pain most prevalent today. Able to get into quadruped for Childs pose stretch which she reported felt good. Unable to get same results on bed at home. Manual compression provided to hamstring which pt reported decreased pain. Continued with flexion biased  /neutral strengthening as tolerated. Pt fatigues quickly. Requesting Estim at end of session. Should be able to get a few more sessions in during remaining POC. Will get lumbar injection June 3 so opted to cancel PT on June 4.  Is on waiting list.   Re-eval; Pt demonstrates slight improvement in ODI score and 30 Second Sit to Stand. Responded well to ESTIM noting decrease in pain. PT will extend and work on lumbar flexion based stretches and improve core/hip strength. Will continue to progress as able per POC.    EVAL: Patient is a 68 y.o. F who was seen today for physical therapy evaluation and treatment for chronic lower back pain and discomfort that radiates into bilateral LE;. Physical findings are consistent with referring provider impression as  pt demonstrates decrease in core and hip strength as well as sharp decline in functional mobility. Oswestry score indicates severe disability in performance of home ADLs and higher level community activities. Pt would benefit from skilled PT services working on improving core/hip strength in order to decrease pain.    OBJECTIVE IMPAIRMENTS: Abnormal gait, decreased activity tolerance, decreased balance, decreased endurance, decreased mobility, difficulty walking, decreased ROM, decreased strength, improper body mechanics, and pain   ACTIVITY LIMITATIONS: carrying, lifting, sitting, standing, squatting, stairs, transfers, reach over head, and locomotion level  PARTICIPATION LIMITATIONS: meal prep, cleaning, driving, shopping, community activity, occupation, and yard work  PERSONAL FACTORS: Time since onset of injury/illness/exacerbation and 3+ comorbidities: HTN, DM II, COPD are also affecting patient's functional outcome.   REHAB POTENTIAL: Good  CLINICAL DECISION MAKING: Evolving/moderate complexity  EVALUATION COMPLEXITY: Moderate   GOALS: Goals reviewed with patient? No  SHORT TERM GOALS: Target date: 03/15/2024   Pt will be compliant and  knowledgeable with initial HEP for improved comfort and carryover Baseline: initial HEP given  03/30/24: reports compliance  Goal status: MET  2.  Pt will self report low back and LE pain no greater than 7/10 for improved comfort and functional ability Baseline: 10/10 at worst    03/30/24: 10/10 at worst  Goal status: ONGOING   LONG TERM GOALS: Target date: 05/23/2024   Pt will be decrease ODI disability score to no greater than 50% (25/50) as proxy for functional improvement with home ADLs and community activites Baseline: 74% disability (37/50) 04/18/24: 30/50 (60% disability) Goal status: IN PROGRESS  2.  Pt will self report low back and LE pain no greater than 3/10 for improved comfort and functional ability Baseline: 10/10 at worst Goal status: IN PROGRESS   3.  Pt will increase 30 Second Sit to Stand rep count to no less than 10 reps for improved balance, strength, and functional mobility Baseline: 8 reps  03/14/24: 6 reps  03/22/24: 7 reps 04/18/24: 8 reps Goal status: IN PROGRESS   4.  Pt will be able to improve standing and walking activity tolerance to at least 20 minutes for improved functional ability with community navigation Baseline: 10 minutes Goal status: IN PROGRESS   PLAN:  PT FREQUENCY: 2x/week  PT DURATION: 8 weeks  PLANNED INTERVENTIONS: 97164- PT Re-evaluation, 97110-Therapeutic exercises, 97530- Therapeutic activity, [id number]- Neuromuscular re-education, 97535- Self Care, 54098- Manual therapy, [id number]- Aquatic Therapy, J1914- Electrical stimulation (unattended), [id number]- Electrical stimulation (manual), Dry Needling, Cryotherapy, and Moist heat.  PLAN FOR NEXT SESSION: Continue with therapeutic focus on Hamstring strength in full ROM, global BLE strength, dynamic core stability, an hip/lumbar mobility.   Susana Enter PTA  04/27/24 3:32 PM

## 2024-04-29 ENCOUNTER — Other Ambulatory Visit: Payer: Self-pay

## 2024-04-29 ENCOUNTER — Ambulatory Visit (INDEPENDENT_AMBULATORY_CARE_PROVIDER_SITE_OTHER): Admitting: Allergy

## 2024-04-29 ENCOUNTER — Encounter: Payer: Self-pay | Admitting: Allergy

## 2024-04-29 VITALS — BP 130/80 | HR 72 | Temp 98.3°F | Resp 19 | Ht 66.0 in | Wt 218.8 lb

## 2024-04-29 DIAGNOSIS — T7840XD Allergy, unspecified, subsequent encounter: Secondary | ICD-10-CM | POA: Diagnosis not present

## 2024-04-29 DIAGNOSIS — T783XXD Angioneurotic edema, subsequent encounter: Secondary | ICD-10-CM

## 2024-04-29 DIAGNOSIS — T7840XA Allergy, unspecified, initial encounter: Secondary | ICD-10-CM

## 2024-04-29 NOTE — Patient Instructions (Signed)
 Swelling - Facial swelling likely triggered by lisinopril . Lisinopril  discontinued. Advised to avoid all ACE inhibitors.  However concerned for other potential triggers like cashew, environmental allergens or hereditary swelling issue (sister also with facial swelling episode presumed due to lisinopril  use) - Will obtain labwork to evaluate other causes of swelling: nut allergy panel, environmental allergy panel and hereditary swelling panel - You have access to an EpiPen  for emergency use in case of allergic reaction.  Follow emergency action plan provided today if having an allergic reaction - Can take long-acting antihistamine like Zyrtec, Allegra or Xyzal as needed during day instead of Benadryl if needed.  These long-acting antihistamines have less potential for drowsiness like Benadryl does.  Follow-up pending lab results.  Will call you in about a week when lab results return.

## 2024-04-29 NOTE — Progress Notes (Signed)
 New Patient Note  RE: [name] MRN: [id number] DOB: [birth year] Date of Office Visit: 04/29/2024  Primary care provider: Watson Hacking, MD  Chief Complaint: swelling  History of present illness: [name] is a 68 y.o. female presenting today for evaluation of swelling.  Discussed the use of AI scribe software for clinical note transcription with the patient, who gave verbal consent to proceed.  She experienced a recent episode of facial swelling after consuming cashews. She awoke in the middle of the night with a tight feeling in her facial skin, which progressed to noticeable swelling. She had consumed dill pickle flavored cashews and later sea salt cashews, with the swelling occurring after the latter. Benadryl was taken, and the swelling subsided by the time she visited the doctor when this occurred on 03/29/24.  She has a history of similar swelling episodes dating back over thirty years, previously attributed to environmental allergens in her living environment, including exposure to roaches and other pests. These episodes included facial swelling, tongue swelling, and swollen fingertips, often requiring medical interventions with steroid injections. Relocation resolved these issues until the recent episode.  She has been on lisinopril  for several years, a medication her sister also took and experienced similar swelling reactions.  She did stop lisinopril  on 03/30/2019 after being advised to by a mobile clinic visit.   She has a history of spinal stenosis and experiences back pain, for which she receives injections and uses a TENS machine. She also takes Mounjaro for weight management and has been on metformin  for years, reporting significant weight loss recently.  Her social history includes volunteering with second graders.   Review of systems: 10pt ROS negative unless noted above in HPI  Past medical history: Past Medical History:  Diagnosis Date    Arthritis    Asthma    Colonic polyp    GERD (gastroesophageal reflux disease)    Hypertension    Obesity    Pain in limb    Smoker    Spinal stenosis    Swelling of limb     Past surgical history: Past Surgical History:  Procedure Laterality Date   BIOPSY BREAST Left 09/22/2018   no maglignancy   CATARACT EXTRACTION Right 05/25/2023   COLONOSCOPY  2009   Dr. Elvin Hammer   POLYPECTOMY      Family history:  Family History  Problem Relation Age of Onset   Lung cancer Father    Cirrhosis Mother    Cirrhosis Brother    Colon cancer Neg Hx    Colon polyps Neg Hx    Esophageal cancer Neg Hx    Rectal cancer Neg Hx    Stomach cancer Neg Hx     Social history:  Socioeconomic History   Marital status: Widowed  Tobacco Use   Smoking status: Former    Current packs/day: 0.25    Average packs/day: 0.3 packs/day for 10.0 years (2.5 ttl pk-yrs)    Types: Cigarettes   Smokeless tobacco: Never   Tobacco comments:    quit oct 2018  Vaping Use   Vaping status: Never Used   Medication List: Current Outpatient Medications  Medication Sig Dispense Refill   acetaminophen  (TYLENOL ) 500 MG tablet Take 1,000 mg by mouth every 6 (six) hours as needed for mild pain (pain score 1-3).     Blood Glucose Monitoring Suppl (ACCU-CHEK AVIVA PLUS) w/Device KIT USE AS DIRECTED TO CHECK BLOOD GLUCOSE 1 TO 2 TIMES DAILY 1 kit 0  diphenhydramine-acetaminophen  (TYLENOL  PM) 25-500 MG TABS tablet Take 1 tablet by mouth at bedtime as needed (sleep).     doxycycline  (VIBRA -TABS) 100 MG tablet Take 1 tablet (100 mg total) by mouth 2 (two) times daily. 10 tablet 0   EPINEPHrine  0.3 mg/0.3 mL IJ SOAJ injection Inject 0.3 mg into the muscle as needed for anaphylaxis. 1 each 0   Fluticasone -Umeclidin-Vilant (TRELEGY ELLIPTA ) 200-62.5-25 MCG/ACT AEPB Take 1 Inhalation by mouth daily. 60 each 5   furosemide  (LASIX ) 20 MG tablet Take 1 tablet (20 mg total) by mouth daily. 30 tablet 3   hydrochlorothiazide   (HYDRODIURIL ) 12.5 MG tablet Take 1 tablet (12.5 mg total) by mouth daily. 30 tablet 1   ibuprofen  (ADVIL ) 800 MG tablet TAKE 1 TABLET BY MOUTH EVERY 8 HOURS AS NEEDED FOR MILD PAIN 90 tablet 0   levalbuterol  (XOPENEX  HFA) 45 MCG/ACT inhaler INHALE 2 PUFFS BY MOUTH EVERY 4 HOURS AS NEEDED FOR WHEEZING 15 g 0   losartan  (COZAAR ) 25 MG tablet Take 1 tablet by mouth once daily 90 tablet 1   metFORMIN  (GLUCOPHAGE ) 500 MG tablet Take 1 tablet (500 mg total) by mouth 2 (two) times daily with a meal. 180 tablet 1   methocarbamol  (ROBAXIN ) 500 MG tablet Take 1 tablet (500 mg total) by mouth 3 (three) times daily. 90 tablet 0   montelukast  (SINGULAIR ) 10 MG tablet Take 1 tablet (10 mg total) by mouth at bedtime. 30 tablet 0   ondansetron  (ZOFRAN -ODT) 4 MG disintegrating tablet Take 1 tablet (4 mg total) by mouth every 8 (eight) hours as needed for nausea or vomiting. 10 tablet 0   pantoprazole  (PROTONIX ) 40 MG tablet Take 1 tablet (40 mg total) by mouth daily. 90 tablet 1   tirzepatide (MOUNJARO) 2.5 MG/0.5ML Pen Inject 2.5 mg into the skin once a week. 2 mL 0   tirzepatide (MOUNJARO) 5 MG/0.5ML Pen Inject 5 mg into the skin once a week. 6 mL 0   traMADol  (ULTRAM ) 50 MG tablet Take 1 tablet (50 mg total) by mouth 2 (two) times daily as needed. 20 tablet 0   No current facility-administered medications for this visit.    Known medication allergies: Allergies  Allergen Reactions   Lisinopril  Swelling     Physical examination: Blood pressure 130/80, pulse 72, temperature 98.3 F (36.8 C), temperature source Temporal, resp. rate 19, height 5\' 6"  (1.676 m), weight 218 lb 12.8 oz (99.2 kg), SpO2 95%.  General: Alert, interactive, in no acute distress. HEENT: PERRLA, TMs pearly gray, turbinates non-edematous without discharge, post-pharynx non erythematous. Neck: Supple without lymphadenopathy. Lungs: Clear to auscultation without wheezing, rhonchi or rales. {no increased work of breathing. CV: Normal  S1, S2 without murmurs. Abdomen: Nondistended, nontender. Skin: Warm and dry, without lesions or rashes. Extremities:  No clubbing, cyanosis or edema. Neuro:   Grossly intact.  Diagnostics/Labs: None today   Assessment and plan: Angioedema - Facial swelling likely triggered by lisinopril . Lisinopril  discontinued. Advised to avoid all ACE inhibitors.  However concerned for other potential triggers like cashew, environmental allergens or hereditary swelling issue (sister also with facial swelling episode presumed due to lisinopril  use) - Will obtain labwork to evaluate other causes of swelling: nut allergy panel, environmental allergy panel and hereditary swelling panel - You have access to an EpiPen  for emergency use in case of allergic reaction.  Follow emergency action plan provided today if having an allergic reaction - Can take long-acting antihistamine like Zyrtec, Allegra or Xyzal as needed during day instead of  Benadryl if needed.  These long-acting antihistamines have less potential for drowsiness like Benadryl does.  Follow-up pending lab results.  Will call you in about a week when lab results return.    I appreciate the opportunity to take part in [name]'s care. Please do not hesitate to contact me with questions.  Sincerely,   Catha Clink, MD Allergy/Immunology [facility]

## 2024-05-03 ENCOUNTER — Other Ambulatory Visit: Payer: Self-pay

## 2024-05-03 ENCOUNTER — Ambulatory Visit (INDEPENDENT_AMBULATORY_CARE_PROVIDER_SITE_OTHER): Admitting: Physical Medicine and Rehabilitation

## 2024-05-03 VITALS — BP 149/72 | HR 73

## 2024-05-03 DIAGNOSIS — M5416 Radiculopathy, lumbar region: Secondary | ICD-10-CM | POA: Diagnosis not present

## 2024-05-03 DIAGNOSIS — M48062 Spinal stenosis, lumbar region with neurogenic claudication: Secondary | ICD-10-CM

## 2024-05-03 MED ORDER — METHYLPREDNISOLONE ACETATE 40 MG/ML IJ SUSP
40.0000 mg | Freq: Once | INTRAMUSCULAR | Status: AC
  Administered 2024-05-03: 40 mg

## 2024-05-03 NOTE — Patient Instructions (Signed)

## 2024-05-03 NOTE — Progress Notes (Unsigned)
 [name] - 68 y.o. female MRN [id number]  Date of birth: June 21, 1956  Office Visit Note: Visit Date: 05/03/2024 PCP: Watson Hacking, MD Referred by: Watson Hacking, MD  Subjective: Chief Complaint  Patient presents with   Lower Back - Pain   HPI:  [name] is a 68 y.o. female who comes in today at the request of Elvan Hamel, FNP for planned Right L4-5 Lumbar Interlaminar epidural steroid injection with fluoroscopic guidance.  The patient has failed conservative care including home exercise, medications, time and activity modification.  This injection will be diagnostic and hopefully therapeutic.  Please see requesting physician notes for further details and justification.   ROS Otherwise per HPI.  Assessment & Plan: Visit Diagnoses:    ICD-10-CM   1. Radiculopathy, lumbar region  M54.16 XR C-ARM NO REPORT    Epidural Steroid injection    methylPREDNISolone  acetate (DEPO-MEDROL ) injection 40 mg    2. Spinal stenosis of lumbar region with neurogenic claudication  M48.062 XR C-ARM NO REPORT    Epidural Steroid injection    methylPREDNISolone  acetate (DEPO-MEDROL ) injection 40 mg      Plan: No additional findings.   Meds & Orders:  Meds ordered this encounter  Medications   methylPREDNISolone  acetate (DEPO-MEDROL ) injection 40 mg    Orders Placed This Encounter  Procedures   XR C-ARM NO REPORT   Epidural Steroid injection    Follow-up: Return if symptoms worsen or fail to improve.   Procedures: No procedures performed  Lumbar Epidural Steroid Injection - Interlaminar Approach with Fluoroscopic Guidance  Patient: [name]      Date of Birth: 1956/07/05 MRN: [id number] PCP: Watson Hacking, MD      Visit Date: 05/03/2024   Universal Protocol:     Consent Given By: the patient  Position: PRONE  Additional Comments: Vital signs were monitored before and after the procedure. Patient was prepped and draped in the usual  sterile fashion. The correct patient, procedure, and site was verified.   Injection Procedure Details:   Procedure diagnoses: Radiculopathy, lumbar region [M54.16]   Meds Administered:  Meds ordered this encounter  Medications   methylPREDNISolone  acetate (DEPO-MEDROL ) injection 40 mg     Laterality: Right  Location/Site:  L4-5  Needle: 4.5 in., 20 ga. Tuohy  Needle Placement: Paramedian epidural  Findings:   -Comments: Excellent flow of contrast into the epidural space.  Procedure Details: Using a paramedian approach from the side mentioned above, the region overlying the inferior lamina was localized under fluoroscopic visualization and the soft tissues overlying this structure were infiltrated with 4 ml. of 1% Lidocaine without Epinephrine . The Tuohy needle was inserted into the epidural space using a paramedian approach.   The epidural space was localized using loss of resistance along with counter oblique bi-planar fluoroscopic views.  After negative aspirate for air, blood, and CSF, a 2 ml. volume of Isovue-250 was injected into the epidural space and the flow of contrast was observed. Radiographs were obtained for documentation purposes.    The injectate was administered into the level noted above.   Additional Comments:  The patient tolerated the procedure well Dressing: 2 x 2 sterile gauze and Band-Aid    Post-procedure details: Patient was observed during the procedure. Post-procedure instructions were reviewed.  Patient left the clinic in stable condition.   Clinical History: CLINICAL DATA:  Low back pain   EXAM: MRI LUMBAR SPINE WITHOUT CONTRAST   TECHNIQUE: Multiplanar, multisequence MR  imaging of the lumbar spine was performed. No intravenous contrast was administered.   COMPARISON:  MRI of the lumbar spine dated 01/20/2021   FINDINGS: Segmentation: Standard.   Alignment:  Grade 1 anterolisthesis of L4 on L5.   Vertebrae: Vertebral bodies  demonstrate normal signal intensity. No compression fractures.   Conus medullaris and cauda equina: The conus medullaris terminates at the level of L1-L2. The distal spinal cord signal intensity is normal.   Paraspinal and other soft tissues: The visualized abdomen and pelvis show no soft tissue abnormality. The visualized aorta is normal.   Disc levels:   L1-L2: Disc is normal in configuration. Mild bilateral facet arthropathy. No neuroforaminal stenosis. No spinal canal stenosis.   L2-L3: Disc is normal in configuration. Mild bilateral facet arthropathy. No neuroforaminal stenosis. No spinal canal stenosis.   L3-L4: Disc is normal in configuration. Mild bilateral facet arthropathy. No neuroforaminal stenosis. No spinal canal stenosis.   L4-L5: Disc bulge. Severe bilateral facet arthropathy. Severe bilateral neuroforaminal stenosis. Moderate to severe spinal canal stenosis.   L5-S1: Disc bulge. Severe bilateral facet arthropathy. Severe bilateral neuroforaminal stenosis. Mild spinal canal stenosis.   IMPRESSION: 1. Moderate to severe canal stenosis at L4-L5 secondary to disc bulging and facet arthropathy. 2. Severe bilateral foraminal stenosis at L4-L5 and L5-S secondary to disc bulging and facet arthropathy.     Electronically Signed   By: Johnanna Mylar M.D.   On: 04/05/2024 09:55     Objective:  VS:  HT:    WT:   BMI:     BP:(!) 149/72  HR:73bpm  TEMP: ( )  RESP:  Physical Exam Vitals and nursing note reviewed.  Constitutional:      General: She is not in acute distress.    Appearance: Normal appearance. She is well-developed. She is obese. She is not ill-appearing.  HENT:     Head: Normocephalic and atraumatic.     Right Ear: External ear normal.     Left Ear: External ear normal.  Eyes:     Extraocular Movements: Extraocular movements intact.     Conjunctiva/sclera: Conjunctivae normal.     Pupils: Pupils are equal, round, and reactive to light.   Cardiovascular:     Rate and Rhythm: Normal rate.     Pulses: Normal pulses.  Pulmonary:     Effort: Pulmonary effort is normal. No respiratory distress.  Abdominal:     General: There is no distension.     Palpations: Abdomen is soft.  Musculoskeletal:        General: Tenderness present.     Cervical back: Neck supple.     Right lower leg: No edema.     Left lower leg: No edema.     Comments: Patient has good distal strength with no pain over the greater trochanters.  No clonus or focal weakness.  Skin:    General: Skin is warm and dry.     Findings: No erythema, lesion or rash.  Neurological:     General: No focal deficit present.     Mental Status: She is alert and oriented to person, place, and time.     Cranial Nerves: No cranial nerve deficit.     Sensory: No sensory deficit.     Motor: No weakness or abnormal muscle tone.     Coordination: Coordination normal.     Gait: Gait abnormal.  Psychiatric:        Mood and Affect: Mood normal.        Behavior:  Behavior normal.      Imaging: No results found.

## 2024-05-03 NOTE — Procedures (Unsigned)
 Lumbar Epidural Steroid Injection - Interlaminar Approach with Fluoroscopic Guidance  Patient: [name]      Date of Birth: 09/06/1956 MRN: [id number] PCP: Watson Hacking, MD      Visit Date: 05/03/2024   Universal Protocol:     Consent Given By: the patient  Position: PRONE  Additional Comments: Vital signs were monitored before and after the procedure. Patient was prepped and draped in the usual sterile fashion. The correct patient, procedure, and site was verified.   Injection Procedure Details:   Procedure diagnoses: Radiculopathy, lumbar region [M54.16]   Meds Administered:  Meds ordered this encounter  Medications   methylPREDNISolone  acetate (DEPO-MEDROL ) injection 40 mg     Laterality: Right  Location/Site:  L4-5  Needle: 4.5 in., 20 ga. Tuohy  Needle Placement: Paramedian epidural  Findings:   -Comments: Excellent flow of contrast into the epidural space.  Procedure Details: Using a paramedian approach from the side mentioned above, the region overlying the inferior lamina was localized under fluoroscopic visualization and the soft tissues overlying this structure were infiltrated with 4 ml. of 1% Lidocaine without Epinephrine . The Tuohy needle was inserted into the epidural space using a paramedian approach.   The epidural space was localized using loss of resistance along with counter oblique bi-planar fluoroscopic views.  After negative aspirate for air, blood, and CSF, a 2 ml. volume of Isovue-250 was injected into the epidural space and the flow of contrast was observed. Radiographs were obtained for documentation purposes.    The injectate was administered into the level noted above.   Additional Comments:  The patient tolerated the procedure well Dressing: 2 x 2 sterile gauze and Band-Aid    Post-procedure details: Patient was observed during the procedure. Post-procedure instructions were reviewed.  Patient left the clinic in  stable condition.

## 2024-05-03 NOTE — Progress Notes (Unsigned)
 Pain Scale   Average Pain 8 Patient advised she has chronic lower back pain radiating to right side of buttocks.         +Driver, -BT, -Dye Allergies.

## 2024-05-04 ENCOUNTER — Ambulatory Visit: Admitting: Physical Therapy

## 2024-05-04 ENCOUNTER — Telehealth: Payer: Self-pay | Admitting: Physical Medicine and Rehabilitation

## 2024-05-04 NOTE — Telephone Encounter (Signed)
 Patient called and said she has questions about the shot she got yesterday. CB#[phone]

## 2024-05-05 ENCOUNTER — Encounter: Payer: Self-pay | Admitting: Physical Therapy

## 2024-05-07 LAB — ALLERGENS W/TOTAL IGE AREA 2
Alternaria Alternata IgE: <0.1 kU/L
Aspergillus Fumigatus IgE: <0.1 kU/L
Bermuda Grass IgE: <0.1 kU/L
Cat Dander IgE: 0.12 kU/L — AB
Cedar, Mountain IgE: <0.1 kU/L
Cladosporium Herbarum IgE: <0.1 kU/L
Cockroach, German IgE: <0.1 kU/L
Common Silver Birch IgE: <0.1 kU/L
Cottonwood IgE: <0.1 kU/L
D Farinae IgE: 0.32 kU/L — AB
D Pteronyssinus IgE: 0.46 kU/L — AB
Dog Dander IgE: <0.1 kU/L
Elm, American IgE: <0.1 kU/L
IgE (Immunoglobulin E), Serum: 135 [IU]/mL (ref 6–495)
Johnson Grass IgE: <0.1 kU/L
Maple/Box Elder IgE: <0.1 kU/L
Mouse Urine IgE: <0.1 kU/L
Oak, White IgE: <0.1 kU/L
Pecan, Hickory IgE: <0.1 kU/L
Penicillium Chrysogen IgE: <0.1 kU/L
Pigweed, Rough IgE: <0.1 kU/L
Ragweed, Short IgE: <0.1 kU/L
Sheep Sorrel IgE Qn: <0.1 kU/L
Timothy Grass IgE: <0.1 kU/L
White Mulberry IgE: <0.1 kU/L

## 2024-05-07 LAB — C1 ESTERASE INHIBITOR: C1INH SerPl-mCnc: 35 mg/dL (ref 21–39)

## 2024-05-07 LAB — COMPLEMENT COMPONENT C1Q: Complement C1Q: 16.2 mg/dL (ref 10.3–20.5)

## 2024-05-07 LAB — IGE NUT PROF. W/COMPONENT RFLX
F017-IgE Hazelnut (Filbert): <0.1 kU/L
F018-IgE Brazil Nut: <0.1 kU/L
F020-IgE Almond: <0.1 kU/L
F202-IgE Cashew Nut: <0.1 kU/L
F203-IgE Pistachio Nut: <0.1 kU/L
F256-IgE Walnut: <0.1 kU/L
Macadamia Nut, IgE: <0.1 kU/L
Peanut, IgE: <0.1 kU/L
Pecan Nut IgE: <0.1 kU/L

## 2024-05-07 LAB — TRYPTASE: Tryptase: 6.1 ug/L (ref 2.2–13.2)

## 2024-05-07 LAB — C1 ESTERASE INHIBITOR, FUNCTIONAL: C1INH Functional/C1INH Total MFr SerPl: >105 %{normal}

## 2024-05-07 LAB — C4 COMPLEMENT: Complement C4, Serum: 36 mg/dL (ref 12–38)

## 2024-05-12 ENCOUNTER — Telehealth: Payer: Self-pay | Admitting: Family Medicine

## 2024-05-12 NOTE — Telephone Encounter (Signed)
 Pt requesting medication ipratropium-albuterol  (DUONEB) 0.5-2.5 (3) MG/3ML nebulizer solution 3 mL not listed under current meds.

## 2024-05-12 NOTE — Telephone Encounter (Unsigned)
 Copied from CRM [id number]. Topic: Clinical - Medication Refill >> May 12, 2024  9:46 AM Kevelyn M wrote: Medication: ipratropium-albuterol  (DUONEB) 0.5-2.5 (3) MG/3ML nebulizer solution 3 mL   Has the patient contacted their pharmacy? No (Agent: If no, request that the patient contact the pharmacy for the refill. If patient does not wish to contact the pharmacy document the reason why and proceed with request.) (Agent: If yes, when and what did the pharmacy advise?)  This is the patient's preferred pharmacy:  [id number] - [place] ([place] - [address] [place] ([place]) [place] [address] Phone: [phone] Fax: [phone]   Is this the correct pharmacy for this prescription? Yes If no, delete pharmacy and type the correct one.   Has the prescription been filled recently? Yes  Is the patient out of the medication? No  Has the patient been seen for an appointment in the last year OR does the patient have an upcoming appointment? Yes  Can we respond through MyChart? Yes  Agent: Please be advised that Rx refills may take up to 3 business days. We ask that you follow-up with your pharmacy.

## 2024-05-13 ENCOUNTER — Ambulatory Visit: Payer: Self-pay | Admitting: Allergy

## 2024-05-16 ENCOUNTER — Other Ambulatory Visit: Payer: Self-pay | Admitting: Medical

## 2024-05-16 NOTE — Telephone Encounter (Signed)
 Patient will wait until her virtual visit this week as she has 3 pens left

## 2024-05-17 ENCOUNTER — Ambulatory Visit: Payer: Self-pay

## 2024-05-18 ENCOUNTER — Telehealth: Admitting: Family Medicine

## 2024-05-18 DIAGNOSIS — Z7985 Long-term (current) use of injectable non-insulin antidiabetic drugs: Secondary | ICD-10-CM | POA: Diagnosis not present

## 2024-05-18 DIAGNOSIS — E118 Type 2 diabetes mellitus with unspecified complications: Secondary | ICD-10-CM

## 2024-05-18 DIAGNOSIS — J069 Acute upper respiratory infection, unspecified: Secondary | ICD-10-CM

## 2024-05-18 DIAGNOSIS — J441 Chronic obstructive pulmonary disease with (acute) exacerbation: Secondary | ICD-10-CM | POA: Diagnosis not present

## 2024-05-18 MED ORDER — TIRZEPATIDE 5 MG/0.5ML ~~LOC~~ SOAJ
5.0000 mg | SUBCUTANEOUS | 0 refills | Status: DC
Start: 2024-05-18 — End: 2024-06-28

## 2024-05-18 MED ORDER — ALBUTEROL SULFATE (2.5 MG/3ML) 0.083% IN NEBU: 2.5000 mg | INHALATION_SOLUTION | Freq: Four times a day (QID) | RESPIRATORY_TRACT | 1 refills | Status: AC | PRN

## 2024-05-18 NOTE — Progress Notes (Signed)
   Subjective:    Patient ID: Vale Garrison, female    DOB: 03/31/1956, 68 y.o.   MRN: [id number]  HPI Documentation for virtual audio and video telecommunications through Caregility encounter:  The patient was located at home. 2 patient identifiers used.  The provider was located in the office. The patient did consent to this visit and is aware of possible charges through their insurance for this visit.  The other persons participating in this telemedicine service were none. Time spent on call was 5 minutes and in review of previous records >20 minutes total for counseling and coordination of care. This virtual service is not related to other E/M service within previous 7 days.  Today's visit is for multiple concerns.  She would like her Mounjaro  at 5 mg.  She has lost over 40 pounds and is very happy.  She would like her inhalers to be nebulized.  I explained that the Trelegy at least by my system does not have an nebulized variety.  She also complains of a 2-week history of chest and nasal congestion and cough that is now productive but no fever, chills, sore throat or earache.  She wants an antibiotic.  She has enough Trelegy and states that she will try and have the company send information concerning the nebulizer.  Review of Systems     Objective:    Physical Exam Alert and in no distress otherwise not examined       Assessment & Plan:  Type 2 diabetes mellitus with complications (HCC) - Plan: tirzepatide  (MOUNJARO ) 5 MG/0.5ML Pen  COPD with acute exacerbation (HCC) - Plan: albuterol  (PROVENTIL ) (2.5 MG/3ML) 0.083% nebulizer solution  URI with cough and congestion I explained that I do not think an antibiotic would be appropriate at the present time and recommend continuing with her inhalers and I will give her the Mounjaro  to 5 mg.  She is to call me in 1 week and let me know how she is doing.

## 2024-05-19 ENCOUNTER — Ambulatory Visit: Payer: Self-pay | Attending: Medical | Admitting: Physical Therapy

## 2024-05-19 DIAGNOSIS — M5459 Other low back pain: Secondary | ICD-10-CM | POA: Insufficient documentation

## 2024-05-19 DIAGNOSIS — M6281 Muscle weakness (generalized): Secondary | ICD-10-CM | POA: Insufficient documentation

## 2024-05-19 DIAGNOSIS — R2689 Other abnormalities of gait and mobility: Secondary | ICD-10-CM | POA: Insufficient documentation

## 2024-05-19 NOTE — Patient Instructions (Signed)

## 2024-05-19 NOTE — Therapy (Signed)
 OUTPATIENT PHYSICAL THERAPY TREATMENT   Patient Name: [name] MRN: [id number] DOB:Apr 27, 1956, 68 y.o., female Today's Date: 05/19/2024  END OF SESSION:  PT End of Session - 05/19/24 1146     Visit Number 11    Number of Visits 23    Date for PT Re-Evaluation 06/30/24    Authorization Type [employer] Dual Complete    PT Start Time 1145    PT Stop Time 1235    PT Time Calculation (min) 50 min              Past Medical History:  Diagnosis Date   Arthritis    Asthma    Colonic polyp    GERD (gastroesophageal reflux disease)    Hypertension    Obesity    Pain in limb    Smoker    Spinal stenosis    Swelling of limb    Past Surgical History:  Procedure Laterality Date   BIOPSY BREAST Left 09/22/2018   no maglignancy   CATARACT EXTRACTION Right 05/25/2023   COLONOSCOPY  2009   Dr. Elvin Hammer   POLYPECTOMY     Patient Active Problem List   Diagnosis Date Noted   Myopathy, unspecified 07/15/2023   COPD (chronic obstructive pulmonary disease) (HCC) 11/30/2022   Stage 3a chronic kidney disease (HCC) 01/23/2021   History of cataract surgery 04/27/2020   Multiple somatic complaints 01/10/2019   Adenomatous polyp of colon 05/25/2018   Asthma, chronic, mild persistent, uncomplicated 03/06/2017   Chronic seasonal allergic rhinitis due to pollen 03/06/2017   Gastroesophageal reflux disease without esophagitis 10/10/2014   Ex-cigarette smoker 09/28/2013   Type 2 diabetes mellitus with complications (HCC) 07/12/2012   Obesity (BMI 30-39.9) 07/12/2012   Chronic back pain 07/12/2012   Hypertension associated with diabetes (HCC) 07/12/2012    PCP: Watson Hacking, MD  REFERRING PROVIDER: Claudene Crystal, PA-C   REFERRING DIAG: [phone] (ICD-10-CM) - Chronic low back pain, unspecified back pain laterality, unspecified whether sciatica present   Rationale for Evaluation and Treatment: Rehabilitation  THERAPY DIAG:  Other low back pain  Muscle weakness  (generalized)  Other abnormalities of gait and mobility  ONSET DATE: Chronic  SUBJECTIVE:                                                                                                                                                                                           SUBJECTIVE STATEMENT: This is the second week since the injection. It helped some. Still have back pain and gluteal folds. 8/10 low back pain.   EVAL: Pt presents to PT with reports of chronic LBP with refers  into posterior LE, R>L. Pt is well known to clinic, has had past success to PT. Denies bowel/bladder changes or saddle anesthesia. Also denies N/T down LE and pain does not go distal to knee. Has had a lot of issues with LE cramping lately, also is frustrated by her inability to walk or stand longer than 10 minutes.   PERTINENT HISTORY:  HTN, DM II, COPD  PAIN:  Are you having pain?  Yes: NPRS scale: 9/10 Pain location: lower back R>L, posterior hip R>L Pain description: sharp, tight, sore Aggravating factors: mornings, prolonged standing, walking Relieving factors: massage, medication  PRECAUTIONS: None  RED FLAGS: None   WEIGHT BEARING RESTRICTIONS: No  FALLS:  Has patient fallen in last 6 months? No  LIVING ENVIRONMENT: Lives with: lives alone Lives in: House/apartment Stairs: Yes: External: 12 steps; on right going up Has following equipment at home: Single point cane and Walker - 2 wheeled  OCCUPATION: Retired - Does [employer] a few times a week  PLOF: Independent  PATIENT GOALS: pt wants to decrease pain in her lower back to improve comfort with walking and shopping  NEXT MD VISIT: 03/01/2024  OBJECTIVE:  Note: Objective measures were completed at Evaluation unless otherwise noted.  DIAGNOSTIC FINDINGS:  See imaging  PATIENT SURVEYS:  ODI: 74% disability (37/50) 04/18/24: 30/50 (60% disability)  COGNITION: Overall cognitive status: Within functional limits for  tasks assessed     SENSATION: [facility]  MUSCLE LENGTH: Thomas test: Right (+); Left (+)  POSTURE: rounded shoulders, forward head, and increased lumbar lordosis  PALPATION: TTP to bilateral lumbar paraspinals  LUMBAR ROM:   AROM eval  Flexion 50%  Extension 25%  Right lateral flexion   Left lateral flexion   Right rotation   Left rotation    (Blank rows = not tested)  LOWER EXTREMITY MMT:    MMT Right eval Left eval  Hip flexion 3+/5 3+/5  Hip extension    Hip abduction 3+/5 3+/5  Hip adduction    Hip internal rotation    Hip external rotation    Knee flexion 4/5 4/5  Knee extension 4/5 4/5  Ankle dorsiflexion    Ankle plantarflexion    Ankle inversion    Ankle eversion     (Blank rows = not tested)  LUMBAR SPECIAL TESTS:  Straight leg raise test: Negative and Slump test: Negative  FUNCTIONAL TESTS:  30 Second Sit to Stand: 8 reps 03/14/24: 6 reps  03/22/24: 7 reps 04/18/24: 8 reps   GAIT: Distance walked: 31ft Assistive device utilized: Single point cane Level of assistance: SBA Comments: trunk flexed, decreased gait speed  TREATMENT: [facility] Adult PT Treatment:                                                DATE: 05/19/24 Therapeutic Exercise: Nustep L5 x 10 min for activty tolerance  Seated lumbar flexion with ball and laterals Seated h/s stretches  Qped Childs pose rocking forward and laterals  Cat /camel Seated upper trunk rotations  Therapeutic Activity: Plan of care: missed several weeks due to injections and conflict. Plan a trial of aquatic therapy x 3 and extend POC, then probable DC to HEP for land and aquatics Modalities: Estim : pre mod 1 channel low back , channel 2 bilateral hips x 1 5 min     [facility] Adult PT  Treatment:                                                DATE: 04/27/24  Nustep L5 6 minutes  Seated lumbar flexion x 10 with ball Supine LTR Supine h/s curls with feet on ball Supine marching  Childs pose rocking Qped Ab draw  in x 10 Prone manual STW/compression to hamstrings bilat Prone alternating h/s curls  Seated h/s curls with foot prop of step 2 x 10 sec each  Seated figure 4 stretch    [facility] Adult PT Treatment:                                                DATE: 04/18/24 Therapeutic Activity: Assessment of tests/measures, goals, and outcomes Modalities: Estim : pre mod 1 channel low back , channel 2 bilateral hips x 1 5 min  [facility] Adult PT Treatment:                                                DATE: 03/30/24 Therapeutic Exercise: Seated lumbar flexion with ball x 20 Single Long sitting h/s stretches on mat , x 3 bilateral  STS 10 x 2 , added 10#  Figure 4 stretch seated bilateral  Nustep   Modalities: Estim : pre mod 1 channel low back , channel 2 bilateral hips x 1 5 min  [facility] Adult PT Treatment:                                                DATE: 03/24/24 Therapeutic Exercise: Seated h/s stretch 2x30 Seated figure 4 2x30 Repeated lumbar flexion with ball x 15 STS 2x10 Nustep L5 UE/LE x 4 minutes for functional activity tolerance Modalities: Estim: PREMOD x 15 premod to tolerance bilateral glute and proximal hamstrings  HMP: L hip in R sidelying concurrent with estim (removed at 10 minutes)  PATIENT EDUCATION:  Education details: continue HEP Person educated: Patient Education method: Explanation, Demonstration, and Handouts Education comprehension: verbalized understanding and returned demonstration  HOME EXERCISE PROGRAM: Access Code: [email or web address] URL: [email or web address] Date: 02/23/2024 Prepared by: Loral Roch  Exercises - Supine Lower Trunk Rotation  - 1-2 x daily - 7 x weekly - 2 sets - 10 reps - Supine Posterior Pelvic Tilt  - 1-2 x daily - 7 x weekly - 2 sets - 10 reps - 5 sec hold - Modified Thomas Stretch  - 1-2 x daily - 7 x weekly - 2 reps - 30 sec hold - Seated Figure 4 Piriformis Stretch  - 1-2 x daily - 7 x weekly - 2 reps - 30 sec hold -  Sit to Stand  - 1-2 x daily - 7 x weekly - 3 sets - 10 reps - Seated Hamstring Stretch  - 1-2 x daily - 7 x weekly - 2 reps - 30 sec hold  ASSESSMENT:  CLINICAL IMPRESSION: Pt reports lumbar epidural injections 2 weeks ago with minimal improvement  this far. Unable to get in from the wait list and has only completed one treatment in current POC extension. Pain today is 8/10 low back and bilateral hamstrings. Continued with flexion biased /neutral strengthening as tolerated.  Requesting Estim at end of session. Will request extension of POC to complete a trial of aquatic therapy and get an aquatic HEP so she can [facility].    Re-eval; Pt demonstrates slight improvement in ODI score and 30 Second Sit to Stand. Responded well to ESTIM noting decrease in pain. PT will extend and work on lumbar flexion based stretches and improve core/hip strength. Will continue to progress as able per POC.    EVAL: Patient is a 68 y.o. F who was seen today for physical therapy evaluation and treatment for chronic lower back pain and discomfort that radiates into bilateral LE;. Physical findings are consistent with referring provider impression as pt demonstrates decrease in core and hip strength as well as sharp decline in functional mobility. Oswestry score indicates severe disability in performance of home ADLs and higher level community activities. Pt would benefit from skilled PT services working on improving core/hip strength in order to decrease pain.    OBJECTIVE IMPAIRMENTS: Abnormal gait, decreased activity tolerance, decreased balance, decreased endurance, decreased mobility, difficulty walking, decreased ROM, decreased strength, improper body mechanics, and pain   ACTIVITY LIMITATIONS: carrying, lifting, sitting, standing, squatting, stairs, transfers, reach over head, and locomotion level  PARTICIPATION LIMITATIONS: meal prep, cleaning, driving, shopping, community activity, occupation, and yard  work  PERSONAL FACTORS: Time since onset of injury/illness/exacerbation and 3+ comorbidities: HTN, DM II, COPD are also affecting patient's functional outcome.   REHAB POTENTIAL: Good  CLINICAL DECISION MAKING: Evolving/moderate complexity  EVALUATION COMPLEXITY: Moderate   GOALS: Goals reviewed with patient? No  SHORT TERM GOALS: Target date: 03/15/2024   Pt will be compliant and knowledgeable with initial HEP for improved comfort and carryover Baseline: initial HEP given  03/30/24: reports compliance  Goal status: MET  2.  Pt will self report low back and LE pain no greater than 7/10 for improved comfort and functional ability Baseline: 10/10 at worst    03/30/24: 10/10 at worst  05/19/24: 8/10 Goal status: ONGOING    LONG TERM GOALS: Target date: 06/30/2024   Pt will be decrease ODI disability score to no greater than 50% (25/50) as proxy for functional improvement with home ADLs and community activites Baseline: 74% disability (37/50) 04/18/24: 30/50 (60% disability) Goal status: IN PROGRESS  2.  Pt will self report low back and LE pain no greater than 3/10 for improved comfort and functional ability Baseline: 10/10 at worst Goal status: IN PROGRESS   3.  Pt will increase 30 Second Sit to Stand rep count to no less than 10 reps for improved balance, strength, and functional mobility Baseline: 8 reps  03/14/24: 6 reps  03/22/24: 7 reps 04/18/24: 8 reps Goal status: IN PROGRESS   4.  Pt will be able to improve standing and walking activity tolerance to at least 20 minutes for improved functional ability with community navigation Baseline: 10 minutes Goal status: IN PROGRESS   PLAN:  PT FREQUENCY: 2x/week 1 x per week   PT DURATION: 8 weeks  6 weeks   PLANNED INTERVENTIONS: 97164- PT Re-evaluation, 97110-Therapeutic exercises, 97530- Therapeutic activity, [id number]- Neuromuscular re-education, 97535- Self Care, 16109- Manual therapy, [id number]- Aquatic Therapy, U0454-  Electrical stimulation (unattended), [id number]- Electrical stimulation (manual), Dry Needling, Cryotherapy, and Moist heat.  PLAN FOR NEXT  SESSION: Continue with therapeutic focus on Hamstring strength in full ROM, global BLE strength, dynamic core stability, an hip/lumbar mobility. Try aquatics and get HEP.    Gasper Karst, PTA 05/19/24 12:52 PM Phone: [phone] Fax: [phone]

## 2024-05-20 ENCOUNTER — Telehealth: Payer: Self-pay | Admitting: Family Medicine

## 2024-05-20 NOTE — Telephone Encounter (Signed)
 Copied from CRM [id number]. Topic: Medical Record Request - Payor/Billing Request >> May 20, 2024 10:08 AM Crispin Dolphin wrote: Reason for CRM: Received some information for patient but need current chart notes as well. Need to include but not limited to med list and Diagnosis. Fax: [phone] Thank You   Last office notes faxed with forms

## 2024-05-20 NOTE — Telephone Encounter (Unsigned)
 Copied from CRM [id number]. Topic: Medical Record Request - Payor/Billing Request >> May 20, 2024 10:08 AM Crispin Dolphin wrote: Reason for CRM: Received some information for patient but need current chart notes as well. Need to include but not limited to med list and Diagnosis. Fax: [phone] Thank You >> May 20, 2024 12:11 PM Crispin Dolphin wrote: Boyce Byes called back from [place]. States she received and reviewed notes. Notes states provider was not sure about submed for Trelegy. Caller states there is one - liquid form for nebulizer and it is covered by insurance. She will refax information and request for provider to review and send back so that can send out to patient.  Thank You

## 2024-05-21 ENCOUNTER — Other Ambulatory Visit: Payer: Self-pay | Admitting: Family Medicine

## 2024-05-21 DIAGNOSIS — F17200 Nicotine dependence, unspecified, uncomplicated: Secondary | ICD-10-CM

## 2024-05-22 ENCOUNTER — Other Ambulatory Visit: Payer: Self-pay | Admitting: Physician Assistant

## 2024-05-22 DIAGNOSIS — I10 Essential (primary) hypertension: Secondary | ICD-10-CM

## 2024-05-23 NOTE — Telephone Encounter (Signed)
 Last apt 05/18/24.

## 2024-05-24 ENCOUNTER — Telehealth: Payer: Self-pay | Admitting: Podiatry

## 2024-05-24 ENCOUNTER — Other Ambulatory Visit: Payer: Self-pay | Admitting: Family Medicine

## 2024-05-24 DIAGNOSIS — I10 Essential (primary) hypertension: Secondary | ICD-10-CM

## 2024-05-24 MED ORDER — HYDROCHLOROTHIAZIDE 12.5 MG PO TABS: 12.5000 mg | ORAL_TABLET | Freq: Every day | ORAL | 1 refills | Status: DC

## 2024-05-24 NOTE — Telephone Encounter (Signed)
 Patient is planning to take water aerobics. What do you recommend she put over her foot before entering the water?

## 2024-05-24 NOTE — Addendum Note (Signed)
 Addended by: JOHNA ALM BROCKS on: 05/24/2024 01:05 PM   Modules accepted: Orders

## 2024-05-24 NOTE — Telephone Encounter (Unsigned)
 Copied from CRM [id number]. Topic: Clinical - Medication Refill >> May 24, 2024  9:36 AM Charlet HERO wrote: Medication: hydrochlorothiazide  (HYDRODIURIL ) 12.5 MG tablet  Has the patient contacted their pharmacy? Yes Informed patient would need to call in  This is the patient's preferred pharmacy:  [id number] - [place] - [address] [place] ([place]) [place] [address] Phone: [phone] Fax: [phone]    Is this the correct pharmacy for this prescription? Yes If no, delete pharmacy and type the correct one.   Has the prescription been filled recently? Yes  Is the patient out of the medication? Yes  Has the patient been seen for an appointment in the last year OR does the patient have an upcoming appointment? Yes  Can we respond through MyChart? Yes  Agent: Please be advised that Rx refills may take up to 3 business days. We ask that you follow-up with your pharmacy.

## 2024-05-27 ENCOUNTER — Ambulatory Visit: Payer: Self-pay | Admitting: Physical Therapy

## 2024-05-27 ENCOUNTER — Encounter: Payer: Self-pay | Admitting: Physical Therapy

## 2024-05-27 DIAGNOSIS — R2689 Other abnormalities of gait and mobility: Secondary | ICD-10-CM

## 2024-05-27 DIAGNOSIS — M5459 Other low back pain: Secondary | ICD-10-CM | POA: Diagnosis not present

## 2024-05-27 DIAGNOSIS — M6281 Muscle weakness (generalized): Secondary | ICD-10-CM | POA: Diagnosis not present

## 2024-05-27 NOTE — Therapy (Signed)
 OUTPATIENT PHYSICAL THERAPY TREATMENT   Patient Name: [name] MRN: [id number] DOB:02-11-1956, 68 y.o., female Today's Date: 05/27/2024  END OF SESSION:  PT End of Session - 05/27/24 1204     Visit Number 12    Number of Visits 23    Date for PT Re-Evaluation 06/30/24    Authorization Type [employer] Dual Complete    PT Start Time 1149    PT Stop Time 1240    PT Time Calculation (min) 51 min              Past Medical History:  Diagnosis Date   Arthritis    Asthma    Colonic polyp    GERD (gastroesophageal reflux disease)    Hypertension    Obesity    Pain in limb    Smoker    Spinal stenosis    Swelling of limb    Past Surgical History:  Procedure Laterality Date   BIOPSY BREAST Left 09/22/2018   no maglignancy   CATARACT EXTRACTION Right 05/25/2023   COLONOSCOPY  2009   Dr. Abran   POLYPECTOMY     Patient Active Problem List   Diagnosis Date Noted   Myopathy, unspecified 07/15/2023   COPD (chronic obstructive pulmonary disease) (HCC) 11/30/2022   Stage 3a chronic kidney disease (HCC) 01/23/2021   History of cataract surgery 04/27/2020   Multiple somatic complaints 01/10/2019   Adenomatous polyp of colon 05/25/2018   Asthma, chronic, mild persistent, uncomplicated 03/06/2017   Chronic seasonal allergic rhinitis due to pollen 03/06/2017   Gastroesophageal reflux disease without esophagitis 10/10/2014   Ex-cigarette smoker 09/28/2013   Type 2 diabetes mellitus with complications (HCC) 07/12/2012   Obesity (BMI 30-39.9) 07/12/2012   Chronic back pain 07/12/2012   Hypertension associated with diabetes (HCC) 07/12/2012    PCP: Joyce Norleen BROCKS, MD  REFERRING PROVIDER: Bulah Alm RAMAN, PA-C   REFERRING DIAG: [phone] (ICD-10-CM) - Chronic low back pain, unspecified back pain laterality, unspecified whether sciatica present   Rationale for Evaluation and Treatment: Rehabilitation  THERAPY DIAG:  Other low back pain  Muscle weakness  (generalized)  Other abnormalities of gait and mobility  ONSET DATE: Chronic  SUBJECTIVE:                                                                                                                                                                                           SUBJECTIVE STATEMENT: Still having buttock pain. Turn over in bed and have pain shoot from back to foot. Going to the [employer].    EVAL: Pt presents to PT with reports of chronic LBP with  refers into posterior LE, R>L. Pt is well known to clinic, has had past success to PT. Denies bowel/bladder changes or saddle anesthesia. Also denies N/T down LE and pain does not go distal to knee. Has had a lot of issues with LE cramping lately, also is frustrated by her inability to walk or stand longer than 10 minutes.   PERTINENT HISTORY:  HTN, DM II, COPD  PAIN:  Are you having pain?  Yes: NPRS scale: 8/10 Pain location: lower back R>L, posterior hip R>L Pain description: sharp, tight, sore Aggravating factors: mornings, prolonged standing, walking Relieving factors: massage, medication  PRECAUTIONS: None  RED FLAGS: None   WEIGHT BEARING RESTRICTIONS: No  FALLS:  Has patient fallen in last 6 months? No  LIVING ENVIRONMENT: Lives with: lives alone Lives in: House/apartment Stairs: Yes: External: 12 steps; on right going up Has following equipment at home: Single point cane and Walker - 2 wheeled  OCCUPATION: Retired - Does [employer] a few times a week  PLOF: Independent  PATIENT GOALS: pt wants to decrease pain in her lower back to improve comfort with walking and shopping  NEXT MD VISIT: 03/01/2024  OBJECTIVE:  Note: Objective measures were completed at Evaluation unless otherwise noted.  DIAGNOSTIC FINDINGS:  See imaging  PATIENT SURVEYS:  ODI: 74% disability (37/50) 04/18/24: 30/50 (60% disability)  COGNITION: Overall cognitive status: Within functional limits for tasks  assessed     SENSATION: [facility]  MUSCLE LENGTH: Thomas test: Right (+); Left (+)  POSTURE: rounded shoulders, forward head, and increased lumbar lordosis  PALPATION: TTP to bilateral lumbar paraspinals  LUMBAR ROM:   AROM eval  Flexion 50%  Extension 25%  Right lateral flexion   Left lateral flexion   Right rotation   Left rotation    (Blank rows = not tested)  LOWER EXTREMITY MMT:    MMT Right eval Left eval  Hip flexion 3+/5 3+/5  Hip extension    Hip abduction 3+/5 3+/5  Hip adduction    Hip internal rotation    Hip external rotation    Knee flexion 4/5 4/5  Knee extension 4/5 4/5  Ankle dorsiflexion    Ankle plantarflexion    Ankle inversion    Ankle eversion     (Blank rows = not tested)  LUMBAR SPECIAL TESTS:  Straight leg raise test: Negative and Slump test: Negative  FUNCTIONAL TESTS:  30 Second Sit to Stand: 8 reps 03/14/24: 6 reps  03/22/24: 7 reps 04/18/24: 8 reps   GAIT: Distance walked: 60ft Assistive device utilized: Single point cane Level of assistance: SBA Comments: trunk flexed, decreased gait speed  TREATMENT: [facility] Adult PT Treatment:                                                DATE: 05/27/24 Therapeutic Exercise: Nustep L5 x 10 min for activty tolerance  Seated lumbar flexion with ball and laterals Seated h/s stretches  Qped Childs pose rocking forward and laterals  Seated upper trunk rotations  Therapeutic Activity: Heel raise x 10 Sink squat x 4  Supported at counter, hip flexion 5 x 2  Modalities: Modalities: Estim : IFC low back x  1 5 min    [facility] Adult PT Treatment:  DATE: 05/19/24 Therapeutic Exercise: Nustep L5 x 10 min for activty tolerance  Seated lumbar flexion with ball and laterals Seated h/s stretches  Qped Childs pose rocking forward and laterals  Cat /camel Seated upper trunk rotations  Therapeutic Activity: Plan of care: missed several weeks due to  injections and conflict. Plan a trial of aquatic therapy x 3 and extend POC, then probable DC to HEP for land and aquatics Modalities: Estim : pre mod 1 channel low back , channel 2 bilateral hips x 1 5 min     [facility] Adult PT Treatment:                                                DATE: 04/27/24  Nustep L5 6 minutes  Seated lumbar flexion x 10 with ball Supine LTR Supine h/s curls with feet on ball Supine marching  Childs pose rocking Qped Ab draw in x 10 Prone manual STW/compression to hamstrings bilat Prone alternating h/s curls  Seated h/s curls with foot prop of step 2 x 10 sec each  Seated figure 4 stretch    [facility] Adult PT Treatment:                                                DATE: 04/18/24 Therapeutic Activity: Assessment of tests/measures, goals, and outcomes Modalities: Estim : pre mod 1 channel low back , channel 2 bilateral hips x 1 5 min  [facility] Adult PT Treatment:                                                DATE: 03/30/24 Therapeutic Exercise: Seated lumbar flexion with ball x 20 Single Long sitting h/s stretches on mat , x 3 bilateral  STS 10 x 2 , added 10#  Figure 4 stretch seated bilateral  Nustep   Modalities: Estim : pre mod 1 channel low back , channel 2 bilateral hips x 1 5 min  [facility] Adult PT Treatment:                                                DATE: 03/24/24 Therapeutic Exercise: Seated h/s stretch 2x30 Seated figure 4 2x30 Repeated lumbar flexion with ball x 15 STS 2x10 Nustep L5 UE/LE x 4 minutes for functional activity tolerance Modalities: Estim: PREMOD x 15 premod to tolerance bilateral glute and proximal hamstrings  HMP: L hip in R sidelying concurrent with estim (removed at 10 minutes)  PATIENT EDUCATION:  Education details: continue HEP Person educated: Patient Education method: Explanation, Demonstration, and Handouts Education comprehension: verbalized understanding and returned demonstration  HOME EXERCISE  PROGRAM: Access Code: [email or web address] URL: [email or web address] Date: 02/23/2024 Prepared by: Alm Kingdom  Exercises - Supine Lower Trunk Rotation  - 1-2 x daily - 7 x weekly - 2 sets - 10 reps - Supine Posterior Pelvic Tilt  - 1-2 x daily - 7 x weekly - 2 sets - 10 reps - 5 sec hold -  Modified Thomas Stretch  - 1-2 x daily - 7 x weekly - 2 reps - 30 sec hold - Seated Figure 4 Piriformis Stretch  - 1-2 x daily - 7 x weekly - 2 reps - 30 sec hold - Sit to Stand  - 1-2 x daily - 7 x weekly - 3 sets - 10 reps - Seated Hamstring Stretch  - 1-2 x daily - 7 x weekly - 2 reps - 30 sec hold  ASSESSMENT:  CLINICAL IMPRESSION: Pt reports lumbar epidural injections 3 weeks ago with minimal improvement this far. Pt with 8/10 in low back/posterior legs, flexed posture with ambulation and increased pain with reaching up or extending. Tolerates flexion biased stretching and strengthening, with limited carry over in benefit. Pt will try a couple of sessions of aquatics and get HEP.  Ended session with estim per pt request.    EVAL: Patient is a 69 y.o. F who was seen today for physical therapy evaluation and treatment for chronic lower back pain and discomfort that radiates into bilateral LE;. Physical findings are consistent with referring provider impression as pt demonstrates decrease in core and hip strength as well as sharp decline in functional mobility. Oswestry score indicates severe disability in performance of home ADLs and higher level community activities. Pt would benefit from skilled PT services working on improving core/hip strength in order to decrease pain.    OBJECTIVE IMPAIRMENTS: Abnormal gait, decreased activity tolerance, decreased balance, decreased endurance, decreased mobility, difficulty walking, decreased ROM, decreased strength, improper body mechanics, and pain   ACTIVITY LIMITATIONS: carrying, lifting, sitting, standing, squatting, stairs, transfers, reach over head,  and locomotion level  PARTICIPATION LIMITATIONS: meal prep, cleaning, driving, shopping, community activity, occupation, and yard work  PERSONAL FACTORS: Time since onset of injury/illness/exacerbation and 3+ comorbidities: HTN, DM II, COPD are also affecting patient's functional outcome.   REHAB POTENTIAL: Good  CLINICAL DECISION MAKING: Evolving/moderate complexity  EVALUATION COMPLEXITY: Moderate   GOALS: Goals reviewed with patient? No  SHORT TERM GOALS: Target date: 03/15/2024   Pt will be compliant and knowledgeable with initial HEP for improved comfort and carryover Baseline: initial HEP given  03/30/24: reports compliance  Goal status: MET  2.  Pt will self report low back and LE pain no greater than 7/10 for improved comfort and functional ability Baseline: 10/10 at worst    03/30/24: 10/10 at worst  05/19/24: 8/10 Goal status: ONGOING    LONG TERM GOALS: Target date: 06/30/2024   Pt will be decrease ODI disability score to no greater than 50% (25/50) as proxy for functional improvement with home ADLs and community activites Baseline: 74% disability (37/50) 04/18/24: 30/50 (60% disability) Goal status: IN PROGRESS  2.  Pt will self report low back and LE pain no greater than 3/10 for improved comfort and functional ability Baseline: 10/10 at worst Goal status: IN PROGRESS   3.  Pt will increase 30 Second Sit to Stand rep count to no less than 10 reps for improved balance, strength, and functional mobility Baseline: 8 reps  03/14/24: 6 reps  03/22/24: 7 reps 04/18/24: 8 reps Goal status: IN PROGRESS   4.  Pt will be able to improve standing and walking activity tolerance to at least 20 minutes for improved functional ability with community navigation Baseline: 10 minutes Goal status: IN PROGRESS   PLAN:  PT FREQUENCY: 2x/week 1 x per week   PT DURATION: 8 weeks  6 weeks   PLANNED INTERVENTIONS: [id number]- PT Re-evaluation, 97110-Therapeutic exercises,  02469-  Therapeutic activity, [id number]- Neuromuscular re-education, [id number]- Self Care, [id number]- Manual therapy, [id number]- Aquatic Therapy, [phone]- Electrical stimulation (unattended), [id number]- Electrical stimulation (manual), Dry Needling, Cryotherapy, and Moist heat.  PLAN FOR NEXT SESSION: Continue with therapeutic focus on Hamstring strength in full ROM, global BLE strength, dynamic core stability, an hip/lumbar mobility. Try aquatics and get HEP.    Harlene Persons, PTA 05/27/24 12:26 PM Phone: [phone] Fax: [phone]

## 2024-06-01 ENCOUNTER — Ambulatory Visit: Payer: Self-pay | Admitting: Physical Therapy

## 2024-06-02 LAB — HM DIABETES EYE EXAM

## 2024-06-08 ENCOUNTER — Ambulatory Visit: Payer: Self-pay | Attending: Medical | Admitting: Physical Therapy

## 2024-06-08 ENCOUNTER — Encounter: Payer: Self-pay | Admitting: Physical Therapy

## 2024-06-08 DIAGNOSIS — M5459 Other low back pain: Secondary | ICD-10-CM | POA: Diagnosis not present

## 2024-06-08 DIAGNOSIS — R2689 Other abnormalities of gait and mobility: Secondary | ICD-10-CM | POA: Insufficient documentation

## 2024-06-08 DIAGNOSIS — M6281 Muscle weakness (generalized): Secondary | ICD-10-CM | POA: Diagnosis not present

## 2024-06-08 NOTE — Therapy (Signed)
 OUTPATIENT PHYSICAL THERAPY TREATMENT   Patient Name: [name] MRN: [id number] DOB:1956/09/28, 68 y.o., female Today's Date: 06/08/2024  END OF SESSION:  PT End of Session - 06/08/24 1509     Visit Number 13    Number of Visits 23    Date for PT Re-Evaluation 06/30/24    Authorization Type [employer] Dual Complete    PT Start Time 1515    PT Stop Time 1600    PT Time Calculation (min) 45 min    Activity Tolerance Patient tolerated treatment well    Behavior During Therapy WFL for tasks assessed/performed               Past Medical History:  Diagnosis Date   Arthritis    Asthma    Colonic polyp    GERD (gastroesophageal reflux disease)    Hypertension    Obesity    Pain in limb    Smoker    Spinal stenosis    Swelling of limb    Past Surgical History:  Procedure Laterality Date   BIOPSY BREAST Left 09/22/2018   no maglignancy   CATARACT EXTRACTION Right 05/25/2023   COLONOSCOPY  2009   Dr. Abran   POLYPECTOMY     Patient Active Problem List   Diagnosis Date Noted   Myopathy, unspecified 07/15/2023   COPD (chronic obstructive pulmonary disease) (HCC) 11/30/2022   Stage 3a chronic kidney disease (HCC) 01/23/2021   History of cataract surgery 04/27/2020   Multiple somatic complaints 01/10/2019   Adenomatous polyp of colon 05/25/2018   Asthma, chronic, mild persistent, uncomplicated 03/06/2017   Chronic seasonal allergic rhinitis due to pollen 03/06/2017   Gastroesophageal reflux disease without esophagitis 10/10/2014   Ex-cigarette smoker 09/28/2013   Type 2 diabetes mellitus with complications (HCC) 07/12/2012   Obesity (BMI 30-39.9) 07/12/2012   Chronic back pain 07/12/2012   Hypertension associated with diabetes (HCC) 07/12/2012    PCP: Joyce Norleen BROCKS, MD  REFERRING PROVIDER: Bulah Alm RAMAN, PA-C   REFERRING DIAG: [phone] (ICD-10-CM) - Chronic low back pain, unspecified back pain laterality, unspecified whether sciatica present    Rationale for Evaluation and Treatment: Rehabilitation  THERAPY DIAG:  Other low back pain  Muscle weakness (generalized)  Other abnormalities of gait and mobility  ONSET DATE: Chronic  SUBJECTIVE:                                                                                                                                                                                           SUBJECTIVE STATEMENT: Pt enters clinic with 9/10 buttock pain. Pt is apprehensive about being in the water but  states she will try to do well   EVAL: Pt presents to PT with reports of chronic LBP with refers into posterior LE, R>L. Pt is well known to clinic, has had past success to PT. Denies bowel/bladder changes or saddle anesthesia. Also denies N/T down LE and pain does not go distal to knee. Has had a lot of issues with LE cramping lately, also is frustrated by her inability to walk or stand longer than 10 minutes.   PERTINENT HISTORY:  HTN, DM II, COPD  PAIN:  Are you having pain?  Yes: NPRS scale: 8/10 Pain location: lower back R>L, posterior hip R>L Pain description: sharp, tight, sore Aggravating factors: mornings, prolonged standing, walking Relieving factors: massage, medication  PRECAUTIONS: None  RED FLAGS: None   WEIGHT BEARING RESTRICTIONS: No  FALLS:  Has patient fallen in last 6 months? No  LIVING ENVIRONMENT: Lives with: lives alone Lives in: House/apartment Stairs: Yes: External: 12 steps; on right going up Has following equipment at home: Single point cane and Walker - 2 wheeled  OCCUPATION: Retired - Does [employer] a few times a week  PLOF: Independent  PATIENT GOALS: pt wants to decrease pain in her lower back to improve comfort with walking and shopping  NEXT MD VISIT: 03/01/2024  OBJECTIVE:  Note: Objective measures were completed at Evaluation unless otherwise noted.  DIAGNOSTIC FINDINGS:  See imaging  PATIENT SURVEYS:  ODI: 74%  disability (37/50) 04/18/24: 30/50 (60% disability)  COGNITION: Overall cognitive status: Within functional limits for tasks assessed     SENSATION: [facility]  MUSCLE LENGTH: Thomas test: Right (+); Left (+)  POSTURE: rounded shoulders, forward head, and increased lumbar lordosis  PALPATION: TTP to bilateral lumbar paraspinals  LUMBAR ROM:   AROM eval  Flexion 50%  Extension 25%  Right lateral flexion   Left lateral flexion   Right rotation   Left rotation    (Blank rows = not tested)  LOWER EXTREMITY MMT:    MMT Right eval Left eval  Hip flexion 3+/5 3+/5  Hip extension    Hip abduction 3+/5 3+/5  Hip adduction    Hip internal rotation    Hip external rotation    Knee flexion 4/5 4/5  Knee extension 4/5 4/5  Ankle dorsiflexion    Ankle plantarflexion    Ankle inversion    Ankle eversion     (Blank rows = not tested)  LUMBAR SPECIAL TESTS:  Straight leg raise test: Negative and Slump test: Negative  FUNCTIONAL TESTS:  30 Second Sit to Stand: 8 reps 03/14/24: 6 reps  03/22/24: 7 reps 04/18/24: 8 reps   GAIT: Distance walked: 40ft Assistive device utilized: Single point cane Level of assistance: SBA Comments: trunk flexed, decreased gait speed  TREATMENT:  [facility] Adult PT Treatment:                                                DATE: 06-08-24 Aquatic therapy at [facility]- [address] - therapeutic pool temp approximately 92 degrees. Pt enters building independently. Treatment took place in water 3.8 to  4 ft 8 in. deep depending upon activity.  Pt entered and exited the pool via stair and handrails independently.  Pt was educated on beneficial therapeutic effects of water while ambulating to acclimate to water walking forward, backward and side stepping.  Pt educated on neutral  posture and hip hinging in seated position with water at chest level x 10 with stretch to low back and then x 10 with back at pool wall at external cue, VC for neck tucked to  prevent hyperextension.  Aquatic Exercise: On submerged bench and on edge of pool holding with one UE   Walking side forward and side stepping across pool x 4 with Yellow DB for balance, VC for increasing stride length Aqua stretch to bil buttocks and bil hamstrings and left gastrocs Heel/ toe raises BIL   Hip ext/flex with knee straight  Hip abduction/adduction x10 BIL Hamstring curl Hip circles CW/CCW  pt fatiguing Stomp with yellow noodle  Squats x 20 Hamstring stretch bottom step x30 BIL 3 way stretch with pool noodle for stretch of adductors, hamstrings and IT band   Pt requires the buoyancy of water for active assisted exercises with buoyancy supported for strengthening and AROM exercises. Hydrostatic pressure also supports joints by unweighting joint load by at least 50 % in 3-4 feet depth water. 80% in chest to neck deep water. Water will provide assistance with movement using the current and laminar flow while the buoyancy reduces weight bearing. Pt requires the viscosity of the water for resistance endurance  with strengthening  [facility] Adult PT Treatment:                                                DATE: 05/27/24 Therapeutic Exercise: Nustep L5 x 10 min for activty tolerance  Seated lumbar flexion with ball and laterals Seated h/s stretches  Qped Childs pose rocking forward and laterals  Seated upper trunk rotations  Therapeutic Activity: Heel raise x 10 Sink squat x 4  Supported at counter, hip flexion 5 x 2  Modalities: Modalities: Estim : IFC low back x  1 5 min    [facility] Adult PT Treatment:                                                DATE: 05/19/24 Therapeutic Exercise: Nustep L5 x 10 min for activty tolerance  Seated lumbar flexion with ball and laterals Seated h/s stretches  Qped Childs pose rocking forward and laterals  Cat /camel Seated upper trunk rotations  Therapeutic Activity: Plan of care: missed several weeks due to injections and conflict. Plan  a trial of aquatic therapy x 3 and extend POC, then probable DC to HEP for land and aquatics Modalities: Estim : pre mod 1 channel low back , channel 2 bilateral hips x 1 5 min     [facility] Adult PT Treatment:                                                DATE: 04/27/24  Nustep L5 6 minutes  Seated lumbar flexion x 10 with ball Supine LTR Supine h/s curls with feet on ball Supine marching  Childs pose rocking Qped Ab draw in x 10 Prone manual STW/compression to hamstrings bilat Prone alternating h/s curls  Seated h/s curls with foot prop of step 2 x 10 sec each  Seated figure  4 stretch    [facility] Adult PT Treatment:                                                DATE: 04/18/24 Therapeutic Activity: Assessment of tests/measures, goals, and outcomes Modalities: Estim : pre mod 1 channel low back , channel 2 bilateral hips x 1 5 min  [facility] Adult PT Treatment:                                                DATE: 03/30/24 Therapeutic Exercise: Seated lumbar flexion with ball x 20 Single Long sitting h/s stretches on mat , x 3 bilateral  STS 10 x 2 , added 10#  Figure 4 stretch seated bilateral  Nustep   Modalities: Estim : pre mod 1 channel low back , channel 2 bilateral hips x 1 5 min  [facility] Adult PT Treatment:                                                DATE: 03/24/24 Therapeutic Exercise: Seated h/s stretch 2x30 Seated figure 4 2x30 Repeated lumbar flexion with ball x 15 STS 2x10 Nustep L5 UE/LE x 4 minutes for functional activity tolerance Modalities: Estim: PREMOD x 15 premod to tolerance bilateral glute and proximal hamstrings  HMP: L hip in R sidelying concurrent with estim (removed at 10 minutes)  PATIENT EDUCATION:  Education details: continue HEP Person educated: Patient Education method: Explanation, Demonstration, and Handouts Education comprehension: verbalized understanding and returned demonstration  HOME EXERCISE PROGRAM: Access Code: [email or web address] URL:  [email or web address] Date: 02/23/2024 Prepared by: Alm Kingdom  Exercises - Supine Lower Trunk Rotation  - 1-2 x daily - 7 x weekly - 2 sets - 10 reps - Supine Posterior Pelvic Tilt  - 1-2 x daily - 7 x weekly - 2 sets - 10 reps - 5 sec hold - Modified Thomas Stretch  - 1-2 x daily - 7 x weekly - 2 reps - 30 sec hold - Seated Figure 4 Piriformis Stretch  - 1-2 x daily - 7 x weekly - 2 reps - 30 sec hold - Sit to Stand  - 1-2 x daily - 7 x weekly - 3 sets - 10 reps - Seated Hamstring Stretch  - 1-2 x daily - 7 x weekly - 2 reps - 30 sec hold  ASSESSMENT:  CLINICAL IMPRESSION: Patient presents to first aquatic PT session reporting  9/10 pain and pt comments that she has reduced pain in the water.  Session today focused on establishing aquatic HEP, general strengthening  and  LE in the aquatic environment for use of buoyancy to offload joints and the viscosity of water as resistance during therapeutic exercise. Patient was able to tolerate all prescribed exercises in the aquatic environment with no adverse effects. Pt did experience hamstring Left and gastroc cramping at end of session but did resolve. Patient continues to benefit from skilled PT services on land and aquatic based and should be progressed as able to improve functional independence      EVAL: Patient is a 68 y.o. F  who was seen today for physical therapy evaluation and treatment for chronic lower back pain and discomfort that radiates into bilateral LE;. Physical findings are consistent with referring provider impression as pt demonstrates decrease in core and hip strength as well as sharp decline in functional mobility. Oswestry score indicates severe disability in performance of home ADLs and higher level community activities. Pt would benefit from skilled PT services working on improving core/hip strength in order to decrease pain.    OBJECTIVE IMPAIRMENTS: Abnormal gait, decreased activity tolerance, decreased  balance, decreased endurance, decreased mobility, difficulty walking, decreased ROM, decreased strength, improper body mechanics, and pain   ACTIVITY LIMITATIONS: carrying, lifting, sitting, standing, squatting, stairs, transfers, reach over head, and locomotion level  PARTICIPATION LIMITATIONS: meal prep, cleaning, driving, shopping, community activity, occupation, and yard work  PERSONAL FACTORS: Time since onset of injury/illness/exacerbation and 3+ comorbidities: HTN, DM II, COPD are also affecting patient's functional outcome.   REHAB POTENTIAL: Good  CLINICAL DECISION MAKING: Evolving/moderate complexity  EVALUATION COMPLEXITY: Moderate   GOALS: Goals reviewed with patient? No  SHORT TERM GOALS: Target date: 03/15/2024   Pt will be compliant and knowledgeable with initial HEP for improved comfort and carryover Baseline: initial HEP given  03/30/24: reports compliance  Goal status: MET  2.  Pt will self report low back and LE pain no greater than 7/10 for improved comfort and functional ability Baseline: 10/10 at worst    03/30/24: 10/10 at worst  05/19/24: 8/10 Goal status: ONGOING    LONG TERM GOALS: Target date: 06/30/2024   Pt will be decrease ODI disability score to no greater than 50% (25/50) as proxy for functional improvement with home ADLs and community activites Baseline: 74% disability (37/50) 04/18/24: 30/50 (60% disability) Goal status: IN PROGRESS  2.  Pt will self report low back and LE pain no greater than 3/10 for improved comfort and functional ability Baseline: 10/10 at worst Goal status: IN PROGRESS   3.  Pt will increase 30 Second Sit to Stand rep count to no less than 10 reps for improved balance, strength, and functional mobility Baseline: 8 reps  03/14/24: 6 reps  03/22/24: 7 reps 04/18/24: 8 reps Goal status: IN PROGRESS   4.  Pt will be able to improve standing and walking activity tolerance to at least 20 minutes for improved functional ability  with community navigation Baseline: 10 minutes Goal status: IN PROGRESS   PLAN:  PT FREQUENCY: 2x/week 1 x per week   PT DURATION: 8 weeks  6 weeks   PLANNED INTERVENTIONS: 97164- PT Re-evaluation, 97110-Therapeutic exercises, 97530- Therapeutic activity, [id number]- Neuromuscular re-education, 97535- Self Care, [id number]- Manual therapy, [id number]- Aquatic Therapy, [id number]- Electrical stimulation (unattended), [id number]- Electrical stimulation (manual), Dry Needling, Cryotherapy, and Moist heat.  PLAN FOR NEXT SESSION: Continue with therapeutic focus on Hamstring strength in full ROM, global BLE strength, dynamic core stability, an hip/lumbar mobility. Try aquatics and get HEP.    Graydon Dingwall, PT, ATRIC Certified Exercise Expert for the Aging Adult  06/08/24 4:07 PM Phone: [phone] Fax: [phone]

## 2024-06-09 DIAGNOSIS — J45998 Other asthma: Secondary | ICD-10-CM | POA: Diagnosis not present

## 2024-06-14 ENCOUNTER — Ambulatory Visit: Admitting: Podiatry

## 2024-06-14 NOTE — Therapy (Unsigned)
 OUTPATIENT PHYSICAL THERAPY TREATMENT   Patient Name: [name] MRN: [id number] DOB:January 19, 1956, 68 y.o., female Today's Date: 06/15/2024  END OF SESSION:  PT End of Session - 06/15/24 1544     Visit Number 14    Number of Visits 23    Date for PT Re-Evaluation 06/30/24    Authorization Type [employer] Dual Complete    PT Start Time 1545    PT Stop Time 1633    PT Time Calculation (min) 48 min    Activity Tolerance Patient tolerated treatment well    Behavior During Therapy WFL for tasks assessed/performed                Past Medical History:  Diagnosis Date   Arthritis    Asthma    Colonic polyp    GERD (gastroesophageal reflux disease)    Hypertension    Obesity    Pain in limb    Smoker    Spinal stenosis    Swelling of limb    Past Surgical History:  Procedure Laterality Date   BIOPSY BREAST Left 09/22/2018   no maglignancy   CATARACT EXTRACTION Right 05/25/2023   COLONOSCOPY  2009   Dr. Abran   POLYPECTOMY     Patient Active Problem List   Diagnosis Date Noted   Myopathy, unspecified 07/15/2023   COPD (chronic obstructive pulmonary disease) (HCC) 11/30/2022   Stage 3a chronic kidney disease (HCC) 01/23/2021   History of cataract surgery 04/27/2020   Multiple somatic complaints 01/10/2019   Adenomatous polyp of colon 05/25/2018   Asthma, chronic, mild persistent, uncomplicated 03/06/2017   Chronic seasonal allergic rhinitis due to pollen 03/06/2017   Gastroesophageal reflux disease without esophagitis 10/10/2014   Ex-cigarette smoker 09/28/2013   Type 2 diabetes mellitus with complications (HCC) 07/12/2012   Obesity (BMI 30-39.9) 07/12/2012   Chronic back pain 07/12/2012   Hypertension associated with diabetes (HCC) 07/12/2012    PCP: Joyce Norleen BROCKS, MD  REFERRING PROVIDER: Bulah Alm RAMAN, PA-C   REFERRING DIAG: [phone] (ICD-10-CM) - Chronic low back pain, unspecified back pain laterality, unspecified whether sciatica present    Rationale for Evaluation and Treatment: Rehabilitation  THERAPY DIAG:  Other low back pain  Muscle weakness (generalized)  Other abnormalities of gait and mobility  ONSET DATE: Chronic  SUBJECTIVE:                                                                                                                                                                                           SUBJECTIVE STATEMENT: Pt enters pool area with 9/10 buttock pain. Pt with pain that prevents her from  walking freely even in water.   EVAL: Pt presents to PT with reports of chronic LBP with refers into posterior LE, R>L. Pt is well known to clinic, has had past success to PT. Denies bowel/bladder changes or saddle anesthesia. Also denies N/T down LE and pain does not go distal to knee. Has had a lot of issues with LE cramping lately, also is frustrated by her inability to walk or stand longer than 10 minutes.   PERTINENT HISTORY:  HTN, DM II, COPD  PAIN:  Are you having pain?  Yes: NPRS scale: 8/10 Pain location: lower back R>L, posterior hip R>L Pain description: sharp, tight, sore Aggravating factors: mornings, prolonged standing, walking Relieving factors: massage, medication  PRECAUTIONS: None  RED FLAGS: None   WEIGHT BEARING RESTRICTIONS: No  FALLS:  Has patient fallen in last 6 months? No  LIVING ENVIRONMENT: Lives with: lives alone Lives in: House/apartment Stairs: Yes: External: 12 steps; on right going up Has following equipment at home: Single point cane and Walker - 2 wheeled  OCCUPATION: Retired - Does [employer] a few times a week  PLOF: Independent  PATIENT GOALS: pt wants to decrease pain in her lower back to improve comfort with walking and shopping  NEXT MD VISIT: 03/01/2024  OBJECTIVE:  Note: Objective measures were completed at Evaluation unless otherwise noted.  DIAGNOSTIC FINDINGS:  See imaging  PATIENT SURVEYS:  ODI: 74% disability  (37/50) 04/18/24: 30/50 (60% disability)  COGNITION: Overall cognitive status: Within functional limits for tasks assessed     SENSATION: [facility]  MUSCLE LENGTH: Thomas test: Right (+); Left (+)  POSTURE: rounded shoulders, forward head, and increased lumbar lordosis  PALPATION: TTP to bilateral lumbar paraspinals  LUMBAR ROM:   AROM eval  Flexion 50%  Extension 25%  Right lateral flexion   Left lateral flexion   Right rotation   Left rotation    (Blank rows = not tested)  LOWER EXTREMITY MMT:    MMT Right eval Left eval  Hip flexion 3+/5 3+/5  Hip extension    Hip abduction 3+/5 3+/5  Hip adduction    Hip internal rotation    Hip external rotation    Knee flexion 4/5 4/5  Knee extension 4/5 4/5  Ankle dorsiflexion    Ankle plantarflexion    Ankle inversion    Ankle eversion     (Blank rows = not tested)  LUMBAR SPECIAL TESTS:  Straight leg raise test: Negative and Slump test: Negative  FUNCTIONAL TESTS:  30 Second Sit to Stand: 8 reps 03/14/24: 6 reps  03/22/24: 7 reps 04/18/24: 8 reps   GAIT: Distance walked: 29ft Assistive device utilized: Single point cane Level of assistance: SBA Comments: trunk flexed, decreased gait speed  TREATMENT: [facility] Adult PT Treatment:                                                DATE: 06-15-24 Aquatic therapy at [facility] [address] - therapeutic pool temp approximately 92 degrees. Pt enters building independently with SPC. Treatment took place in water 3.8 to  4 ft 8 in. deep depending upon activity.  Pt entered and exited the pool via stair and handrails independently.  HEP for aquatic exercise Access Code: [email or web address] URL: [email or web address] Aquatic Exercise: Cat Cow in Shallow Water with Pool Noodle   Holding wall - hip abduction  Hip Swing  Standing Shoulder Horizontal Abduction With Dumbbells   Farmer's Walk   Step Up   Sit to Stand Without Arm Support  Lunge to Target at [employer]    Side Stepping with Hand Floats   Standing Hip Circles with Noodle at [facility]   Standing Hip Internal and External Rotation   Seated Straddle on Flotation CGA x1 Forward Breast Stroke Arms and Bicycle Legs  ( requiring extra time for proper execution but still remained with one UE support on side of pool 3 -way Leg Stretch with Pool Noodle      Pt requires the buoyancy of water for active assisted exercises with buoyancy supported for strengthening and AROM exercises. Hydrostatic pressure also supports joints by unweighting joint load by at least 50 % in 3-4 feet depth water. 80% in chest to neck deep water. Water will provide assistance with movement using the current and laminar flow while the buoyancy reduces weight bearing. Pt requires the viscosity of the water for resistance endurance  with strengthening  [facility] Adult PT Treatment:                                                DATE: 06-08-24 Aquatic therapy at [facility] [address] - therapeutic pool temp approximately 92 degrees. Pt enters building independently. Treatment took place in water 3.8 to  4 ft 8 in. deep depending upon activity.  Pt entered and exited the pool via stair and handrails independently.  Pt was educated on beneficial therapeutic effects of water while ambulating to acclimate to water walking forward, backward and side stepping.  Pt educated on neutral posture and hip hinging in seated position with water at chest level x 10 with stretch to low back and then x 10 with back at pool wall at external cue, VC for neck tucked to prevent hyperextension.  Aquatic Exercise: On submerged bench and on edge of pool holding with one UE   Walking side forward and side stepping across pool x 4 with Yellow DB for balance, VC for increasing stride length Aqua stretch to bil buttocks and bil hamstrings and left gastrocs Heel/ toe raises BIL   Hip ext/flex with knee straight  Hip abduction/adduction x10 BIL Hamstring  curl Hip circles CW/CCW  pt fatiguing Stomp with yellow noodle  Squats x 20 Hamstring stretch bottom step x30 BIL 3 way stretch with pool noodle for stretch of adductors, hamstrings and IT band   Pt requires the buoyancy of water for active assisted exercises with buoyancy supported for strengthening and AROM exercises. Hydrostatic pressure also supports joints by unweighting joint load by at least 50 % in 3-4 feet depth water. 80% in chest to neck deep water. Water will provide assistance with movement using the current and laminar flow while the buoyancy reduces weight bearing. Pt requires the viscosity of the water for resistance endurance  with strengthening  [facility] Adult PT Treatment:                                                DATE: 05/27/24 Therapeutic Exercise: Nustep L5 x 10 min for activty tolerance  Seated lumbar flexion with ball and laterals Seated h/s stretches  Qped Karolynn  pose rocking forward and laterals  Seated upper trunk rotations  Therapeutic Activity: Heel raise x 10 Sink squat x 4  Supported at counter, hip flexion 5 x 2  Modalities: Modalities: Estim : IFC low back x  1 5 min    [facility] Adult PT Treatment:                                                DATE: 05/19/24 Therapeutic Exercise: Nustep L5 x 10 min for activty tolerance  Seated lumbar flexion with ball and laterals Seated h/s stretches  Qped Childs pose rocking forward and laterals  Cat /camel Seated upper trunk rotations  Therapeutic Activity: Plan of care: missed several weeks due to injections and conflict. Plan a trial of aquatic therapy x 3 and extend POC, then probable DC to HEP for land and aquatics Modalities: Estim : pre mod 1 channel low back , channel 2 bilateral hips x 1 5 min     [facility] Adult PT Treatment:                                                DATE: 04/27/24  Nustep L5 6 minutes  Seated lumbar flexion x 10 with ball Supine LTR Supine h/s curls with feet on ball Supine  marching  Childs pose rocking Qped Ab draw in x 10 Prone manual STW/compression to hamstrings bilat Prone alternating h/s curls  Seated h/s curls with foot prop of step 2 x 10 sec each  Seated figure 4 stretch    [facility] Adult PT Treatment:                                                DATE: 04/18/24 Therapeutic Activity: Assessment of tests/measures, goals, and outcomes Modalities: Estim : pre mod 1 channel low back , channel 2 bilateral hips x 1 5 min  [facility] Adult PT Treatment:                                                DATE: 03/30/24 Therapeutic Exercise: Seated lumbar flexion with ball x 20 Single Long sitting h/s stretches on mat , x 3 bilateral  STS 10 x 2 , added 10#  Figure 4 stretch seated bilateral  Nustep   Modalities: Estim : pre mod 1 channel low back , channel 2 bilateral hips x 1 5 min  [facility] Adult PT Treatment:                                                DATE: 03/24/24 Therapeutic Exercise: Seated h/s stretch 2x30 Seated figure 4 2x30 Repeated lumbar flexion with ball x 15 STS 2x10 Nustep L5 UE/LE x 4 minutes for functional activity tolerance Modalities: Estim: PREMOD x 15 premod to tolerance bilateral glute and proximal hamstrings  HMP: L  hip in R sidelying concurrent with estim (removed at 10 minutes)  PATIENT EDUCATION:  Education details: continue HEP Person educated: Patient Education method: Explanation, Demonstration, and Handouts Education comprehension: verbalized understanding and returned demonstration  HOME EXERCISE PROGRAM: Access Code: [email or web address] URL: [email or web address] Date: 02/23/2024 Prepared by: Alm Kingdom  Exercises - Supine Lower Trunk Rotation  - 1-2 x daily - 7 x weekly - 2 sets - 10 reps - Supine Posterior Pelvic Tilt  - 1-2 x daily - 7 x weekly - 2 sets - 10 reps - 5 sec hold - Modified Thomas Stretch  - 1-2 x daily - 7 x weekly - 2 reps - 30 sec hold - Seated Figure 4 Piriformis Stretch  - 1-2 x  daily - 7 x weekly - 2 reps - 30 sec hold - Sit to Stand  - 1-2 x daily - 7 x weekly - 3 sets - 10 reps - Seated Hamstring Stretch  - 1-2 x daily - 7 x weekly - 2 reps - 30 sec hold Aquatic HEP Access Code: [email or web address] URL: [email or web address] Date: 06/14/2024 Prepared by: Graydon Dingwall  Exercises - Cat Cow in Shallow Water with Pool Noodle  - 1 x daily - 7 x weekly - 3 sets - 10 reps - Holding wall - hip abduction  - 1 x daily - 7 x weekly - 3 sets - 10 reps - Hip Swing  - 1 x daily - 7 x weekly - 3 sets - 10 reps - Standing Shoulder Horizontal Abduction With Dumbbells  - 1 x daily - 7 x weekly - 3 sets - 10 reps - Farmer's Walk  - 1 x daily - 7 x weekly - 3 sets - 10 reps - Step Up  - 1 x daily - 7 x weekly - 3 sets - 10 reps - Sit to Stand Without Arm Support  - 1 x daily - 7 x weekly - 3 sets - 10 reps - Lunge to Target at [employer]  - 1 x daily - 7 x weekly - 3 sets - 10 reps - Side Stepping with Hand Floats  - 1 x daily - 7 x weekly - 3 sets - 10 reps - Standing Hip Circles with Noodle at [employer]  - 1 x daily - 7 x weekly - 3 sets - 10 reps - Standing Hip Internal and External Rotation  - 1 x daily - 7 x weekly - 3 sets - 10 reps - Seated Straddle on Flotation Forward Breast Stroke Arms and Bicycle Legs  - 1 x daily - 7 x weekly - 3 sets - 10 reps - 3 -way Leg Stretch with Pool Noodle  - 1 x daily - 7 x weekly - 3 sets - 10 reps ASSESSMENT:  CLINICAL IMPRESSION: Patient presents to second aquatic PT session reporting  9/10 pain and pt comments that she has reduced pain in the water but she still is greatly affected with movement even in water with shortened stride length and dependence on UE support.  Session today focused on  reviewing and demonstrating aquatic HEP, general strengthening  and  LE in the aquatic environment for use of buoyancy to offload joints and the viscosity of water as resistance during therapeutic exercise. Patient was able to tolerate all  prescribed exercises in the aquatic environment with no adverse effects but required extra time in order to complete each one and the floating straddle required CGA x 1 for safety. Pt pain better in  water but very present and affecting ability to walk with fluid motion even in water environment. Patient continues to benefit from skilled PT services on land and aquatic based and should be progressed as able to improve functional independence      EVAL: Patient is a 68 y.o. F who was seen today for physical therapy evaluation and treatment for chronic lower back pain and discomfort that radiates into bilateral LE;. Physical findings are consistent with referring provider impression as pt demonstrates decrease in core and hip strength as well as sharp decline in functional mobility. Oswestry score indicates severe disability in performance of home ADLs and higher level community activities. Pt would benefit from skilled PT services working on improving core/hip strength in order to decrease pain.    OBJECTIVE IMPAIRMENTS: Abnormal gait, decreased activity tolerance, decreased balance, decreased endurance, decreased mobility, difficulty walking, decreased ROM, decreased strength, improper body mechanics, and pain   ACTIVITY LIMITATIONS: carrying, lifting, sitting, standing, squatting, stairs, transfers, reach over head, and locomotion level  PARTICIPATION LIMITATIONS: meal prep, cleaning, driving, shopping, community activity, occupation, and yard work  PERSONAL FACTORS: Time since onset of injury/illness/exacerbation and 3+ comorbidities: HTN, DM II, COPD are also affecting patient's functional outcome.   REHAB POTENTIAL: Good  CLINICAL DECISION MAKING: Evolving/moderate complexity  EVALUATION COMPLEXITY: Moderate   GOALS: Goals reviewed with patient? No  SHORT TERM GOALS: Target date: 03/15/2024   Pt will be compliant and knowledgeable with initial HEP for improved comfort and  carryover Baseline: initial HEP given  03/30/24: reports compliance  Goal status: MET  2.  Pt will self report low back and LE pain no greater than 7/10 for improved comfort and functional ability Baseline: 10/10 at worst    03/30/24: 10/10 at worst  05/19/24: 8/10 Goal status: ONGOING    LONG TERM GOALS: Target date: 06/30/2024   Pt will be decrease ODI disability score to no greater than 50% (25/50) as proxy for functional improvement with home ADLs and community activites Baseline: 74% disability (37/50) 04/18/24: 30/50 (60% disability) Goal status: IN PROGRESS  2.  Pt will self report low back and LE pain no greater than 3/10 for improved comfort and functional ability Baseline: 10/10 at worst Goal status: IN PROGRESS   3.  Pt will increase 30 Second Sit to Stand rep count to no less than 10 reps for improved balance, strength, and functional mobility Baseline: 8 reps  03/14/24: 6 reps  03/22/24: 7 reps 04/18/24: 8 reps Goal status: IN PROGRESS   4.  Pt will be able to improve standing and walking activity tolerance to at least 20 minutes for improved functional ability with community navigation Baseline: 10 minutes Goal status: IN PROGRESS   PLAN:  PT FREQUENCY: 2x/week 1 x per week   PT DURATION: 8 weeks  6 weeks   PLANNED INTERVENTIONS: 97164- PT Re-evaluation, 97110-Therapeutic exercises, 97530- Therapeutic activity, [id number]- Neuromuscular re-education, 97535- Self Care, 02859- Manual therapy, [id number]- Aquatic Therapy, H9716- Electrical stimulation (unattended), [id number]- Electrical stimulation (manual), Dry Needling, Cryotherapy, and Moist heat.  PLAN FOR NEXT SESSION: Continue with therapeutic focus on Hamstring strength in full ROM, global BLE strength, dynamic core stability, an hip/lumbar mobility. Try aquatics and get HEP.    Graydon Dingwall, PT, ATRIC Certified Exercise Expert for the Aging Adult  06/15/24 4:37 PM Phone: [phone] Fax: [phone]

## 2024-06-15 ENCOUNTER — Encounter: Payer: Self-pay | Admitting: Physical Therapy

## 2024-06-15 ENCOUNTER — Ambulatory Visit: Payer: Self-pay | Admitting: Physical Therapy

## 2024-06-15 DIAGNOSIS — M6281 Muscle weakness (generalized): Secondary | ICD-10-CM | POA: Diagnosis not present

## 2024-06-15 DIAGNOSIS — R2689 Other abnormalities of gait and mobility: Secondary | ICD-10-CM

## 2024-06-15 DIAGNOSIS — M5459 Other low back pain: Secondary | ICD-10-CM

## 2024-06-16 ENCOUNTER — Other Ambulatory Visit: Payer: Self-pay | Admitting: Family Medicine

## 2024-06-16 DIAGNOSIS — K219 Gastro-esophageal reflux disease without esophagitis: Secondary | ICD-10-CM

## 2024-06-20 ENCOUNTER — Other Ambulatory Visit: Payer: Self-pay | Admitting: Family Medicine

## 2024-06-20 ENCOUNTER — Telehealth: Payer: Self-pay | Admitting: Physical Medicine and Rehabilitation

## 2024-06-20 NOTE — Telephone Encounter (Signed)
 Pt called requesting a script for walker with wheels and chair. Pt states she leaving for [place] this Thursday and need script for medical facility. Please call pt ASA about this matter at [phone].

## 2024-06-20 NOTE — Telephone Encounter (Signed)
 Spoke to patient and she was advised to contact her PCP, per Megan

## 2024-06-21 ENCOUNTER — Other Ambulatory Visit: Payer: Self-pay | Admitting: Family Medicine

## 2024-06-21 ENCOUNTER — Ambulatory Visit: Payer: 59

## 2024-06-21 ENCOUNTER — Telehealth: Payer: Self-pay

## 2024-06-21 DIAGNOSIS — Z0000 Encounter for general adult medical examination without abnormal findings: Secondary | ICD-10-CM | POA: Diagnosis not present

## 2024-06-21 DIAGNOSIS — J441 Chronic obstructive pulmonary disease with (acute) exacerbation: Secondary | ICD-10-CM

## 2024-06-21 MED ORDER — LEVALBUTEROL TARTRATE 45 MCG/ACT IN AERO: INHALATION_SPRAY | RESPIRATORY_TRACT | 1 refills | Status: DC

## 2024-06-21 NOTE — Telephone Encounter (Signed)
 Copied from CRM [id number]. Topic: General - Other >> Jun 20, 2024  4:49 PM Everette C wrote: Reason for CRM: The patient has called to request an order/ prescription for a seated/ rollator walker. Please contact the patient further if/when possible. The patient would like for the medical supply company to work with [employer], the patient has stressed the urgency of their request due to upcoming travel

## 2024-06-21 NOTE — Progress Notes (Signed)
 Subjective:   [name] is a 68 y.o. who presents for a Medicare Wellness preventive visit.  As a reminder, Annual Wellness Visits don't include a physical exam, and some assessments may be limited, especially if this visit is performed virtually. We may recommend an in-person follow-up visit with your provider if needed.  Visit Complete: Virtual I connected with  [name] on 06/21/24 by a audio enabled telemedicine application and verified that I am speaking with the correct person using two identifiers.  Patient Location: Home  Provider Location: Office/Clinic  I discussed the limitations of evaluation and management by telemedicine. The patient expressed understanding and agreed to proceed.  Vital Signs: Because this visit was a virtual/telehealth visit, some criteria may be missing or patient reported. Any vitals not documented were not able to be obtained and vitals that have been documented are patient reported.  VideoError- Librarian, academic were attempted between this provider and patient, however failed, due to patient having technical difficulties OR patient did not have access to video capability.  We continued and completed visit with audio only.   Persons Participating in Visit: Patient.  AWV Questionnaire: No: Patient Medicare AWV questionnaire was not completed prior to this visit.  Cardiac Risk Factors include: advanced age (>79men, >80 women);diabetes mellitus;hypertension     Objective:    Today's Vitals   06/21/24 1446  PainSc: 8    There is no height or weight on file to calculate BMI.     06/21/2024    3:04 PM 02/23/2024    2:51 PM 06/16/2023    3:10 PM 11/30/2022   10:10 AM 06/06/2022    3:35 PM 05/27/2021    2:54 PM 08/30/2020    9:38 AM  Advanced Directives  Does Patient Have a Medical Advance Directive? No No No No No No No  Would patient like information on creating a medical advance directive? No  - Patient declined     Yes (MAU/Ambulatory/Procedural Areas - Information given) Yes (ED - Information included in AVS)    Current Medications (verified) Outpatient Encounter Medications as of 06/21/2024  Medication Sig   acetaminophen  (TYLENOL ) 500 MG tablet Take 1,000 mg by mouth every 6 (six) hours as needed for mild pain (pain score 1-3).   albuterol  (PROVENTIL ) (2.5 MG/3ML) 0.083% nebulizer solution Take 3 mLs (2.5 mg total) by nebulization every 6 (six) hours as needed for wheezing or shortness of breath.   Blood Glucose Monitoring Suppl (ACCU-CHEK AVIVA PLUS) w/Device KIT USE AS DIRECTED TO CHECK BLOOD GLUCOSE 1 TO 2 TIMES DAILY   diphenhydramine-acetaminophen  (TYLENOL  PM) 25-500 MG TABS tablet Take 1 tablet by mouth at bedtime as needed (sleep).   EPINEPHrine  0.3 mg/0.3 mL IJ SOAJ injection Inject 0.3 mg into the muscle as needed for anaphylaxis.   Fluticasone -Umeclidin-Vilant (TRELEGY ELLIPTA ) 200-62.5-25 MCG/ACT AEPB Take 1 Inhalation by mouth daily.   furosemide  (LASIX ) 20 MG tablet Take 1 tablet (20 mg total) by mouth daily.   hydrochlorothiazide  (HYDRODIURIL ) 12.5 MG tablet Take 1 tablet (12.5 mg total) by mouth daily.   levalbuterol  (XOPENEX  HFA) 45 MCG/ACT inhaler INHALE 2 PUFFS BY MOUTH EVERY 4 HOURS AS NEEDED FOR WHEEZING   losartan  (COZAAR ) 25 MG tablet Take 1 tablet by mouth once daily   metFORMIN  (GLUCOPHAGE ) 500 MG tablet Take 1 tablet (500 mg total) by mouth 2 (two) times daily with a meal.   methocarbamol  (ROBAXIN ) 500 MG tablet Take 1 tablet (500 mg total) by mouth  3 (three) times daily.   montelukast  (SINGULAIR ) 10 MG tablet Take 1 tablet (10 mg total) by mouth at bedtime.   ondansetron  (ZOFRAN -ODT) 4 MG disintegrating tablet DISSOLVE 1 TABLET IN MOUTH EVERY 8 HOURS AS NEEDED FOR NAUSEA FOR VOMITING   pantoprazole  (PROTONIX ) 40 MG tablet Take 1 tablet by mouth once daily   tirzepatide  (MOUNJARO ) 5 MG/0.5ML Pen Inject 5 mg into the skin once a week.   traMADol  (ULTRAM ) 50  MG tablet Take 1 tablet (50 mg total) by mouth 2 (two) times daily as needed.   doxycycline  (VIBRA -TABS) 100 MG tablet Take 1 tablet (100 mg total) by mouth 2 (two) times daily. (Patient not taking: Reported on 06/21/2024)   ibuprofen  (ADVIL ) 800 MG tablet TAKE 1 TABLET BY MOUTH EVERY 8 HOURS AS NEEDED FOR MILD PAIN (Patient not taking: Reported on 06/21/2024)   No facility-administered encounter medications on file as of 06/21/2024.    Allergies (verified) Lisinopril    History: Past Medical History:  Diagnosis Date   Arthritis    Asthma    Colonic polyp    GERD (gastroesophageal reflux disease)    Hypertension    Obesity    Pain in limb    Smoker    Spinal stenosis    Swelling of limb    Past Surgical History:  Procedure Laterality Date   BIOPSY BREAST Left 09/22/2018   no maglignancy   CATARACT EXTRACTION Right 05/25/2023   COLONOSCOPY  2009   Dr. Abran   POLYPECTOMY     Family History  Problem Relation Age of Onset   Lung cancer Father    Cirrhosis Mother    Cirrhosis Brother    Colon cancer Neg Hx    Colon polyps Neg Hx    Esophageal cancer Neg Hx    Rectal cancer Neg Hx    Stomach cancer Neg Hx    Social History   Socioeconomic History   Marital status: Widowed    Spouse name: Not on file   Number of children: Not on file   Years of education: Not on file   Highest education level: Not on file  Occupational History   Not on file  Tobacco Use   Smoking status: Former    Current packs/day: 0.25    Average packs/day: 0.3 packs/day for 10.0 years (2.5 ttl pk-yrs)    Types: Cigarettes   Smokeless tobacco: Never   Tobacco comments:    quit oct 2018  Vaping Use   Vaping status: Never Used  Substance and Sexual Activity   Alcohol use: Not Currently    Comment: socially   Drug use: Not Currently    Types: Marijuana    Comment: social   Sexual activity: Not Currently  Other Topics Concern   Not on file  Social History Narrative   Not on file    Social Drivers of Health   Financial Resource Strain: Low Risk  (06/16/2023)   Overall Financial Resource Strain (CARDIA)    Difficulty of Paying Living Expenses: Not hard at all  Food Insecurity: No Food Insecurity (06/21/2024)   Hunger Vital Sign    Worried About Running Out of Food in the Last Year: Never true    Ran Out of Food in the Last Year: Never true  Transportation Needs: No Transportation Needs (06/21/2024)   PRAPARE - Administrator, Civil Service (Medical): No    Lack of Transportation (Non-Medical): No  Physical Activity: Inactive (06/21/2024)   Exercise Vital Sign  Days of Exercise per Week: 0 days    Minutes of Exercise per Session: 0 min  Stress: No Stress Concern Present (06/21/2024)   [employer] of Occupational Health - Occupational Stress Questionnaire    Feeling of Stress: Only a little  Social Connections: Socially Isolated (06/21/2024)   Social Connection and Isolation Panel    Frequency of Communication with Friends and Family: More than three times a week    Frequency of Social Gatherings with Friends and Family: Once a week    Attends Religious Services: Never    Database administrator or Organizations: No    Attends Banker Meetings: Never    Marital Status: Widowed    Tobacco Counseling Counseling given: Not Answered Tobacco comments: quit oct 2018    Clinical Intake:  Pre-visit preparation completed: Yes  Pain : 0-10 Pain Score: 8  Pain Type: Chronic pain Pain Location: Buttocks Pain Descriptors / Indicators: Aching Pain Onset: More than a month ago Pain Frequency: Constant     Nutritional Risks: None Diabetes: Yes CBG done?: No Did pt. bring in CBG monitor from home?: No  Lab Results  Component Value Date   HGBA1C 7.4 (A) 03/21/2024   HGBA1C 7.0 (A) 09/24/2023   HGBA1C 6.8 (A) 09/17/2022     How often do you need to have someone help you when you read instructions, pamphlets, or other written  materials from your doctor or pharmacy?: 1 - Never  Interpreter Needed?: No  Information entered by :: NAllen LPN   Activities of Daily Living     06/21/2024    2:50 PM  In your present state of health, do you have any difficulty performing the following activities:  Hearing? 0  Vision? 0  Difficulty concentrating or making decisions? 0  Walking or climbing stairs? 1  Dressing or bathing? 0  Doing errands, shopping? 0  Preparing Food and eating ? N  Using the Toilet? N  In the past six months, have you accidently leaked urine? N  Do you have problems with loss of bowel control? N  Managing your Medications? N  Managing your Finances? N  Housekeeping or managing your Housekeeping? N    Patient Care Team: Joyce Norleen BROCKS, MD as PCP - General (Family Medicine) Suzie Leader as [facility] Care Management [place], Jon DEL, RPH (Pharmacist) [facility], [place]  I have updated your Care Teams any recent Medical Services you may have received from other providers in the past year.     Assessment:   This is a routine wellness examination for [name].  Hearing/Vision screen Hearing Screening - Comments:: Denies hearing issues Vision Screening - Comments:: Regular eye exams, [facility]   Goals Addressed             This Visit's Progress    Patient Stated       06/21/2024, wants to lose weight, get to 200 pounds       Depression Screen     06/21/2024    3:07 PM 06/16/2023    3:12 PM 09/17/2022    2:49 PM 06/06/2022    3:38 PM 11/07/2021    2:55 PM 08/30/2020    9:41 AM 05/25/2018   10:06 AM  PHQ 2/9 Scores  PHQ - 2 Score 4 1 0 0 0 0 0  PHQ- 9 Score 4 5  4        Fall Risk     06/21/2024    3:05 PM 03/29/2024  2:17 PM 06/16/2023    3:11 PM 09/17/2022    2:49 PM 06/06/2022    3:37 PM  Fall Risk   Falls in the past year? 0 0 0 0 0  Number falls in past yr: 0 0 0 0 0  Injury with Fall? 0 0 0 0 0  Risk for fall due to : Impaired  mobility;Impaired balance/gait;Medication side effect No Fall Risks Medication side effect No Fall Risks Medication side effect  Follow up Falls prevention discussed;Falls evaluation completed Falls evaluation completed Falls prevention discussed;Falls evaluation completed Falls evaluation completed  Falls evaluation completed;Education provided;Falls prevention discussed      Data saved with a previous flowsheet row definition    MEDICARE RISK AT HOME:  Medicare Risk at Home Any stairs in or around the home?: Yes If so, are there any without handrails?: No Home free of loose throw rugs in walkways, pet beds, electrical cords, etc?: Yes Adequate lighting in your home to reduce risk of falls?: Yes Life alert?: No Use of a cane, walker or w/c?: Yes Grab bars in the bathroom?: Yes Shower chair or bench in shower?: Yes Elevated toilet seat or a handicapped toilet?: No  TIMED UP AND GO:  Was the test performed?  No  Cognitive Function: 6CIT completed        06/21/2024    3:08 PM 06/16/2023    3:13 PM 06/06/2022    3:42 PM  6CIT Screen  What Year? 0 points 0 points 0 points  What month? 0 points 0 points 0 points  What time? 0 points 0 points 0 points  Count back from 20 0 points 0 points 0 points  Months in reverse 0 points 0 points 0 points  Repeat phrase 0 points 0 points 2 points  Total Score 0 points 0 points 2 points    Immunizations Immunization History  Administered Date(s) Administered   Hepatitis B 08/13/2012, 09/10/2012   Hepatitis B, ADULT 09/28/2013   Moderna Covid-19 Vaccine Bivalent Booster 25yrs & up 11/07/2021   Moderna SARS-COV2 Booster Vaccination 11/08/2020   Moderna Sars-Covid-2 Vaccination 03/08/2020, 04/03/2020   PPD Test 08/08/2014, 09/10/2015, 09/01/2016   Pfizer(Comirnaty)Fall Seasonal Vaccine 12 years and older 11/06/2022, 09/24/2023   Pneumococcal Conjugate-13 03/06/2017   Pneumococcal Polysaccharide-23 08/13/2012   Tdap 08/13/2012   Zoster  Recombinant(Shingrix) 01/12/2017, 07/18/2017   Zoster, Live 10/10/2014    Screening Tests Health Maintenance  Topic Date Due   Pneumococcal Vaccine: 50+ Years (3 of 3 - PCV20 or PCV21) 08/13/2017   Colonoscopy  07/01/2021   DTaP/Tdap/Td (2 - Td or Tdap) 08/13/2022   COVID-19 Vaccine (7 - 2024-25 season) 03/24/2024   OPHTHALMOLOGY EXAM  06/28/2024   INFLUENZA VACCINE  07/01/2024   HEMOGLOBIN A1C  09/20/2024   Diabetic kidney evaluation - eGFR measurement  09/23/2024   Diabetic kidney evaluation - Urine ACR  09/23/2024   FOOT EXAM  09/23/2024   MAMMOGRAM  12/19/2024   Medicare Annual Wellness (AWV)  06/21/2025   Hepatitis B Vaccines  Completed   DEXA SCAN  Completed   Hepatitis C Screening  Completed   Zoster Vaccines- Shingrix  Completed   HPV VACCINES  Aged Out   Meningococcal B Vaccine  Aged Out    Health Maintenance  Health Maintenance Due  Topic Date Due   Pneumococcal Vaccine: 50+ Years (3 of 3 - PCV20 or PCV21) 08/13/2017   Colonoscopy  07/01/2021   DTaP/Tdap/Td (2 - Td or Tdap) 08/13/2022   COVID-19 Vaccine (7 -  2024-25 season) 03/24/2024   Health Maintenance Items Addressed: Due for pneumonia and TDAP vaccine. Patient to schedule colonoscopy.  Additional Screening:  Vision Screening: Recommended annual ophthalmology exams for early detection of glaucoma and other disorders of the eye. Would you like a referral to an eye doctor? No    Dental Screening: Recommended annual dental exams for proper oral hygiene  Community Resource Referral / Chronic Care Management: CRR required this visit?  No   CCM required this visit?  No   Plan:    I have personally reviewed and noted the following in the patient's chart:   Medical and social history Use of alcohol, tobacco or illicit drugs  Current medications and supplements including opioid prescriptions. Patient is not currently taking opioid prescriptions. Functional ability and status Nutritional  status Physical activity Advanced directives List of other physicians Hospitalizations, surgeries, and ER visits in previous 12 months Vitals Screenings to include cognitive, depression, and falls Referrals and appointments  In addition, I have reviewed and discussed with patient certain preventive protocols, quality metrics, and best practice recommendations. A written personalized care plan for preventive services as well as general preventive health recommendations were provided to patient.   Ardella FORBES Dawn, LPN   [id number]   After Visit Summary: (MyChart) Due to this being a telephonic visit, the after visit summary with patients personalized plan was offered to patient via MyChart   Notes: Nothing significant to report at this time.

## 2024-06-21 NOTE — Patient Instructions (Signed)
 Ms. [name] , Thank you for taking time out of your busy schedule to complete your Annual Wellness Visit with me. I enjoyed our conversation and look forward to speaking with you again next year. I, as well as your care team,  appreciate your ongoing commitment to your health goals. Please review the following plan we discussed and let me know if I can assist you in the future. Your Game plan/ To Do List    Referrals: If you haven't heard from the office you've been referred to, please reach out to them at the phone provided.  N/a Follow up Visits: Next Medicare AWV with our clinical staff: 06/27/2025 at 2:50   Have you seen your provider in the last 6 months (3 months if uncontrolled diabetes)? Yes Next Office Visit with your provider: 10/04/2024 at 3:15  Clinician Recommendations:  Aim for 30 minutes of exercise or brisk walking, 6-8 glasses of water, and 5 servings of fruits and vegetables each day.       This is a list of the screening recommended for you and due dates:  Health Maintenance  Topic Date Due   Pneumococcal Vaccine for age over 75 (3 of 3 - PCV20 or PCV21) 08/13/2017   Colon Cancer Screening  07/01/2021   DTaP/Tdap/Td vaccine (2 - Td or Tdap) 08/13/2022   COVID-19 Vaccine (7 - 2024-25 season) 03/24/2024   Eye exam for diabetics  06/28/2024   Flu Shot  07/01/2024   Hemoglobin A1C  09/20/2024   Yearly kidney function blood test for diabetes  09/23/2024   Yearly kidney health urinalysis for diabetes  09/23/2024   Complete foot exam   09/23/2024   Mammogram  12/19/2024   Medicare Annual Wellness Visit  06/21/2025   Hepatitis B Vaccine  Completed   DEXA scan (bone density measurement)  Completed   Hepatitis C Screening  Completed   Zoster (Shingles) Vaccine  Completed   HPV Vaccine  Aged Out   Meningitis B Vaccine  Aged Out    Advanced directives: (ACP Link)Information on Advanced Care Planning can be found at [place]  Secretary of [facility] Advance Health Care  Directives Advance Health Care Directives. [email or web address]  Advance Care Planning is important because it:  [x]  Makes sure you receive the medical care that is consistent with your values, goals, and preferences  [x]  It provides guidance to your family and loved ones and reduces their decisional burden about whether or not they are making the right decisions based on your wishes.  Follow the link provided in your after visit summary or read over the paperwork we have mailed to you to help you started getting your Advance Directives in place. If you need assistance in completing these, please reach out to us  so that we can help you!  See attachments for Preventive Care and Fall Prevention Tips.

## 2024-06-23 ENCOUNTER — Encounter: Payer: Self-pay | Admitting: Family Medicine

## 2024-06-27 ENCOUNTER — Ambulatory Visit: Payer: Self-pay

## 2024-06-27 ENCOUNTER — Other Ambulatory Visit: Payer: Self-pay | Admitting: Family Medicine

## 2024-06-27 DIAGNOSIS — M5459 Other low back pain: Secondary | ICD-10-CM | POA: Diagnosis not present

## 2024-06-27 DIAGNOSIS — M6281 Muscle weakness (generalized): Secondary | ICD-10-CM | POA: Diagnosis not present

## 2024-06-27 DIAGNOSIS — E118 Type 2 diabetes mellitus with unspecified complications: Secondary | ICD-10-CM

## 2024-06-27 DIAGNOSIS — R2689 Other abnormalities of gait and mobility: Secondary | ICD-10-CM

## 2024-06-27 NOTE — Therapy (Signed)
 OUTPATIENT PHYSICAL THERAPY TREATMENT/DISCHARGE  PHYSICAL THERAPY DISCHARGE SUMMARY  Visits from Start of Care: 15  Current functional level related to goals / functional outcomes: See goals and objective   Remaining deficits: See goals and objective   Education / Equipment: HEP   Patient agrees to discharge. Patient goals were met. Patient is being discharged due to maximized rehab potential.    Patient Name: [name] MRN: [id number] DOB:[birth year], 68 y.o., female Today's Date: 06/27/2024  END OF SESSION:  PT End of Session - 06/27/24 1445     Visit Number 15    Number of Visits 23    Date for PT Re-Evaluation 06/30/24    Authorization Type [employer] Dual Complete    PT Start Time 1445    PT Stop Time 1528    PT Time Calculation (min) 43 min    Activity Tolerance Patient tolerated treatment well    Behavior During Therapy WFL for tasks assessed/performed                 Past Medical History:  Diagnosis Date   Arthritis    Asthma    Colonic polyp    GERD (gastroesophageal reflux disease)    Hypertension    Obesity    Pain in limb    Smoker    Spinal stenosis    Swelling of limb    Past Surgical History:  Procedure Laterality Date   BIOPSY BREAST Left 09/22/2018   no maglignancy   CATARACT EXTRACTION Right 05/25/2023   COLONOSCOPY  2009   Dr. Abran   POLYPECTOMY     Patient Active Problem List   Diagnosis Date Noted   Myopathy, unspecified 07/15/2023   COPD (chronic obstructive pulmonary disease) (HCC) 11/30/2022   Stage 3a chronic kidney disease (HCC) 01/23/2021   History of cataract surgery 04/27/2020   Multiple somatic complaints 01/10/2019   Adenomatous polyp of colon 05/25/2018   Asthma, chronic, mild persistent, uncomplicated 03/06/2017   Chronic seasonal allergic rhinitis due to pollen 03/06/2017   Gastroesophageal reflux disease without esophagitis 10/10/2014   Ex-cigarette smoker 09/28/2013   Type 2 diabetes mellitus with  complications (HCC) 07/12/2012   Obesity (BMI 30-39.9) 07/12/2012   Chronic back pain 07/12/2012   Hypertension associated with diabetes (HCC) 07/12/2012    PCP: Joyce Norleen BROCKS, MD  REFERRING PROVIDER: Bulah Alm RAMAN, PA-C   REFERRING DIAG: [phone] (ICD-10-CM) - Chronic low back pain, unspecified back pain laterality, unspecified whether sciatica present   Rationale for Evaluation and Treatment: Rehabilitation  THERAPY DIAG:  Other low back pain  Muscle weakness (generalized)  Other abnormalities of gait and mobility  ONSET DATE: Chronic  SUBJECTIVE:  SUBJECTIVE STATEMENT: Pt presents to PT noting she is feeling pretty well. Feels pretty good about aquatic program and feels ready to discharge.   EVAL: Pt presents to PT with reports of chronic LBP with refers into posterior LE, R>L. Pt is well known to clinic, has had past success to PT. Denies bowel/bladder changes or saddle anesthesia. Also denies N/T down LE and pain does not go distal to knee. Has had a lot of issues with LE cramping lately, also is frustrated by her inability to walk or stand longer than 10 minutes.   PERTINENT HISTORY:  HTN, DM II, COPD  PAIN:  Are you having pain?  Yes: NPRS scale: 8/10 Pain location: lower back R>L, posterior hip R>L Pain description: sharp, tight, sore Aggravating factors: mornings, prolonged standing, walking Relieving factors: massage, medication  PRECAUTIONS: None  RED FLAGS: None   WEIGHT BEARING RESTRICTIONS: No  FALLS:  Has patient fallen in last 6 months? No  LIVING ENVIRONMENT: Lives with: lives alone Lives in: House/apartment Stairs: Yes: External: 12 steps; on right going up Has following equipment at home: Single point cane and Walker - 2 wheeled  OCCUPATION: Retired  - Does [employer] a few times a week  PLOF: Independent  PATIENT GOALS: pt wants to decrease pain in her lower back to improve comfort with walking and shopping  NEXT MD VISIT: 03/01/2024  OBJECTIVE:  Note: Objective measures were completed at Evaluation unless otherwise noted.  DIAGNOSTIC FINDINGS:  See imaging  PATIENT SURVEYS:  ODI: 74% disability (37/50) 04/18/24: 30/50 (60% disability) 06/27/24: 21/50 (42% disability)  COGNITION: Overall cognitive status: Within functional limits for tasks assessed     SENSATION: [facility]  MUSCLE LENGTH: Thomas test: Right (+); Left (+)  POSTURE: rounded shoulders, forward head, and increased lumbar lordosis  PALPATION: TTP to bilateral lumbar paraspinals  LUMBAR ROM:   AROM eval  Flexion 50%  Extension 25%  Right lateral flexion   Left lateral flexion   Right rotation   Left rotation    (Blank rows = not tested)  LOWER EXTREMITY MMT:    MMT Right eval Left eval  Hip flexion 3+/5 3+/5  Hip extension    Hip abduction 3+/5 3+/5  Hip adduction    Hip internal rotation    Hip external rotation    Knee flexion 4/5 4/5  Knee extension 4/5 4/5  Ankle dorsiflexion    Ankle plantarflexion    Ankle inversion    Ankle eversion     (Blank rows = not tested)  LUMBAR SPECIAL TESTS:  Straight leg raise test: Negative and Slump test: Negative  FUNCTIONAL TESTS:  30 Second Sit to Stand: 8 reps 03/14/24: 6 reps  03/22/24: 7 reps 04/18/24: 8 reps 06/27/24: 10 reps  GAIT: Distance walked: 93ft Assistive device utilized: Single point cane Level of assistance: SBA Comments: trunk flexed, decreased gait speed  TREATMENT: [facility] Adult PT Treatment:                                                DATE: 06/27/24 Therapeutic Exercise: Modified thomas stretch x 60 ea LTR x 5 ea Seated fig 4 stretch x 30 ea Seated piriform stretch x 30 ea Seated hamstring stretch 2x30 ea Therapeutic Activity: Assessments of  tests/measures, goals, and outcomes for discharge  PATIENT EDUCATION:  Education details: continue HEP Person educated: Patient  Education method: Explanation, Demonstration, and Handouts Education comprehension: verbalized understanding and returned demonstration  HOME EXERCISE PROGRAM: Access Code: [email or web address] URL: [email or web address] Date: 06/27/2024 Prepared by: Alm Kingdom  Exercises - Modified Debby Stretch  - 1-2 x daily - 7 x weekly - 2 reps - 30 sec hold - Supine Lower Trunk Rotation  - 1-2 x daily - 7 x weekly - 2 sets - 10 reps - Seated Figure 4 Piriformis Stretch  - 1-2 x daily - 7 x weekly - 2 reps - 30 sec hold - Seated Piriformis Stretch  - 1 x daily - 7 x weekly - 2 sets - 30 sec hold - Seated Hamstring Stretch  - 1-2 x daily - 7 x weekly - 2 reps - 30 sec hold   Aquatic HEP Access Code: [email or web address] URL: [email or web address] Date: 06/14/2024 Prepared by: Graydon Dingwall  Exercises - Cat Cow in [place] with Pool Noodle  - 1 x daily - 7 x weekly - 3 sets - 10 reps - Holding wall - hip abduction  - 1 x daily - 7 x weekly - 3 sets - 10 reps - Hip Swing  - 1 x daily - 7 x weekly - 3 sets - 10 reps - Standing Shoulder Horizontal Abduction With Dumbbells  - 1 x daily - 7 x weekly - 3 sets - 10 reps - Farmer's Walk  - 1 x daily - 7 x weekly - 3 sets - 10 reps - Step Up  - 1 x daily - 7 x weekly - 3 sets - 10 reps - Sit to Stand Without Arm Support  - 1 x daily - 7 x weekly - 3 sets - 10 reps - Lunge to [employer] at [employer]  - 1 x daily - 7 x weekly - 3 sets - 10 reps - Side Stepping with Hand Floats  - 1 x daily - 7 x weekly - 3 sets - 10 reps - Standing Hip Circles with Noodle at [employer]  - 1 x daily - 7 x weekly - 3 sets - 10 reps - Standing Hip Internal and External Rotation  - 1 x daily - 7 x weekly - 3 sets - 10 reps - Seated Straddle on Flotation Forward Breast Stroke Arms and Bicycle Legs  - 1 x daily - 7 x weekly - 3 sets - 10  reps - 3 -way Leg Stretch with Pool Noodle  - 1 x daily - 7 x weekly - 3 sets - 10 reps  ASSESSMENT:  CLINICAL IMPRESSION: Pt was able to complete prescribed exercises with no adverse effect and demonstrated knowledge of HEP. Over the course of PT treatment she has progressed fairly well and liked the extension using aquatic exercises and setting up home program. Pt today showed improvement in ODI and 30 Second Sit to Stand meeting LTGs for subjective functional improvement and mobility. She should continue to improve with HEP compliance and is being discharged from skilled therapy at this time.   OBJECTIVE IMPAIRMENTS: Abnormal gait, decreased activity tolerance, decreased balance, decreased endurance, decreased mobility, difficulty walking, decreased ROM, decreased strength, improper body mechanics, and pain   ACTIVITY LIMITATIONS: carrying, lifting, sitting, standing, squatting, stairs, transfers, reach over head, and locomotion level  PARTICIPATION LIMITATIONS: meal prep, cleaning, driving, shopping, community activity, occupation, and yard work  PERSONAL FACTORS: Time since onset of injury/illness/exacerbation and 3+ comorbidities: HTN, DM II, COPD are also affecting patient's functional outcome.   REHAB POTENTIAL: Good  CLINICAL DECISION  MAKING: Evolving/moderate complexity  EVALUATION COMPLEXITY: Moderate   GOALS: Goals reviewed with patient? No  SHORT TERM GOALS: Target date: 03/15/2024   Pt will be compliant and knowledgeable with initial HEP for improved comfort and carryover Baseline: initial HEP given  03/30/24: reports compliance  Goal status: MET  2.  Pt will self report low back and LE pain no greater than 7/10 for improved comfort and functional ability Baseline: 10/10 at worst    03/30/24: 10/10 at worst  05/19/24: 8/10 at worst 06/27/24: 4/10 at worst Goal status: MET   LONG TERM GOALS: Target date: 06/30/2024   Pt will be decrease ODI disability score to no  greater than 50% (25/50) as proxy for functional improvement with home ADLs and community activites Baseline: 74% disability (37/50) 04/18/24: 30/50 (60% disability) 06/27/24: 21/50 (42% disability) Goal status: MET  2.  Pt will self report low back and LE pain no greater than 3/10 for improved comfort and functional ability Baseline: 10/10 at worst 06/27/24: 4/10 at worst Goal status: IN PROGRESS   3.  Pt will increase 30 Second Sit to Stand rep count to no less than 10 reps for improved balance, strength, and functional mobility Baseline: 8 reps  03/14/24: 6 reps  03/22/24: 7 reps 04/18/24: 8 reps 06/27/24: 10 reps Goal status: MET   4.  Pt will be able to improve standing and walking activity tolerance to at least 20 minutes for improved functional ability with community navigation Baseline: 10 minutes 06/27/24: 15 minutes Goal status: PARTIALLY MET   PLAN:  PT FREQUENCY: 2x/week 1 x per week   PT DURATION: 8 weeks  6 weeks   PLANNED INTERVENTIONS: 97164- PT Re-evaluation, 97110-Therapeutic exercises, 97530- Therapeutic activity, 97112- Neuromuscular re-education, 97535- Self Care, 02859- Manual therapy, [id number]- Aquatic Therapy, H9716- Electrical stimulation (unattended), [id number]- Electrical stimulation (manual), Dry Needling, Cryotherapy, and Moist heat.  PLAN FOR NEXT SESSION: Continue with therapeutic focus on Hamstring strength in full ROM, global BLE strength, dynamic core stability, an hip/lumbar mobility. Try aquatics and get HEP.   Alm JAYSON Kingdom PT  06/27/24 3:42 PM

## 2024-07-04 ENCOUNTER — Ambulatory Visit (INDEPENDENT_AMBULATORY_CARE_PROVIDER_SITE_OTHER): Admitting: Podiatry

## 2024-07-04 ENCOUNTER — Encounter: Payer: Self-pay | Admitting: Podiatry

## 2024-07-04 DIAGNOSIS — L609 Nail disorder, unspecified: Secondary | ICD-10-CM

## 2024-07-04 NOTE — Progress Notes (Unsigned)
     Chief Complaint  Patient presents with   Nail Problem    Pt. Had left foot great toe PNA procedure done 05/2023. Pt concern with brown discoloration to toenail.NIDDM A1C 7.4. Pt. Using fungi cure topically to nails. 0 pain at present.   HPI: 68 y.o. female presents today after having a PNA procedure to the left hallux medial nail border last year.  She had followed up with Dr. Magdalen within 2 months of that procedure and states that he clipped all around the nail very aggressively and made it look worse.  She was then hesitant to return.  She feels that the nail has looked abnormal ever since then.  Does not relate any significant pain to the toe but is wondering if this will ever look normal again.  She also has concern of dark streaks/discoloration on the right 1st and 2nd toe nails.  Past Medical History:  Diagnosis Date   Arthritis    Asthma    Colonic polyp    GERD (gastroesophageal reflux disease)    Hypertension    Obesity    Pain in limb    Smoker    Spinal stenosis    Swelling of limb    Past Surgical History:  Procedure Laterality Date   BIOPSY BREAST Left 09/22/2018   no maglignancy   CATARACT EXTRACTION Right 05/25/2023   COLONOSCOPY  2009   Dr. Abran   POLYPECTOMY     Allergies  Allergen Reactions   Lisinopril  Swelling    Physical Exam: Palpable pedal pulses.  There is some abnormality to the left great toenail.  Proximal portion appears normal.  No signs of recurrence of growth of the nail border are noted.  Right 1st and 2nd toenails have brown hyperpigmentation noted.  This does not involve the entire nail at this time.  This does not extend to the surrounding skin.  Epicritic sensation is intact.  Assessment/Plan of Care: 1. Abnormality of nail surface     The left hallux nail was debrided and trimmed to a neat appearance.  Patient was informed that the chemical will often seep underneath the existing nail and create some temporary abnormality which will  continue to grow out with the nail.  The proximal portion of the nail is within normal limits so this is a promising sign that the nail will eventually look normal again.  Inform patient that the discoloration on the right 1st and 2nd toenails are melanin pigment.  Discussed how common this is in darker skinned individuals.  As long as the discoloration remains in the nail/nailbed itself, this is completely normal and benign.  If it begins to extend in the same line onto the adjacent skin, then would recommend a biopsy to rule out any melanoma process.  In the meantime, she can purchase over-the-counter Optinail solution to apply to the toenails as a nail brightener  Follow-up as needed   Awanda CHARM Imperial, DPM, FACFAS [facility]     [address] [place], [place] [address]                Office [phone]  Fax [phone]

## 2024-07-06 ENCOUNTER — Encounter: Payer: Self-pay | Admitting: Family Medicine

## 2024-07-06 ENCOUNTER — Ambulatory Visit: Admitting: Family Medicine

## 2024-07-06 ENCOUNTER — Telehealth: Payer: Self-pay | Admitting: Physical Therapy

## 2024-07-06 VITALS — BP 128/60 | HR 68 | Ht 66.0 in | Wt 213.8 lb

## 2024-07-06 DIAGNOSIS — G8929 Other chronic pain: Secondary | ICD-10-CM | POA: Diagnosis not present

## 2024-07-06 DIAGNOSIS — M5441 Lumbago with sciatica, right side: Secondary | ICD-10-CM

## 2024-07-06 NOTE — Telephone Encounter (Signed)
 Returned pt call but there was no answer and mailbox was full unable to LVM   Javonne Louissaint PT, DPT, LAT, ATC  07/06/24  3:35 PM

## 2024-07-06 NOTE — Progress Notes (Signed)
   Subjective:    Patient ID: [name] [name], female    DOB: 01-07-1956, 68 y.o.   MRN: [id number]  HPI She is here for consult concerning getting a walker.  She has had difficulty with radicular back pain and has had 2 epidurals.  The most recent one was June 3.  She states that she did not get any relief from this but does say that she is roughly 40% better because she has been doing physical therapy as well as taking Tylenol  on a regular basis and occasionally using ibuprofen .  She has been involved in physical therapy and recently finished that.  [facility].  She mainly complains of difficulty with walking however review of the physical therapy notes does not mention anything about getting a walker.   Review of Systems     Objective:    Physical Exam Alert and in no distress otherwise not examined  The last physical therapy note was reviewed and there was no comment made about evaluation for a walker.     Assessment & Plan:  Chronic bilateral low back pain with right-sided sciatica I explained that I could not write for a walker to get coverage from us  because I need some documentation of true need  by physical therapy/Occupational Therapy for the need.  I left a message for physical therapy to decide whether a referral should be made or if he has enough information to make a call on this.

## 2024-07-06 NOTE — Patient Instructions (Signed)
 You can take painquilregularly and you can take 800 mg of ibuprofen  3 times per day if you need to for pain relief.  Do the physical therapy.   Okay let me finish call Dr. Lyda office and find out about getting another shot

## 2024-07-07 ENCOUNTER — Other Ambulatory Visit: Payer: Self-pay | Admitting: Family Medicine

## 2024-07-07 ENCOUNTER — Encounter

## 2024-07-07 DIAGNOSIS — Z1231 Encounter for screening mammogram for malignant neoplasm of breast: Secondary | ICD-10-CM

## 2024-07-14 ENCOUNTER — Ambulatory Visit
Admission: RE | Admit: 2024-07-14 | Discharge: 2024-07-14 | Disposition: A | Discharge status: Home / Self Care | Source: Ambulatory Visit | Attending: Family Medicine | Admitting: Family Medicine

## 2024-07-14 DIAGNOSIS — Z1231 Encounter for screening mammogram for malignant neoplasm of breast: Secondary | ICD-10-CM | POA: Diagnosis not present

## 2024-07-20 ENCOUNTER — Other Ambulatory Visit: Payer: Self-pay | Admitting: Physical Medicine and Rehabilitation

## 2024-07-24 ENCOUNTER — Other Ambulatory Visit: Payer: Self-pay | Admitting: Family Medicine

## 2024-07-24 DIAGNOSIS — I10 Essential (primary) hypertension: Secondary | ICD-10-CM

## 2024-08-02 ENCOUNTER — Other Ambulatory Visit: Payer: Self-pay | Admitting: Family Medicine

## 2024-08-24 ENCOUNTER — Other Ambulatory Visit: Payer: Self-pay | Admitting: Family Medicine

## 2024-08-24 DIAGNOSIS — I10 Essential (primary) hypertension: Secondary | ICD-10-CM

## 2024-08-27 ENCOUNTER — Other Ambulatory Visit: Payer: Self-pay | Admitting: Family Medicine

## 2024-08-27 DIAGNOSIS — E118 Type 2 diabetes mellitus with unspecified complications: Secondary | ICD-10-CM

## 2024-08-29 NOTE — Telephone Encounter (Signed)
 Is this okay to refill?

## 2024-08-29 NOTE — Telephone Encounter (Signed)
 Nov 4th

## 2024-09-14 ENCOUNTER — Other Ambulatory Visit: Payer: Self-pay | Admitting: Family Medicine

## 2024-09-14 DIAGNOSIS — J441 Chronic obstructive pulmonary disease with (acute) exacerbation: Secondary | ICD-10-CM

## 2024-09-21 NOTE — Progress Notes (Addendum)
 [name]                                          MRN: [id number]   09/21/2024   The [facility] Quality Team Specialist reviewed this patient medical record for the purposes of chart review for care gap closure. The following were reviewed: abstraction for care gap closure-glycemic status assessment.[name]                                          MRN: [id number]   10/03/2024   The [facility] Quality Team Specialist reviewed this patient medical record for the purposes of chart review for care gap closure. The following were reviewed: chart review for care gap closure-kidney health evaluation for diabetes:eGFR  and uACR.    [facility] Quality Team

## 2024-09-26 ENCOUNTER — Other Ambulatory Visit: Payer: Self-pay | Admitting: Family Medicine

## 2024-09-26 DIAGNOSIS — I10 Essential (primary) hypertension: Secondary | ICD-10-CM

## 2024-10-03 ENCOUNTER — Ambulatory Visit: Payer: Self-pay

## 2024-10-03 ENCOUNTER — Encounter: Payer: Self-pay | Admitting: Radiology

## 2024-10-03 DIAGNOSIS — E1169 Type 2 diabetes mellitus with other specified complication: Secondary | ICD-10-CM | POA: Insufficient documentation

## 2024-10-03 NOTE — Telephone Encounter (Signed)
 FYI Only or Action Required?: FYI only for provider: appointment scheduled on 10/11/24.  Patient was last seen in primary care on 07/06/2024 by Joyce Norleen BROCKS, MD.  Called Nurse Triage reporting Leg Pain.  Symptoms began several months ago.  Interventions attempted: Rest, hydration, or home remedies and Ice/heat application.  Symptoms are: unchanged.  Triage Disposition: See PCP Within 2 Weeks  Patient/caregiver understands and will follow disposition?: Yes    Copied from CRM [id number]. Topic: Clinical - Red Word Triage >> Oct 03, 2024  9:25 AM Gustabo D wrote: Pt says the back of her legs are killing her like someone kicked her in them Reason for Disposition  Leg pain or muscle cramp is a chronic symptom (recurrent or ongoing AND present > 4 weeks)  Answer Assessment - Initial Assessment Questions Pt states that she only called to reschedule her medicare wellness exam. Rn explained that RN could get her scheduled for an acute visit for the leg pain but the wellness exam will have to be a different exam. Pt states this has been ongoing, she has been getting shots for the pain and water aerobics helps. She states she thinks its muscular. Been rubbing it down with alcohol. RN did schedule patient for acute visit. Pt requested visit for next week. She needs to reschedule her medicare wellness exam please.     1. ONSET: When did the pain start?      ongoing 2. LOCATION: Where is the pain located?      Back of thighs, right worse than left 3. PAIN: How bad is the pain?    (Scale 1-10; or mild, moderate, severe)     moderate  5. CAUSE: What do you think is causing the leg pain?     Muscle pain 6. OTHER SYMPTOMS: Do you have any other symptoms? (e.g., chest pain, back pain, breathing difficulty, swelling, rash, fever, numbness, weakness)     no  Protocols used: Leg Pain-A-AH

## 2024-10-03 NOTE — Telephone Encounter (Signed)
 Please reschedule patient AWV that she has scheduled coming up

## 2024-10-04 ENCOUNTER — Ambulatory Visit: Payer: 59 | Admitting: Family Medicine

## 2024-10-06 ENCOUNTER — Other Ambulatory Visit: Payer: Self-pay | Admitting: Family Medicine

## 2024-10-06 DIAGNOSIS — J441 Chronic obstructive pulmonary disease with (acute) exacerbation: Secondary | ICD-10-CM

## 2024-10-11 ENCOUNTER — Encounter: Payer: Self-pay | Admitting: Family Medicine

## 2024-10-11 ENCOUNTER — Ambulatory Visit (INDEPENDENT_AMBULATORY_CARE_PROVIDER_SITE_OTHER): Admitting: Family Medicine

## 2024-10-11 ENCOUNTER — Ambulatory Visit: Admitting: Family Medicine

## 2024-10-11 VITALS — BP 122/68 | HR 72 | Ht 65.0 in | Wt 206.8 lb

## 2024-10-11 DIAGNOSIS — E118 Type 2 diabetes mellitus with unspecified complications: Secondary | ICD-10-CM | POA: Diagnosis not present

## 2024-10-11 DIAGNOSIS — E785 Hyperlipidemia, unspecified: Secondary | ICD-10-CM

## 2024-10-11 DIAGNOSIS — M545 Low back pain, unspecified: Secondary | ICD-10-CM

## 2024-10-11 DIAGNOSIS — Z0000 Encounter for general adult medical examination without abnormal findings: Secondary | ICD-10-CM

## 2024-10-11 DIAGNOSIS — J438 Other emphysema: Secondary | ICD-10-CM

## 2024-10-11 DIAGNOSIS — E669 Obesity, unspecified: Secondary | ICD-10-CM

## 2024-10-11 DIAGNOSIS — G8929 Other chronic pain: Secondary | ICD-10-CM

## 2024-10-11 DIAGNOSIS — Z23 Encounter for immunization: Secondary | ICD-10-CM

## 2024-10-11 DIAGNOSIS — E1169 Type 2 diabetes mellitus with other specified complication: Secondary | ICD-10-CM

## 2024-10-11 DIAGNOSIS — I152 Hypertension secondary to endocrine disorders: Secondary | ICD-10-CM

## 2024-10-11 DIAGNOSIS — N1831 Chronic kidney disease, stage 3a: Secondary | ICD-10-CM

## 2024-10-11 DIAGNOSIS — J4530 Mild persistent asthma, uncomplicated: Secondary | ICD-10-CM

## 2024-10-11 DIAGNOSIS — E1159 Type 2 diabetes mellitus with other circulatory complications: Secondary | ICD-10-CM

## 2024-10-11 DIAGNOSIS — D126 Benign neoplasm of colon, unspecified: Secondary | ICD-10-CM | POA: Diagnosis not present

## 2024-10-11 DIAGNOSIS — Z9849 Cataract extraction status, unspecified eye: Secondary | ICD-10-CM

## 2024-10-11 DIAGNOSIS — K219 Gastro-esophageal reflux disease without esophagitis: Secondary | ICD-10-CM

## 2024-10-11 LAB — POCT GLYCOSYLATED HEMOGLOBIN (HGB A1C): Hemoglobin A1C: 5.7 % — AB (ref 4.0–5.6)

## 2024-10-11 LAB — LIPID PANEL

## 2024-10-11 NOTE — Progress Notes (Unsigned)
 Complete physical exam  Patient: [name]   DOB: 1956/08/26   68 y.o. Female  MRN: [id number]  Subjective:    Chief Complaint  Patient presents with   Annual Exam    Annual exam, had AWV 06/21/24. Still having leg pain.     [name] is a 68 y.o. female who presents today for a complete physical exam.  She reports consuming a general diet. Exercise daily walking. She generally feels well. She reports sleeping poorly. Discussed the use of AI scribe software for clinical note transcription with the patient, who gave verbal consent to proceed.   She experiences pain in her buttock and down the back of her leg. The pain worsens with sitting, especially on hard surfaces, and improves with soft seating. She describes the pain as nerve-related, affecting her ability to sit comfortably and walk upright. She has received two injections for this pain, which provided minimal relief, and has participated in physical therapy and pool aerobics, which offered some temporary relief.  She is currently taking Mounjaro  5.0 mg for diabetes and has lost weight, now weighing 196 pounds from 213 pounds in August. She takes metformin  intermittently, either once or twice a day, and her A1c is 5.7. She celebrated her weight loss with champagne, which caused a temporary weight gain.  Her allergies were bothersome during the summer, causing sneezing and runny eyes, but she reports no current symptoms. She takes montelukast  regularly and uses a rescue inhaler once to three times a week for asthma, in addition to Trelegy daily. She occasionally uses a nebulizer when she has a cold.  She takes hydrochlorothiazide  and losartan  for blood pressure. For indigestion, she uses Protonix  regularly and reports adequate control of symptoms. She recently restarted taking iron supplements and is considering taking magnesium  and a multivitamin.  She has not yet completed a colonoscopy due to logistical issues  with finding someone to accompany her.     Very difficult for me to get her to stay on task and deal with 1 problem at a time.  She tends to get distracted easily and changes the subject to various issues she is dealing with. Most recent fall risk assessment:    06/21/2024    3:05 PM  Fall Risk   Falls in the past year? 0  Number falls in past yr: 0  Injury with Fall? 0  Risk for fall due to : Impaired mobility;Impaired balance/gait;Medication side effect  Follow up Falls prevention discussed;Falls evaluation completed     Most recent depression screenings:    06/21/2024    3:07 PM 06/16/2023    3:12 PM  PHQ 2/9 Scores  PHQ - 2 Score 4 1  PHQ- 9 Score 4  5      Data saved with a previous flowsheet row definition    Vision:Within last year  {History (Optional):23778}  Immunization History  Administered Date(s) Administered   Hepatitis B 08/13/2012, 09/10/2012   Hepatitis B, ADULT 09/28/2013   Moderna Covid-19 Vaccine Bivalent Booster 106yrs & up 11/07/2021   Moderna SARS-COV2 Booster Vaccination 11/08/2020   Moderna Sars-Covid-2 Vaccination 03/08/2020, 04/03/2020   PNEUMOCOCCAL CONJUGATE-20 10/11/2024   PPD Test 08/08/2014, 09/10/2015, 09/01/2016   Pfizer(Comirnaty)Fall Seasonal Vaccine 12 years and older 11/06/2022, 09/24/2023   Pneumococcal Conjugate-13 03/06/2017   Pneumococcal Polysaccharide-23 08/13/2012   Tdap 08/13/2012   Zoster Recombinant(Shingrix) 01/12/2017, 07/18/2017   Zoster, Live 10/10/2014    Health Maintenance  Topic Date Due   Colonoscopy  07/01/2021   DTaP/Tdap/Td (2 - Td or Tdap) 08/13/2022   Influenza Vaccine  Never done   COVID-19 Vaccine (7 - 2025-26 season) 08/01/2024   Diabetic kidney evaluation - eGFR measurement  09/23/2024   Diabetic kidney evaluation - Urine ACR  09/23/2024   FOOT EXAM  09/23/2024   HEMOGLOBIN A1C  04/10/2025   OPHTHALMOLOGY EXAM  06/02/2025   Medicare Annual Wellness (AWV)  06/21/2025   Mammogram  07/14/2026    Pneumococcal Vaccine: 50+ Years  Completed   DEXA SCAN  Completed   Hepatitis C Screening  Completed   Zoster Vaccines- Shingrix  Completed   Meningococcal B Vaccine  Aged Out   Hepatitis B Vaccines 19-59 Average Risk  Discontinued    Patient Care Team: Joyce Norleen BROCKS, MD as PCP - General (Family Medicine) Suzie Leader as [facility] Care Management [place], Jon DEL, RPH (Pharmacist) [facility], [facility]   Outpatient Medications Prior to Visit  Medication Sig Note   acetaminophen  (TYLENOL ) 500 MG tablet Take 1,000 mg by mouth every 6 (six) hours as needed for mild pain (pain score 1-3). 10/11/2024: Took 2 yesterday   albuterol  (PROVENTIL ) (2.5 MG/3ML) 0.083% nebulizer solution Take 3 mLs (2.5 mg total) by nebulization every 6 (six) hours as needed for wheezing or shortness of breath. 10/11/2024: As needed   Blood Glucose Monitoring Suppl (ACCU-CHEK AVIVA PLUS) w/Device KIT USE AS DIRECTED TO CHECK BLOOD GLUCOSE 1 TO 2 TIMES DAILY    diphenhydramine-acetaminophen  (TYLENOL  PM) 25-500 MG TABS tablet Take 1 tablet by mouth at bedtime as needed (sleep). 10/11/2024: As needed   EPINEPHrine  0.3 mg/0.3 mL IJ SOAJ injection Inject 0.3 mg into the muscle as needed for anaphylaxis.    ferrous sulfate 325 (65 FE) MG EC tablet Take 325 mg by mouth 3 (three) times daily with meals. 10/11/2024: Once daily   Fluticasone -Umeclidin-Vilant (TRELEGY ELLIPTA ) 200-62.5-25 MCG/ACT AEPB Take 1 Inhalation by mouth daily.    hydrochlorothiazide  (HYDRODIURIL ) 12.5 MG tablet Take 1 tablet by mouth once daily    ibuprofen  (ADVIL ) 800 MG tablet TAKE 1 TABLET BY MOUTH EVERY 8 HOURS AS NEEDED FOR MILD PAIN 10/11/2024: As needed   levalbuterol  (XOPENEX  HFA) 45 MCG/ACT inhaler INHALE 2 PUFFS BY MOUTH EVERY 4 HOURS AS NEEDED FOR WHEEZING    losartan  (COZAAR ) 25 MG tablet Take 1 tablet by mouth once daily    metFORMIN  (GLUCOPHAGE ) 500 MG tablet Take 1 tablet (500 mg total) by mouth 2 (two) times daily  with a meal. 10/11/2024: Sometimes only takes 1   methocarbamol  (ROBAXIN ) 500 MG tablet Take 1 tablet (500 mg total) by mouth 3 (three) times daily as needed for muscle spasms.    montelukast  (SINGULAIR ) 10 MG tablet Take 1 tablet (10 mg total) by mouth at bedtime.    MOUNJARO  5 MG/0.5ML Pen INJECT 5 MG INTO THE SKIN ONCE A WEEK    ondansetron  (ZOFRAN -ODT) 4 MG disintegrating tablet DISSOLVE 1 TABLET IN MOUTH EVERY 8 HOURS AS NEEDED FOR NAUSEA FOR VOMITING 10/11/2024: As needed   pantoprazole  (PROTONIX ) 40 MG tablet Take 1 tablet by mouth once daily    traMADol  (ULTRAM ) 50 MG tablet Take 1 tablet (50 mg total) by mouth 2 (two) times daily as needed.    furosemide  (LASIX ) 20 MG tablet Take 1 tablet (20 mg total) by mouth daily. (Patient not taking: Reported on 10/11/2024)    [DISCONTINUED] doxycycline  (VIBRA -TABS) 100 MG tablet Take 1 tablet (100 mg total) by mouth 2 (two) times daily. (Patient not  taking: Reported on 07/06/2024)    No facility-administered medications prior to visit.    Review of Systems  All other systems reviewed and are negative.   Family and social history as well as health maintenance and immunizations was reviewed.     Objective:    BP 122/68   Pulse 72   Ht 5' 5 (1.651 m)   Wt 206 lb 12.8 oz (93.8 kg)   BMI 34.41 kg/m  {Vitals History (Optional):23777}  Physical Exam  Alert and in no distress. Tympanic membranes and canals are normal. Pharyngeal area is normal. Neck is supple without adenopathy or thyromegaly. Cardiac exam shows a regular sinus rhythm without murmurs or gallops. Lungs are clear to auscultation.  {Show previous labs (optional):23779}    Assessment & Plan:       Routine general medical examination at a health care facility  - Administered pneumonia vaccine. - Ordered blood work.  Type 2 diabetes mellitus Managed with Mounjaro  and intermittent metformin . A1c is 5.7, indicating good control. Weight loss noted, but she prefers to maintain  current Mounjaro  dose. - Continue Mounjaro  5 mg. - Encouraged regular metformin  use.  Obesity Weight loss achieved with Mounjaro . Current weight is 206 lbs, down from 213 lbs in August. She is satisfied with current weight and prefers not to increase Mounjaro  dose. - Continue current Mounjaro  dose.  Asthma and allergic rhinitis Asthma managed with Trelegy and rescue inhaler. Allergic rhinitis symptoms are well-controlled with intermittent montelukast  use. She uses the rescue inhaler 1-3 times per week and the nebulizer occasionally for colds. - Continue Trelegy regularly. - Use rescue inhaler as needed, not more than twice a week. - Use nebulizer only if necessary.  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 Pain exacerbated by sitting and walking. Previous injections provided minimal relief. She is not following up with orthopedic surgeon due to lack of improvement. - Encouraged follow-up with orthopedic surgeon for further evaluation and management.  Gastroesophageal reflux disease (GERD) GERD managed with Protonix . - Continue Protonix .  Cataract extraction status She is due for follow-up with the eye doctor. - Encouraged follow-up with eye doctor.  Adenomatous colon polyp She has not yet scheduled a colonoscopy due to logistical issues. - Encouraged scheduling of colonoscopy with assistance for transportation.      History of cataract extraction, unspecified laterality  Hyperlipidemia associated with type 2 diabetes mellitus (HCC) - Plan: Lipid panel  Hypertension associated with diabetes (HCC) - Plan: CBC with Differential/Platelet, Comprehensive metabolic panel with GFR  Obesity (BMI 30-39.9)  Stage 3a chronic kidney disease (HCC) - Plan: CBC with Differential/Platelet, Comprehensive metabolic panel with GFR Psychologically I think she is mm Need for vaccination against Streptococcus pneumoniae - Plan:  Pneumococcal conjugate vaccine 20-valent (Prevnar 20)   Return in about 6 months (around 04/10/2025).      Norleen Jobs, MD

## 2024-10-12 ENCOUNTER — Ambulatory Visit: Payer: Self-pay | Admitting: Family Medicine

## 2024-10-12 LAB — COMPREHENSIVE METABOLIC PANEL WITH GFR
ALT: 12 IU/L (ref 0–32)
AST: 18 IU/L (ref 0–40)
Albumin: 4.6 g/dL (ref 3.9–4.9)
Alkaline Phosphatase: 55 IU/L (ref 49–135)
BUN/Creatinine Ratio: 21 (ref 12–28)
BUN: 24 mg/dL (ref 8–27)
Bilirubin Total: 0.4 mg/dL (ref 0.0–1.2)
CO2: 24 mmol/L (ref 20–29)
Calcium: 10.1 mg/dL (ref 8.7–10.3)
Chloride: 101 mmol/L (ref 96–106)
Creatinine, Ser: 1.14 mg/dL — AB (ref 0.57–1.00)
Globulin, Total: 2.9 g/dL (ref 1.5–4.5)
Glucose: 95 mg/dL (ref 70–99)
Potassium: 4.3 mmol/L (ref 3.5–5.2)
Sodium: 139 mmol/L (ref 134–144)
Total Protein: 7.5 g/dL (ref 6.0–8.5)
eGFR: 52 mL/min/1.73 — AB (ref >59)

## 2024-10-12 LAB — CBC WITH DIFFERENTIAL/PLATELET
Basophils Absolute: 0.1 x10E3/uL (ref 0.0–0.2)
Basos: 1 %
EOS (ABSOLUTE): 0.5 x10E3/uL — ABNORMAL HIGH (ref 0.0–0.4)
Eos: 5 %
Hematocrit: 37.8 % (ref 34.0–46.6)
Hemoglobin: 11.9 g/dL (ref 11.1–15.9)
Immature Grans (Abs): 0 x10E3/uL (ref 0.0–0.1)
Immature Granulocytes: 0 %
Lymphocytes Absolute: 2.9 x10E3/uL (ref 0.7–3.1)
Lymphs: 32 %
MCH: 29.2 pg (ref 26.6–33.0)
MCHC: 31.5 g/dL (ref 31.5–35.7)
MCV: 93 fL (ref 79–97)
Monocytes Absolute: 0.7 x10E3/uL (ref 0.1–0.9)
Monocytes: 8 %
Neutrophils Absolute: 4.8 x10E3/uL (ref 1.4–7.0)
Neutrophils: 54 %
Platelets: 392 x10E3/uL (ref 150–450)
RBC: 4.07 x10E6/uL (ref 3.77–5.28)
RDW: 13.1 % (ref 11.7–15.4)
WBC: 8.9 x10E3/uL (ref 3.4–10.8)

## 2024-10-12 LAB — LIPID PANEL
Cholesterol, Total: 119 mg/dL (ref 100–199)
HDL: 65 mg/dL (ref >39)
LDL CALC COMMENT:: 1.8 ratio (ref 0.0–4.4)
LDL Chol Calc (NIH): 37 mg/dL (ref 0–99)
Triglycerides: 91 mg/dL (ref 0–149)
VLDL Cholesterol Cal: 17 mg/dL (ref 5–40)

## 2024-10-16 ENCOUNTER — Other Ambulatory Visit: Payer: Self-pay | Admitting: Family Medicine

## 2024-10-16 DIAGNOSIS — I10 Essential (primary) hypertension: Secondary | ICD-10-CM

## 2024-10-19 ENCOUNTER — Encounter: Payer: Self-pay | Admitting: *Deleted

## 2024-10-19 NOTE — Progress Notes (Signed)
 [name]                                          MRN: [id number]   10/19/2024   The [facility] Quality Team Specialist reviewed this patient medical record for the purposes of chart review for care gap closure. The following were reviewed: abstraction for care gap closure-glycemic status assessment.    VBCI Quality Team

## 2024-10-20 ENCOUNTER — Other Ambulatory Visit: Payer: Self-pay | Admitting: Family Medicine

## 2024-10-20 DIAGNOSIS — J441 Chronic obstructive pulmonary disease with (acute) exacerbation: Secondary | ICD-10-CM

## 2024-10-20 DIAGNOSIS — E118 Type 2 diabetes mellitus with unspecified complications: Secondary | ICD-10-CM

## 2024-10-20 NOTE — Telephone Encounter (Signed)
 Are these okay to refill?

## 2024-11-04 ENCOUNTER — Other Ambulatory Visit: Payer: Self-pay | Admitting: Family Medicine

## 2024-11-04 DIAGNOSIS — I10 Essential (primary) hypertension: Secondary | ICD-10-CM

## 2024-11-05 ENCOUNTER — Other Ambulatory Visit: Payer: Self-pay | Admitting: Family Medicine

## 2024-11-05 DIAGNOSIS — I10 Essential (primary) hypertension: Secondary | ICD-10-CM

## 2024-11-07 ENCOUNTER — Telehealth: Payer: Self-pay | Admitting: Family Medicine

## 2024-11-07 DIAGNOSIS — I10 Essential (primary) hypertension: Secondary | ICD-10-CM

## 2024-11-07 NOTE — Telephone Encounter (Unsigned)
 Copied from CRM [id number]. Topic: Clinical - Medication Refill >> Nov 07, 2024  1:39 PM Amy B wrote: Medication: hydrochlorothiazide  (HYDRODIURIL ) 12.5 MG tablet  Has the patient contacted their pharmacy? Yes (Agent: If no, request that the patient contact the pharmacy for the refill. If patient does not wish to contact the pharmacy document the reason why and proceed with request.) (Agent: If yes, when and what did the pharmacy advise?)  This is the patient's preferred pharmacy:  [id number] - [place] ([place]), [place] - [address] [place] ([place]) [place] [address] Phone: [phone] Fax: [phone]  Is this the correct pharmacy for this prescription? Yes If no, delete pharmacy and type the correct one.   Has the prescription been filled recently? No  Is the patient out of the medication? Yes  Has the patient been seen for an appointment in the last year OR does the patient have an upcoming appointment? Yes  Can we respond through MyChart? Yes  Agent: Please be advised that Rx refills may take up to 3 business days. We ask that you follow-up with your pharmacy.

## 2024-11-09 NOTE — Telephone Encounter (Signed)
 This was refilled on 11/04/24

## 2024-11-16 ENCOUNTER — Other Ambulatory Visit: Payer: Self-pay | Admitting: Family Medicine

## 2024-11-16 DIAGNOSIS — J441 Chronic obstructive pulmonary disease with (acute) exacerbation: Secondary | ICD-10-CM

## 2024-11-22 ENCOUNTER — Other Ambulatory Visit (INDEPENDENT_AMBULATORY_CARE_PROVIDER_SITE_OTHER)

## 2024-11-22 ENCOUNTER — Ambulatory Visit: Admitting: Family Medicine

## 2024-11-22 DIAGNOSIS — Z23 Encounter for immunization: Secondary | ICD-10-CM

## 2024-11-30 ENCOUNTER — Ambulatory Visit: Admitting: Physician Assistant

## 2024-11-30 DIAGNOSIS — M5441 Lumbago with sciatica, right side: Secondary | ICD-10-CM | POA: Diagnosis not present

## 2024-11-30 DIAGNOSIS — G8929 Other chronic pain: Secondary | ICD-10-CM | POA: Diagnosis not present

## 2024-11-30 DIAGNOSIS — M5442 Lumbago with sciatica, left side: Secondary | ICD-10-CM

## 2024-11-30 MED ORDER — METHYLPREDNISOLONE 4 MG PO TBPK: ORAL_TABLET | ORAL | 0 refills | Status: DC

## 2024-11-30 MED ORDER — METHOCARBAMOL 500 MG PO TABS: 500.0000 mg | ORAL_TABLET | Freq: Two times a day (BID) | ORAL | 0 refills | Status: AC | PRN

## 2024-11-30 NOTE — Progress Notes (Signed)
 "  Office Visit Note   Patient: [name]           Date of Birth: [birth year]           MRN: [id number] Visit Date: 11/30/2024              Requested by: Joyce Norleen BROCKS, MD [address] [place],  [place] [address] PCP: Joyce Norleen BROCKS, MD   Assessment & Plan: Visit Diagnoses:  1. Chronic bilateral low back pain with bilateral sciatica     Plan: Impression is chronic bilateral back pain with lower extremity radiculopathy.  Patient does note she has had good relief from aquatic therapy in the past and would like to return to the location on [address] to see if this will help relieve her symptoms.  I am agreeable to this plan and have sent in a referral.  Have also sent in a Medrol  Dosepak and a muscle relaxer.  If her symptoms do not improve or worsen, I have discussed follow-up with Megan or Dr. Georgina for further evaluation treat recommendation.  Follow-Up Instructions: Return if symptoms worsen or fail to improve.   Orders:  Orders Placed This Encounter  Procedures   Ambulatory referral to Physical Therapy   Meds ordered this encounter  Medications   methylPREDNISolone  (MEDROL  DOSEPAK) 4 MG TBPK tablet    Sig: Take as directed    Dispense:  21 tablet    Refill:  0   methocarbamol  (ROBAXIN ) 500 MG tablet    Sig: Take 1 tablet (500 mg total) by mouth 2 (two) times daily as needed.    Dispense:  10 tablet    Refill:  0      Procedures: No procedures performed   Clinical Data: No additional findings.   Subjective: Chief Complaint  Patient presents with   Lower Back - Pain    HPI patient is a pleasant 68 year old female who comes in today with chronic bilateral buttock pain.  She has a history of moderate to severe canal stenosis L4-5 secondary to disc bulging and facet arthropathy as well as severe bilateral foraminal stenosis at L4-5.  She has been seeing Megan as well as Dr. Eldonna for this where she has been to physical therapy as well as  undergone ESI most recently L4-5.  She tells me she did not have a good experience with this and did not really notice much relief.  She is here today with continued pain.  The pain primarily is in both buttock cheeks and radiates into the back of the thighs.  Pain is constant but worse when she is walking too much.  She does note occasional weakness in her legs.  She has been using heating pad, TENS unit, ibuprofen  and Tylenol  without relief.  She denies any paresthesias or bowel or bladder incontinence.  Review of Systems as detailed in HPI.  All others reviewed and are negative.   Objective: Vital Signs: There were no vitals taken for this visit.  Physical Exam well-developed well-nourished female in no acute distress.  Alert and oriented x 3.  Ortho Exam lumbar spine exam: No spinous or paraspinous tenderness.  She does have increased pain with lumbar spine extension.  Mildly positive straight leg raise both sides.  No focal weakness.  She is neurovascularly intact distally.  Specialty Comments:  CLINICAL DATA:  Low back pain   EXAM: MRI LUMBAR SPINE WITHOUT CONTRAST   TECHNIQUE: Multiplanar, multisequence MR imaging of the lumbar spine was  performed. No intravenous contrast was administered.   COMPARISON:  MRI of the lumbar spine dated 01/20/2021   FINDINGS: Segmentation: Standard.   Alignment:  Grade 1 anterolisthesis of L4 on L5.   Vertebrae: Vertebral bodies demonstrate normal signal intensity. No compression fractures.   Conus medullaris and cauda equina: The conus medullaris terminates at the level of L1-L2. The distal spinal cord signal intensity is normal.   Paraspinal and other soft tissues: The visualized abdomen and pelvis show no soft tissue abnormality. The visualized aorta is normal.   Disc levels:   L1-L2: Disc is normal in configuration. Mild bilateral facet arthropathy. No neuroforaminal stenosis. No spinal canal stenosis.   L2-L3: Disc is normal in  configuration. Mild bilateral facet arthropathy. No neuroforaminal stenosis. No spinal canal stenosis.   L3-L4: Disc is normal in configuration. Mild bilateral facet arthropathy. No neuroforaminal stenosis. No spinal canal stenosis.   L4-L5: Disc bulge. Severe bilateral facet arthropathy. Severe bilateral neuroforaminal stenosis. Moderate to severe spinal canal stenosis.   L5-S1: Disc bulge. Severe bilateral facet arthropathy. Severe bilateral neuroforaminal stenosis. Mild spinal canal stenosis.   IMPRESSION: 1. Moderate to severe canal stenosis at L4-L5 secondary to disc bulging and facet arthropathy. 2. Severe bilateral foraminal stenosis at L4-L5 and L5-S secondary to disc bulging and facet arthropathy.     Electronically Signed   By: Clem Savory M.D.   On: 04/05/2024 09:55  Imaging: No results found.   PMFS History: Patient Active Problem List   Diagnosis Date Noted   Hyperlipidemia associated with type 2 diabetes mellitus (HCC) 10/03/2024   Myopathy, unspecified 07/15/2023   COPD (chronic obstructive pulmonary disease) (HCC) 11/30/2022   Stage 3a chronic kidney disease (HCC) 01/23/2021   History of cataract surgery 04/27/2020   Multiple somatic complaints 01/10/2019   Adenomatous polyp of colon 05/25/2018   Asthma, chronic, mild persistent, uncomplicated 03/06/2017   Chronic seasonal allergic rhinitis due to pollen 03/06/2017   Gastroesophageal reflux disease without esophagitis 10/10/2014   Ex-cigarette smoker 09/28/2013   Type 2 diabetes mellitus with complications (HCC) 07/12/2012   Obesity (BMI 30-39.9) 07/12/2012   Chronic back pain 07/12/2012   Hypertension associated with diabetes (HCC) 07/12/2012   Past Medical History:  Diagnosis Date   Arthritis    Asthma    Colonic polyp    GERD (gastroesophageal reflux disease)    Hypertension    Obesity    Pain in limb    Smoker    Spinal stenosis    Swelling of limb     Family History  Problem Relation  Age of Onset   Lung cancer Father    Cirrhosis Mother    Cirrhosis Brother    Colon cancer Neg Hx    Colon polyps Neg Hx    Esophageal cancer Neg Hx    Rectal cancer Neg Hx    Stomach cancer Neg Hx     Past Surgical History:  Procedure Laterality Date   BIOPSY BREAST Left 09/22/2018   no maglignancy   CATARACT EXTRACTION Right 05/25/2023   COLONOSCOPY  2009   Dr. Abran   POLYPECTOMY     Social History   Occupational History   Not on file  Tobacco Use   Smoking status: Former    Current packs/day: 0.25    Average packs/day: 0.3 packs/day for 10.0 years (2.5 ttl pk-yrs)    Types: Cigarettes   Smokeless tobacco: Never   Tobacco comments:    quit oct 2018  Vaping Use   Vaping  status: Never Used  Substance and Sexual Activity   Alcohol use: Not Currently    Comment: socially   Drug use: Not Currently    Types: Marijuana    Comment: social   Sexual activity: Not Currently        "

## 2024-12-04 ENCOUNTER — Other Ambulatory Visit: Payer: Self-pay | Admitting: Family Medicine

## 2024-12-04 DIAGNOSIS — I10 Essential (primary) hypertension: Secondary | ICD-10-CM

## 2024-12-08 ENCOUNTER — Telehealth: Payer: Self-pay | Admitting: Physician Assistant

## 2024-12-08 NOTE — Telephone Encounter (Signed)
 Patient called. Would like prednisone  called in for her. [phone]

## 2024-12-09 ENCOUNTER — Other Ambulatory Visit: Payer: Self-pay | Admitting: Physician Assistant

## 2024-12-09 MED ORDER — METHYLPREDNISOLONE 4 MG PO TBPK: ORAL_TABLET | ORAL | 0 refills | Status: AC

## 2024-12-09 NOTE — Telephone Encounter (Signed)
 I have sent in one more but will need to see [name]/[name] if continues to have back pain/needs medicine for back

## 2024-12-09 NOTE — Telephone Encounter (Signed)
 Notified patient via MyChart

## 2024-12-15 ENCOUNTER — Other Ambulatory Visit: Payer: Self-pay

## 2024-12-15 ENCOUNTER — Ambulatory Visit: Attending: Physician Assistant

## 2024-12-15 DIAGNOSIS — M5459 Other low back pain: Secondary | ICD-10-CM | POA: Insufficient documentation

## 2024-12-15 DIAGNOSIS — M6281 Muscle weakness (generalized): Secondary | ICD-10-CM | POA: Diagnosis present

## 2024-12-15 DIAGNOSIS — M5442 Lumbago with sciatica, left side: Secondary | ICD-10-CM | POA: Diagnosis not present

## 2024-12-15 DIAGNOSIS — G8929 Other chronic pain: Secondary | ICD-10-CM | POA: Insufficient documentation

## 2024-12-15 DIAGNOSIS — M5441 Lumbago with sciatica, right side: Secondary | ICD-10-CM | POA: Diagnosis not present

## 2024-12-15 DIAGNOSIS — R2689 Other abnormalities of gait and mobility: Secondary | ICD-10-CM | POA: Insufficient documentation

## 2024-12-15 NOTE — Therapy (Signed)
 " OUTPATIENT PHYSICAL THERAPY THORACOLUMBAR EVALUATION   Patient Name: [name] MRN: [id number] DOB:[birth year], 69 y.o., female Today's Date: 12/16/2024  END OF SESSION:  PT End of Session - 12/16/24 1251     Visit Number 1    Number of Visits 17    Date for Recertification  02/10/25    Authorization Type [employer] MEDICARE; [place] [facility]    PT Start Time 1510    PT Stop Time 1610    PT Time Calculation (min) 60 min    Activity Tolerance Patient tolerated treatment well    Behavior During Therapy WFL for tasks assessed/performed          Past Medical History:  Diagnosis Date   Arthritis    Asthma    Colonic polyp    GERD (gastroesophageal reflux disease)    Hypertension    Obesity    Pain in limb    Smoker    Spinal stenosis    Swelling of limb    Past Surgical History:  Procedure Laterality Date   BIOPSY BREAST Left 09/22/2018   no maglignancy   CATARACT EXTRACTION Right 05/25/2023   COLONOSCOPY  2009   Dr. Abran   POLYPECTOMY     Patient Active Problem List   Diagnosis Date Noted   Hyperlipidemia associated with type 2 diabetes mellitus (HCC) 10/03/2024   Myopathy, unspecified 07/15/2023   COPD (chronic obstructive pulmonary disease) (HCC) 11/30/2022   Stage 3a chronic kidney disease (HCC) 01/23/2021   History of cataract surgery 04/27/2020   Multiple somatic complaints 01/10/2019   Adenomatous polyp of colon 05/25/2018   Asthma, chronic, mild persistent, uncomplicated 03/06/2017   Chronic seasonal allergic rhinitis due to pollen 03/06/2017   Gastroesophageal reflux disease without esophagitis 10/10/2014   Ex-cigarette smoker 09/28/2013   Type 2 diabetes mellitus with complications (HCC) 07/12/2012   Obesity (BMI 30-39.9) 07/12/2012   Chronic back pain 07/12/2012   Hypertension associated with diabetes (HCC) 07/12/2012    PCP: Joyce Norleen BROCKS, MD   REFERRING PROVIDER: Jule Ronal CROME, PA-C   REFERRING DIAG:  M54.42,M54.41,G89.29 (ICD-10-CM) - Chronic bilateral low back pain with bilateral sciatica   Rationale for Evaluation and Treatment: Rehabilitation  THERAPY DIAG:  Other low back pain  Muscle weakness (generalized)  Other abnormalities of gait and mobility  ONSET DATE: Chronic  SUBJECTIVE:                                                                                                                                                                                           SUBJECTIVE STATEMENT: Pt presents to PT with reports of chronic  LBP that refers into bilateral hips, R>L, during standing or walking. She is well known to therapist, of note though she has lost 65 pounds over the last few months since starting GLP-1. Pt notes overall decrease in LBP compared to previous therapy episodes and that overall she feels she is doing the best she has been in a while. She wants to really get stronger and more mobile now that she is doing better, although she continues to have pain. After her last ESI she had a small bought of paresis in LE although it resolved quickly.  PERTINENT HISTORY:  DM II, CKD, COPD, HTN  PAIN:  Are you having pain?  Yes: NPRS scale: 6/10 Worst: 8/10 Pain location: lower back, posterior hip  Pain description: sharp, sore Aggravating factors: walking, standing Relieving factors: rest, medication  PRECAUTIONS: None  RED FLAGS: None   WEIGHT BEARING RESTRICTIONS: No  FALLS:  Has patient fallen in last 6 months? 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  DIAGNOSTIC FINDINGS:  See imaging  PATIENT SURVEYS:  Modified Oswestry:  MODIFIED OSWESTRY DISABILITY SCALE  Date: 12/15/24 Score  Pain intensity   2. Personal care (washing, dressing, etc.)   3. Lifting   4. Walking   5. Sitting   6. Standing   7. Sleeping   8. Social Life   9. Traveling   10. Employment/ Homemaking   Total 30/50   Interpretation of scores: Score Category Description  0-20% Minimal Disability The patient can cope with most living activities. Usually no treatment is indicated apart from advice on lifting, sitting and exercise  21-40% Moderate Disability The patient experiences more pain and difficulty with sitting, lifting and standing. Travel and social life are more difficult and they may be disabled from work. Personal care, sexual activity and sleeping are not grossly affected, and the patient can usually be managed by conservative means  41-60% Severe Disability Pain remains the main problem in this group, but activities of daily living are affected. These patients require a detailed investigation  61-80% Crippled Back pain impinges on all aspects of the patients life. Positive intervention is required  81-100% Bed-bound These patients are either bed-bound or exaggerating their symptoms  Bluford FORBES Zoe DELENA Karon DELENA, et al. Surgery versus conservative management of stable thoracolumbar fracture: the PRESTO feasibility RCT. [place] ([place]): [employer]; 2021 Nov. [employer] Technology Assessment, No. 25.62.) Appendix 3, Oswestry Disability Index category descriptors. Available from: Findjewelers.cz  Minimally Clinically Important Difference (MCID) = 12.8%  COGNITION: Overall cognitive status: Within functional limits for tasks assessed   SENSATION: WFL  MUSCLE LENGTH: Thomas test: Right + deg; Left + deg  POSTURE: rounded shoulders and forward head  PALPATION: TTP to lumbar paraspinals  LUMBAR ROM:    AROM eval  Flexion 100%  Extension 25%  Right lateral flexion   Left lateral flexion   Right rotation   Left rotation    (Blank rows = not tested)  LOWER EXTREMITY MMT:    MMT Right eval Left eval  Hip flexion 3+ 3+  Hip extension 3+ 3+  Hip abduction 4 4  Hip  adduction 4 4  Hip internal rotation    Hip external rotation    Knee flexion    Knee extension    Ankle dorsiflexion    Ankle plantarflexion    Ankle inversion    Ankle eversion     (Blank rows = not tested)  LUMBAR SPECIAL TESTS:  DNT  FUNCTIONAL TESTS:  30 Second Sit to Stand: 10 reps no UE  GAIT: Distance walked: 35ft Assistive device utilized: SPC Level of assistance: Complete Independence Comments: flexed trunk, decreased gait speed  TREATMENT: [facility] Adult PT Treatment:                                                DATE: 12/15/2024 Therapeutic Exercise: Repeated flexion in sitting x 10 LTR x 5  Bridge x 5  SLR x 5 each  PATIENT EDUCATION:  Education details: eval findings, ODI, HEP, POC Person educated: Patient Education method: Explanation, Demonstration, and Handouts Education comprehension: verbalized understanding and returned demonstration  HOME EXERCISE PROGRAM: Access Code: [email or web address] URL: [email or web address] Date: 12/15/2024 Prepared by: Alm Kingdom  Exercises - Seated Flexion Stretch with Swiss Ball  - 1 x daily - 7 x weekly - 2 sets - 10 reps - 5 sec hold - Supine Lower Trunk Rotation  - 1 x daily - 7 x weekly - 2 sets - 10 reps - 5 sec hold - Beginner Bridge  - 1 x daily - 7 x weekly - 2 sets - 10 reps - Active Straight Leg Raise with Quad Set  - 1 x daily - 7 x weekly - 2 sets - 10 reps  ASSESSMENT:  CLINICAL IMPRESSION: Patient is a 69 y.o. F who was seen today for physical therapy evaluation and treatment for chronic LBP and discomfort. Physical findings are consistent with referring provider impression as pt demonstrates core/hip and extensor weakness,  decreased lumbar mobility, and general decrease in functional mobility. ODI score shows severe disability in performance of home ADLs and community activities. Pt would benefit from skilled PT services working on improving strength and functional mobility with gait and other activities to improve comfort and function.   OBJECTIVE IMPAIRMENTS: decreased activity tolerance, decreased mobility, difficulty walking, decreased ROM, decreased strength, postural dysfunction, and pain   ACTIVITY LIMITATIONS: carrying, lifting, sitting, standing, squatting, stairs, transfers, and locomotion level  PARTICIPATION LIMITATIONS: meal prep, cleaning, driving, shopping, and community activity  PERSONAL FACTORS: Time since onset of injury/illness/exacerbation and 3+ comorbidities: DM II, CKD, COPD, HTN are also affecting patient's functional outcome.   REHAB POTENTIAL: Good  CLINICAL DECISION MAKING: Evolving/moderate complexity  EVALUATION COMPLEXITY: Moderate   GOALS: Goals reviewed with patient? No  SHORT TERM GOALS: Target date: 01/05/2025   Pt will be compliant and knowledgeable with initial HEP for improved comfort and carryover Baseline: initial HEP given  Goal status: INITIAL  2.  Pt will self report low back pain no greater than 6/10 for improved comfort and functional ability Baseline: 8/10 at worst Goal status: INITIAL   LONG TERM GOALS: Target date: 02/10/2025   Pt will be decrease ODI disability score to no greater than 40% as proxy for functional improvement Baseline: 60% disability  Goal status: INITIAL  2.  Pt will self report low back pain no greater than 4/10 for improved comfort and functional ability Baseline: 8/10 at worst Goal status: INITIAL   3.  Pt will increase 30 Second Sit to Stand rep count to no less than 12 reps for improved balance, strength, and functional mobility Baseline: 10 reps  Goal status: INITIAL   4.  Pt will be able to increase LE MMT to at least 5/5  for all tested motions for improved comfort and function Baseline: see chart Goal status: INITIAL   PLAN:  PT FREQUENCY: 2x/week  PT DURATION: 8 weeks  PLANNED INTERVENTIONS: [id number]- PT Re-evaluation, 97110-Therapeutic exercises, 97530- Therapeutic activity, 97112- Neuromuscular re-education, 97535- Self Care, [id number]- Manual therapy, [id number]- Gait training, [phone]- Aquatic Therapy, [phone]- Electrical stimulation (unattended), [phone]- Electrical stimulation (manual), and Patient/Family education  PLAN FOR NEXT SESSION: assess HEP response, LE strengthening, flexion based stretching for lumbar spine, hip flexor stretching    Alm JAYSON Kingdom, PT 12/16/2024, 1:06 PM  "

## 2024-12-22 ENCOUNTER — Other Ambulatory Visit: Payer: Self-pay | Admitting: Family Medicine

## 2024-12-22 DIAGNOSIS — J441 Chronic obstructive pulmonary disease with (acute) exacerbation: Secondary | ICD-10-CM

## 2024-12-26 ENCOUNTER — Ambulatory Visit

## 2024-12-28 ENCOUNTER — Ambulatory Visit

## 2025-01-01 ENCOUNTER — Other Ambulatory Visit: Payer: Self-pay | Admitting: Family Medicine

## 2025-01-01 DIAGNOSIS — I10 Essential (primary) hypertension: Secondary | ICD-10-CM

## 2025-01-02 ENCOUNTER — Ambulatory Visit

## 2025-01-03 ENCOUNTER — Other Ambulatory Visit: Payer: Self-pay | Admitting: Family Medicine

## 2025-01-04 ENCOUNTER — Ambulatory Visit

## 2025-01-10 ENCOUNTER — Ambulatory Visit

## 2025-01-12 ENCOUNTER — Ambulatory Visit

## 2025-01-19 ENCOUNTER — Ambulatory Visit

## 2025-01-26 ENCOUNTER — Ambulatory Visit

## 2025-01-30 ENCOUNTER — Ambulatory Visit

## 2025-06-27 ENCOUNTER — Ambulatory Visit: Payer: Self-pay
